# Patient Record
Sex: Female | Born: 1935 | Race: Black or African American | Hispanic: No | Smoking: Former smoker
Health system: Southern US, Community
[De-identification: ages and names within clinical notes are randomized; demographics above are authoritative.]

## PROBLEM LIST (undated history)

## (undated) DIAGNOSIS — E119 Type 2 diabetes mellitus without complications: Secondary | ICD-10-CM

## (undated) DIAGNOSIS — N184 Chronic kidney disease, stage 4 (severe): Secondary | ICD-10-CM

## (undated) DIAGNOSIS — M1990 Unspecified osteoarthritis, unspecified site: Secondary | ICD-10-CM

## (undated) DIAGNOSIS — C801 Malignant (primary) neoplasm, unspecified: Secondary | ICD-10-CM

## (undated) DIAGNOSIS — IMO0002 Reserved for concepts with insufficient information to code with codable children: Secondary | ICD-10-CM

## (undated) DIAGNOSIS — I10 Essential (primary) hypertension: Secondary | ICD-10-CM

## (undated) DIAGNOSIS — E079 Disorder of thyroid, unspecified: Secondary | ICD-10-CM

## (undated) DIAGNOSIS — G4730 Sleep apnea, unspecified: Secondary | ICD-10-CM

## (undated) DIAGNOSIS — F329 Major depressive disorder, single episode, unspecified: Secondary | ICD-10-CM

## (undated) DIAGNOSIS — F32A Depression, unspecified: Secondary | ICD-10-CM

## (undated) HISTORY — DX: Depression, unspecified: F32.A

## (undated) HISTORY — PX: FLEXIBLE SIGMOIDOSCOPY: SHX1649

## (undated) HISTORY — DX: Essential (primary) hypertension: I10

## (undated) HISTORY — DX: Disorder of thyroid, unspecified: E07.9

## (undated) HISTORY — DX: Reserved for concepts with insufficient information to code with codable children: IMO0002

## (undated) HISTORY — PX: COLONOSCOPY: SHX174

## (undated) HISTORY — DX: Major depressive disorder, single episode, unspecified: F32.9

## (undated) HISTORY — DX: Type 2 diabetes mellitus without complications: E11.9

## (undated) HISTORY — PX: CATARACT EXTRACTION: SUR2

## (undated) HISTORY — DX: Chronic kidney disease, stage 4 (severe): N18.4

## (undated) HISTORY — PX: POLYPECTOMY: SHX149

## (undated) HISTORY — DX: Malignant (primary) neoplasm, unspecified: C80.1

## (undated) HISTORY — DX: Sleep apnea, unspecified: G47.30

## (undated) HISTORY — DX: Unspecified osteoarthritis, unspecified site: M19.90

---

## 1997-05-20 ENCOUNTER — Ambulatory Visit (HOSPITAL_COMMUNITY): Admission: RE | Admit: 1997-05-20 | Discharge: 1997-05-20 | Payer: Self-pay | Admitting: Obstetrics and Gynecology

## 1998-01-07 ENCOUNTER — Ambulatory Visit (HOSPITAL_COMMUNITY): Admission: RE | Admit: 1998-01-07 | Discharge: 1998-01-07 | Payer: Self-pay | Admitting: Internal Medicine

## 1998-01-07 ENCOUNTER — Encounter: Payer: Self-pay | Admitting: Internal Medicine

## 1998-03-15 ENCOUNTER — Other Ambulatory Visit: Admission: RE | Admit: 1998-03-15 | Discharge: 1998-03-15 | Payer: Self-pay | Admitting: Obstetrics and Gynecology

## 1998-10-27 ENCOUNTER — Encounter: Payer: Self-pay | Admitting: Obstetrics and Gynecology

## 1998-10-27 ENCOUNTER — Ambulatory Visit (HOSPITAL_COMMUNITY): Admission: RE | Admit: 1998-10-27 | Discharge: 1998-10-27 | Payer: Self-pay | Admitting: Obstetrics and Gynecology

## 1999-03-21 ENCOUNTER — Other Ambulatory Visit: Admission: RE | Admit: 1999-03-21 | Discharge: 1999-03-21 | Payer: Self-pay | Admitting: Obstetrics and Gynecology

## 1999-11-17 ENCOUNTER — Encounter: Admission: RE | Admit: 1999-11-17 | Discharge: 2000-02-15 | Payer: Self-pay | Admitting: Internal Medicine

## 1999-11-23 ENCOUNTER — Encounter: Payer: Self-pay | Admitting: Obstetrics and Gynecology

## 1999-11-23 ENCOUNTER — Ambulatory Visit (HOSPITAL_COMMUNITY): Admission: RE | Admit: 1999-11-23 | Discharge: 1999-11-23 | Payer: Self-pay | Admitting: Obstetrics and Gynecology

## 2000-05-14 ENCOUNTER — Other Ambulatory Visit: Admission: RE | Admit: 2000-05-14 | Discharge: 2000-05-14 | Payer: Self-pay | Admitting: Orthopedic Surgery

## 2000-11-26 ENCOUNTER — Encounter: Payer: Self-pay | Admitting: Obstetrics and Gynecology

## 2000-11-26 ENCOUNTER — Ambulatory Visit (HOSPITAL_COMMUNITY): Admission: RE | Admit: 2000-11-26 | Discharge: 2000-11-26 | Payer: Self-pay | Admitting: Obstetrics and Gynecology

## 2000-12-17 ENCOUNTER — Other Ambulatory Visit: Admission: RE | Admit: 2000-12-17 | Discharge: 2000-12-17 | Payer: Self-pay | Admitting: Obstetrics and Gynecology

## 2001-04-18 ENCOUNTER — Encounter: Payer: Self-pay | Admitting: Internal Medicine

## 2001-04-18 ENCOUNTER — Encounter: Admission: RE | Admit: 2001-04-18 | Discharge: 2001-04-18 | Payer: Self-pay | Admitting: Internal Medicine

## 2001-12-03 ENCOUNTER — Encounter: Payer: Self-pay | Admitting: Obstetrics and Gynecology

## 2001-12-03 ENCOUNTER — Ambulatory Visit (HOSPITAL_COMMUNITY): Admission: RE | Admit: 2001-12-03 | Discharge: 2001-12-03 | Payer: Self-pay | Admitting: Obstetrics and Gynecology

## 2001-12-17 ENCOUNTER — Encounter (INDEPENDENT_AMBULATORY_CARE_PROVIDER_SITE_OTHER): Payer: Self-pay | Admitting: *Deleted

## 2001-12-17 ENCOUNTER — Ambulatory Visit (HOSPITAL_COMMUNITY): Admission: RE | Admit: 2001-12-17 | Discharge: 2001-12-17 | Payer: Self-pay | Admitting: Obstetrics and Gynecology

## 2002-01-30 DIAGNOSIS — C801 Malignant (primary) neoplasm, unspecified: Secondary | ICD-10-CM

## 2002-01-30 HISTORY — DX: Malignant (primary) neoplasm, unspecified: C80.1

## 2002-06-12 ENCOUNTER — Encounter: Admission: RE | Admit: 2002-06-12 | Discharge: 2002-06-12 | Payer: Self-pay | Admitting: *Deleted

## 2002-06-12 ENCOUNTER — Encounter: Payer: Self-pay | Admitting: *Deleted

## 2002-07-08 ENCOUNTER — Other Ambulatory Visit: Admission: RE | Admit: 2002-07-08 | Discharge: 2002-07-08 | Payer: Self-pay | Admitting: Obstetrics and Gynecology

## 2002-07-28 ENCOUNTER — Encounter: Payer: Self-pay | Admitting: Gastroenterology

## 2002-08-05 ENCOUNTER — Encounter: Payer: Self-pay | Admitting: Gastroenterology

## 2002-08-05 ENCOUNTER — Encounter: Admission: RE | Admit: 2002-08-05 | Discharge: 2002-08-05 | Payer: Self-pay | Admitting: Gastroenterology

## 2002-08-26 ENCOUNTER — Ambulatory Visit (HOSPITAL_COMMUNITY): Admission: RE | Admit: 2002-08-26 | Discharge: 2002-08-26 | Payer: Self-pay | Admitting: General Surgery

## 2002-09-26 ENCOUNTER — Ambulatory Visit (HOSPITAL_COMMUNITY): Admission: RE | Admit: 2002-09-26 | Discharge: 2002-09-26 | Payer: Self-pay

## 2002-12-08 ENCOUNTER — Ambulatory Visit (HOSPITAL_COMMUNITY): Admission: RE | Admit: 2002-12-08 | Discharge: 2002-12-08 | Payer: Self-pay | Admitting: Obstetrics and Gynecology

## 2003-07-14 ENCOUNTER — Other Ambulatory Visit: Admission: RE | Admit: 2003-07-14 | Discharge: 2003-07-14 | Payer: Self-pay | Admitting: Obstetrics and Gynecology

## 2003-12-31 ENCOUNTER — Ambulatory Visit (HOSPITAL_COMMUNITY): Admission: RE | Admit: 2003-12-31 | Discharge: 2003-12-31 | Payer: Self-pay | Admitting: Obstetrics and Gynecology

## 2004-09-05 ENCOUNTER — Other Ambulatory Visit: Admission: RE | Admit: 2004-09-05 | Discharge: 2004-09-05 | Payer: Self-pay | Admitting: Obstetrics and Gynecology

## 2004-12-13 ENCOUNTER — Encounter: Admission: RE | Admit: 2004-12-13 | Discharge: 2004-12-13 | Payer: Self-pay | Admitting: Internal Medicine

## 2005-01-10 ENCOUNTER — Ambulatory Visit (HOSPITAL_COMMUNITY): Admission: RE | Admit: 2005-01-10 | Discharge: 2005-01-10 | Payer: Self-pay | Admitting: Obstetrics and Gynecology

## 2005-09-28 ENCOUNTER — Ambulatory Visit: Payer: Self-pay | Admitting: Gastroenterology

## 2005-10-23 ENCOUNTER — Encounter: Payer: Self-pay | Admitting: Gastroenterology

## 2005-10-23 ENCOUNTER — Ambulatory Visit: Payer: Self-pay | Admitting: Gastroenterology

## 2005-10-31 ENCOUNTER — Other Ambulatory Visit: Admission: RE | Admit: 2005-10-31 | Discharge: 2005-10-31 | Payer: Self-pay | Admitting: Obstetrics and Gynecology

## 2006-02-13 ENCOUNTER — Encounter: Admission: RE | Admit: 2006-02-13 | Discharge: 2006-02-13 | Payer: Self-pay | Admitting: Obstetrics and Gynecology

## 2006-02-27 ENCOUNTER — Encounter: Admission: RE | Admit: 2006-02-27 | Discharge: 2006-02-27 | Payer: Self-pay | Admitting: *Deleted

## 2007-03-11 ENCOUNTER — Encounter: Admission: RE | Admit: 2007-03-11 | Discharge: 2007-03-11 | Payer: Self-pay | Admitting: Obstetrics and Gynecology

## 2007-09-30 ENCOUNTER — Other Ambulatory Visit: Admission: RE | Admit: 2007-09-30 | Discharge: 2007-09-30 | Payer: Self-pay | Admitting: Family Medicine

## 2007-11-07 ENCOUNTER — Encounter: Payer: Self-pay | Admitting: Gastroenterology

## 2007-12-09 DIAGNOSIS — K219 Gastro-esophageal reflux disease without esophagitis: Secondary | ICD-10-CM

## 2007-12-09 DIAGNOSIS — C20 Malignant neoplasm of rectum: Secondary | ICD-10-CM

## 2007-12-09 DIAGNOSIS — K449 Diaphragmatic hernia without obstruction or gangrene: Secondary | ICD-10-CM | POA: Insufficient documentation

## 2007-12-10 ENCOUNTER — Ambulatory Visit: Payer: Self-pay | Admitting: Gastroenterology

## 2007-12-10 DIAGNOSIS — J4489 Other specified chronic obstructive pulmonary disease: Secondary | ICD-10-CM | POA: Insufficient documentation

## 2007-12-10 DIAGNOSIS — R195 Other fecal abnormalities: Secondary | ICD-10-CM | POA: Insufficient documentation

## 2007-12-10 DIAGNOSIS — I359 Nonrheumatic aortic valve disorder, unspecified: Secondary | ICD-10-CM | POA: Insufficient documentation

## 2007-12-10 DIAGNOSIS — Z862 Personal history of diseases of the blood and blood-forming organs and certain disorders involving the immune mechanism: Secondary | ICD-10-CM | POA: Insufficient documentation

## 2007-12-10 DIAGNOSIS — R197 Diarrhea, unspecified: Secondary | ICD-10-CM | POA: Insufficient documentation

## 2007-12-10 DIAGNOSIS — K625 Hemorrhage of anus and rectum: Secondary | ICD-10-CM

## 2007-12-10 DIAGNOSIS — I10 Essential (primary) hypertension: Secondary | ICD-10-CM | POA: Insufficient documentation

## 2007-12-10 DIAGNOSIS — J449 Chronic obstructive pulmonary disease, unspecified: Secondary | ICD-10-CM

## 2007-12-10 DIAGNOSIS — G4730 Sleep apnea, unspecified: Secondary | ICD-10-CM | POA: Insufficient documentation

## 2007-12-10 DIAGNOSIS — E039 Hypothyroidism, unspecified: Secondary | ICD-10-CM

## 2007-12-10 LAB — CONVERTED CEMR LAB
Basophils Absolute: 0.1 10*3/uL (ref 0.0–0.1)
Basophils Relative: 1.1 % (ref 0.0–3.0)
Eosinophils Absolute: 0.4 10*3/uL (ref 0.0–0.7)
Eosinophils Relative: 6.9 % — ABNORMAL HIGH (ref 0.0–5.0)
Ferritin: 50.9 ng/mL (ref 10.0–291.0)
Folate: 9 ng/mL
HCT: 37.4 % (ref 36.0–46.0)
Hemoglobin: 12.6 g/dL (ref 12.0–15.0)
Iron: 65 ug/dL (ref 42–145)
Lymphocytes Relative: 33.5 % (ref 12.0–46.0)
MCHC: 33.8 g/dL (ref 30.0–36.0)
MCV: 88.2 fL (ref 78.0–100.0)
Monocytes Absolute: 0.4 10*3/uL (ref 0.1–1.0)
Monocytes Relative: 5.9 % (ref 3.0–12.0)
Neutro Abs: 3.2 10*3/uL (ref 1.4–7.7)
Neutrophils Relative %: 52.6 % (ref 43.0–77.0)
Platelets: 251 10*3/uL (ref 150–400)
RBC: 4.24 M/uL (ref 3.87–5.11)
RDW: 12.9 % (ref 11.5–14.6)
Saturation Ratios: 20 % (ref 20.0–50.0)
Transferrin: 231.9 mg/dL (ref >=212.0)
Vitamin B-12: 491 pg/mL (ref 211–911)
WBC: 6.2 10*3/uL (ref 4.5–10.5)

## 2007-12-13 ENCOUNTER — Encounter: Payer: Self-pay | Admitting: Gastroenterology

## 2007-12-13 ENCOUNTER — Ambulatory Visit: Payer: Self-pay | Admitting: Gastroenterology

## 2007-12-16 ENCOUNTER — Encounter: Payer: Self-pay | Admitting: Gastroenterology

## 2008-01-01 ENCOUNTER — Encounter: Payer: Self-pay | Admitting: Gastroenterology

## 2008-01-14 ENCOUNTER — Ambulatory Visit: Payer: Self-pay | Admitting: Gastroenterology

## 2008-01-14 DIAGNOSIS — K2270 Barrett's esophagus without dysplasia: Secondary | ICD-10-CM | POA: Insufficient documentation

## 2008-04-28 ENCOUNTER — Encounter: Admission: RE | Admit: 2008-04-28 | Discharge: 2008-04-28 | Payer: Self-pay | Admitting: Obstetrics and Gynecology

## 2008-07-31 ENCOUNTER — Encounter: Admission: RE | Admit: 2008-07-31 | Discharge: 2008-07-31 | Payer: Self-pay | Admitting: Family Medicine

## 2009-05-11 ENCOUNTER — Encounter: Admission: RE | Admit: 2009-05-11 | Discharge: 2009-05-11 | Payer: Self-pay | Admitting: Family Medicine

## 2010-06-17 NOTE — Op Note (Signed)
NAME:  Lynn Ray, Lynn Ray                            ACCOUNT NO.:  [id number]   MEDICAL RECORD NO.:  [id number]                   PATIENT TYPE:  AMB   LOCATION:  SDC                                  FACILITY:  [facility]   PHYSICIAN:  Naima A. Dillard, M.D.              DATE OF BIRTH:  08/28/1934   DATE OF PROCEDURE:  12/17/2001  DATE OF DISCHARGE:                                 OPERATIVE REPORT   PREOPERATIVE DIAGNOSES:  Postmenopausal vaginal bleeding.   POSTOPERATIVE DIAGNOSES:  Postmenopausal vaginal bleeding.   PROCEDURE:  1. Dilatation and curettage.  2. Hysteroscopy.   SURGEON:  Naima A. Charlesetta Garibaldi, M.D.   ANESTHESIA:  General, laryngeal mask airway.   IV FLUIDS:  500 cc.   ESTIMATED BLOOD LOSS:  Minimal.   COMPLICATIONS:  None.   FINDINGS:  Atrophic, small uterus that sounded to 6 cm.  There is a  questionable left horn seen on the uterine cavity.  There was atrophic  endometrium noted.   PROCEDURE IN DETAIL:  The patient was taken to the operating room where she  was given general laryngeal mask airway anesthesia.  Placed in the dorsal  lithotomy position and prepped and draped in a normal sterile fashion.  Her  bladder was drained of about 50 cc urine.  A bivalve speculum was placed  into the vagina.  Laminaria was removed.  It was placed yesterday in the  office.  The anterior lip of the cervix was grasped with a single tooth  tenaculum.  The uterus was sounded to 6 cm.  Uterine sounded was placed and  sounded to 6 cm.  The cervix was further dilated with Pratt dilators to 21.  The diagnostic hysteroscope was placed into the uterine cavity.  The right  ostia was seen in the fundus and then to locate the left ostia we kind of  had to make almost a left turn and then a small blinded cavity maybe  consistent with a left uterine horn.  However, the ostia was seen on that  side.  There was no large septum noted, however.  There was atrophic  endometrium.  No polyps.  No  fibroids seen.  The hysteroscope was then  removed.  A sharp curettage was done and minimal curettings were obtained  and sent to pathology.  All instruments were removed from the vagina and  cervix.  Hemostasis was noted with removal of the tenaculum.  Sponge, lap,  and needle counts were correct x2.  The patient went to recovery room in  stable condition.                                               Naima A. Charlesetta Garibaldi, M.D.    NAD/MEDQ  D:  12/17/2001  T:  12/17/2001  Job:  [id number]

## 2010-06-17 NOTE — Op Note (Signed)
   NAME:  Lynn Ray, Lynn Ray                            ACCOUNT NO.:  [id number]   MEDICAL RECORD NO.:  [id number]                   PATIENT TYPE:  OUT   LOCATION:  OMED                                 FACILITY:  [facility]   PHYSICIAN:  Timothy E. Rosana Hoes, M.D.              DATE OF BIRTH:  03-06-1934   DATE OF PROCEDURE:  08/26/2002  DATE OF DISCHARGE:                                 OPERATIVE REPORT   PREOPERATIVE DIAGNOSIS:  Rectal cancer.   POSTOPERATIVE DIAGNOSIS:  Rectal cancer.   PROCEDURE:  Anorectal ultrasonography.   SURGEON:  Timothy E. Rosana Hoes, M.D.   ANESTHESIA:  None.   INDICATIONS FOR PROCEDURE:  Ms. Bezy was seen in the office yesterday with a  diagnosis of rectal cancer from a polyp biopsy made by Dr. Verl Blalock.  This is part of the staging procedure and the patient agrees and  understands. The procedure is carried out in medical bay unit.   DESCRIPTION OF PROCEDURE:  We were using the [employer] medical ultrasonography  unit 2002. The patient was informed, the permit was signed, she was turned  on her left side. The anus was examined visually and digitally and then the  balloon tip was placed on the rectal ultrasound probe and inserted into the  rectum. Careful examination via ultrasonography was carried out. A right  posterior tumor is obvious on ultrasonography as was seen yesterday in the  office. A number of images are made, one copy for the chart, one copy for my  files. It does show an approximate 1 cm thickening of the mucosa in the  right posterior position. On several images, the submucosa appears intact  but on other images the submucosa is broken and in fact is continuous with  what I believe to be an enlarged lymph node just to the left of the midline  in the perirectal fat. The patient and family are informed and followup will  be made appropriately.                                               Timothy E. Rosana Hoes, M.D.    TED/MEDQ  D:  08/26/2002  T:   08/27/2002  Job:  [id number]   cc:   Loralee Pacas. Sharlett Iles, M.D. [facility]

## 2010-06-17 NOTE — H&P (Signed)
NAME:  Lynn Ray, Lynn Ray                            ACCOUNT NO.:  [id number]   MEDICAL RECORD NO.:  [id number]                   PATIENT TYPE:  AMB   LOCATION:  SDC                                  FACILITY:  [facility]   PHYSICIAN:  Naima A. Dillard, M.D.              DATE OF BIRTH:  Jul 25, 1934   DATE OF ADMISSION:  DATE OF DISCHARGE:                                HISTORY & PHYSICAL   CHIEF COMPLAINT:  The patient is a 76 year old, gravida 2, para 2, who is  having a D&C hysteroscopy secondary to postmenopausal vaginal bleeding.   HISTORY OF PRESENT ILLNESS:  The patient first presented to me with  menopausal symptoms and feelings of feeling depressed.  The patient, after  going the __________  initiative, was placed on Effexor, and her depression  improved.  However, she had a DEXA scan which was significant for osteopenia  and still had the hot flushes and vaginal dryness, and she decided to go on  femhrt.  The patient was on femhrt since February 2003.  She then came in  July 2003 with some spotting.  Endometrial biopsy was attempted twice, and  the second endometrial biopsy just showed scant proliferative endometrium.  The patient had an ultrasound in December 2002, which showed a normal-sized  uterus measuring 6.5 x 3.1 x 3.6 and endometrial stripe of 5.7 mm.  I was  unable to see either ovary because of the thickened endometrial stipe and  menopause.  The patient has agreed to proceed with [facility] hysteroscopy even  though endometrial biopsy was normal.  The patient was also given the option  of observation but said that she would rather have __________  with surgery.  The patient since that one time, has not had any further vaginal bleeding.   PAST MEDICAL HISTORY:  1. Hypothyroidism.  2. Gastroesophageal reflux disease.   PAST SURGICAL HISTORY:  1. Removal of a hand cyst.  2. Tubal ligation.   MEDICATIONS:  Synthroid, Prilosec, femhrt, and Effexor.   HABITS:  The patient smokes  about a half a pack per day for 30 years.   ALLERGIES:  The patient says that she is allergic to ASPIRIN.   FAMILY HISTORY:  Significant for heart disease and high blood pressure.   SOCIAL HISTORY:  Significant for tobacco use.  No alcohol or illicit drug  use.   PAST GYNECOLOGIC HISTORY:  The patient is sexually active with her husband.  Denies any history of any sexually transmitted diseases.  Has no history of  abnormal Pap smear.   LABORATORY DATA:  Mammogram was negative.  Endometrial biopsy and ultrasound  is as above.  The patient has a DEXA scan in November 2002, which showed  osteopenia.  TSH was normal November 2002.  HIV, syphilis were all negative.  In November 2002, gonorrhea and Chlamydia both were negative, and her last  Pap smear done November  2002, was within normal limits.   ASSESSMENT:  Postmenopausal vaginal bleeding with thickened endometrial  stripe and scant sampling with endometrial biopsy.   PLAN:  The patient advised to have D&C hysteroscopy.  Risks of bleeding,  infection, damage to the uterus, and perforation of the uterus was discussed  with the patient in detail.  The patient still desires to proceed with D&C  hysteroscopy __________  told the patient, and patient was told that her  biopsy and other scan was found to be negative, we could do observation, but  she still desired to proceed with a D&C hysteroscopy, one laminaria was  placed today and will be removed for the hysteroscopy tomorrow.                                               Naima A. Charlesetta Garibaldi, M.D.    NAD/MEDQ  D:  12/16/2001  T:  12/16/2001  Job:  [id number]

## 2010-06-21 ENCOUNTER — Other Ambulatory Visit: Payer: Self-pay | Admitting: Family Medicine

## 2010-06-21 DIAGNOSIS — Z1231 Encounter for screening mammogram for malignant neoplasm of breast: Secondary | ICD-10-CM

## 2010-06-29 ENCOUNTER — Ambulatory Visit: Payer: Self-pay

## 2010-06-30 ENCOUNTER — Ambulatory Visit: Payer: Self-pay

## 2010-07-05 ENCOUNTER — Ambulatory Visit
Admission: RE | Admit: 2010-07-05 | Discharge: 2010-07-05 | Disposition: A | Discharge status: Home / Self Care | Payer: Medicare Other | Source: Ambulatory Visit | Attending: Family Medicine | Admitting: Family Medicine

## 2010-07-05 DIAGNOSIS — Z1231 Encounter for screening mammogram for malignant neoplasm of breast: Secondary | ICD-10-CM

## 2010-11-10 ENCOUNTER — Ambulatory Visit (AMBULATORY_SURGERY_CENTER): Payer: Medicare Other | Admitting: *Deleted

## 2010-11-10 VITALS — Ht 59.0 in | Wt 192.9 lb

## 2010-11-10 DIAGNOSIS — Z85038 Personal history of other malignant neoplasm of large intestine: Secondary | ICD-10-CM

## 2010-11-10 MED ORDER — PEG-KCL-NACL-NASULF-NA ASC-C 100 G PO SOLR: 1.0000 | Freq: Once | ORAL | Status: DC

## 2010-11-18 ENCOUNTER — Encounter: Payer: Self-pay | Admitting: Gastroenterology

## 2010-11-18 ENCOUNTER — Ambulatory Visit (AMBULATORY_SURGERY_CENTER): Payer: Medicare Other | Admitting: Gastroenterology

## 2010-11-18 VITALS — BP 134/74 | HR 69 | Temp 98.4°F | Resp 25 | Ht 59.5 in | Wt 192.0 lb

## 2010-11-18 DIAGNOSIS — K5730 Diverticulosis of large intestine without perforation or abscess without bleeding: Secondary | ICD-10-CM | POA: Insufficient documentation

## 2010-11-18 DIAGNOSIS — Z1211 Encounter for screening for malignant neoplasm of colon: Secondary | ICD-10-CM

## 2010-11-18 DIAGNOSIS — Z85038 Personal history of other malignant neoplasm of large intestine: Secondary | ICD-10-CM | POA: Insufficient documentation

## 2010-11-18 MED ORDER — SODIUM CHLORIDE 0.9 % IV SOLN: 500.0000 mL | INTRAVENOUS | Status: DC

## 2010-11-18 NOTE — Progress Notes (Signed)
PT TOLERATED THE COLON EXAM VERY WELL. MAW

## 2010-11-21 ENCOUNTER — Telehealth: Payer: Self-pay | Admitting: *Deleted

## 2010-11-21 NOTE — Telephone Encounter (Signed)

## 2010-12-05 ENCOUNTER — Other Ambulatory Visit (HOSPITAL_COMMUNITY)
Admission: RE | Admit: 2010-12-05 | Discharge: 2010-12-05 | Disposition: A | Discharge status: Home / Self Care | Payer: Medicare Other | Source: Ambulatory Visit | Attending: Family Medicine | Admitting: Family Medicine

## 2010-12-05 ENCOUNTER — Other Ambulatory Visit: Payer: Self-pay

## 2010-12-05 DIAGNOSIS — Z124 Encounter for screening for malignant neoplasm of cervix: Secondary | ICD-10-CM | POA: Insufficient documentation

## 2011-01-20 ENCOUNTER — Encounter (HOSPITAL_COMMUNITY): Payer: Self-pay | Admitting: Nurse Practitioner

## 2011-01-20 ENCOUNTER — Emergency Department (HOSPITAL_COMMUNITY)
Admission: EM | Admit: 2011-01-20 | Discharge: 2011-01-20 | Disposition: A | Discharge status: Home / Self Care | Payer: Medicare Other | Attending: Emergency Medicine | Admitting: Emergency Medicine

## 2011-01-20 ENCOUNTER — Emergency Department (HOSPITAL_COMMUNITY): Payer: Medicare Other

## 2011-01-20 DIAGNOSIS — Z85038 Personal history of other malignant neoplasm of large intestine: Secondary | ICD-10-CM | POA: Insufficient documentation

## 2011-01-20 DIAGNOSIS — F3289 Other specified depressive episodes: Secondary | ICD-10-CM | POA: Insufficient documentation

## 2011-01-20 DIAGNOSIS — F17200 Nicotine dependence, unspecified, uncomplicated: Secondary | ICD-10-CM | POA: Insufficient documentation

## 2011-01-20 DIAGNOSIS — R110 Nausea: Secondary | ICD-10-CM | POA: Insufficient documentation

## 2011-01-20 DIAGNOSIS — M129 Arthropathy, unspecified: Secondary | ICD-10-CM | POA: Insufficient documentation

## 2011-01-20 DIAGNOSIS — N200 Calculus of kidney: Secondary | ICD-10-CM | POA: Insufficient documentation

## 2011-01-20 DIAGNOSIS — R109 Unspecified abdominal pain: Secondary | ICD-10-CM | POA: Insufficient documentation

## 2011-01-20 DIAGNOSIS — E039 Hypothyroidism, unspecified: Secondary | ICD-10-CM | POA: Insufficient documentation

## 2011-01-20 DIAGNOSIS — I10 Essential (primary) hypertension: Secondary | ICD-10-CM | POA: Insufficient documentation

## 2011-01-20 DIAGNOSIS — F329 Major depressive disorder, single episode, unspecified: Secondary | ICD-10-CM | POA: Insufficient documentation

## 2011-01-20 DIAGNOSIS — R10819 Abdominal tenderness, unspecified site: Secondary | ICD-10-CM | POA: Insufficient documentation

## 2011-01-20 LAB — URINALYSIS, ROUTINE W REFLEX MICROSCOPIC
Bilirubin Urine: NEGATIVE
Glucose, UA: NEGATIVE mg/dL
Hgb urine dipstick: NEGATIVE
Ketones, ur: NEGATIVE mg/dL
Leukocytes, UA: NEGATIVE
Nitrite: NEGATIVE
Protein, ur: NEGATIVE mg/dL
Specific Gravity, Urine: 1.022 (ref 1.005–1.030)
Urobilinogen, UA: 0.2 mg/dL (ref 0.0–1.0)
pH: 7 (ref 5.0–8.0)

## 2011-01-20 LAB — LIPASE, BLOOD: Lipase: 40 U/L (ref 11–59)

## 2011-01-20 LAB — CBC
HCT: 37.8 % (ref 36.0–46.0)
Hemoglobin: 12.5 g/dL (ref 12.0–15.0)
MCH: 28.2 pg (ref 26.0–34.0)
MCHC: 33.1 g/dL (ref 30.0–36.0)
MCV: 85.1 fL (ref 78.0–100.0)
Platelets: 281 K/uL (ref 150–400)
RBC: 4.44 MIL/uL (ref 3.87–5.11)
RDW: 14 % (ref 11.5–15.5)
WBC: 8.3 10*3/uL (ref 4.0–10.5)

## 2011-01-20 LAB — DIFFERENTIAL
Basophils Absolute: 0 10*3/uL (ref 0.0–0.1)
Basophils Relative: 1 % (ref 0–1)
Eosinophils Absolute: 0.1 K/uL (ref 0.0–0.7)
Eosinophils Relative: 1 % (ref 0–5)
Lymphocytes Relative: 15 % (ref 12–46)
Lymphs Abs: 1.3 K/uL (ref 0.7–4.0)
Monocytes Absolute: 0.3 10*3/uL (ref 0.1–1.0)
Monocytes Relative: 4 % (ref 3–12)
Neutro Abs: 6.6 K/uL (ref 1.7–7.7)
Neutrophils Relative %: 79 % — ABNORMAL HIGH (ref 43–77)

## 2011-01-20 LAB — COMPREHENSIVE METABOLIC PANEL WITH GFR
ALT: 9 U/L (ref 0–35)
Albumin: 3.8 g/dL (ref 3.5–5.2)
Alkaline Phosphatase: 97 U/L (ref 39–117)
BUN: 19 mg/dL (ref 6–23)
Chloride: 103 meq/L (ref 96–112)
Potassium: 3.5 meq/L (ref 3.5–5.1)
Sodium: 136 meq/L (ref 135–145)
Total Bilirubin: 0.2 mg/dL — ABNORMAL LOW (ref 0.3–1.2)
Total Protein: 7.6 g/dL (ref 6.0–8.3)

## 2011-01-20 LAB — COMPREHENSIVE METABOLIC PANEL
AST: 13 U/L (ref 0–37)
CO2: 23 mEq/L (ref 19–32)
Calcium: 10.5 mg/dL (ref 8.4–10.5)
Creatinine, Ser: 1.37 mg/dL — ABNORMAL HIGH (ref 0.50–1.10)
GFR calc Af Amer: 43 mL/min — ABNORMAL LOW (ref >90)
GFR calc non Af Amer: 37 mL/min — ABNORMAL LOW (ref >90)
Glucose, Bld: 119 mg/dL — ABNORMAL HIGH (ref 70–99)

## 2011-01-20 MED ORDER — SODIUM CHLORIDE 0.9 % IV SOLN
Freq: Once | INTRAVENOUS | Status: AC
  Administered 2011-01-20: 15:00:00 via INTRAVENOUS

## 2011-01-20 MED ORDER — FENTANYL CITRATE 0.05 MG/ML IJ SOLN
50.0000 ug | Freq: Once | INTRAMUSCULAR | Status: AC
  Administered 2011-01-20: 50 ug via INTRAVENOUS
  Filled 2011-01-20: qty 2

## 2011-01-20 MED ORDER — ONDANSETRON HCL 4 MG/2ML IJ SOLN
4.0000 mg | Freq: Once | INTRAMUSCULAR | Status: AC
  Administered 2011-01-20: 4 mg via INTRAVENOUS
  Filled 2011-01-20: qty 2

## 2011-01-20 MED ORDER — IOHEXOL 300 MG/ML  SOLN
100.0000 mL | Freq: Once | INTRAMUSCULAR | Status: AC | PRN
  Administered 2011-01-20: 100 mL via INTRAVENOUS

## 2011-01-20 MED ORDER — HYDROCODONE-ACETAMINOPHEN 5-325 MG PO TABS: 1.0000 | ORAL_TABLET | ORAL | Status: AC | PRN

## 2011-01-20 MED ORDER — PROMETHAZINE HCL 25 MG PO TABS: 25.0000 mg | ORAL_TABLET | Freq: Four times a day (QID) | ORAL | Status: AC | PRN

## 2011-01-20 NOTE — ED Notes (Signed)
Pt reports RLQ abd pain onset this am, pain radiates into R flank and RUQ. Also nausea. No bowel/bladder changes

## 2011-01-20 NOTE — ED Notes (Signed)
No change in pt condition. Contineus to deny pain. Awaiting ct

## 2011-01-20 NOTE — ED Notes (Signed)
Pt vomited x 1 in CT. Medicated as listed. Pt reports r side abd pain 1/10 at this time. States "it hurts just bad enough to notice."

## 2011-01-20 NOTE — ED Notes (Signed)
Introduced myself to [employer].  [place].

## 2011-01-20 NOTE — ED Provider Notes (Signed)
Medical screening examination/treatment/procedure(s) were performed by non-physician practitioner and as supervising physician I was immediately available for consultation/collaboration.   Lezlie Octave, MD 01/20/11 2002

## 2011-01-20 NOTE — ED Notes (Signed)
Pt denies pain. Resting quietly at this time. NAD noted

## 2011-01-20 NOTE — ED Provider Notes (Signed)
History     CSN: [id number]  Arrival date & time 01/20/11  1338   First MD Initiated Contact with Patient 01/20/11 1442      Chief Complaint  Patient presents with  . Abdominal Pain    (Consider location/radiation/quality/duration/timing/severity/associated sxs/prior treatment) HPI Comments: Pt was in usual state of health as of this AM.  She developed rather sudden pain in right lower quadrant going into right suprapubic region associated with nausea.  No fevers, chills.  No diarrhea, black tarry stools, vomiting.  Also, no dysuria, hematuria, frequency and no vaginal symptoms.  No prior h/o similar.  No CP or back pain. She did not take any meds PTA.  She denies skin lesion, itching or trauma.  She denies any sig alcohol use, does smoke.    Patient is a 76 y.o. female presenting with abdominal pain. The history is provided by the patient and the spouse.  Abdominal Pain The primary symptoms of the illness include abdominal pain and nausea. The primary symptoms of the illness do not include vomiting or diarrhea.  Symptoms associated with the illness do not include constipation or back pain.    Past Medical History  Diagnosis Date  . Arthritis   . Cancer     colon  . Cataract   . Depression   . Hypertension   . Chiari malformation   . Thyroid disease     hyporthyroid  . Sleep apnea     no CPAP    Past Surgical History  Procedure Date  . Colonoscopy   . Flexible sigmoidoscopy   . Polypectomy   . Chiari malformation     surgery    Family History  Problem Relation Age of Onset  . Colon cancer Neg Hx   . Esophageal cancer Neg Hx   . Stomach cancer Neg Hx     History  Substance Use Topics  . Smoking status: Current Everyday Smoker -- 0.2 packs/day  . Smokeless tobacco: Not on file  . Alcohol Use: No    OB History    Grav Para Term Preterm Abortions TAB SAB Ect Mult Living                  Review of Systems  Constitutional: Negative.   Gastrointestinal:  Positive for nausea and abdominal pain. Negative for vomiting, diarrhea, constipation and blood in stool.  Genitourinary: Negative.   Musculoskeletal: Negative for back pain.  All other systems reviewed and are negative.    Allergies  Aspirin  Home Medications   Current Outpatient Rx  Name Route Sig Dispense Refill  . HYDROCHLOROTHIAZIDE 50 MG PO TABS  1 tablet Daily.    Marland Kitchen LEVOTHYROXINE SODIUM 88 MCG PO TABS Oral Take 88 mcg by mouth daily.      Marland Kitchen OVER THE COUNTER MEDICATION Oral Take 1 capsule by mouth daily. coffee bean extract capsule     . TOPROL XL 50 MG PO TB24  1 tablet Daily.      BP 160/64  Pulse 65  Temp(Src) 97.7 F (36.5 C) (Oral)  Resp 18  Ht 4\' 11"  (1.499 m)  Wt 190 lb (86.183 kg)  BMI 38.38 kg/m2  SpO2 100%  Physical Exam  Nursing note and vitals reviewed. Constitutional: She is oriented to person, place, and time. She appears well-developed and well-nourished.  HENT:  Head: Normocephalic.  Eyes: Pupils are equal, round, and reactive to light. No scleral icterus.  Neck: Normal range of motion. Neck supple.  Cardiovascular: Normal rate.  Pulmonary/Chest: Effort normal.  Abdominal: Soft. Normal appearance. She exhibits no mass. There is no hepatosplenomegaly. There is tenderness. There is no rebound, no guarding and no CVA tenderness. No hernia.  Neurological: She is oriented to person, place, and time.  Skin: Skin is warm and dry. No rash noted.    ED Course  Procedures (including critical care time)  Labs Reviewed  DIFFERENTIAL - Abnormal; Notable for the following:    Neutrophils Relative 79 (*)    All other components within normal limits  URINALYSIS, ROUTINE W REFLEX MICROSCOPIC  CBC  COMPREHENSIVE METABOLIC PANEL  LIPASE, BLOOD   No results found.   No diagnosis found.    MDM  IVF's, IV analagesics.  Labs including UA, CBC, CMP and lipase are ordered.  CT will be ordered, probably non contrast as I suspect this may be ureteral colic.   PCP is with Sadie Haber at [facility].        3:41 PM Pt's pain somewhat improved.  WBC is ok, Hgb is ok.  CT and other labs, UA are pending.  Signed out to NP [facility] in CDU and Dr. Thad Ranger.    Saddie Benders. Taksh Hjort, MD 01/20/11 [phone]

## 2011-01-20 NOTE — ED Provider Notes (Signed)
Radiology results reviewed and discussed with Dr. Thad Ranger and with patient.  2 mm distal uretal stone on right with mild hydronephrosis.  Patient will be discharged home with rx for analgesia and anti-emetic, with urology follow-up.   Norman Herrlich, NP 01/20/11 1929

## 2011-01-31 HISTORY — PX: OTHER SURGICAL HISTORY: SHX169

## 2011-06-27 ENCOUNTER — Other Ambulatory Visit: Payer: Self-pay | Admitting: Family Medicine

## 2011-06-27 DIAGNOSIS — Z1231 Encounter for screening mammogram for malignant neoplasm of breast: Secondary | ICD-10-CM

## 2011-07-24 ENCOUNTER — Ambulatory Visit: Payer: Medicare Other

## 2011-08-02 ENCOUNTER — Ambulatory Visit: Payer: Medicare Other

## 2011-08-17 DIAGNOSIS — I10 Essential (primary) hypertension: Secondary | ICD-10-CM | POA: Diagnosis not present

## 2011-08-17 DIAGNOSIS — Z79899 Other long term (current) drug therapy: Secondary | ICD-10-CM | POA: Diagnosis not present

## 2011-08-17 DIAGNOSIS — E039 Hypothyroidism, unspecified: Secondary | ICD-10-CM | POA: Diagnosis not present

## 2011-08-17 DIAGNOSIS — B354 Tinea corporis: Secondary | ICD-10-CM | POA: Diagnosis not present

## 2011-08-17 DIAGNOSIS — R7301 Impaired fasting glucose: Secondary | ICD-10-CM | POA: Diagnosis not present

## 2011-08-17 DIAGNOSIS — D649 Anemia, unspecified: Secondary | ICD-10-CM | POA: Diagnosis not present

## 2011-08-17 DIAGNOSIS — E782 Mixed hyperlipidemia: Secondary | ICD-10-CM | POA: Diagnosis not present

## 2011-08-17 DIAGNOSIS — G4730 Sleep apnea, unspecified: Secondary | ICD-10-CM | POA: Diagnosis not present

## 2011-08-24 ENCOUNTER — Ambulatory Visit
Admission: RE | Admit: 2011-08-24 | Discharge: 2011-08-24 | Disposition: A | Discharge status: Home / Self Care | Payer: Medicare Other | Source: Ambulatory Visit | Attending: Family Medicine | Admitting: Family Medicine

## 2011-08-24 DIAGNOSIS — Z1231 Encounter for screening mammogram for malignant neoplasm of breast: Secondary | ICD-10-CM | POA: Diagnosis not present

## 2011-12-25 DIAGNOSIS — I10 Essential (primary) hypertension: Secondary | ICD-10-CM | POA: Diagnosis not present

## 2011-12-25 DIAGNOSIS — D649 Anemia, unspecified: Secondary | ICD-10-CM | POA: Diagnosis not present

## 2011-12-25 DIAGNOSIS — Z23 Encounter for immunization: Secondary | ICD-10-CM | POA: Diagnosis not present

## 2011-12-25 DIAGNOSIS — M899 Disorder of bone, unspecified: Secondary | ICD-10-CM | POA: Diagnosis not present

## 2011-12-25 DIAGNOSIS — Z79899 Other long term (current) drug therapy: Secondary | ICD-10-CM | POA: Diagnosis not present

## 2011-12-25 DIAGNOSIS — M949 Disorder of cartilage, unspecified: Secondary | ICD-10-CM | POA: Diagnosis not present

## 2011-12-25 DIAGNOSIS — E039 Hypothyroidism, unspecified: Secondary | ICD-10-CM | POA: Diagnosis not present

## 2011-12-25 DIAGNOSIS — E119 Type 2 diabetes mellitus without complications: Secondary | ICD-10-CM | POA: Diagnosis not present

## 2011-12-25 DIAGNOSIS — E559 Vitamin D deficiency, unspecified: Secondary | ICD-10-CM | POA: Diagnosis not present

## 2012-02-08 DIAGNOSIS — Z79899 Other long term (current) drug therapy: Secondary | ICD-10-CM | POA: Diagnosis not present

## 2012-02-28 DIAGNOSIS — E119 Type 2 diabetes mellitus without complications: Secondary | ICD-10-CM | POA: Diagnosis not present

## 2012-02-28 DIAGNOSIS — H04129 Dry eye syndrome of unspecified lacrimal gland: Secondary | ICD-10-CM | POA: Diagnosis not present

## 2012-02-28 DIAGNOSIS — H2510 Age-related nuclear cataract, unspecified eye: Secondary | ICD-10-CM | POA: Diagnosis not present

## 2012-03-04 DIAGNOSIS — H2510 Age-related nuclear cataract, unspecified eye: Secondary | ICD-10-CM | POA: Diagnosis not present

## 2012-03-18 DIAGNOSIS — H269 Unspecified cataract: Secondary | ICD-10-CM | POA: Diagnosis not present

## 2012-03-18 DIAGNOSIS — H2510 Age-related nuclear cataract, unspecified eye: Secondary | ICD-10-CM | POA: Diagnosis not present

## 2012-03-20 DIAGNOSIS — B356 Tinea cruris: Secondary | ICD-10-CM | POA: Diagnosis not present

## 2012-03-20 DIAGNOSIS — J019 Acute sinusitis, unspecified: Secondary | ICD-10-CM | POA: Diagnosis not present

## 2012-06-25 DIAGNOSIS — E119 Type 2 diabetes mellitus without complications: Secondary | ICD-10-CM | POA: Diagnosis not present

## 2012-06-25 DIAGNOSIS — D509 Iron deficiency anemia, unspecified: Secondary | ICD-10-CM | POA: Diagnosis not present

## 2012-06-25 DIAGNOSIS — M949 Disorder of cartilage, unspecified: Secondary | ICD-10-CM | POA: Diagnosis not present

## 2012-06-25 DIAGNOSIS — K219 Gastro-esophageal reflux disease without esophagitis: Secondary | ICD-10-CM | POA: Diagnosis not present

## 2012-06-25 DIAGNOSIS — Z79899 Other long term (current) drug therapy: Secondary | ICD-10-CM | POA: Diagnosis not present

## 2012-06-25 DIAGNOSIS — M899 Disorder of bone, unspecified: Secondary | ICD-10-CM | POA: Diagnosis not present

## 2012-06-25 DIAGNOSIS — E039 Hypothyroidism, unspecified: Secondary | ICD-10-CM | POA: Diagnosis not present

## 2012-06-25 DIAGNOSIS — E782 Mixed hyperlipidemia: Secondary | ICD-10-CM | POA: Diagnosis not present

## 2012-06-25 DIAGNOSIS — I10 Essential (primary) hypertension: Secondary | ICD-10-CM | POA: Diagnosis not present

## 2012-06-25 DIAGNOSIS — E559 Vitamin D deficiency, unspecified: Secondary | ICD-10-CM | POA: Diagnosis not present

## 2012-09-19 ENCOUNTER — Other Ambulatory Visit: Payer: Self-pay

## 2012-09-19 DIAGNOSIS — Z1231 Encounter for screening mammogram for malignant neoplasm of breast: Secondary | ICD-10-CM

## 2012-09-20 ENCOUNTER — Other Ambulatory Visit: Payer: Self-pay | Admitting: Family Medicine

## 2012-09-20 ENCOUNTER — Ambulatory Visit
Admission: RE | Admit: 2012-09-20 | Discharge: 2012-09-20 | Disposition: A | Discharge status: Home / Self Care | Payer: Medicare Other | Source: Ambulatory Visit | Attending: Family Medicine | Admitting: Family Medicine

## 2012-09-20 DIAGNOSIS — M545 Low back pain, unspecified: Secondary | ICD-10-CM | POA: Diagnosis not present

## 2012-09-20 DIAGNOSIS — M47817 Spondylosis without myelopathy or radiculopathy, lumbosacral region: Secondary | ICD-10-CM | POA: Diagnosis not present

## 2012-10-16 ENCOUNTER — Ambulatory Visit: Payer: Medicare Other

## 2012-12-04 ENCOUNTER — Ambulatory Visit
Admission: RE | Admit: 2012-12-04 | Discharge: 2012-12-04 | Disposition: A | Discharge status: Home / Self Care | Payer: Medicare Other | Source: Ambulatory Visit

## 2012-12-04 DIAGNOSIS — Z1231 Encounter for screening mammogram for malignant neoplasm of breast: Secondary | ICD-10-CM

## 2012-12-18 DIAGNOSIS — H905 Unspecified sensorineural hearing loss: Secondary | ICD-10-CM | POA: Diagnosis not present

## 2012-12-18 DIAGNOSIS — H903 Sensorineural hearing loss, bilateral: Secondary | ICD-10-CM | POA: Diagnosis not present

## 2013-04-24 ENCOUNTER — Other Ambulatory Visit: Payer: Self-pay | Admitting: Family Medicine

## 2013-04-24 DIAGNOSIS — N898 Other specified noninflammatory disorders of vagina: Secondary | ICD-10-CM | POA: Diagnosis not present

## 2013-04-24 DIAGNOSIS — M8580 Other specified disorders of bone density and structure, unspecified site: Secondary | ICD-10-CM

## 2013-04-24 DIAGNOSIS — M949 Disorder of cartilage, unspecified: Secondary | ICD-10-CM | POA: Diagnosis not present

## 2013-04-24 DIAGNOSIS — E039 Hypothyroidism, unspecified: Secondary | ICD-10-CM | POA: Diagnosis not present

## 2013-04-24 DIAGNOSIS — Z1211 Encounter for screening for malignant neoplasm of colon: Secondary | ICD-10-CM | POA: Diagnosis not present

## 2013-04-24 DIAGNOSIS — D509 Iron deficiency anemia, unspecified: Secondary | ICD-10-CM | POA: Diagnosis not present

## 2013-04-24 DIAGNOSIS — M899 Disorder of bone, unspecified: Secondary | ICD-10-CM | POA: Diagnosis not present

## 2013-04-24 DIAGNOSIS — E119 Type 2 diabetes mellitus without complications: Secondary | ICD-10-CM | POA: Diagnosis not present

## 2013-04-24 DIAGNOSIS — Z0000 Encounter for general adult medical examination without abnormal findings: Secondary | ICD-10-CM | POA: Diagnosis not present

## 2013-04-24 DIAGNOSIS — I10 Essential (primary) hypertension: Secondary | ICD-10-CM | POA: Diagnosis not present

## 2013-04-25 ENCOUNTER — Other Ambulatory Visit: Payer: Self-pay | Admitting: Family Medicine

## 2013-04-25 DIAGNOSIS — Z1231 Encounter for screening mammogram for malignant neoplasm of breast: Secondary | ICD-10-CM

## 2013-04-30 DIAGNOSIS — M899 Disorder of bone, unspecified: Secondary | ICD-10-CM | POA: Diagnosis not present

## 2013-04-30 DIAGNOSIS — E559 Vitamin D deficiency, unspecified: Secondary | ICD-10-CM | POA: Diagnosis not present

## 2013-04-30 DIAGNOSIS — D509 Iron deficiency anemia, unspecified: Secondary | ICD-10-CM | POA: Diagnosis not present

## 2013-04-30 DIAGNOSIS — E119 Type 2 diabetes mellitus without complications: Secondary | ICD-10-CM | POA: Diagnosis not present

## 2013-04-30 DIAGNOSIS — E785 Hyperlipidemia, unspecified: Secondary | ICD-10-CM | POA: Diagnosis not present

## 2013-04-30 DIAGNOSIS — E039 Hypothyroidism, unspecified: Secondary | ICD-10-CM | POA: Diagnosis not present

## 2013-05-14 DIAGNOSIS — Z780 Asymptomatic menopausal state: Secondary | ICD-10-CM | POA: Diagnosis not present

## 2013-05-19 DIAGNOSIS — R7989 Other specified abnormal findings of blood chemistry: Secondary | ICD-10-CM | POA: Diagnosis not present

## 2013-06-05 DIAGNOSIS — E039 Hypothyroidism, unspecified: Secondary | ICD-10-CM | POA: Diagnosis not present

## 2013-06-05 DIAGNOSIS — E119 Type 2 diabetes mellitus without complications: Secondary | ICD-10-CM | POA: Diagnosis not present

## 2013-07-03 DIAGNOSIS — I129 Hypertensive chronic kidney disease with stage 1 through stage 4 chronic kidney disease, or unspecified chronic kidney disease: Secondary | ICD-10-CM | POA: Diagnosis not present

## 2013-07-03 DIAGNOSIS — N183 Chronic kidney disease, stage 3 unspecified: Secondary | ICD-10-CM | POA: Diagnosis not present

## 2013-07-09 DIAGNOSIS — H26499 Other secondary cataract, unspecified eye: Secondary | ICD-10-CM | POA: Diagnosis not present

## 2013-07-09 DIAGNOSIS — E119 Type 2 diabetes mellitus without complications: Secondary | ICD-10-CM | POA: Diagnosis not present

## 2013-07-09 DIAGNOSIS — H04129 Dry eye syndrome of unspecified lacrimal gland: Secondary | ICD-10-CM | POA: Diagnosis not present

## 2013-07-09 DIAGNOSIS — E0500 Thyrotoxicosis with diffuse goiter without thyrotoxic crisis or storm: Secondary | ICD-10-CM | POA: Diagnosis not present

## 2013-07-09 DIAGNOSIS — H05249 Constant exophthalmos, unspecified eye: Secondary | ICD-10-CM | POA: Diagnosis not present

## 2013-07-09 DIAGNOSIS — H2510 Age-related nuclear cataract, unspecified eye: Secondary | ICD-10-CM | POA: Diagnosis not present

## 2013-07-09 DIAGNOSIS — Z961 Presence of intraocular lens: Secondary | ICD-10-CM | POA: Diagnosis not present

## 2013-08-18 DIAGNOSIS — Z79899 Other long term (current) drug therapy: Secondary | ICD-10-CM | POA: Diagnosis not present

## 2013-08-18 DIAGNOSIS — E1129 Type 2 diabetes mellitus with other diabetic kidney complication: Secondary | ICD-10-CM | POA: Diagnosis not present

## 2013-08-18 DIAGNOSIS — N183 Chronic kidney disease, stage 3 unspecified: Secondary | ICD-10-CM | POA: Diagnosis not present

## 2013-08-18 DIAGNOSIS — E785 Hyperlipidemia, unspecified: Secondary | ICD-10-CM | POA: Diagnosis not present

## 2013-08-18 DIAGNOSIS — D509 Iron deficiency anemia, unspecified: Secondary | ICD-10-CM | POA: Diagnosis not present

## 2013-08-18 DIAGNOSIS — M899 Disorder of bone, unspecified: Secondary | ICD-10-CM | POA: Diagnosis not present

## 2013-08-18 DIAGNOSIS — E039 Hypothyroidism, unspecified: Secondary | ICD-10-CM | POA: Diagnosis not present

## 2013-10-07 DIAGNOSIS — N183 Chronic kidney disease, stage 3 unspecified: Secondary | ICD-10-CM | POA: Diagnosis not present

## 2013-10-09 DIAGNOSIS — I129 Hypertensive chronic kidney disease with stage 1 through stage 4 chronic kidney disease, or unspecified chronic kidney disease: Secondary | ICD-10-CM | POA: Diagnosis not present

## 2013-10-09 DIAGNOSIS — E559 Vitamin D deficiency, unspecified: Secondary | ICD-10-CM | POA: Diagnosis not present

## 2013-10-09 DIAGNOSIS — N183 Chronic kidney disease, stage 3 unspecified: Secondary | ICD-10-CM | POA: Diagnosis not present

## 2013-10-09 DIAGNOSIS — M948X9 Other specified disorders of cartilage, unspecified sites: Secondary | ICD-10-CM | POA: Diagnosis not present

## 2013-11-05 DIAGNOSIS — I10 Essential (primary) hypertension: Secondary | ICD-10-CM | POA: Diagnosis not present

## 2013-11-05 DIAGNOSIS — E785 Hyperlipidemia, unspecified: Secondary | ICD-10-CM | POA: Diagnosis not present

## 2013-11-05 DIAGNOSIS — Z23 Encounter for immunization: Secondary | ICD-10-CM | POA: Diagnosis not present

## 2013-11-05 DIAGNOSIS — E119 Type 2 diabetes mellitus without complications: Secondary | ICD-10-CM | POA: Diagnosis not present

## 2013-11-05 DIAGNOSIS — D509 Iron deficiency anemia, unspecified: Secondary | ICD-10-CM | POA: Diagnosis not present

## 2013-11-05 DIAGNOSIS — N183 Chronic kidney disease, stage 3 (moderate): Secondary | ICD-10-CM | POA: Diagnosis not present

## 2013-11-05 DIAGNOSIS — K219 Gastro-esophageal reflux disease without esophagitis: Secondary | ICD-10-CM | POA: Diagnosis not present

## 2013-11-05 DIAGNOSIS — E039 Hypothyroidism, unspecified: Secondary | ICD-10-CM | POA: Diagnosis not present

## 2013-11-26 DIAGNOSIS — H04123 Dry eye syndrome of bilateral lacrimal glands: Secondary | ICD-10-CM | POA: Diagnosis not present

## 2013-11-26 DIAGNOSIS — H2511 Age-related nuclear cataract, right eye: Secondary | ICD-10-CM | POA: Diagnosis not present

## 2013-11-26 DIAGNOSIS — E119 Type 2 diabetes mellitus without complications: Secondary | ICD-10-CM | POA: Diagnosis not present

## 2013-11-26 DIAGNOSIS — H26492 Other secondary cataract, left eye: Secondary | ICD-10-CM | POA: Diagnosis not present

## 2013-11-26 DIAGNOSIS — Z961 Presence of intraocular lens: Secondary | ICD-10-CM | POA: Diagnosis not present

## 2013-12-04 ENCOUNTER — Other Ambulatory Visit: Payer: Self-pay | Admitting: Family Medicine

## 2013-12-04 DIAGNOSIS — M8580 Other specified disorders of bone density and structure, unspecified site: Secondary | ICD-10-CM

## 2013-12-30 DIAGNOSIS — H2511 Age-related nuclear cataract, right eye: Secondary | ICD-10-CM | POA: Diagnosis not present

## 2014-01-05 DIAGNOSIS — H2511 Age-related nuclear cataract, right eye: Secondary | ICD-10-CM | POA: Diagnosis not present

## 2014-01-12 DIAGNOSIS — Z87891 Personal history of nicotine dependence: Secondary | ICD-10-CM | POA: Diagnosis not present

## 2014-01-12 DIAGNOSIS — N182 Chronic kidney disease, stage 2 (mild): Secondary | ICD-10-CM | POA: Diagnosis not present

## 2014-01-12 DIAGNOSIS — G4730 Sleep apnea, unspecified: Secondary | ICD-10-CM | POA: Diagnosis not present

## 2014-01-12 DIAGNOSIS — I10 Essential (primary) hypertension: Secondary | ICD-10-CM | POA: Diagnosis not present

## 2014-01-26 DIAGNOSIS — E785 Hyperlipidemia, unspecified: Secondary | ICD-10-CM | POA: Diagnosis not present

## 2014-01-28 ENCOUNTER — Encounter: Payer: Self-pay | Admitting: Neurology

## 2014-01-29 ENCOUNTER — Encounter: Payer: Self-pay | Admitting: Neurology

## 2014-01-29 DIAGNOSIS — I2510 Atherosclerotic heart disease of native coronary artery without angina pectoris: Secondary | ICD-10-CM | POA: Diagnosis not present

## 2014-02-02 ENCOUNTER — Encounter: Payer: Self-pay | Admitting: Neurology

## 2014-02-02 ENCOUNTER — Ambulatory Visit (INDEPENDENT_AMBULATORY_CARE_PROVIDER_SITE_OTHER): Payer: Medicare Other | Admitting: Neurology

## 2014-02-02 VITALS — BP 140/78 | HR 71 | Temp 97.0°F | Ht 59.5 in | Wt 211.0 lb

## 2014-02-02 DIAGNOSIS — G4733 Obstructive sleep apnea (adult) (pediatric): Secondary | ICD-10-CM

## 2014-02-02 DIAGNOSIS — R351 Nocturia: Secondary | ICD-10-CM | POA: Diagnosis not present

## 2014-02-02 DIAGNOSIS — G4719 Other hypersomnia: Secondary | ICD-10-CM | POA: Diagnosis not present

## 2014-02-02 DIAGNOSIS — R51 Headache: Secondary | ICD-10-CM

## 2014-02-02 DIAGNOSIS — R519 Headache, unspecified: Secondary | ICD-10-CM

## 2014-02-02 NOTE — Progress Notes (Signed)
Subjective:    Patient ID: Lynn Ray is a 80 y.o. female.  HPI     Star Age, MD, PhD [facility] [address], [address] [place], [place] [address]  Dear Lynn Ray,   I saw your patient, Lynn Ray, upon your kind request in my neurologic clinic today for initial consultation of her sleep disorder, in particular reevaluation of her obstructive sleep apnea. The patient is unaccompanied today. As you know, [name] is a 80 year old right-handed woman with an underlying medical history of severe obesity, long-standing smoking with recent cessation in April 2015, hiatal hernia, hypothyroidism, COPD, aortic sclerosis, rectal cancer, type 2 diabetes with renal impairment, hypertension, Chiari malformation, depression and anemia, who was diagnosed with obstructive sleep apnea several years ago. Her prior sleep study results are not available at this time. She was issued a CPAP machine but did not actually start using it because she was not convinced it would be helpful and that she could tolerate it. She reports snoring, excessive daytime somnolence, nonrestorative sleep and sleep disruption at night, nocturia and occasional morning headaches. She has to get up to use the bathroom once or twice per night every night. She has morning headaches occasionally. She wakes up not well rested. She goes to bed around 11 PM and watches the news in bed and then turns the TV off. Her husband sleeps in the same bed with her. He snores occasionally but does not disturb her sleep. She denies restless leg symptoms but is uncomfortable with pain in her back and on her sides at times. She has trouble finding a comfortable sleep position. She denies parasomnias. She drinks one or 2 cups of coffee per day in the morning typically. She drinks sodas occasionally. She's trying to lose weight. She exercises with a trainer once a week and goes to the gym on her own as well. She has a wake time of 8  AM but feels marginally to poorly rested. Her Epworth sleepiness score is 16 out of 24 today. She tries to nap every day in the afternoon but does not necessarily wake up rested after a nap.  She is willing to get tested again for sleep apnea and be more open to treatment with aAC CPAP machine if the need arises.  Her Past Medical History Is Significant For: Past Medical History  Diagnosis Date  . Arthritis   . Cancer     colon  . Cataract     right eye  . Depression   . Hypertension   . Chiari malformation   . Thyroid disease     hyporthyroid  . Sleep apnea     no CPAP  . Diabetes mellitus     Her Past Surgical History Is Significant For: Past Surgical History  Procedure Laterality Date  . Colonoscopy    . Flexible sigmoidoscopy    . Polypectomy    . Chiari malformation  2013    surgery  . Cataract extraction Bilateral     Her Family History Is Significant For: Family History  Problem Relation Age of Onset  . Colon cancer Neg Hx   . Esophageal cancer Neg Hx   . Stomach cancer Neg Hx   . Depression    . Anemia    . Obesity    . Alzheimer's disease Mother   . Stroke Mother     mini  . Heart attack Mother   . Hypertension Mother     Her Social  History Is Significant For: History   Social History  . Marital Status: Married    Spouse Name: Eddie Dibbles    Number of Children: 2  . Years of Education: Masters   Occupational History  .      retired   Social History Main Topics  . Smoking status: Former Smoker -- 0.25 packs/day  . Smokeless tobacco: Never Used     Comment: 04/2013,quit  . Alcohol Use: No  . Drug Use: No  . Sexual Activity: None   Other Topics Concern  . None   Social History Narrative   Patient consumes one cup of coffee daily    Her Allergies Are:  Allergies  Allergen Reactions  . Aspirin Other (See Comments)    Severe abdominal pain  . Tape Rash    adhesive  :   Her Current Medications Are:  Outpatient Encounter Prescriptions as  of 02/02/2014  Medication Sig  . hydrochlorothiazide (HYDRODIURIL) 25 MG tablet Take 25 mg by mouth daily.  Marland Kitchen levothyroxine (SYNTHROID, LEVOTHROID) 100 MCG tablet Take 100 mcg by mouth daily before breakfast.  . OVER THE COUNTER MEDICATION Take 1 capsule by mouth daily. coffee bean extract capsule   . prednisoLONE acetate (PRED FORTE) 1 % ophthalmic suspension Place 1 drop into both eyes 4 (four) times daily. 1 drop in each eye opthalmic  . TOPROL XL 50 MG 24 hr tablet 1 tablet Daily.  . [DISCONTINUED] hydrochlorothiazide (HYDRODIURIL) 50 MG tablet 1 tablet Daily. 25mg  tablet daily  . [DISCONTINUED] UNABLE TO FIND daily. Perfect biotics,3 billion CFU's  :  Review of Systems:  Out of a complete 14 point review of systems, all are reviewed and negative with the exception of these symptoms as listed below:   Review of Systems  Constitutional:       Weight gain  HENT: Positive for hearing loss.   Musculoskeletal:       Joint pain , aching muscles  Allergic/Immunologic:       Runny nose  Neurological:       Memory loss,snoring  Psychiatric/Behavioral:       Decreased energy,disinterest in activities    Objective:  Neurologic Exam  Physical Exam Physical Examination:   Filed Vitals:   02/02/14 0954  BP: 140/78  Pulse: 71  Temp: 97 F (36.1 C)    General Examination: The patient is a very pleasant 80 y.o. female in no acute distress. She appears well-developed and well-nourished and well groomed. She is obese.  HEENT: Normocephalic, atraumatic, pupils are equal, round and reactive to light and accommodation. Funduscopic exam is normal with sharp disc margins noted. Extraocular tracking is good without limitation to gaze excursion or nystagmus noted.she is status post bilateral cataract repairs. Normal smooth pursuit is noted. Hearing is grossly intact. Tympanic membranes are clear bilaterally. Face is symmetric with normal facial animation and normal facial sensation. Speech is  clear with no dysarthria noted. There is no hypophonia. There is no lip, neck/head, jaw or voice tremor. Neck is supple with full range of passive and active motion. There are no carotid bruits on auscultation. Oropharynx exam reveals: moderate mouth dryness, adequate dental hygiene and she has full dentures in the top and partials on the lower jaw. Mallampati is class III. She has a moderately tight looking airway secondary to redundant soft palate and large tongue. Tonsils are in place but small. Neck size is 15-5/8 inches.  Chest: Clear to auscultation without wheezing, rhonchi or crackles noted.  Heart: S1+S2+0, regular and  normal without murmurs, rubs or gallops noted.   Abdomen: Soft, non-tender and non-distended with normal bowel sounds appreciated on auscultation.  Extremities: There is no pitting edema in the distal lower extremities bilaterally. Pedal pulses are intact.  Skin: Warm and dry without trophic changes noted. There are no varicose veins.  Musculoskeletal: exam reveals no obvious joint deformities, tenderness or joint swelling or erythema.   Neurologically:  Mental status: The patient is awake, alert and oriented in all 4 spheres. Her immediate and remote memory, attention, language skills and fund of knowledge are appropriate. There is no evidence of aphasia, agnosia, apraxia or anomia. Speech is clear with normal prosody and enunciation. Thought process is linear. Mood is normal and affect is normal.  Cranial nerves II - XII are as described above under HEENT exam. In addition: shoulder shrug is normal with equal shoulder height noted. Motor exam: Normal bulk, strength and tone is noted. There is no drift, tremor or rebound. Romberg is negative. Reflexes are 2+ throughout. Babinski: Toes are flexor bilaterally. Fine motor skills and coordination: intact with normal finger taps, normal hand movements, normal rapid alternating patting, normal foot taps and normal foot agility.   Cerebellar testing: No dysmetria or intention tremor on finger to nose testing. Heel to shin is unremarkable bilaterally. There is no truncal or gait ataxia.  Sensory exam: intact to light touch, pinprick, vibration, temperature sense in the upper and lower extremities.  Gait, station and balance: She stands with mild difficulty. No veering to one side is noted. No leaning to one side is noted. Posture is age-appropriate and stance is narrow based. Gait shows normal stride length and normal pace. No problems turning are noted. She turns en bloc. Tandem walk is slightly difficult for her.                Assessment and Plan:   In summary, Himani SYRINA LONSDALE is a very pleasant 80 y.o.-year old female with an underlying medical history of severe obesity, long-standing smoking with recent cessation in April 2015, hiatal hernia, hypothyroidism, COPD, aortic sclerosis, rectal cancer, type 2 diabetes with renal impairment, hypertension, Chiari malformation, depression and anemia, who was diagnosed with obstructive sleep apnea several years ago. She presents for reevaluation of her sleep apnea and potential treatment for it. I had a long chat with the patient about my findings and the diagnosis of OSA, its prognosis and treatment options. We talked about medical treatments, surgical interventions and non-pharmacological approaches. I explained in particular the risks and ramifications of untreated moderate to severe OSA, especially with respect to developing cardiovascular disease down the Road, including congestive heart failure, difficult to treat hypertension, cardiac arrhythmias, or stroke. Even type 2 diabetes has, in part, been linked to untreated OSA. Symptoms of untreated OSA include daytime sleepiness, memory problems, mood irritability and mood disorder such as depression and anxiety, lack of energy, as well as recurrent headaches, especially morning headaches. We talked about trying to maintain a healthy  lifestyle in general, as well as the importance of weight control. I encouraged the patient to eat healthy, exercise daily and keep well hydrated, to keep a scheduled bedtime and wake time routine, to not skip any meals and eat healthy snacks in between meals. I advised the patient not to drive when feeling sleepy. I recommended the following at this time: sleep study with potential positive airway pressure titration. (We will score hypopneas at 4% and split the sleep study into diagnostic and treatment portion, if the  estimated. 2 hour AHI is >15/h).   I explained the sleep test procedure to the patient and also outlined possible surgical and non-surgical treatment options of OSA, including the use of a custom-made dental device (which would require a referral to a specialist dentist or oral surgeon), upper airway surgical options, such as pillar implants, radiofrequency surgery, tongue base surgery, and UPPP (which would involve a referral to an ENT surgeon). Rarely, jaw surgery such as mandibular advancement may be considered.  I also explained the CPAP treatment option to the patient, who indicated that she would be willing to try CPAP if the need arises. I explained the importance of being compliant with PAP treatment, not only for insurance purposes but primarily to improve Her symptoms, and for the patient's long term health benefit, including to reduce Her cardiovascular risks. I answered all her questions today and the patient was in agreement. I would like to see her back after the sleep study is completed and encouraged her to call with any interim questions, concerns, problems or updates.   Thank you very much for allowing me to participate in the care of this nice patient. If I can be of any further assistance to you please do not hesitate to call me at [phone].  Sincerely,   Star Age, MD, PhD

## 2014-02-02 NOTE — Patient Instructions (Signed)

## 2014-02-04 DIAGNOSIS — Z136 Encounter for screening for cardiovascular disorders: Secondary | ICD-10-CM | POA: Diagnosis not present

## 2014-02-04 DIAGNOSIS — E785 Hyperlipidemia, unspecified: Secondary | ICD-10-CM | POA: Diagnosis not present

## 2014-02-04 DIAGNOSIS — E119 Type 2 diabetes mellitus without complications: Secondary | ICD-10-CM | POA: Diagnosis not present

## 2014-02-04 DIAGNOSIS — E662 Morbid (severe) obesity with alveolar hypoventilation: Secondary | ICD-10-CM | POA: Diagnosis not present

## 2014-02-05 ENCOUNTER — Ambulatory Visit: Payer: Medicare Other

## 2014-02-05 ENCOUNTER — Other Ambulatory Visit: Payer: Medicare Other

## 2014-02-06 DIAGNOSIS — M5412 Radiculopathy, cervical region: Secondary | ICD-10-CM | POA: Diagnosis not present

## 2014-02-06 DIAGNOSIS — Z9889 Other specified postprocedural states: Secondary | ICD-10-CM | POA: Diagnosis not present

## 2014-02-09 DIAGNOSIS — N183 Chronic kidney disease, stage 3 (moderate): Secondary | ICD-10-CM | POA: Diagnosis not present

## 2014-02-09 DIAGNOSIS — I129 Hypertensive chronic kidney disease with stage 1 through stage 4 chronic kidney disease, or unspecified chronic kidney disease: Secondary | ICD-10-CM | POA: Diagnosis not present

## 2014-02-10 DIAGNOSIS — I129 Hypertensive chronic kidney disease with stage 1 through stage 4 chronic kidney disease, or unspecified chronic kidney disease: Secondary | ICD-10-CM | POA: Diagnosis not present

## 2014-02-10 DIAGNOSIS — N183 Chronic kidney disease, stage 3 (moderate): Secondary | ICD-10-CM | POA: Diagnosis not present

## 2014-02-11 ENCOUNTER — Ambulatory Visit: Payer: Medicare Other

## 2014-02-11 ENCOUNTER — Other Ambulatory Visit: Payer: Medicare Other

## 2014-02-11 DIAGNOSIS — E559 Vitamin D deficiency, unspecified: Secondary | ICD-10-CM | POA: Diagnosis not present

## 2014-02-11 DIAGNOSIS — I129 Hypertensive chronic kidney disease with stage 1 through stage 4 chronic kidney disease, or unspecified chronic kidney disease: Secondary | ICD-10-CM | POA: Diagnosis not present

## 2014-02-11 DIAGNOSIS — E889 Metabolic disorder, unspecified: Secondary | ICD-10-CM | POA: Diagnosis not present

## 2014-02-11 DIAGNOSIS — M9080 Osteopathy in diseases classified elsewhere, unspecified site: Secondary | ICD-10-CM | POA: Diagnosis not present

## 2014-02-11 DIAGNOSIS — N183 Chronic kidney disease, stage 3 (moderate): Secondary | ICD-10-CM | POA: Diagnosis not present

## 2014-02-16 DIAGNOSIS — M5030 Other cervical disc degeneration, unspecified cervical region: Secondary | ICD-10-CM | POA: Diagnosis not present

## 2014-02-16 DIAGNOSIS — Q0700 Arnold-Chiari syndrome without spina bifida or hydrocephalus: Secondary | ICD-10-CM | POA: Diagnosis not present

## 2014-02-18 ENCOUNTER — Ambulatory Visit
Admission: RE | Admit: 2014-02-18 | Discharge: 2014-02-18 | Disposition: A | Discharge status: Home / Self Care | Payer: Medicare Other | Source: Ambulatory Visit | Attending: Family Medicine | Admitting: Family Medicine

## 2014-02-18 DIAGNOSIS — M8580 Other specified disorders of bone density and structure, unspecified site: Secondary | ICD-10-CM

## 2014-02-18 DIAGNOSIS — Z1231 Encounter for screening mammogram for malignant neoplasm of breast: Secondary | ICD-10-CM | POA: Diagnosis not present

## 2014-03-02 ENCOUNTER — Ambulatory Visit (INDEPENDENT_AMBULATORY_CARE_PROVIDER_SITE_OTHER): Payer: Medicare Other | Admitting: Neurology

## 2014-03-02 DIAGNOSIS — G479 Sleep disorder, unspecified: Secondary | ICD-10-CM

## 2014-03-02 DIAGNOSIS — G4733 Obstructive sleep apnea (adult) (pediatric): Secondary | ICD-10-CM

## 2014-03-02 DIAGNOSIS — G4710 Hypersomnia, unspecified: Secondary | ICD-10-CM

## 2014-03-02 DIAGNOSIS — G4730 Sleep apnea, unspecified: Secondary | ICD-10-CM

## 2014-03-02 NOTE — Sleep Study (Signed)
Please see the scanned sleep study interpretation located in the Procedure tab within the Chart Review section. 

## 2014-03-06 ENCOUNTER — Telehealth: Payer: Self-pay | Admitting: Neurology

## 2014-03-06 DIAGNOSIS — G4733 Obstructive sleep apnea (adult) (pediatric): Secondary | ICD-10-CM

## 2014-03-06 NOTE — Telephone Encounter (Signed)
Please call and notify patient that the recent sleep study confirmed the diagnosis of OSA. She did well with CPAP during the study with significant improvement of the respiratory events. Therefore, I would like start the patient on CPAP at home. I placed the order in the chart.   Arrange for CPAP set up at home through a DME company of patient's choice and fax/route report to PCP and referring MD (if other than PCP).   The patient will also need a follow up appointment with me in 6-8 weeks post set up that has to be scheduled; help the patient schedule this (in a follow-up slot).   Please re-enforce the importance of compliance with treatment and the need for [place] to monitor compliance data.   Once you have spoken to the patient and scheduled the return appointment, you may close this encounter, thanks,   Lynn Age, MD, PhD [facility] ([place])

## 2014-03-09 ENCOUNTER — Encounter: Payer: Self-pay | Admitting: Neurology

## 2014-03-09 DIAGNOSIS — I10 Essential (primary) hypertension: Secondary | ICD-10-CM | POA: Diagnosis not present

## 2014-03-09 DIAGNOSIS — M542 Cervicalgia: Secondary | ICD-10-CM | POA: Diagnosis not present

## 2014-03-09 DIAGNOSIS — N183 Chronic kidney disease, stage 3 (moderate): Secondary | ICD-10-CM | POA: Diagnosis not present

## 2014-03-09 DIAGNOSIS — Z79899 Other long term (current) drug therapy: Secondary | ICD-10-CM | POA: Diagnosis not present

## 2014-03-09 DIAGNOSIS — E785 Hyperlipidemia, unspecified: Secondary | ICD-10-CM | POA: Diagnosis not present

## 2014-03-09 DIAGNOSIS — M5412 Radiculopathy, cervical region: Secondary | ICD-10-CM | POA: Diagnosis not present

## 2014-03-09 DIAGNOSIS — E139 Other specified diabetes mellitus without complications: Secondary | ICD-10-CM | POA: Diagnosis not present

## 2014-03-09 DIAGNOSIS — K219 Gastro-esophageal reflux disease without esophagitis: Secondary | ICD-10-CM | POA: Diagnosis not present

## 2014-03-09 DIAGNOSIS — E039 Hypothyroidism, unspecified: Secondary | ICD-10-CM | POA: Diagnosis not present

## 2014-03-09 DIAGNOSIS — G4730 Sleep apnea, unspecified: Secondary | ICD-10-CM | POA: Diagnosis not present

## 2014-03-10 ENCOUNTER — Encounter: Payer: Self-pay | Admitting: *Deleted

## 2014-03-10 DIAGNOSIS — M542 Cervicalgia: Secondary | ICD-10-CM | POA: Diagnosis not present

## 2014-03-10 DIAGNOSIS — M5412 Radiculopathy, cervical region: Secondary | ICD-10-CM | POA: Diagnosis not present

## 2014-03-10 NOTE — Telephone Encounter (Signed)
Patient was contacted and provided the results of her split night sleep study that revealed a significant amount of sleep disordered breathing.  Patient was informed that treatment in the form of CPAP therapy was recommended by Dr. Rexene Alberts.  The patient was in agreement and was referred to [facility] for CPAP set up.  Dr. Adrian Prows was faxed a copy of the sleep report.  The patient gave verbal permission to mail a copy of her test results.   Patient instructed to contact our office 6-8 weeks post set up to schedule a follow up appointment.

## 2014-03-12 DIAGNOSIS — M542 Cervicalgia: Secondary | ICD-10-CM | POA: Diagnosis not present

## 2014-03-12 DIAGNOSIS — M5412 Radiculopathy, cervical region: Secondary | ICD-10-CM | POA: Diagnosis not present

## 2014-03-17 ENCOUNTER — Ambulatory Visit: Payer: Medicare Other

## 2014-03-25 DIAGNOSIS — M5412 Radiculopathy, cervical region: Secondary | ICD-10-CM | POA: Diagnosis not present

## 2014-03-25 DIAGNOSIS — M542 Cervicalgia: Secondary | ICD-10-CM | POA: Diagnosis not present

## 2014-03-27 DIAGNOSIS — M542 Cervicalgia: Secondary | ICD-10-CM | POA: Diagnosis not present

## 2014-03-27 DIAGNOSIS — M5412 Radiculopathy, cervical region: Secondary | ICD-10-CM | POA: Diagnosis not present

## 2014-04-01 DIAGNOSIS — M5412 Radiculopathy, cervical region: Secondary | ICD-10-CM | POA: Diagnosis not present

## 2014-04-01 DIAGNOSIS — M542 Cervicalgia: Secondary | ICD-10-CM | POA: Diagnosis not present

## 2014-04-08 DIAGNOSIS — M5412 Radiculopathy, cervical region: Secondary | ICD-10-CM | POA: Diagnosis not present

## 2014-04-08 DIAGNOSIS — M542 Cervicalgia: Secondary | ICD-10-CM | POA: Diagnosis not present

## 2014-04-10 DIAGNOSIS — M542 Cervicalgia: Secondary | ICD-10-CM | POA: Diagnosis not present

## 2014-04-10 DIAGNOSIS — M5412 Radiculopathy, cervical region: Secondary | ICD-10-CM | POA: Diagnosis not present

## 2014-04-13 DIAGNOSIS — M542 Cervicalgia: Secondary | ICD-10-CM | POA: Diagnosis not present

## 2014-04-13 DIAGNOSIS — M5412 Radiculopathy, cervical region: Secondary | ICD-10-CM | POA: Diagnosis not present

## 2014-04-15 ENCOUNTER — Telehealth: Payer: Self-pay | Admitting: Neurology

## 2014-04-15 NOTE — Telephone Encounter (Signed)
Patient has been scheduled/confirmed for appointment with Dr Rexene Alberts for CPAP f/u.

## 2014-04-16 ENCOUNTER — Encounter: Payer: Medicare Other | Attending: Family Medicine | Admitting: *Deleted

## 2014-04-16 ENCOUNTER — Encounter: Payer: Self-pay | Admitting: *Deleted

## 2014-04-16 VITALS — Ht 59.5 in | Wt 213.1 lb

## 2014-04-16 DIAGNOSIS — E119 Type 2 diabetes mellitus without complications: Secondary | ICD-10-CM | POA: Diagnosis not present

## 2014-04-16 DIAGNOSIS — Z713 Dietary counseling and surveillance: Secondary | ICD-10-CM | POA: Diagnosis not present

## 2014-04-16 NOTE — Patient Instructions (Signed)
Plan:  Aim for 2 Carb Choices per meal (30 grams) +/- 1 either way  Aim for 0-1 Carbs per snack if hungry  Include protein in moderation with your meals and snacks Consider reading food labels for Total Carbohydrate of foods Continue  increasing your activity level by going to the gym as tolerated Consider checking BG at alternate times per day

## 2014-04-17 DIAGNOSIS — M5412 Radiculopathy, cervical region: Secondary | ICD-10-CM | POA: Diagnosis not present

## 2014-04-17 DIAGNOSIS — M542 Cervicalgia: Secondary | ICD-10-CM | POA: Diagnosis not present

## 2014-04-24 DIAGNOSIS — M5412 Radiculopathy, cervical region: Secondary | ICD-10-CM | POA: Diagnosis not present

## 2014-04-24 DIAGNOSIS — M542 Cervicalgia: Secondary | ICD-10-CM | POA: Diagnosis not present

## 2014-04-24 NOTE — Progress Notes (Signed)
Diabetes Self-Management Education  Visit Type:  initial  Appt. Start Time: 1400 Appt. End Time: [id number]  04/24/2014  [name], identified by name and date of birth, is a 80 y.o. female with a diagnosis of Diabetes: Type 2.  Other people present during visit:  Patient   ASSESSMENT  Height 4' 11.5" (1.511 m), weight 213 lb 1.6 oz (96.662 kg). Body mass index is 42.34 kg/(m^2).  Initial Visit Information:      Are you taking your medications as prescribed?: Yes Are you checking your feet?: No        Psychosocial:     Patient Belief/Attitude about Diabetes: Motivated to manage diabetes Other persons present: Patient Patient Concerns: Nutrition/Meal planning Preferred Learning Style: Hands on Learning Readiness: Change in progress  Complications:   Last HgB A1C per patient/outside source: 6.5% How often do you check your blood sugar?:  (twice a week) Fasting Blood glucose range (mg/dL): 70-129 Number of hypoglycemic episodes per month: 0 Have you had a dilated eye exam in the past 12 months?: Yes Have you had a dental exam in the past 12 months?: Yes  Diet Intake:  Breakfast: regular oatmeal, toast with butter and jelly and coffee with Stevia Snack (morning): no Lunch: skips usually due to late breakfast  Snack (afternoon): no Dinner: lean meat, limited starches, vegetables, occasionally a salad, tea with Stevia Snack (evening): occasionally popcorn OR a cookie OR 1 scoop ice cream Beverage(s): coffee, tea, some water  Exercise:  Exercise: ADL's, Light (walking / raking leaves) (works out with trainer once a week and another day she is doing cardio exercises) Light Exercise amount of time (min / week): 60  Individualized Plan for Diabetes Self-Management Training:   Learning Objective:  Patient will have a greater understanding of diabetes self-management.  Patient education plan per assessed needs and concerns is to attend individual sessions for      Education Topics Reviewed with Patient Today:  Definition of diabetes, type 1 and 2, and the diagnosis of diabetes Role of diet in the treatment of diabetes and the relationship between the three main macronutrients and blood glucose level, Food label reading, portion sizes and measuring food., Carbohydrate counting Role of exercise on diabetes management, blood pressure control and cardiac health. Reviewed patients medication for diabetes, action, purpose, timing of dose and side effects. Purpose and frequency of SMBG., Identified appropriate SMBG and/or A1C goals.   Relationship between chronic complications and blood glucose control Role of stress on diabetes      PATIENTS GOALS/Plan (Developed by the patient):  Nutrition: Follow meal plan discussed Physical Activity: Exercise 1-2 times per week Medications: take my medication as prescribed Monitoring : test my blood glucose as discussed (note x per day with comment)  Plan:   Patient Instructions  Plan:  Aim for 2 Carb Choices per meal (30 grams) +/- 1 either way  Aim for 0-1 Carbs per snack if hungry  Include protein in moderation with your meals and snacks Consider reading food labels for Total Carbohydrate of foods Continue  increasing your activity level by going to the gym as tolerated Consider checking BG at alternate times per day         Expected Outcomes:  Demonstrated interest in learning. Expect positive outcomes  Education material provided: Living Well with Diabetes, A1C conversion sheet, Meal plan card and Carbohydrate counting sheet  If problems or questions, patient to contact team via:  Phone and Email  Future DSME appointment: PRN

## 2014-04-26 DIAGNOSIS — M5412 Radiculopathy, cervical region: Secondary | ICD-10-CM | POA: Diagnosis not present

## 2014-04-26 DIAGNOSIS — M542 Cervicalgia: Secondary | ICD-10-CM | POA: Diagnosis not present

## 2014-04-28 DIAGNOSIS — M5412 Radiculopathy, cervical region: Secondary | ICD-10-CM | POA: Diagnosis not present

## 2014-04-28 DIAGNOSIS — M542 Cervicalgia: Secondary | ICD-10-CM | POA: Diagnosis not present

## 2014-05-07 ENCOUNTER — Ambulatory Visit (INDEPENDENT_AMBULATORY_CARE_PROVIDER_SITE_OTHER): Payer: Medicare Other | Admitting: Neurology

## 2014-05-07 ENCOUNTER — Encounter: Payer: Self-pay | Admitting: Neurology

## 2014-05-07 VITALS — BP 143/90 | HR 74 | Resp 24 | Ht 59.0 in | Wt 210.0 lb

## 2014-05-07 DIAGNOSIS — R351 Nocturia: Secondary | ICD-10-CM

## 2014-05-07 DIAGNOSIS — E669 Obesity, unspecified: Secondary | ICD-10-CM

## 2014-05-07 DIAGNOSIS — Z9989 Dependence on other enabling machines and devices: Principal | ICD-10-CM

## 2014-05-07 DIAGNOSIS — M542 Cervicalgia: Secondary | ICD-10-CM | POA: Diagnosis not present

## 2014-05-07 DIAGNOSIS — M5412 Radiculopathy, cervical region: Secondary | ICD-10-CM | POA: Diagnosis not present

## 2014-05-07 DIAGNOSIS — G4733 Obstructive sleep apnea (adult) (pediatric): Secondary | ICD-10-CM

## 2014-05-07 NOTE — Patient Instructions (Signed)
We will get in touch with [employer] for excess leak from the mask.   Please continue using your CPAP regularly. While your insurance requires that you use CPAP at least 4 hours each night on 70% of the nights, I recommend, that you not skip any nights and use it throughout the night if you can. Getting used to CPAP and staying with the treatment long term does take time and patience and discipline. Untreated obstructive sleep apnea when it is moderate to severe can have an adverse impact on cardiovascular health and raise her risk for heart disease, arrhythmias, hypertension, congestive heart failure, stroke and diabetes. Untreated obstructive sleep apnea causes sleep disruption, nonrestorative sleep, and sleep deprivation. This can have an impact on your day to day functioning and cause daytime sleepiness and impairment of cognitive function, memory loss, mood disturbance, and problems focussing. Using CPAP regularly can improve these symptoms.

## 2014-05-07 NOTE — Progress Notes (Signed)
Subjective:    Ray ID: Lynn Ray is a 80 y.o. female.  HPI     Interim history:   Lynn Ray is a 80 year old right-handed woman with an underlying medical history of severe obesity, long-standing of smoking (quit in April 2015), hiatal hernia, hypothyroidism, COPD, aortic sclerosis, rectal cancer, type 2 diabetes with renal impairment, hypertension, Chiari malformation, depression and anemia, who presents for follow-up consultation of Lynn Ray obstructive sleep apnea, with CPAP therapy. Lynn Ray is unaccompanied by today. I first met Lynn Ray on 02/02/2014 at Lynn request of Lynn Ray cardiologist, at which time Lynn Ray reported a prior diagnosis of obstructive sleep apnea several years prior. We did not have prior test results available for review. Lynn Ray actually had never used CPAP for. I invited Lynn Ray back for a sleep study for reevaluation. Lynn Ray had a split-night sleep study on 03/02/2014 and underwent over Lynn Ray test results with Lynn Ray in detail today. Lynn Ray baseline sleep efficiency was severely reduced at 11.7% only with a latency to sleep of 121 minutes and wake after sleep onset of 133 minutes with moderate to severe sleep fragmentation. Lynn Ray had absence of slow-wave and absence of REM sleep prior to CPAP. Lynn Ray had no significant EKG or EEG changes. Lynn Ray had mild to moderate snoring. Lynn Ray had a total AHI of 107.5 per hour. Average oxygen saturation was 92%, nadir was 86%. Lynn Ray was titrated on CPAP during Lynn later part of Lynn study. Sleep efficiency was 51.8% and Lynn Ray achieved slow-wave and a significant amount of REM sleep. Average oxygen saturation was 94%, nadir was 89%. Lynn Ray had no significant PLMS before after CPAP. CPAP was titrated from 4-8 cm. AHI was 0 per hour on Lynn final pressure with supine REM sleep achieved. Based on Lynn test results I prescribed CPAP therapy for home use.    Today, 05/07/2014: I reviewed Lynn Ray CPAP compliance data from 04/06/2014 through 05/05/2014 which is a total of 30 days during which time  Lynn Ray used Lynn Ray machine every night with percent used days greater than 4 hours at 97%, indicating excellent compliance with an average usage of 4 hours and 32 minutes, residual AHI low at 0.9 per hour and leak at times high with Lynn 95th percentile at 47.2 L/m on a pressure of 8 cm with EPR of 1.  Today, 05/07/2014: Lynn Ray reports that Lynn Ray still trying to adjust treatment. Lynn Ray has noted that air leaking. On Lynn positive side, Lynn Ray has noted that Lynn Ray sleep quality and consolidation is a little better. Lynn Ray daytime sleepiness is a little better and Lynn Ray nocturia is better as well. Lynn Ray's trying to stay compliant and when Lynn Ray took a trip Lynn Ray even took Lynn Ray machine with Lynn Ray. Lynn Ray says Lynn Ray was never actually diagnosed with COPD. Lynn Ray is proud to declare that Lynn Ray has been able to stay away from smoking for a year now.  Previously:   Lynn Ray was diagnosed with obstructive sleep apnea several years ago. Lynn Ray prior sleep study results are not available at this time. Lynn Ray was issued a CPAP machine but did not actually start using it because Lynn Ray was not convinced it would be helpful and that Lynn Ray could tolerate it. Lynn Ray reports snoring, excessive daytime somnolence, nonrestorative sleep and sleep disruption at night, nocturia and occasional morning headaches. Lynn Ray has to get up to use Lynn bathroom once or twice per night every night. Lynn Ray has morning headaches occasionally. Lynn Ray wakes up not well rested. Lynn Ray goes to bed around 11 PM and watches Lynn news in bed and  then turns Lynn TV off. Lynn Ray husband sleeps in Lynn same bed with Lynn Ray. He snores occasionally but does not disturb Lynn Ray sleep. Lynn Ray denies restless leg symptoms but is uncomfortable with pain in Lynn Ray back and on Lynn Ray sides at times. Lynn Ray has trouble finding a comfortable sleep position. Lynn Ray denies parasomnias. Lynn Ray drinks one or 2 cups of coffee per day in Lynn morning typically. Lynn Ray drinks sodas occasionally. Lynn Ray's trying to lose weight. Lynn Ray exercises with a trainer once a week and goes to  Lynn gym on Lynn Ray own as well. Lynn Ray has a wake time of 8 AM but feels marginally to poorly rested. Lynn Ray Epworth sleepiness score is 16 out of 24 today. Lynn Ray tries to nap every day in Lynn afternoon but does not necessarily wake up rested after a nap.   Lynn Ray is willing to get tested again for sleep apnea and be more open to treatment with aAC CPAP machine if Lynn need arises.  Lynn Ray Past Medical History Is Significant For: Past Medical History  Diagnosis Date  . Arthritis   . Cancer     colon  . Cataract     right eye  . Depression   . Hypertension   . Chiari malformation   . Thyroid disease     hyporthyroid  . Sleep apnea     no CPAP  . Diabetes mellitus     Lynn Ray Past Surgical History Is Significant For: Past Surgical History  Procedure Laterality Date  . Colonoscopy    . Flexible sigmoidoscopy    . Polypectomy    . Chiari malformation  2013    surgery  . Cataract extraction Bilateral     Lynn Ray Family History Is Significant For: Family History  Problem Relation Age of Onset  . Colon cancer Neg Hx   . Esophageal cancer Neg Hx   . Stomach cancer Neg Hx   . Depression    . Anemia    . Obesity    . Alzheimer's disease Mother   . Stroke Mother     mini  . Heart attack Mother   . Hypertension Mother     Lynn Ray Social History Is Significant For: History   Social History  . Marital Status: Married    Spouse Name: Eddie Dibbles  . Number of Children: 2  . Years of Education: Masters   Occupational History  .      retired   Social History Main Topics  . Smoking status: Former Smoker -- 0.25 packs/day  . Smokeless tobacco: Never Used     Comment: 04/2013,quit  . Alcohol Use: No  . Drug Use: No  . Sexual Activity: Not on file   Other Topics Concern  . None   Social History Narrative   Ray consumes one cup of coffee daily    Lynn Ray Allergies Are:  Allergies  Allergen Reactions  . Aspirin Other (See Comments)    Severe abdominal pain  . Tape Rash    adhesive  :   Lynn Ray  Current Medications Are:  Outpatient Encounter Prescriptions as of 05/07/2014  Medication Sig  . hydrochlorothiazide (HYDRODIURIL) 25 MG tablet Take 25 mg by mouth daily.  Marland Kitchen levothyroxine (SYNTHROID, LEVOTHROID) 100 MCG tablet Take 100 mcg by mouth daily before breakfast.  . OVER Lynn COUNTER MEDICATION Take 1 capsule by mouth daily. coffee bean extract capsule   . TOPROL XL 50 MG 24 hr tablet 1 tablet Daily.  . [DISCONTINUED] prednisoLONE acetate (PRED FORTE) 1 % ophthalmic suspension Place 1  drop into both eyes 4 (four) times daily. 1 drop in each eye opthalmic  :  Review of Systems:  Out of a complete 14 point review of systems, all are reviewed and negative with Lynn exception of these symptoms as listed below:   Review of Systems  All other systems reviewed and are negative.   Objective:  Neurologic Exam  Physical Exam Physical Examination:   Filed Vitals:   05/07/14 1249  BP: 143/90  Pulse: 74  Resp: 24   General Examination: Lynn Ray is a very pleasant 80 y.o. female in no acute distress. Lynn Ray appears well-developed and well-nourished and well groomed. Lynn Ray is obese.  HEENT: Normocephalic, atraumatic, pupils are equal, round and reactive to light and accommodation. Funduscopic exam is normal with sharp disc margins noted. Extraocular tracking is good without limitation to gaze excursion or nystagmus noted.Lynn Ray is status post bilateral cataract repairs. Normal smooth pursuit is noted. Hearing is grossly intact. Face is symmetric with normal facial animation and normal facial sensation. Speech is clear with no dysarthria noted. There is no hypophonia. There is no lip, neck/head, jaw or voice tremor. Neck is supple with full range of passive and active motion. There are no carotid bruits on auscultation. Oropharynx exam reveals: moderate mouth dryness, adequate dental hygiene and Lynn Ray has full dentures in Lynn top and partials on Lynn lower jaw. Mallampati is class III. Lynn Ray has a  moderately tight looking airway secondary to redundant soft palate and large tongue. Tonsils are in place but small.   Chest: Clear to auscultation without wheezing, rhonchi or crackles noted.  Heart: S1+S2+0, regular and normal without murmurs, rubs or gallops noted.   Abdomen: Soft, non-tender and non-distended with normal bowel sounds appreciated on auscultation.  Extremities: There is no pitting edema in Lynn distal lower extremities bilaterally. Pedal pulses are intact.  Skin: Warm and dry without trophic changes noted. There are no varicose veins.  Musculoskeletal: exam reveals no obvious joint deformities, tenderness or joint swelling or erythema.   Neurologically:  Mental status: Lynn Ray is awake, alert and oriented in all 4 spheres. Lynn Ray immediate and remote memory, attention, language skills and fund of knowledge are appropriate. There is no evidence of aphasia, agnosia, apraxia or anomia. Speech is clear with normal prosody and enunciation. Thought process is linear. Mood is normal and affect is normal.  Cranial nerves II - XII are as described above under HEENT exam. In addition: shoulder shrug is normal with equal shoulder height noted. Motor exam: Normal bulk, strength and tone is noted. There is no drift, tremor or rebound. Romberg is negative. Reflexes are 2+ throughout. Babinski: Toes are flexor bilaterally. Fine motor skills and coordination: intact with normal finger taps, normal hand movements, normal rapid alternating patting, normal foot taps and normal foot agility.  Cerebellar testing: No dysmetria or intention tremor on finger to nose testing. Heel to shin is unremarkable bilaterally. There is no truncal or gait ataxia.  Sensory exam: intact to light touch, pinprick, vibration, temperature sense in Lynn upper and lower extremities.  Gait, station and balance: Lynn Ray stands with mild difficulty. No veering to one side is noted. No leaning to one side is noted. Posture is  age-appropriate and stance is narrow based. Gait shows normal stride length and normal pace. No problems turning are noted. Lynn Ray turns en bloc. Tandem walk is slightly difficult for Lynn Ray.                Assessment and Plan:  In summary, Lynn Ray is a very pleasant 80 year old female with an underlying medical history of severe obesity, prior smoking with recent cessation in April 2015, hiatal hernia, hypothyroidism, COPD, aortic sclerosis, rectal cancer, type 2 diabetes with renal impairment, hypertension, Chiari malformation, depression and anemia, who was diagnosed with obstructive sleep apnea several years ago. Lynn Ray presents for follow-up consultation of Lynn Ray obstructive sleep apnea after Lynn Ray recent split-night sleep study. We discussed Lynn Ray study results in detail and also reviewed Lynn Ray compliance data today. I congratulated Lynn Ray on Lynn Ray excellent compliance. I also encouraged Lynn Ray to continue to be adherent to treatment. Lynn Ray has noted improvement in some of Lynn Ray sleep-related issues and is motivated to continue to try. Lynn Ray had some questions about Lynn Ray mask which I answered. Lynn Ray does have some issues with air leaking from Lynn mask. Lynn Ray may need a smaller mass than what Lynn Ray has. We will get in touch with Lynn Ray DME company regarding a mask refit.   We again talked about Lynn diagnosis of obstructive sleep apnea its prognosis and treatment options. I explained in particular Lynn risks and ramifications of untreated moderate to severe OSA, especially with respect to developing cardiovascular disease down Lynn Road, including congestive heart failure, difficult to treat hypertension, cardiac arrhythmias, or stroke. Even type 2 diabetes has, in part, been linked to untreated OSA. Symptoms of untreated OSA include daytime sleepiness, memory problems, mood irritability and mood disorder such as depression and anxiety, lack of energy, as well as recurrent headaches, especially morning headaches. We talked about trying to  maintain a healthy lifestyle in general, as well as Lynn importance of weight control. I encouraged Lynn Ray to eat healthy, exercise daily and keep well hydrated, to keep a scheduled bedtime and wake time routine, to not skip any meals and eat healthy snacks in between meals. I advised Lynn Ray not to drive when feeling sleepy. I again explained Lynn importance of being compliant with PAP treatment, not only for insurance purposes but primarily to improve Lynn Ray symptoms, and for Lynn Ray's long term health benefit, including to reduce Lynn Ray cardiovascular risks. I would like to see Lynn Ray back in 6 months, sooner if Lynn need arises. Lynn Ray is encouraged to call with any interim questions. I answered all Lynn Ray questions today and Lynn Ray was in agreement. I spent 25 minutes in total face-to-face time with Lynn Ray, more than 50% of which was spent in counseling and coordination of care, reviewing test results, reviewing medication and discussing or reviewing Lynn diagnosis of OSA, its prognosis and treatment options.

## 2014-05-08 ENCOUNTER — Telehealth: Payer: Self-pay

## 2014-05-08 NOTE — Telephone Encounter (Signed)
Lynn Ray was seen here yesterday and with going over CPAP results, it seemed that patient was having a lot of leaking from her mask. I spoke with [facility] this morning about possible being refitted. The respiratory therapist stated that they will call the patient to address.

## 2014-05-14 DIAGNOSIS — M542 Cervicalgia: Secondary | ICD-10-CM | POA: Diagnosis not present

## 2014-05-14 DIAGNOSIS — M5412 Radiculopathy, cervical region: Secondary | ICD-10-CM | POA: Diagnosis not present

## 2014-05-21 DIAGNOSIS — M542 Cervicalgia: Secondary | ICD-10-CM | POA: Diagnosis not present

## 2014-05-21 DIAGNOSIS — M5412 Radiculopathy, cervical region: Secondary | ICD-10-CM | POA: Diagnosis not present

## 2014-06-03 DIAGNOSIS — M5412 Radiculopathy, cervical region: Secondary | ICD-10-CM | POA: Diagnosis not present

## 2014-06-03 DIAGNOSIS — M542 Cervicalgia: Secondary | ICD-10-CM | POA: Diagnosis not present

## 2014-06-17 DIAGNOSIS — N183 Chronic kidney disease, stage 3 (moderate): Secondary | ICD-10-CM | POA: Diagnosis not present

## 2014-06-18 DIAGNOSIS — M5412 Radiculopathy, cervical region: Secondary | ICD-10-CM | POA: Diagnosis not present

## 2014-06-18 DIAGNOSIS — M542 Cervicalgia: Secondary | ICD-10-CM | POA: Diagnosis not present

## 2014-07-01 DIAGNOSIS — E559 Vitamin D deficiency, unspecified: Secondary | ICD-10-CM | POA: Diagnosis not present

## 2014-07-01 DIAGNOSIS — I129 Hypertensive chronic kidney disease with stage 1 through stage 4 chronic kidney disease, or unspecified chronic kidney disease: Secondary | ICD-10-CM | POA: Diagnosis not present

## 2014-07-01 DIAGNOSIS — E889 Metabolic disorder, unspecified: Secondary | ICD-10-CM | POA: Diagnosis not present

## 2014-07-01 DIAGNOSIS — M9080 Osteopathy in diseases classified elsewhere, unspecified site: Secondary | ICD-10-CM | POA: Diagnosis not present

## 2014-07-01 DIAGNOSIS — N183 Chronic kidney disease, stage 3 (moderate): Secondary | ICD-10-CM | POA: Diagnosis not present

## 2014-09-03 DIAGNOSIS — B354 Tinea corporis: Secondary | ICD-10-CM | POA: Diagnosis not present

## 2014-09-10 DIAGNOSIS — L309 Dermatitis, unspecified: Secondary | ICD-10-CM | POA: Diagnosis not present

## 2014-09-12 ENCOUNTER — Encounter (HOSPITAL_COMMUNITY): Payer: Self-pay

## 2014-09-12 ENCOUNTER — Emergency Department (HOSPITAL_COMMUNITY)
Admission: EM | Admit: 2014-09-12 | Discharge: 2014-09-12 | Disposition: A | Discharge status: Home / Self Care | Payer: Medicare Other | Attending: Emergency Medicine | Admitting: Emergency Medicine

## 2014-09-12 DIAGNOSIS — M1990 Unspecified osteoarthritis, unspecified site: Secondary | ICD-10-CM | POA: Insufficient documentation

## 2014-09-12 DIAGNOSIS — Z85038 Personal history of other malignant neoplasm of large intestine: Secondary | ICD-10-CM | POA: Diagnosis not present

## 2014-09-12 DIAGNOSIS — Z87891 Personal history of nicotine dependence: Secondary | ICD-10-CM | POA: Diagnosis not present

## 2014-09-12 DIAGNOSIS — Z79899 Other long term (current) drug therapy: Secondary | ICD-10-CM | POA: Diagnosis not present

## 2014-09-12 DIAGNOSIS — Z8659 Personal history of other mental and behavioral disorders: Secondary | ICD-10-CM | POA: Diagnosis not present

## 2014-09-12 DIAGNOSIS — E119 Type 2 diabetes mellitus without complications: Secondary | ICD-10-CM | POA: Insufficient documentation

## 2014-09-12 DIAGNOSIS — Z8669 Personal history of other diseases of the nervous system and sense organs: Secondary | ICD-10-CM | POA: Diagnosis not present

## 2014-09-12 DIAGNOSIS — I10 Essential (primary) hypertension: Secondary | ICD-10-CM | POA: Diagnosis not present

## 2014-09-12 DIAGNOSIS — E039 Hypothyroidism, unspecified: Secondary | ICD-10-CM | POA: Insufficient documentation

## 2014-09-12 DIAGNOSIS — L309 Dermatitis, unspecified: Secondary | ICD-10-CM | POA: Insufficient documentation

## 2014-09-12 DIAGNOSIS — R21 Rash and other nonspecific skin eruption: Secondary | ICD-10-CM | POA: Diagnosis not present

## 2014-09-12 MED ORDER — DIPHENHYDRAMINE HCL 25 MG PO TABS: 25.0000 mg | ORAL_TABLET | Freq: Four times a day (QID) | ORAL | Status: DC

## 2014-09-12 MED ORDER — PREDNISONE 20 MG PO TABS: ORAL_TABLET | ORAL | Status: DC

## 2014-09-12 NOTE — Discharge Instructions (Signed)
Rash  If the rash does not respond to treatment and the cause is not found. Sometimes a biopsy or other skin tests are needed to try to diagnose the type of rash. Schedule a recheck with your dermatologist.   A rash is a change in the color or texture of your skin. There are many different types of rashes. You may have other problems that accompany your rash. CAUSES   Infections.  Allergic reactions. This can include allergies to pets or foods.  Certain medicines.  Exposure to certain chemicals, soaps, or cosmetics.  Heat.  Exposure to poisonous plants.  Tumors, both cancerous and noncancerous. SYMPTOMS   Redness.  Scaly skin.  Itchy skin.  Dry or cracked skin.  Bumps.  Blisters.  Pain. DIAGNOSIS  Your caregiver may do a physical exam to determine what type of rash you have. A skin sample (biopsy) may be taken and examined under a microscope. TREATMENT  Treatment depends on the type of rash you have. Your caregiver may prescribe certain medicines. For serious conditions, you may need to see a skin doctor (dermatologist). HOME CARE INSTRUCTIONS   Avoid the substance that caused your rash.  Do not scratch your rash. This can cause infection.  You may take cool baths to help stop itching.  Only take over-the-counter or prescription medicines as directed by your caregiver.  Keep all follow-up appointments as directed by your caregiver. SEEK IMMEDIATE MEDICAL CARE IF:  You have increasing pain, swelling, or redness.  You have a fever.  You have new or severe symptoms.  You have body aches, diarrhea, or vomiting.  Your rash is not better after 3 days. MAKE SURE YOU:  Understand these instructions.  Will watch your condition.  Will get help right away if you are not doing well or get worse. Document Released: 01/06/2002 Document Revised: 04/10/2011 Document Reviewed: 10/31/2010 [facility] Patient Information 2015 [place], [place]. This information is not  intended to replace advice given to you by your health care provider. Make sure you discuss any questions you have with your health care provider.  Possible Contact Dermatitis Contact dermatitis is a reaction to certain substances that touch the skin. Contact dermatitis can be either irritant contact dermatitis or allergic contact dermatitis. Irritant contact dermatitis does not require previous exposure to the substance for a reaction to occur.Allergic contact dermatitis only occurs if you have been exposed to the substance before. Upon a repeat exposure, your body reacts to the substance.  CAUSES  Many substances can cause contact dermatitis. Irritant dermatitis is most commonly caused by repeated exposure to mildly irritating substances, such as:  Makeup.  Soaps.  Detergents.  Bleaches.  Acids.  Metal salts, such as nickel. Allergic contact dermatitis is most commonly caused by exposure to:  Poisonous plants.  Chemicals (deodorants, shampoos).  Jewelry.  Latex.  Neomycin in triple antibiotic cream.  Preservatives in products, including clothing. SYMPTOMS  The area of skin that is exposed may develop:  Dryness or flaking.  Redness.  Cracks.  Itching.  Pain or a burning sensation.  Blisters. With allergic contact dermatitis, there may also be swelling in areas such as the eyelids, mouth, or genitals.  DIAGNOSIS  Your caregiver can usually tell what the problem is by doing a physical exam. In cases where the cause is uncertain and an allergic contact dermatitis is suspected, a patch skin test may be performed to help determine the cause of your dermatitis. TREATMENT Treatment includes protecting the skin from further contact with the  irritating substance by avoiding that substance if possible. Barrier creams, powders, and gloves may be helpful. Your caregiver may also recommend:  Steroid creams or ointments applied 2 times daily. For best results, soak the rash  area in cool water for 20 minutes. Then apply the medicine. Cover the area with a plastic wrap. You can store the steroid cream in the refrigerator for a "chilly" effect on your rash. That may decrease itching. Oral steroid medicines may be needed in more severe cases.  Antibiotics or antibacterial ointments if a skin infection is present.  Antihistamine lotion or an antihistamine taken by mouth to ease itching.  Lubricants to keep moisture in your skin.  Burow's solution to reduce redness and soreness or to dry a weeping rash. Mix one packet or tablet of solution in 2 cups cool water. Dip a clean washcloth in the mixture, wring it out a bit, and put it on the affected area. Leave the cloth in place for 30 minutes. Do this as often as possible throughout the day.  Taking several cornstarch or baking soda baths daily if the area is too large to cover with a washcloth. Harsh chemicals, such as alkalis or acids, can cause skin damage that is like a burn. You should flush your skin for 15 to 20 minutes with cold water after such an exposure. You should also seek immediate medical care after exposure. Bandages (dressings), antibiotics, and pain medicine may be needed for severely irritated skin.  HOME CARE INSTRUCTIONS  Avoid the substance that caused your reaction.  Keep the area of skin that is affected away from hot water, soap, sunlight, chemicals, acidic substances, or anything else that would irritate your skin.  Do not scratch the rash. Scratching may cause the rash to become infected.  You may take cool baths to help stop the itching.  Only take over-the-counter or prescription medicines as directed by your caregiver.  See your caregiver for follow-up care as directed to make sure your skin is healing properly. SEEK MEDICAL CARE IF:   Your condition is not better after 3 days of treatment.  You seem to be getting worse.  You see signs of infection such as swelling, tenderness,  redness, soreness, or warmth in the affected area.  You have any problems related to your medicines. Document Released: 01/14/2000 Document Revised: 04/10/2011 Document Reviewed: 06/21/2010 [facility] Patient Information 2015 [place], [place]. This information is not intended to replace advice given to you by your health care provider. Make sure you discuss any questions you have with your health care provider.

## 2014-09-12 NOTE — ED Notes (Addendum)
Pt reports rash and itching on right cheek x 1 month.  Seen dermatologist 1 week ago, was told possible ringworm, prescribed Ketoconazole 2%.  Onset yesterday bumps and swelling over face.

## 2014-09-12 NOTE — ED Provider Notes (Signed)
CSN: [id number]     Arrival date & time 09/12/14  1304 History   First MD Initiated Contact with Patient 09/12/14 1515     Chief Complaint  Patient presents with  . Rash  . Facial Swelling     (Consider location/radiation/quality/duration/timing/severity/associated sxs/prior Treatment) HPI The patient developed a patch of rash on her cheek somewhere between 1 and 2 months ago. She reports that it was itchy and somewhat tender. She tried over-the-counter cortisone for a while and it did help somewhat but never really went away. At that point she made an appointment to see the dermatologist and she was started on ketaconazole cream a week ago. Since she began using it, the rash spread and she developed bumps over much of her face as well as swelling around her eyes. She did follow up at her [facility] office, at that time they suggested she continue to use ketaconazole as prescribed. The rash has continued to worsen over her face and neck. No other associated symptoms. Past Medical History  Diagnosis Date  . Arthritis   . Cancer     colon  . Cataract     right eye  . Depression   . Hypertension   . Chiari malformation   . Thyroid disease     hyporthyroid  . Sleep apnea     no CPAP  . Diabetes mellitus    Past Surgical History  Procedure Laterality Date  . Colonoscopy    . Flexible sigmoidoscopy    . Polypectomy    . Chiari malformation  2013    surgery  . Cataract extraction Bilateral    Family History  Problem Relation Age of Onset  . Colon cancer Neg Hx   . Esophageal cancer Neg Hx   . Stomach cancer Neg Hx   . Depression    . Anemia    . Obesity    . Alzheimer's disease Mother   . Stroke Mother     mini  . Heart attack Mother   . Hypertension Mother    Social History  Substance Use Topics  . Smoking status: Former Smoker -- 0.25 packs/day  . Smokeless tobacco: Never Used     Comment: 04/2013,quit  . Alcohol Use: No   OB History    No data available      Review of Systems 10 Systems reviewed and are negative for acute change except as noted in the HPI.    Allergies  Aspirin and Tape  Home Medications   Prior to Admission medications   Medication Sig Start Date End Date Taking? Authorizing Provider  diphenhydrAMINE (BENADRYL) 25 MG tablet Take 1 tablet (25 mg total) by mouth every 6 (six) hours. 09/12/14   Charlesetta Shanks, MD  hydrochlorothiazide (HYDRODIURIL) 25 MG tablet Take 25 mg by mouth daily.    Historical Provider, MD  levothyroxine (SYNTHROID, LEVOTHROID) 100 MCG tablet Take 100 mcg by mouth daily before breakfast.    Historical Provider, MD  OVER THE COUNTER MEDICATION Take 1 capsule by mouth daily. coffee bean extract capsule     Historical Provider, MD  predniSONE (DELTASONE) 20 MG tablet 3 tabs po daily x 3 days, then 2 tabs x 3 days, then 1.5 tabs x 3 days, then 1 tab x 3 days, then 0.5 tabs x 3 days 09/12/14   Charlesetta Shanks, MD  TOPROL XL 50 MG 24 hr tablet 1 tablet Daily. 10/25/10   Historical Provider, MD   BP 127/65 mmHg  Pulse 62  Temp(Src) 98.2 F (36.8 C) (Oral)  Resp 16  Ht 4' 11.5" (1.511 m)  Wt 200 lb (90.719 kg)  BMI 39.73 kg/m2  SpO2 100% Physical Exam  Constitutional: She is oriented to person, place, and time. She appears well-developed and well-nourished. No distress.  HENT:  Head: Normocephalic and atraumatic.  Nose: Nose normal.  Mouth/Throat: Oropharynx is clear and moist. No oropharyngeal exudate.  Eyes: EOM are normal. Pupils are equal, round, and reactive to light.  Arcus senilis.  Cardiovascular: Normal rate, regular rhythm, normal heart sounds and intact distal pulses.   Pulmonary/Chest: Effort normal and breath sounds normal.  Neurological: She is alert and oriented to person, place, and time. No cranial nerve deficit. Coordination normal.  Skin: Skin is warm and dry.  Psychiatric: She has a normal mood and affect.          ED Course  Procedures (including critical care  time) Labs Review Labs Reviewed - No data to display  Imaging Review No results found. I, Charlesetta Shanks, personally reviewed and evaluated these images and lab results as part of my medical decision-making.   EKG Interpretation None      MDM   Final diagnoses:  Dermatitis  Rash   At this time the rash has appearance of a contact dermatitis. There are fine bumps in several patches and distributions over the face and the neck. The area of the original rash is erythematous and flat. None of these are vesicular. At this time the patient has had a worsening with the use of ketoconazole. I will have her discontinue that and try a course of sterile for what appears to be a dermatitis of uncertain etiology. The patient is counseled to return her dermatologist for further diagnostic testing or biopsy if needed to identify the cause of her rash. She does not have associated constitutional symptoms or rash over the remainder of her body.    Charlesetta Shanks, MD 09/12/14 [phone]

## 2014-09-17 DIAGNOSIS — L239 Allergic contact dermatitis, unspecified cause: Secondary | ICD-10-CM | POA: Diagnosis not present

## 2014-09-29 DIAGNOSIS — N183 Chronic kidney disease, stage 3 (moderate): Secondary | ICD-10-CM | POA: Diagnosis not present

## 2014-09-29 DIAGNOSIS — Z23 Encounter for immunization: Secondary | ICD-10-CM | POA: Diagnosis not present

## 2014-09-29 DIAGNOSIS — Z79899 Other long term (current) drug therapy: Secondary | ICD-10-CM | POA: Diagnosis not present

## 2014-09-29 DIAGNOSIS — K219 Gastro-esophageal reflux disease without esophagitis: Secondary | ICD-10-CM | POA: Diagnosis not present

## 2014-09-29 DIAGNOSIS — R8299 Other abnormal findings in urine: Secondary | ICD-10-CM | POA: Diagnosis not present

## 2014-09-29 DIAGNOSIS — Z0000 Encounter for general adult medical examination without abnormal findings: Secondary | ICD-10-CM | POA: Diagnosis not present

## 2014-09-29 DIAGNOSIS — E785 Hyperlipidemia, unspecified: Secondary | ICD-10-CM | POA: Diagnosis not present

## 2014-09-29 DIAGNOSIS — E119 Type 2 diabetes mellitus without complications: Secondary | ICD-10-CM | POA: Diagnosis not present

## 2014-09-29 DIAGNOSIS — E039 Hypothyroidism, unspecified: Secondary | ICD-10-CM | POA: Diagnosis not present

## 2014-09-30 DIAGNOSIS — Z961 Presence of intraocular lens: Secondary | ICD-10-CM | POA: Diagnosis not present

## 2014-09-30 DIAGNOSIS — H26492 Other secondary cataract, left eye: Secondary | ICD-10-CM | POA: Diagnosis not present

## 2014-09-30 DIAGNOSIS — E119 Type 2 diabetes mellitus without complications: Secondary | ICD-10-CM | POA: Diagnosis not present

## 2014-10-08 DIAGNOSIS — L810 Postinflammatory hyperpigmentation: Secondary | ICD-10-CM | POA: Diagnosis not present

## 2014-10-08 DIAGNOSIS — L239 Allergic contact dermatitis, unspecified cause: Secondary | ICD-10-CM | POA: Diagnosis not present

## 2014-11-05 ENCOUNTER — Ambulatory Visit: Payer: Self-pay | Admitting: Neurology

## 2014-11-06 ENCOUNTER — Encounter: Payer: Self-pay | Admitting: Gastroenterology

## 2015-01-20 ENCOUNTER — Encounter: Payer: Self-pay | Admitting: Allergy and Immunology

## 2015-01-20 ENCOUNTER — Ambulatory Visit (INDEPENDENT_AMBULATORY_CARE_PROVIDER_SITE_OTHER): Payer: Medicare Other | Admitting: Allergy and Immunology

## 2015-01-20 VITALS — BP 128/74 | HR 92 | Temp 97.8°F | Resp 20 | Ht 58.66 in | Wt 210.5 lb

## 2015-01-20 DIAGNOSIS — L309 Dermatitis, unspecified: Secondary | ICD-10-CM

## 2015-01-20 DIAGNOSIS — L500 Allergic urticaria: Secondary | ICD-10-CM | POA: Insufficient documentation

## 2015-01-20 MED ORDER — METHYLPREDNISOLONE ACETATE 80 MG/ML IJ SUSP
80.0000 mg | Freq: Once | INTRAMUSCULAR | Status: AC
  Administered 2015-01-20: 80 mg via INTRAMUSCULAR

## 2015-01-20 MED ORDER — MOMETASONE FUROATE 0.1 % EX OINT: TOPICAL_OINTMENT | Freq: Every day | CUTANEOUS | Status: DC

## 2015-01-20 MED ORDER — MONTELUKAST SODIUM 10 MG PO TABS: 10.0000 mg | ORAL_TABLET | Freq: Every day | ORAL | Status: DC

## 2015-01-20 MED ORDER — DERMA-SMOOTHE/FS BODY 0.01 % EX OIL: TOPICAL_OIL | CUTANEOUS | Status: DC

## 2015-01-20 NOTE — Patient Instructions (Signed)
  1. Stop over-the-counter oil used for hair  2. Use Derma-Smoothe scalp oil 3-7 times per week as needed  3. Use mometasone 0.1% ointment applied to skin 3-7 times per week as needed  4. Cetirizine 10 mg one tablet one time per day  5. Montelukast 10 mg one tablet one time per day  6. Depo-Medrol 80 mg IM delivered in the clinic  7. Blood- CBC with differential, CMP, sed, urine analysis

## 2015-01-20 NOTE — Progress Notes (Signed)
[place]    NEW PATIENT NOTE  Referring Provider: Antony Blackbird, MD Primary Provider: Antony Blackbird, MD    Subjective:   Chief Complaint:  Lynn Ray is a 80 y.o. female with a chief complaint of Pruritis and Rash  who presents to the clinic with the following problems:  HPI Comments:  [name] presents to this clinic on 01/20/2015 in evaluation of dermatitis. Starting in October 2016 she started to develop red raised itchy lesions across her face and scalp and anterior chest and neck. She was seen in the emergency room and Dr. Allyson Sabal and received various topical agents which she's not sure is helped her very much. She's not really sure if these red raised areas ever completely resolved but they certainly of waxed and waned and at points in time they're extremely red and swollen and it points in time they're just extremely itchy. She has no other associated systemic or constitutional symptoms. She has been feeling a little bit fatigued lately and just has a "body ache" that may be relatively new. She has no associated atopic disease. There is no obvious provoking factor giving rise to her issue although it should be noted that she is using an over-the-counter body and scalp oil for the past 6 months. She uses this oil daily.   Past Medical History  Diagnosis Date  . Arthritis   . Cancer ([place])     colon  . Cataract     right eye  . Depression   . Hypertension   . Chiari malformation   . Thyroid disease     hyporthyroid  . Sleep apnea     no CPAP  . Diabetes mellitus [facility])     Past Surgical History  Procedure Laterality Date  . Colonoscopy    . Flexible sigmoidoscopy    . Polypectomy    . Chiari malformation  2013    surgery  . Cataract extraction Bilateral     Current Outpatient Prescriptions on File Prior to Visit  Medication Sig Dispense Refill  . hydrochlorothiazide (HYDRODIURIL) 25 MG tablet Take 25 mg by  mouth daily.    Marland Kitchen levothyroxine (SYNTHROID, LEVOTHROID) 100 MCG tablet Take 100 mcg by mouth daily before breakfast.    . TOPROL XL 50 MG 24 hr tablet 1 tablet Daily.    . diphenhydrAMINE (BENADRYL) 25 MG tablet Take 1 tablet (25 mg total) by mouth every 6 (six) hours. (Patient not taking: Reported on 01/20/2015) 20 tablet 0  . OVER THE COUNTER MEDICATION Take 1 capsule by mouth daily. Reported on 01/20/2015    . predniSONE (DELTASONE) 20 MG tablet 3 tabs po daily x 3 days, then 2 tabs x 3 days, then 1.5 tabs x 3 days, then 1 tab x 3 days, then 0.5 tabs x 3 days (Patient not taking: Reported on 01/20/2015) 27 tablet 0   No current facility-administered medications on file prior to visit.    Meds ordered this encounter  Medications  . montelukast (SINGULAIR) 10 MG tablet    Sig: Take 1 tablet (10 mg total) by mouth at bedtime.    Dispense:  30 tablet    Refill:  5  . mometasone (ELOCON) 0.1 % ointment    Sig: Apply topically daily. Apply to skin 3-7 times per week as needed.    Dispense:  45 g    Refill:  4  . Fluocinolone Acetonide (DERMA-SMOOTHE/FS BODY) 0.01 % OIL  Sig: Apply to scalp 3-7 times per week as needed.    Dispense:  1 Bottle    Refill:  3  . methylPREDNISolone acetate (DEPO-MEDROL) injection 80 mg    Sig:     Allergies  Allergen Reactions  . Aspirin Other (See Comments)    Severe abdominal pain  . Tape Rash    adhesive    Review of Systems  Constitutional: Positive for fatigue. Negative for fever and chills.  HENT: Negative for congestion, ear pain, facial swelling, hearing loss, nosebleeds, postnasal drip, rhinorrhea, sinus pressure, sneezing, sore throat, tinnitus, trouble swallowing and voice change.   Eyes: Negative for pain, discharge, redness and itching.  Respiratory: Negative for cough, chest tightness, shortness of breath and wheezing.   Cardiovascular: Negative for chest pain and leg swelling.       Systemic arterial hypertension  Gastrointestinal:  Negative for nausea, vomiting and abdominal pain.  Endocrine: Negative for cold intolerance and heat intolerance.       Hypothyroidism  Musculoskeletal: Positive for myalgias. Negative for arthralgias.  Skin: Positive for rash (History of present illness).  Allergic/Immunologic: Negative.   Neurological: Negative for dizziness and headaches.  Hematological: Negative for adenopathy. Does not bruise/bleed easily.  Psychiatric/Behavioral: Negative.     Family History  Problem Relation Age of Onset  . Colon cancer Neg Hx   . Esophageal cancer Neg Hx   . Stomach cancer Neg Hx   . Depression    . Anemia    . Obesity    . Alzheimer's disease Mother   . Stroke Mother     mini  . Heart attack Mother   . Hypertension Mother     Social History   Social History  . Marital Status: Married    Spouse Name: Eddie Dibbles  . Number of Children: 2  . Years of Education: Masters   Occupational History  .      retired   Social History Main Topics  . Smoking status: Former Smoker -- 0.25 packs/day  . Smokeless tobacco: Never Used     Comment: 04/2013,quit  . Alcohol Use: No  . Drug Use: No  . Sexual Activity: Not on file   Other Topics Concern  . Not on file   Social History Narrative   Patient consumes one cup of coffee daily    Environmental and Social history  Lives in a house with a dry [place], a cat located inside the household, carpeting in the bedroom, no plastic on the bed or pillow, and no smokers located inside the household   Objective:   Filed Vitals:   01/20/15 0942  BP: 128/74  Pulse: 92  Temp: 97.8 F (36.6 C)  Resp: 20   Height: 4' 10.66" (149 cm) Weight: 210 lb 8.6 oz (95.5 kg)  Physical Exam  Constitutional: She appears well-developed and well-nourished. No distress.  HENT:  Head: Normocephalic and atraumatic. Head is without right periorbital erythema and without left periorbital erythema.  Right Ear: Tympanic membrane, external ear and ear canal normal.  No drainage or tenderness. No foreign bodies. Tympanic membrane is not injected, not scarred, not perforated, not erythematous, not retracted and not bulging. No middle ear effusion.  Left Ear: Tympanic membrane, external ear and ear canal normal. No drainage or tenderness. No foreign bodies. Tympanic membrane is not injected, not scarred, not perforated, not erythematous, not retracted and not bulging.  No middle ear effusion.  Nose: Nose normal. No mucosal edema, rhinorrhea, nose lacerations or sinus tenderness.  No foreign bodies.  Mouth/Throat: Oropharynx is clear and moist. No oropharyngeal exudate, posterior oropharyngeal edema, posterior oropharyngeal erythema or tonsillar abscesses.  Eyes: Lids are normal. Right eye exhibits no chemosis, no discharge and no exudate. No foreign body present in the right eye. Left eye exhibits no chemosis, no discharge and no exudate. No foreign body present in the left eye. Right conjunctiva is not injected. Left conjunctiva is not injected.  Neck: Neck supple. No tracheal tenderness present. No tracheal deviation and no edema present. No thyroid mass and no thyromegaly present.  Cardiovascular: Normal rate, regular rhythm, S1 normal and S2 normal.  Exam reveals no gallop.   No murmur heard. Pulmonary/Chest: No accessory muscle usage or stridor. No respiratory distress. She has no wheezes. She has no rhonchi. She has no rales.  Abdominal: Soft. She exhibits no distension and no mass. There is no tenderness. There is no rebound and no guarding.  Musculoskeletal: She exhibits no edema.  Lymphadenopathy:       Head (right side): No tonsillar adenopathy present.       Head (left side): No tonsillar adenopathy present.    She has no cervical adenopathy.  Neurological: She is alert.  Skin: Rash noted. She is not diaphoretic.  Excoriated slightly erythematous grouped papules involving anterior chest. Slightly erythematous hyperpigmented slightly lichenified patches  involving cheeks and temporal region bilaterally. Erythematous slightly indurated patches involving scalp with evidence of excoriation  Psychiatric: She has a normal mood and affect. Her behavior is normal.    Diagnostics: None  Assessment and Plan:    1. Allergic urticaria   2. Eczematous dermatitis      1. Stop over-the-counter oil used for hair  2. Use Derma-Smoothe scalp oil 3-7 times per week as needed  3. Use mometasone 0.1% ointment applied to skin 3-7 times per week as needed  4. Cetirizine 10 mg one tablet one time per day  5. Montelukast 10 mg one tablet one time per day  6. Depo-Medrol 80 mg IM delivered in the clinic  7. Blood- CBC with differential, CMP, sed, urine analysis   It is quite possible that Hamda is having a reaction to her over-the-counter hair oil treatment and we'll have her eliminate this exposure. We'll screen her blood for worrisome systemic disease contributing to her immunological hyperreactivity and I've given her some topical anti-inflammatory agents to use at areas that are still inflamed in the face of the treatment mentioned above. I will regroup with her in a possibly 4 weeks and we'll make a determination at that point in time about how to proceed. I will contact her with the results of her blood tests once they're available for review.    Allena Katz, MD [place]

## 2015-01-21 LAB — COMPREHENSIVE METABOLIC PANEL
ALBUMIN: 4 g/dL (ref 3.5–4.8)
ALK PHOS: 108 IU/L (ref 39–117)
ALT: 12 IU/L (ref 0–32)
AST: 16 IU/L (ref 0–40)
Albumin/Globulin Ratio: 1.3 (ref 1.1–2.5)
BUN / CREAT RATIO: 11 (ref 11–26)
BUN: 16 mg/dL (ref 8–27)
Bilirubin Total: <0.2 mg/dL (ref 0.0–1.2)
CO2: 22 mmol/L (ref 18–29)
CREATININE: 1.43 mg/dL — AB (ref 0.57–1.00)
Calcium: 9.9 mg/dL (ref 8.7–10.3)
Chloride: 103 mmol/L (ref 96–106)
GFR calc Af Amer: 40 mL/min/{1.73_m2} — ABNORMAL LOW (ref >59)
GFR calc non Af Amer: 35 mL/min/{1.73_m2} — ABNORMAL LOW (ref >59)
GLUCOSE: 94 mg/dL (ref 65–99)
Globulin, Total: 3.2 g/dL (ref 1.5–4.5)
Potassium: 4.2 mmol/L (ref 3.5–5.2)
SODIUM: 140 mmol/L (ref 134–144)
Total Protein: 7.2 g/dL (ref 6.0–8.5)

## 2015-01-21 LAB — URINALYSIS
Bilirubin, UA: NEGATIVE
Glucose, UA: NEGATIVE
KETONES UA: NEGATIVE
LEUKOCYTES UA: NEGATIVE
NITRITE UA: NEGATIVE
PH UA: 5.5 (ref 5.0–7.5)
RBC, UA: NEGATIVE
Specific Gravity, UA: 1.021 (ref 1.005–1.030)
UUROB: 0.2 mg/dL (ref 0.2–1.0)

## 2015-01-21 LAB — CBC WITH DIFFERENTIAL/PLATELET
BASOS: 1 %
Basophils Absolute: 0 10*3/uL (ref 0.0–0.2)
EOS (ABSOLUTE): 0.3 10*3/uL (ref 0.0–0.4)
Eos: 5 %
HEMOGLOBIN: 12 g/dL (ref 11.1–15.9)
Hematocrit: 36.4 % (ref 34.0–46.6)
IMMATURE GRANS (ABS): 0 10*3/uL (ref 0.0–0.1)
IMMATURE GRANULOCYTES: 0 %
LYMPHS: 36 %
Lymphocytes Absolute: 2 10*3/uL (ref 0.7–3.1)
MCH: 28 pg (ref 26.6–33.0)
MCHC: 33 g/dL (ref 31.5–35.7)
MCV: 85 fL (ref 79–97)
MONOCYTES: 7 %
Monocytes Absolute: 0.4 10*3/uL (ref 0.1–0.9)
NEUTROS PCT: 51 %
Neutrophils Absolute: 2.9 10*3/uL (ref 1.4–7.0)
PLATELETS: 320 10*3/uL (ref 150–379)
RBC: 4.28 x10E6/uL (ref 3.77–5.28)
RDW: 14 % (ref 12.3–15.4)
WBC: 5.6 10*3/uL (ref 3.4–10.8)

## 2015-01-21 LAB — SEDIMENTATION RATE: SED RATE: 50 mm/h — AB (ref 0–40)

## 2015-02-02 NOTE — Progress Notes (Signed)
PT is aware of the kidney levels and is seeing a kidney specialist.

## 2015-04-08 ENCOUNTER — Other Ambulatory Visit: Payer: Self-pay

## 2015-04-08 DIAGNOSIS — N183 Chronic kidney disease, stage 3 (moderate): Secondary | ICD-10-CM | POA: Diagnosis not present

## 2015-04-08 DIAGNOSIS — M2550 Pain in unspecified joint: Secondary | ICD-10-CM | POA: Diagnosis not present

## 2015-04-08 DIAGNOSIS — D509 Iron deficiency anemia, unspecified: Secondary | ICD-10-CM | POA: Diagnosis not present

## 2015-04-08 DIAGNOSIS — Z1231 Encounter for screening mammogram for malignant neoplasm of breast: Secondary | ICD-10-CM

## 2015-04-22 ENCOUNTER — Ambulatory Visit
Admission: RE | Admit: 2015-04-22 | Discharge: 2015-04-22 | Disposition: A | Discharge status: Home / Self Care | Payer: Medicare Other | Source: Ambulatory Visit

## 2015-04-22 DIAGNOSIS — Z1231 Encounter for screening mammogram for malignant neoplasm of breast: Secondary | ICD-10-CM | POA: Diagnosis not present

## 2015-04-22 LAB — HM MAMMOGRAPHY

## 2015-07-06 DIAGNOSIS — R21 Rash and other nonspecific skin eruption: Secondary | ICD-10-CM | POA: Diagnosis not present

## 2015-07-29 DIAGNOSIS — M549 Dorsalgia, unspecified: Secondary | ICD-10-CM | POA: Diagnosis not present

## 2015-08-02 ENCOUNTER — Ambulatory Visit
Admission: RE | Admit: 2015-08-02 | Discharge: 2015-08-02 | Disposition: A | Discharge status: Home / Self Care | Payer: Medicare Other | Source: Ambulatory Visit | Attending: Family Medicine | Admitting: Family Medicine

## 2015-08-02 ENCOUNTER — Other Ambulatory Visit: Payer: Self-pay | Admitting: Family Medicine

## 2015-08-02 DIAGNOSIS — M549 Dorsalgia, unspecified: Secondary | ICD-10-CM

## 2015-08-02 DIAGNOSIS — M47814 Spondylosis without myelopathy or radiculopathy, thoracic region: Secondary | ICD-10-CM | POA: Diagnosis not present

## 2015-08-02 DIAGNOSIS — M47816 Spondylosis without myelopathy or radiculopathy, lumbar region: Secondary | ICD-10-CM | POA: Diagnosis not present

## 2015-08-19 DIAGNOSIS — H10021 Other mucopurulent conjunctivitis, right eye: Secondary | ICD-10-CM | POA: Diagnosis not present

## 2015-08-19 DIAGNOSIS — Z961 Presence of intraocular lens: Secondary | ICD-10-CM | POA: Diagnosis not present

## 2015-08-19 DIAGNOSIS — H26491 Other secondary cataract, right eye: Secondary | ICD-10-CM | POA: Diagnosis not present

## 2015-08-27 ENCOUNTER — Encounter: Payer: Self-pay | Admitting: Gastroenterology

## 2015-08-31 DIAGNOSIS — L258 Unspecified contact dermatitis due to other agents: Secondary | ICD-10-CM | POA: Diagnosis not present

## 2015-09-30 DIAGNOSIS — Z0000 Encounter for general adult medical examination without abnormal findings: Secondary | ICD-10-CM | POA: Diagnosis not present

## 2015-09-30 DIAGNOSIS — E785 Hyperlipidemia, unspecified: Secondary | ICD-10-CM | POA: Diagnosis not present

## 2015-09-30 DIAGNOSIS — Z23 Encounter for immunization: Secondary | ICD-10-CM | POA: Diagnosis not present

## 2015-09-30 DIAGNOSIS — Z85038 Personal history of other malignant neoplasm of large intestine: Secondary | ICD-10-CM | POA: Diagnosis not present

## 2015-09-30 DIAGNOSIS — I10 Essential (primary) hypertension: Secondary | ICD-10-CM | POA: Diagnosis not present

## 2015-09-30 DIAGNOSIS — E119 Type 2 diabetes mellitus without complications: Secondary | ICD-10-CM | POA: Diagnosis not present

## 2015-09-30 DIAGNOSIS — D509 Iron deficiency anemia, unspecified: Secondary | ICD-10-CM | POA: Diagnosis not present

## 2015-09-30 DIAGNOSIS — E039 Hypothyroidism, unspecified: Secondary | ICD-10-CM | POA: Diagnosis not present

## 2015-10-14 DIAGNOSIS — H26491 Other secondary cataract, right eye: Secondary | ICD-10-CM | POA: Diagnosis not present

## 2015-10-14 DIAGNOSIS — Z961 Presence of intraocular lens: Secondary | ICD-10-CM | POA: Diagnosis not present

## 2015-10-14 DIAGNOSIS — E119 Type 2 diabetes mellitus without complications: Secondary | ICD-10-CM | POA: Diagnosis not present

## 2015-10-27 ENCOUNTER — Ambulatory Visit (AMBULATORY_SURGERY_CENTER): Payer: Self-pay | Admitting: *Deleted

## 2015-10-27 VITALS — Ht 59.5 in | Wt 220.0 lb

## 2015-10-27 DIAGNOSIS — Z85048 Personal history of other malignant neoplasm of rectum, rectosigmoid junction, and anus: Secondary | ICD-10-CM

## 2015-10-27 MED ORDER — NA SULFATE-K SULFATE-MG SULF 17.5-3.13-1.6 GM/177ML PO SOLN: ORAL | 0 refills | Status: DC

## 2015-10-27 NOTE — Progress Notes (Signed)
Patient denies any allergies to eggs or soy. Patient denies any problems with anesthesia/sedation. Patient denies any oxygen use at home and does not take any diet/weight loss medications. Patient was signed up for and given EMMI education instructions.

## 2015-10-29 DIAGNOSIS — M170 Bilateral primary osteoarthritis of knee: Secondary | ICD-10-CM | POA: Diagnosis not present

## 2015-11-10 ENCOUNTER — Encounter: Payer: Self-pay | Admitting: Gastroenterology

## 2015-11-10 ENCOUNTER — Ambulatory Visit (AMBULATORY_SURGERY_CENTER): Payer: Medicare Other | Admitting: Gastroenterology

## 2015-11-10 VITALS — BP 134/73 | HR 69 | Temp 97.3°F | Resp 12 | Ht 59.0 in | Wt 220.0 lb

## 2015-11-10 DIAGNOSIS — D123 Benign neoplasm of transverse colon: Secondary | ICD-10-CM

## 2015-11-10 DIAGNOSIS — Z85048 Personal history of other malignant neoplasm of rectum, rectosigmoid junction, and anus: Secondary | ICD-10-CM

## 2015-11-10 DIAGNOSIS — D126 Benign neoplasm of colon, unspecified: Secondary | ICD-10-CM | POA: Diagnosis not present

## 2015-11-10 DIAGNOSIS — K635 Polyp of colon: Secondary | ICD-10-CM

## 2015-11-10 DIAGNOSIS — J449 Chronic obstructive pulmonary disease, unspecified: Secondary | ICD-10-CM | POA: Diagnosis not present

## 2015-11-10 MED ORDER — SODIUM CHLORIDE 0.9 % IV SOLN: 500.0000 mL | INTRAVENOUS | Status: DC

## 2015-11-10 NOTE — Patient Instructions (Signed)
YOU HAD AN ENDOSCOPIC PROCEDURE TODAY AT [facility]:   Refer to the procedure report that was given to you for any specific questions about what was found during the examination.  If the procedure report does not answer your questions, please call your gastroenterologist to clarify.  If you requested that your care partner not be given the details of your procedure findings, then the procedure report has been included in a sealed envelope for you to review at your convenience later.  YOU SHOULD EXPECT: Some feelings of bloating in the abdomen. Passage of more gas than usual.  Walking can help get rid of the air that was put into your GI tract during the procedure and reduce the bloating. If you had a lower endoscopy (such as a colonoscopy or flexible sigmoidoscopy) you may notice spotting of blood in your stool or on the toilet paper. If you underwent a bowel prep for your procedure, you may not have a normal bowel movement for a few days.  Please Note:  You might notice some irritation and congestion in your nose or some drainage.  This is from the oxygen used during your procedure.  There is no need for concern and it should clear up in a day or so.  SYMPTOMS TO REPORT IMMEDIATELY:   Following lower endoscopy (colonoscopy or flexible sigmoidoscopy):  Excessive amounts of blood in the stool  Significant tenderness or worsening of abdominal pains  Swelling of the abdomen that is new, acute  Fever of 100F or higher   Following upper endoscopy (EGD)  Vomiting of blood or coffee ground material  New chest pain or pain under the shoulder blades  Painful or persistently difficult swallowing  New shortness of breath  Fever of 100F or higher  Black, tarry-looking stools  For urgent or emergent issues, a gastroenterologist can be reached at any hour by calling [phone].   DIET:  We do recommend a small meal at first, but then you may proceed to your regular diet.  Drink  plenty of fluids but you should avoid alcoholic beverages for 24 hours.  ACTIVITY:  You should plan to take it easy for the rest of today and you should NOT DRIVE or use heavy machinery until tomorrow (because of the sedation medicines used during the test).    FOLLOW UP: Our staff will call the number listed on your records the next business day following your procedure to check on you and address any questions or concerns that you may have regarding the information given to you following your procedure. If we do not reach you, we will leave a message.  However, if you are feeling well and you are not experiencing any problems, there is no need to return our call.  We will assume that you have returned to your regular daily activities without incident.  If any biopsies were taken you will be contacted by phone or by letter within the next 1-3 weeks.  Please call us at [phone] if you have not heard about the biopsies in 3 weeks.    SIGNATURES/CONFIDENTIALITY: You and/or your care partner have signed paperwork which will be entered into your electronic medical record.  These signatures attest to the fact that that the information above on your After Visit Summary has been reviewed and is understood.  Full responsibility of the confidentiality of this discharge information lies with you and/or your care-partner.  Polyp, diverticulosis, high fiber diet, and hemorrhoid information given.

## 2015-11-10 NOTE — Progress Notes (Signed)
Called to room to assist during endoscopic procedure.  Patient ID and intended procedure confirmed with present staff. Received instructions for my participation in the procedure from the performing physician.  

## 2015-11-10 NOTE — Op Note (Signed)
[place] Patient Name: Lynn Ray Procedure Date: 11/10/2015 10:29 AM MRN: [id number] Endoscopist: Mauri Pole , MD Age: 81 Referring MD:  Date of Birth: 02/21/1934 Gender: Female Account #: [id number] Procedure:                Colonoscopy Indications:              High risk colon cancer surveillance: Personal                            history of colon cancer 2004, Last colonoscopy: 2012 Medicines:                Monitored Anesthesia Care Procedure:                Pre-Anesthesia Assessment:                           - Prior to the procedure, a History and Physical                            was performed, and patient medications and                            allergies were reviewed. The patient's tolerance of                            previous anesthesia was also reviewed. The risks                            and benefits of the procedure and the sedation                            options and risks were discussed with the patient.                            All questions were answered, and informed consent                            was obtained. Prior Anticoagulants: The patient has                            taken no previous anticoagulant or antiplatelet                            agents. ASA Grade Assessment: II - A patient with                            mild systemic disease. After reviewing the risks                            and benefits, the patient was deemed in                            satisfactory condition to undergo the procedure.  After obtaining informed consent, the colonoscope                            was passed under direct vision. Throughout the                            procedure, the patient's blood pressure, pulse, and                            oxygen saturations were monitored continuously. The                            Model CF-HQ190L [id number]) scope was introduced                            through  the anus and advanced to the the cecum,                            identified by appendiceal orifice and ileocecal                            valve. The colonoscopy was performed without                            difficulty. The patient tolerated the procedure                            well. The quality of the bowel preparation was                            good. The ileocecal valve, appendiceal orifice, and                            rectum were photographed. Scope In: 10:34:26 AM Scope Out: 10:49:51 AM Scope Withdrawal Time: 0 hours 10 minutes 49 seconds  Total Procedure Duration: 0 hours 15 minutes 25 seconds  Findings:                 The perianal and digital rectal examinations were                            normal.                           Three sessile polyps were found in the transverse                            colon. The polyps were 4 to 7 mm in size. These                            polyps were removed with a cold snare. Resection                            and retrieval were complete.  A 3 mm polyp was found in the transverse colon. The                            polyp was sessile. The polyp was removed with a                            cold biopsy forceps. Resection and retrieval were                            complete.                           Multiple small and large-mouthed diverticula were                            found in the sigmoid colon and descending colon.                           Non-bleeding internal hemorrhoids were found during                            retroflexion. The hemorrhoids were small. Complications:            No immediate complications. Estimated Blood Loss:     Estimated blood loss was minimal. Impression:               - Three 4 to 7 mm polyps in the transverse colon,                            removed with a cold snare. Resected and retrieved.                           - One 3 mm polyp in the transverse colon,  removed                            with a cold biopsy forceps. Resected and retrieved.                           - Diverticulosis in the sigmoid colon and in the                            descending colon.                           -Noted area of prior tattoo in the rectum                           - Non-bleeding internal hemorrhoids. Recommendation:           - Patient has a contact number available for                            emergencies. The signs and symptoms of potential  delayed complications were discussed with the                            patient. Return to normal activities tomorrow.                            Written discharge instructions were provided to the                            patient.                           - Resume previous diet.                           - Continue present medications.                           - Await pathology results.                           - Repeat colonoscopy in 3 - 5 years for                            surveillance based on pathology results.                           - Return to GI clinic PRN. Mauri Pole, MD 11/10/2015 11:05:26 AM This report has been signed electronically.

## 2015-11-10 NOTE — Progress Notes (Signed)
A/ox3 pleased with MAC, report to Jane RN 

## 2015-11-11 ENCOUNTER — Telehealth: Payer: Self-pay | Admitting: *Deleted

## 2015-11-11 NOTE — Telephone Encounter (Signed)
  Follow up Call-  Call back number 11/10/2015  Post procedure Call Back phone  # [phone]  Permission to leave phone message Yes  Some recent data might be hidden     Patient questions:  Do you have a fever, pain , or abdominal swelling? No. Pain Score  0 *  Have you tolerated food without any problems? Yes.    Have you been able to return to your normal activities? Yes.    Do you have any questions about your discharge instructions: Diet   No. Medications  No. Follow up visit  No.  Do you have questions or concerns about your Care? No.  Actions: * If pain score is 4 or above: No action needed, pain <4.

## 2015-11-22 ENCOUNTER — Encounter: Payer: Self-pay | Admitting: Gastroenterology

## 2016-06-01 ENCOUNTER — Other Ambulatory Visit: Payer: Self-pay | Admitting: Family

## 2016-06-01 DIAGNOSIS — Z1231 Encounter for screening mammogram for malignant neoplasm of breast: Secondary | ICD-10-CM

## 2016-06-13 ENCOUNTER — Telehealth: Payer: Self-pay | Admitting: Internal Medicine

## 2016-06-13 NOTE — Telephone Encounter (Signed)
This pt was previously seeing Eloise Levels, Np at [facility] at [facility]. She received a letter stating that she will no longer be seeing pts at that office and the pt was wanting to see if you would take her on. Her husband Sheneka Schrom) is a current pt of yours. Would you be willing to see her? Please advise.

## 2016-06-14 NOTE — Telephone Encounter (Signed)
Fine to schedule new patient visit

## 2016-06-14 NOTE — Telephone Encounter (Signed)
Appointment scheduled.

## 2016-06-15 ENCOUNTER — Ambulatory Visit (INDEPENDENT_AMBULATORY_CARE_PROVIDER_SITE_OTHER): Payer: Medicare Other | Admitting: Internal Medicine

## 2016-06-15 ENCOUNTER — Encounter: Payer: Self-pay | Admitting: Internal Medicine

## 2016-06-15 ENCOUNTER — Other Ambulatory Visit (INDEPENDENT_AMBULATORY_CARE_PROVIDER_SITE_OTHER): Payer: Medicare Other

## 2016-06-15 VITALS — BP 130/70 | HR 75 | Temp 97.9°F | Resp 14 | Ht 59.0 in | Wt 211.0 lb

## 2016-06-15 DIAGNOSIS — E119 Type 2 diabetes mellitus without complications: Secondary | ICD-10-CM

## 2016-06-15 DIAGNOSIS — I10 Essential (primary) hypertension: Secondary | ICD-10-CM

## 2016-06-15 DIAGNOSIS — Z862 Personal history of diseases of the blood and blood-forming organs and certain disorders involving the immune mechanism: Secondary | ICD-10-CM

## 2016-06-15 DIAGNOSIS — E039 Hypothyroidism, unspecified: Secondary | ICD-10-CM | POA: Diagnosis not present

## 2016-06-15 LAB — COMPREHENSIVE METABOLIC PANEL
ALBUMIN: 4.1 g/dL (ref 3.5–5.2)
ALT: 20 U/L (ref 0–35)
AST: 21 U/L (ref 0–37)
Alkaline Phosphatase: 102 U/L (ref 39–117)
BUN: 20 mg/dL (ref 6–23)
CHLORIDE: 106 meq/L (ref 96–112)
CO2: 26 mEq/L (ref 19–32)
Calcium: 10.1 mg/dL (ref 8.4–10.5)
Creatinine, Ser: 1.6 mg/dL — ABNORMAL HIGH (ref 0.40–1.20)
GFR: 39.93 mL/min — AB (ref >60.00)
Glucose, Bld: 98 mg/dL (ref 70–99)
POTASSIUM: 4.2 meq/L (ref 3.5–5.1)
SODIUM: 140 meq/L (ref 135–145)
TOTAL PROTEIN: 7.6 g/dL (ref 6.0–8.3)
Total Bilirubin: 0.3 mg/dL (ref 0.2–1.2)

## 2016-06-15 LAB — HEMOGLOBIN A1C: Hgb A1c MFr Bld: 6.8 % — ABNORMAL HIGH (ref 4.6–6.5)

## 2016-06-15 LAB — CBC
HEMATOCRIT: 37.6 % (ref 36.0–46.0)
HEMOGLOBIN: 12.4 g/dL (ref 12.0–15.0)
MCHC: 32.8 g/dL (ref 30.0–36.0)
MCV: 84.8 fl (ref 78.0–100.0)
Platelets: 297 10*3/uL (ref 150.0–400.0)
RBC: 4.43 Mil/uL (ref 3.87–5.11)
RDW: 13.7 % (ref 11.5–15.5)
WBC: 6.1 10*3/uL (ref 4.0–10.5)

## 2016-06-15 LAB — LIPID PANEL
CHOLESTEROL: 179 mg/dL (ref 0–200)
HDL: 47.4 mg/dL (ref >39.00)
LDL CALC: 96 mg/dL (ref 0–99)
NonHDL: 131.51
TRIGLYCERIDES: 178 mg/dL — AB (ref 0.0–149.0)
Total CHOL/HDL Ratio: 4
VLDL: 35.6 mg/dL (ref 0.0–40.0)

## 2016-06-15 LAB — TSH: TSH: 0.07 u[IU]/mL — ABNORMAL LOW (ref 0.35–4.50)

## 2016-06-15 MED ORDER — HYDROCHLOROTHIAZIDE 25 MG PO TABS: 25.0000 mg | ORAL_TABLET | Freq: Every day | ORAL | 3 refills | Status: DC

## 2016-06-15 MED ORDER — METOPROLOL SUCCINATE ER 50 MG PO TB24: 50.0000 mg | ORAL_TABLET | Freq: Every day | ORAL | 3 refills | Status: DC

## 2016-06-15 MED ORDER — LEVOTHYROXINE SODIUM 100 MCG PO TABS: 100.0000 ug | ORAL_TABLET | Freq: Every day | ORAL | 3 refills | Status: DC

## 2016-06-15 NOTE — Progress Notes (Signed)
   Subjective:    Patient ID: Lynn Ray, female    DOB: 1934/02/15, 82 y.o.   MRN: [id number]  HPI The patient is a 82 YO female coming in for continuation of her medical conditions including her blood pressure (taking hctz and metoprolol with good control, denies side effects, denies headaches, chest pains, SOB), and her thyroid (taking synthoid 100 mcg daily, denies diarrhea or constipation or fatigue, no weight change although she has been trying to lose weight without success for some time now), and her morbid obesity (she is working on weight loss with diet and exercise but no success in losing weight, she does have family history of diabetes and is wanting to make sure that she does not have that). No new concerns.   PMH, [facility], social history reviewed and updated.   Review of Systems  Constitutional: Negative.   HENT: Negative.   Eyes: Negative.   Respiratory: Negative for cough, chest tightness and shortness of breath.   Cardiovascular: Negative for chest pain, palpitations and leg swelling.  Gastrointestinal: Negative for abdominal distention, abdominal pain, constipation, diarrhea, nausea and vomiting.  Musculoskeletal: Negative.   Skin: Negative.   Neurological: Negative.   Psychiatric/Behavioral: Negative.       Objective:   Physical Exam  Constitutional: She is oriented to person, place, and time. She appears well-developed and well-nourished.  Overweight  HENT:  Head: Normocephalic and atraumatic.  Eyes: EOM are normal.  Neck: Normal range of motion.  Cardiovascular: Normal rate and regular rhythm.   Pulmonary/Chest: Effort normal and breath sounds normal. No respiratory distress. She has no wheezes. She has no rales.  Abdominal: Soft. Bowel sounds are normal. She exhibits no distension. There is no tenderness. There is no rebound.  Musculoskeletal: She exhibits no edema.  Neurological: She is alert and oriented to person, place, and time. Coordination normal.  Skin:  Skin is warm and dry.  Psychiatric: She has a normal mood and affect.   Vitals:   06/15/16 1457  BP: 130/70  Pulse: 75  Resp: 14  Temp: 97.9 F (36.6 C)  TempSrc: Oral  SpO2: 98%  Weight: 211 lb (95.7 kg)  Height: 4\' 11"  (1.499 m)      Assessment & Plan:

## 2016-06-15 NOTE — Patient Instructions (Addendum)
We will check the labs today. We have sent in the refills for the medicines.   We will get the records from [place].    Diabetes Mellitus and Standards of Medical Care Managing diabetes (diabetes mellitus) can be complicated. Your diabetes treatment may be managed by a team of health care providers, including:  A diet and nutrition specialist (registered dietitian).  A nurse.  A certified diabetes educator (CDE).  A diabetes specialist (endocrinologist).  An eye doctor.  A primary care provider.  A dentist. Your health care providers follow a schedule in order to help you get the best quality of care. The following schedule is a general guideline for your diabetes management plan. Your health care providers may also give you more specific instructions. HbA1c ( hemoglobin A1c) test This test provides information about blood sugar (glucose) control over the previous 2-3 months. It is used to check whether your diabetes management plan needs to be adjusted.  If you are meeting your treatment goals, this test is done at least 2 times a year.  If you are not meeting treatment goals or if your treatment goals have changed, this test is done 4 times a year. Blood pressure test  This test is done at every routine medical visit. For most people, the goal is less than 130/80. Ask your health care provider what your goal blood pressure should be. Dental and eye exams  Visit your dentist two times a year.  If you have type 1 diabetes, get an eye exam 3-5 years after you are diagnosed, and then once a year after your first exam.  If you were diagnosed with type 1 diabetes as a child, get an eye exam when you are age 38 or older and have had diabetes for 3-5 years. After the first exam, you should get an eye exam once a year.  If you have type 2 diabetes, have an eye exam as soon as you are diagnosed, and then once a year after your first exam. Foot care exam  Visual foot exams are done at  every routine medical visit. The exams check for cuts, bruises, redness, blisters, sores, or other problems with the feet.  A complete foot exam is done by your health care provider once a year. This exam includes an inspection of the structure and skin of your feet, and a check of the pulses and sensation in your feet.  Type 1 diabetes: Get your first exam 3-5 years after diagnosis.  Type 2 diabetes: Get your first exam as soon as you are diagnosed.  Check your feet every day for cuts, bruises, redness, blisters, or sores. If you have any of these or other problems that are not healing, contact your health care provider. Kidney function test ( urine microalbumin)  This test is done once a year.  Type 1 diabetes: Get your first test 5 years after diagnosis.  Type 2 diabetes: Get your first test as soon as you are diagnosed.  If you have chronic kidney disease (CKD), get a serum creatinine and estimated glomerular filtration rate (eGFR) test once a year. Lipid profile (cholesterol, HDL, LDL, triglycerides)  This test should be done when you are diagnosed with diabetes, and every 5 years after the first test. If you are on medicines to lower your cholesterol, you may need to get this test done every year.  The goal for LDL is less than 100 mg/dL (5.5 mmol/L). If you are at high risk, the goal is less  than 70 mg/dL (3.9 mmol/L).    The goal for triglycerides is less than 150 mg/dL (8.3 mmol/L). Immunizations  The yearly flu (influenza) vaccine is recommended for everyone 6 months or older who has diabetes.  The pneumonia (pneumococcal) vaccine is recommended for everyone 2 years or older who has diabetes. If you are 52 or older, you may get the pneumonia vaccine as a series of two separate shots.  The hepatitis B vaccine is recommended for adults shortly after they have been diagnosed with diabetes.     Mental and emotional health  Screening for symptoms of eating disorders,  anxiety, and depression is recommended at the time of diagnosis and afterward as needed. If your screening shows that you have symptoms (you have a positive screening result), you may need further evaluation and be referred to a mental health care provider. Diabetes self-management education  Education about how to manage your diabetes is recommended at diagnosis and ongoing as needed. Treatment plan  Your treatment plan will be reviewed at every medical visit. Summary  Managing diabetes (diabetes mellitus) can be complicated. Your diabetes treatment may be managed by a team of health care providers.  Your health care providers follow a schedule in order to help you get the best quality of care.  Standards of care including having regular physical exams, blood tests, blood pressure monitoring, immunizations, screening tests, and education about how to manage your diabetes.  Your health care providers may also give you more specific instructions based on your individual health. This information is not intended to replace advice given to you by your health care provider. Make sure you discuss any questions you have with your health care provider. Document Released: 11/13/2008 Document Revised: 10/15/2015 Document Reviewed: 10/15/2015 Elsevier Interactive Patient Education  2017 [employer].

## 2016-06-18 NOTE — Assessment & Plan Note (Signed)
BP at goal on HCTZ and metoprolol. Checking CMP and adjust as needed.

## 2016-06-18 NOTE — Assessment & Plan Note (Signed)
Checking TSH and free T4 and adjust synthroid 100 mcg daily as needed. No signs of over or under replacement.

## 2016-06-18 NOTE — Assessment & Plan Note (Signed)
Checking CBC and investigate if needed. Unclear if this history was when she has her GI malignancy and was iron related and also unclear if this is still an active problem.

## 2016-06-18 NOTE — Assessment & Plan Note (Signed)
Complicated by hypertension and GERD. She is working on weight loss at this time and we talked about strategies.

## 2016-06-19 ENCOUNTER — Ambulatory Visit
Admission: RE | Admit: 2016-06-19 | Discharge: 2016-06-19 | Disposition: A | Discharge status: Home / Self Care | Payer: Medicare Other | Source: Ambulatory Visit | Attending: Family | Admitting: Family

## 2016-06-19 ENCOUNTER — Other Ambulatory Visit: Payer: Self-pay | Admitting: Internal Medicine

## 2016-06-19 DIAGNOSIS — Z1231 Encounter for screening mammogram for malignant neoplasm of breast: Secondary | ICD-10-CM

## 2016-06-22 ENCOUNTER — Telehealth: Payer: Self-pay | Admitting: Internal Medicine

## 2016-06-22 NOTE — Telephone Encounter (Signed)
ROI fax to [facility]

## 2016-07-06 NOTE — Telephone Encounter (Signed)
Rec'd from [facility] forwarded 35 pages to Dr. Pricilla Holm

## 2016-10-09 ENCOUNTER — Encounter: Payer: Self-pay | Admitting: Internal Medicine

## 2016-10-09 NOTE — Progress Notes (Signed)
Abstracted and sent to scan  

## 2016-12-20 ENCOUNTER — Telehealth: Payer: Self-pay

## 2016-12-20 ENCOUNTER — Telehealth: Payer: Self-pay | Admitting: Internal Medicine

## 2016-12-20 MED ORDER — ZOSTER VAC RECOMB ADJUVANTED 50 MCG/0.5ML IM SUSR: 0.5000 mL | Freq: Once | INTRAMUSCULAR | 1 refills | Status: AC

## 2016-12-20 NOTE — Telephone Encounter (Signed)
shingrix rx sent to [facility]

## 2016-12-20 NOTE — Telephone Encounter (Signed)
Sent to [facility]

## 2016-12-20 NOTE — Telephone Encounter (Signed)
Requesting approval for shingrix script to be sent to [facility] on [address].

## 2016-12-26 ENCOUNTER — Ambulatory Visit (INDEPENDENT_AMBULATORY_CARE_PROVIDER_SITE_OTHER): Payer: Medicare Other

## 2016-12-26 DIAGNOSIS — Z23 Encounter for immunization: Secondary | ICD-10-CM | POA: Diagnosis not present

## 2017-06-18 ENCOUNTER — Ambulatory Visit (INDEPENDENT_AMBULATORY_CARE_PROVIDER_SITE_OTHER): Payer: Medicare Other | Admitting: *Deleted

## 2017-06-18 VITALS — BP 136/68 | HR 82 | Resp 18 | Ht 59.0 in | Wt 206.0 lb

## 2017-06-18 DIAGNOSIS — Z23 Encounter for immunization: Secondary | ICD-10-CM

## 2017-06-18 DIAGNOSIS — Z780 Asymptomatic menopausal state: Secondary | ICD-10-CM | POA: Diagnosis not present

## 2017-06-18 DIAGNOSIS — Z0000 Encounter for general adult medical examination without abnormal findings: Secondary | ICD-10-CM

## 2017-06-18 DIAGNOSIS — E119 Type 2 diabetes mellitus without complications: Secondary | ICD-10-CM

## 2017-06-18 NOTE — Progress Notes (Signed)
Subjective:   Lynn Ray is a 83 y.o. female who presents for an Initial Medicare Annual Wellness Visit.  Review of Systems    No ROS.  Medicare Wellness Visit. Additional risk factors are reflected in the social history.   Cardiac Risk Factors include: advanced age (>68men, >70 women);diabetes mellitus;dyslipidemia;hypertension;obesity (BMI >30kg/m2) Sleep patterns: no sleep issues, feels rested on waking, does not get up to void, gets up 1-2 times nightly to void and sleeps 7 hours nightly.    Home Safety/Smoke Alarms: Feels safe in home. Smoke alarms in place.  Living environment; residence and Firearm Safety: split level / walkout, no firearms.Lives with husband, no needs for DME, good support system Seat Belt Safety/Bike Helmet: Wears seat belt.      Objective:    Today's Vitals   06/18/17 1338 06/18/17 1342  BP: 136/68   Pulse: 82   Resp: 18   SpO2: 100%   Weight: 206 lb (93.4 kg)   Height: 4\' 11"  (1.499 m)   PainSc:  2    Body mass index is 41.61 kg/m.  Advanced Directives 06/18/2017 11/10/2015 10/27/2015 09/12/2014 04/24/2014 02/02/2014  Does Patient Have a Medical Advance Directive? Yes Yes Yes No Yes Yes  Type of Paramedic of [place];Living will [place];Living will [place];Living will - - -  Copy of [place] in Chart? No - copy requested - - - - -    Current Medications (verified) Outpatient Encounter Medications as of 06/18/2017  Medication Sig  . acetaminophen (TYLENOL) 500 MG tablet Take 500 mg by mouth every 6 (six) hours as needed.  . diclofenac sodium (VOLTAREN) 1 % GEL 4 GRAM(S) FOUR TIMES DAILY, AS NEEDED  . diphenhydrAMINE (BENADRYL) 25 MG tablet Take 1 tablet (25 mg total) by mouth every 6 (six) hours.  . hydrochlorothiazide (HYDRODIURIL) 25 MG tablet Take 1 tablet (25 mg total) by mouth daily.  Marland Kitchen levothyroxine (SYNTHROID, LEVOTHROID) 100 MCG tablet Take 1 tablet (100  mcg total) by mouth daily before breakfast.  . metoprolol succinate (TOPROL XL) 50 MG 24 hr tablet Take 1 tablet (50 mg total) by mouth daily.  . [DISCONTINUED] cetirizine (ZYRTEC) 10 MG tablet Take 10 mg by mouth daily.  . [DISCONTINUED] hydrocortisone 2.5 % cream APPLY BY TOPICAL ROUTE 2 TIMES EVERY DAY A THIN LAYER TO THE AFFECTED AREA(S)   No facility-administered encounter medications on file as of 06/18/2017.     Allergies (verified) Aspirin and Tape   History: Past Medical History:  Diagnosis Date  . Arthritis   . Cancer [facility]) 2004   rectal cancer  . Cataract    right eye  . Chiari malformation   . Depression   . Diabetes mellitus ([place])    "pre"  . Hypertension   . Sleep apnea    no CPAP  . Thyroid disease    hyporthyroid   Past Surgical History:  Procedure Laterality Date  . CATARACT EXTRACTION Bilateral   . chiari malformation  2013   surgery  . COLONOSCOPY    . FLEXIBLE SIGMOIDOSCOPY    . POLYPECTOMY     Family History  Problem Relation Age of Onset  . Alzheimer's disease Mother   . Stroke Mother        mini  . Heart attack Mother   . Hypertension Mother   . Depression Unknown   . Anemia Unknown   . Obesity Unknown   . Colon cancer Neg Hx   .  Esophageal cancer Neg Hx   . Stomach cancer Neg Hx    Social History   Socioeconomic History  . Marital status: Married    Spouse name: Eddie Dibbles  . Number of children: 2  . Years of education: Masters  . Highest education level: Not on file  Occupational History    Comment: retired  Scientific laboratory technician  . Financial resource strain: Not hard at all  . Food insecurity:    Worry: Never true    Inability: Never true  . Transportation needs:    Medical: No    Non-medical: No  Tobacco Use  . Smoking status: Former Smoker    Packs/day: 0.25  . Smokeless tobacco: Never Used  . Tobacco comment: 04/2013,quit  Substance and Sexual Activity  . Alcohol use: No    Alcohol/week: 0.0 oz  . Drug use: No  . Sexual  activity: Not Currently  Lifestyle  . Physical activity:    Days per week: 0 days    Minutes per session: 0 min  . Stress: To some extent  Relationships  . Social connections:    Talks on phone: More than three times a week    Gets together: More than three times a week    Attends religious service: More than 4 times per year    Active member of club or organization: Yes    Attends meetings of clubs or organizations: More than 4 times per year    Relationship status: Married  Other Topics Concern  . Not on file  Social History Narrative   Patient consumes one cup of coffee daily    Tobacco Counseling Counseling given: Not Answered Comment: 04/2013,quit  Activities of Daily Living In your present state of health, do you have any difficulty performing the following activities: 06/18/2017  Hearing? N  Vision? N  Difficulty concentrating or making decisions? N  Walking or climbing stairs? N  Dressing or bathing? N  Doing errands, shopping? N  Preparing Food and eating ? N  Using the Toilet? N  In the past six months, have you accidently leaked urine? N  Do you have problems with loss of bowel control? N  Managing your Medications? N  Managing your Finances? N  Housekeeping or managing your Housekeeping? N  Some recent data might be hidden     Immunizations and Health Maintenance Immunization History  Administered Date(s) Administered  . Influenza, High Dose Seasonal PF 12/26/2016  . Influenza-Unspecified 10/30/2013  . Pneumococcal Conjugate-13 03/06/2016   Health Maintenance Due  Topic Date Due  . OPHTHALMOLOGY EXAM  12/03/1944  . URINE MICROALBUMIN  12/03/1946  . TETANUS/TDAP  12/03/1955  . DEXA SCAN  12/02/2001  . PNA vac Low Risk Adult (2 of 2 - PPSV23) 03/06/2017  . FOOT EXAM  06/15/2017    Patient Care Team: Hoyt Koch, MD as PCP - General (Internal Medicine)  Indicate any recent Medical Services you may have received from other than [facility]  providers in the past year (date may be approximate).     Assessment:   This is a routine wellness examination for [place]. Physical assessment deferred to PCP.   Hearing/Vision screen Hearing Screening Comments: HOH, has hearing aids Vision Screening Comments: appointment yearly Dr. Katy Fitch  Dietary issues and exercise activities discussed: Current Exercise Habits: Structured exercise class(chair exercise pamphlets provided), Type of exercise: treadmill, Time (Minutes): 40, Frequency (Times/Week): 2, Weekly Exercise (Minutes/Week): 80, Intensity: Mild, Exercise limited by: orthopedic condition(s)  Diet (meal preparation, eat out, water  intake, caffeinated beverages, dairy products, fruits and vegetables): in general, a "healthy" diet  , well balanced   Reviewed heart healthy and diabetic diet, encouraged patient to increase daily water intake. Relevant patient education assigned to patient using Emmi. Diet education was attached to patient's AVS.  Goals    . Patient Stated     I want to get more informed about diabetes and I will listen to the emmi video that you send me. Monitor how much sugar and carbohydrates I eat.      Depression Screen PHQ 2/9 Scores 06/18/2017 04/16/2014  PHQ - 2 Score 1 1  PHQ- 9 Score 4 -    Fall Risk Fall Risk  06/18/2017 04/16/2014  Falls in the past year? No No   Cognitive Function: MMSE - Mini Mental State Exam 06/18/2017  Orientation to time 5  Orientation to Place 5  Registration 3  Attention/ Calculation 4  Recall 3  Language- name 2 objects 2  Language- repeat 1  Language- follow 3 step command 3  Language- read & follow direction 1  Write a sentence 1  Copy design 1  Total score 29        Screening Tests Health Maintenance  Topic Date Due  . OPHTHALMOLOGY EXAM  12[birth year]03/1946  . URINE MICROALBUMIN  12/03/1946  . TETANUS/TDAP  12/03/1955  . DEXA SCAN  12/02/2001  . PNA vac Low Risk Adult (2 of 2 - PPSV23) 03/06/2017  . FOOT EXAM   06/15/2017  . INFLUENZA VACCINE  08/30/2017  . COLONOSCOPY  11/10/2018     Plan:     I have personally reviewed and noted the following in the patient's chart:   . Medical and social history . Use of alcohol, tobacco or illicit drugs  . Current medications and supplements . Functional ability and status . Nutritional status . Physical activity . Advanced directives . List of other physicians . Vitals . Screenings to include cognitive, depression, and falls . Referrals and appointments  In addition, I have reviewed and discussed with patient certain preventive protocols, quality metrics, and best practice recommendations. A written personalized care plan for preventive services as well as general preventive health recommendations were provided to patient.     Michiel Cowboy, RN   06/18/2017

## 2017-06-18 NOTE — Patient Instructions (Addendum)
Continue doing brain stimulating activities (puzzles, reading, adult coloring books, staying active) to keep memory sharp.   Continue to eat heart healthy diet (full of fruits, vegetables, whole grains, lean protein, water--limit salt, fat, and sugar intake) and increase physical activity as tolerated.   [name] , Thank you for taking time to come for your Medicare Wellness Visit. I appreciate your ongoing commitment to your health goals. Please review the following plan we discussed and let me know if I can assist you in the future.   These are the goals we discussed: Goals    . Patient Stated     I want to get more informed about diabetes and I will listen to the emmi video that you send me. Monitor how much sugar and carbohydrates I eat.       This is a list of the screening recommended for you and due dates:  Health Maintenance  Topic Date Due  . Eye exam for diabetics  12/03/1946  . Urine Protein Check  12/03/1946  . Tetanus Vaccine  12/03/1955  . DEXA scan (bone density measurement)  12/02/2001  . Pneumonia vaccines (2 of 2 - PPSV23) 03/06/2017  . Complete foot exam   06/15/2017  . Flu Shot  08/30/2017  . Colon Cancer Screening  11/10/2018    Fat and Cholesterol Restricted Diet Getting too much fat and cholesterol in your diet may cause health problems. Following this diet helps keep your fat and cholesterol at normal levels. This can keep you from getting sick. What types of fat should I choose?  Choose monosaturated and polyunsaturated fats. These are found in foods such as olive oil, canola oil, flaxseeds, walnuts, almonds, and seeds.  Eat more omega-3 fats. Good choices include salmon, mackerel, sardines, tuna, flaxseed oil, and ground flaxseeds.  Limit saturated fats. These are in animal products such as meats, butter, and cream. They can also be in plant products such as palm oil, palm kernel oil, and coconut oil.  Avoid foods with partially hydrogenated oils in  them. These contain trans fats. Examples of foods that have trans fats are stick margarine, some tub margarines, cookies, crackers, and other baked goods. What general guidelines do I need to follow?  Check food labels. Look for the words "trans fat" and "saturated fat."  When preparing a meal: ? Fill half of your plate with vegetables and green salads. ? Fill one fourth of your plate with whole grains. Look for the word "whole" as the first word in the ingredient list. ? Fill one fourth of your plate with lean protein foods.  Eat more foods that have fiber, like apples, carrots, beans, peas, and barley.  Eat more home-cooked foods. Eat less at restaurants and buffets.  Limit or avoid alcohol.  Limit foods high in starch and sugar.  Limit fried foods.  Cook foods without frying them. Baking, boiling, grilling, and broiling are all great options.  Lose weight if you are overweight. Losing even a small amount of weight can help your overall health. It can also help prevent diseases such as diabetes and heart disease. What foods can I eat? Grains Whole grains, such as whole wheat or whole grain breads, crackers, cereals, and pasta. Unsweetened oatmeal, bulgur, barley, quinoa, or brown rice. Corn or whole wheat flour tortillas. Vegetables Fresh or frozen vegetables (raw, steamed, roasted, or grilled). Green salads. Fruits All fresh, canned (in natural juice), or frozen fruits. Meat and Other Protein Products Ground beef (85% or leaner), grass-fed  beef, or beef trimmed of fat. Skinless chicken or [place]. Ground chicken or [place]. Pork trimmed of fat. All fish and seafood. Eggs. Dried beans, peas, or lentils. Unsalted nuts or seeds. Unsalted canned or dry beans. Dairy Low-fat dairy products, such as skim or 1% milk, 2% or reduced-fat cheeses, low-fat ricotta or cottage cheese, or plain low-fat yogurt. Fats and Oils Tub margarines without trans fats. Light or reduced-fat mayonnaise and  salad dressings. Avocado. Olive, canola, sesame, or safflower oils. Natural peanut or almond butter (choose ones without added sugar and oil). The items listed above may not be a complete list of recommended foods or beverages. Contact your dietitian for more options. What foods are not recommended? Grains White bread. White pasta. White rice. Cornbread. Bagels, pastries, and croissants. Crackers that contain trans fat. Vegetables White potatoes. Corn. Creamed or fried vegetables. Vegetables in a cheese sauce. Fruits Dried fruits. Canned fruit in light or heavy syrup. Fruit juice. Meat and Other Protein Products Fatty cuts of meat. Ribs, chicken wings, bacon, sausage, bologna, salami, chitterlings, fatback, hot dogs, bratwurst, and packaged luncheon meats. Liver and organ meats. Dairy Whole or 2% milk, cream, half-and-half, and cream cheese. Whole milk cheeses. Whole-fat or sweetened yogurt. Full-fat cheeses. Nondairy creamers and whipped toppings. Processed cheese, cheese spreads, or cheese curds. Sweets and Desserts Corn syrup, sugars, honey, and molasses. Candy. Jam and jelly. Syrup. Sweetened cereals. Cookies, pies, cakes, donuts, muffins, and ice cream. Fats and Oils Butter, stick margarine, lard, shortening, ghee, or bacon fat. Coconut, palm kernel, or palm oils. Beverages Alcohol. Sweetened drinks (such as sodas, lemonade, and fruit drinks or punches). The items listed above may not be a complete list of foods and beverages to avoid. Contact your dietitian for more information. This information is not intended to replace advice given to you by your health care provider. Make sure you discuss any questions you have with your health care provider. Document Released: 07/18/2011 Document Revised: 09/23/2015 Document Reviewed: 04/17/2013 Elsevier Interactive Patient Education  2018 [employer].  Carbohydrate Counting for Diabetes Mellitus, Adult Carbohydrate counting is a method for  keeping track of how many carbohydrates you eat. Eating carbohydrates naturally increases the amount of sugar (glucose) in the blood. Counting how many carbohydrates you eat helps keep your blood glucose within normal limits, which helps you manage your diabetes (diabetes mellitus). It is important to know how many carbohydrates you can safely have in each meal. This is different for every person. A diet and nutrition specialist (registered dietitian) can help you make a meal plan and calculate how many carbohydrates you should have at each meal and snack. Carbohydrates are found in the following foods:  Grains, such as breads and cereals.  Dried beans and soy products.  Starchy vegetables, such as potatoes, peas, and corn.  Fruit and fruit juices.  Milk and yogurt.  Sweets and snack foods, such as cake, cookies, candy, chips, and soft drinks.  How do I count carbohydrates? There are two ways to count carbohydrates in food. You can use either of the methods or a combination of both. Reading "Nutrition Facts" on packaged food The "Nutrition Facts" list is included on the labels of almost all packaged foods and beverages in the [place] It includes:  The serving size.  Information about nutrients in each serving, including the grams (g) of carbohydrate per serving.  To use the "Nutrition Facts":  Decide how many servings you will have.  Multiply the number of servings by the number of carbohydrates  per serving.  The resulting number is the total amount of carbohydrates that you will be having.  Learning standard serving sizes of other foods When you eat foods containing carbohydrates that are not packaged or do not include "Nutrition Facts" on the label, you need to measure the servings in order to count the amount of carbohydrates:  Measure the foods that you will eat with a food scale or measuring cup, if needed.  Decide how many standard-size servings you will eat.  Multiply the  number of servings by 15. Most carbohydrate-rich foods have about 15 g of carbohydrates per serving. ? For example, if you eat 8 oz (170 g) of strawberries, you will have eaten 2 servings and 30 g of carbohydrates (2 servings x 15 g = 30 g).  For foods that have more than one food mixed, such as soups and casseroles, you must count the carbohydrates in each food that is included.  The following list contains standard serving sizes of common carbohydrate-rich foods. Each of these servings has about 15 g of carbohydrates:   hamburger bun or  English muffin.   oz (15 mL) syrup.   oz (14 g) jelly.  1 slice of bread.  1 six-inch tortilla.  3 oz (85 g) cooked rice or pasta.  4 oz (113 g) cooked dried beans.  4 oz (113 g) starchy vegetable, such as peas, corn, or potatoes.  4 oz (113 g) hot cereal.  4 oz (113 g) mashed potatoes or  of a large baked potato.  4 oz (113 g) canned or frozen fruit.  4 oz (120 mL) fruit juice.  4-6 crackers.  6 chicken nuggets.  6 oz (170 g) unsweetened dry cereal.  6 oz (170 g) plain fat-free yogurt or yogurt sweetened with artificial sweeteners.  8 oz (240 mL) milk.  8 oz (170 g) fresh fruit or one small piece of fruit.  24 oz (680 g) popped popcorn.  Example of carbohydrate counting Sample meal  3 oz (85 g) chicken breast.  6 oz (170 g) brown rice.  4 oz (113 g) corn.  8 oz (240 mL) milk.  8 oz (170 g) strawberries with sugar-free whipped topping. Carbohydrate calculation 1. Identify the foods that contain carbohydrates: ? Rice. ? Corn. ? Milk. ? Strawberries. 2. Calculate how many servings you have of each food: ? 2 servings rice. ? 1 serving corn. ? 1 serving milk. ? 1 serving strawberries. 3. Multiply each number of servings by 15 g: ? 2 servings rice x 15 g = 30 g. ? 1 serving corn x 15 g = 15 g. ? 1 serving milk x 15 g = 15 g. ? 1 serving strawberries x 15 g = 15 g. 4. Add together all of the amounts to find  the total grams of carbohydrates eaten: ? 30 g + 15 g + 15 g + 15 g = 75 g of carbohydrates total. This information is not intended to replace advice given to you by your health care provider. Make sure you discuss any questions you have with your health care provider. Document Released: 01/16/2005 Document Revised: 08/06/2015 Document Reviewed: 06/30/2015 Elsevier Interactive Patient Education  [employer].   It is important to avoid accidents which may result in broken bones.  Here are a few ideas on how to make your home safer so you will be less likely to trip or fall.  1. Use nonskid mats or non slip strips in your shower or tub,  on your bathroom floor and around sinks.  If you know that you have spilled water, wipe it up! 2. In the bathroom, it is important to have properly installed grab bars on the walls or on the edge of the tub.  Towel racks are NOT strong enough for you to hold onto or to pull on for support. 3. Stairs and hallways should have enough light.  Add lamps or night lights if you need ore light. 4. It is good to have handrails on both sides of the stairs if possible.  Always fix broken handrails right away. 5. It is important to see the edges of steps.  Paint the edges of outdoor steps white so you can see them better.  Put colored tape on the edge of inside steps. 6. Throw-rugs are dangerous because they can slide.  Removing the rugs is the best idea, but if they must stay, add adhesive carpet tape to prevent slipping. 7. Do not keep things on stairs or in the halls.  Remove small furniture that blocks the halls as it may cause you to trip.  Keep telephone and electrical cords out of the way where you walk. 8. Always were sturdy, rubber-soled shoes for good support.  Never wear just socks, especially on the stairs.  Socks may cause you to slip or fall.  Do not wear full-length housecoats as you can easily trip on the bottom.  9. Place the things you use the most on the  shelves that are the easiest to reach.  If you use a stepstool, make sure it is in good condition.  If you feel unsteady, DO NOT climb, ask for help. 10. If a health professional advises you to use a cane or walker, do not be ashamed.  These items can keep you from falling and breaking your bones.

## 2017-06-19 NOTE — Progress Notes (Signed)
Medical screening examination/treatment/procedure(s) were performed by non-physician practitioner and as supervising physician I was immediately available for consultation/collaboration. I agree with above. Elizabeth A Crawford, MD 

## 2017-06-29 IMAGING — MG 2D DIGITAL SCREENING BILATERAL MAMMOGRAM WITH CAD AND ADJUNCT TO
8 of 12 series · 8 of 28 positions shown · non-contrast
Comparison: Previous exam(s).

ACR Breast Density Category a: The breast tissue is almost entirely
fatty.

CLINICAL DATA: Screening.

EXAM:
2D DIGITAL SCREENING BILATERAL MAMMOGRAM WITH CAD AND ADJUNCT TOMO

[R MLO synth-2D]
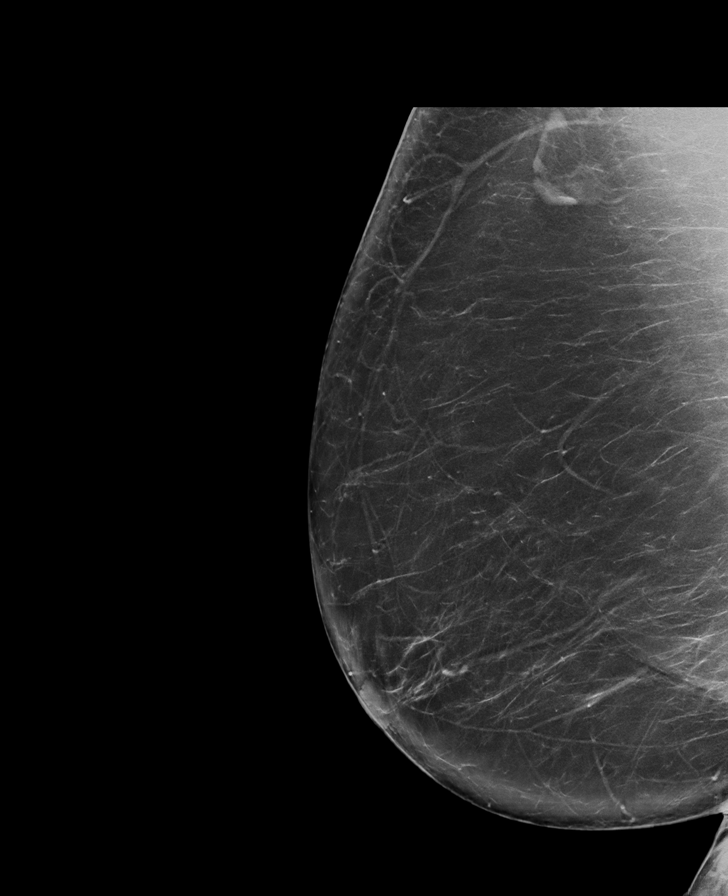

[L MLO synth-2D]
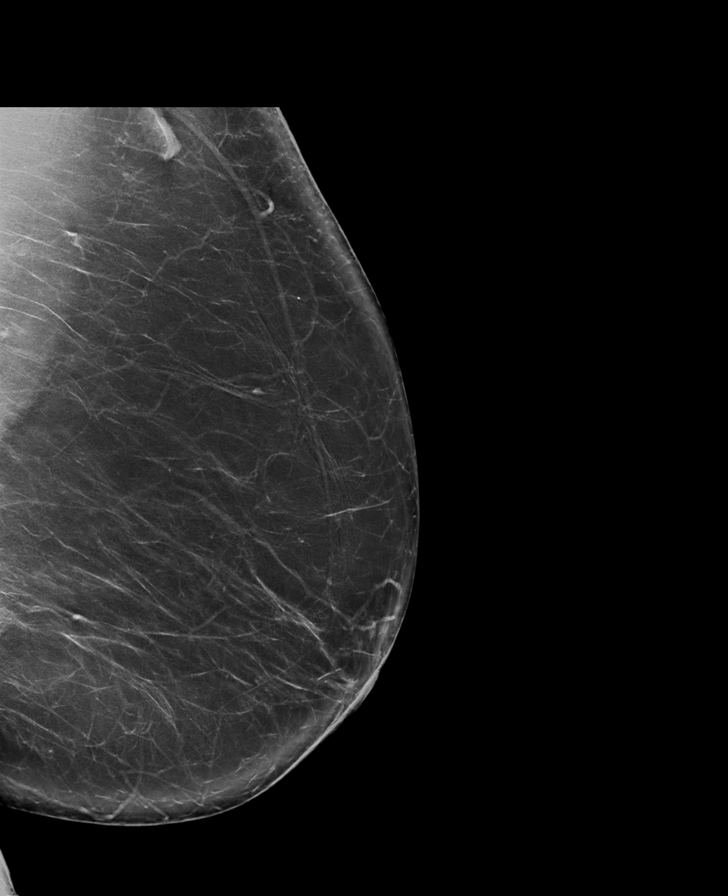

[L CC synth-2D]
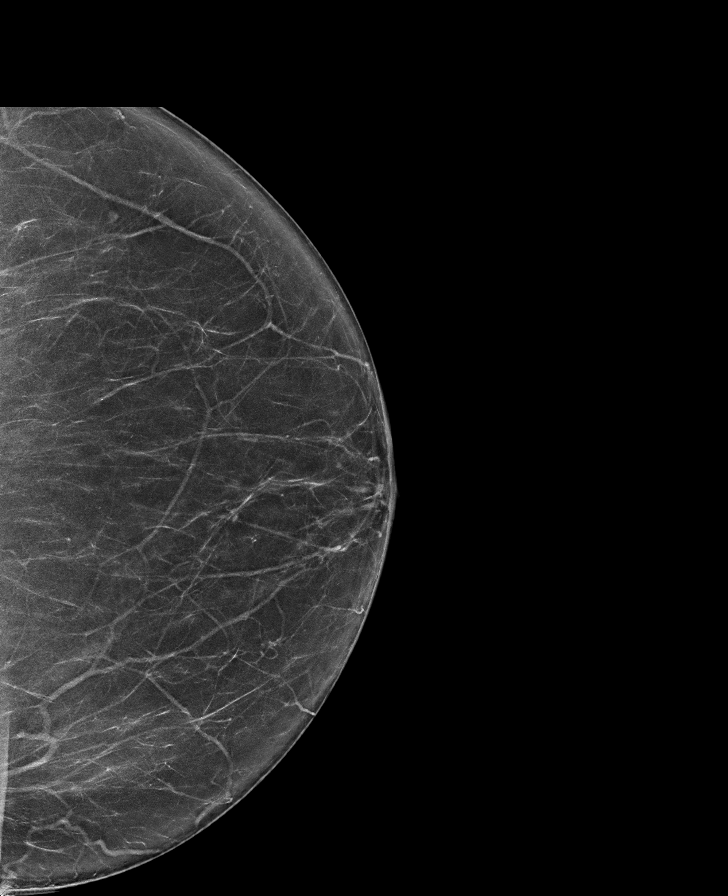

[R CC synth-2D]
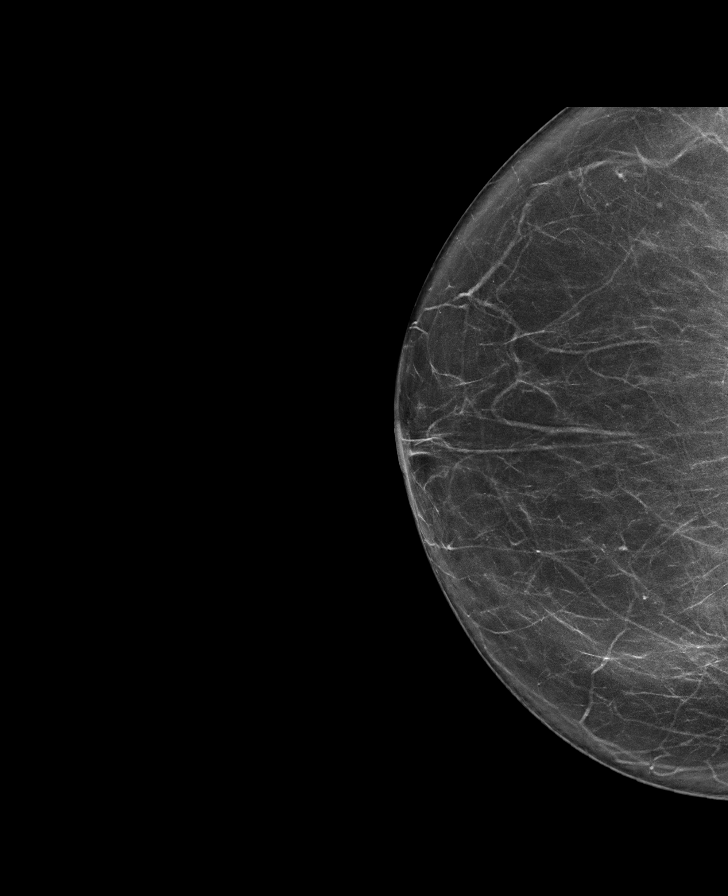

[R MLO]
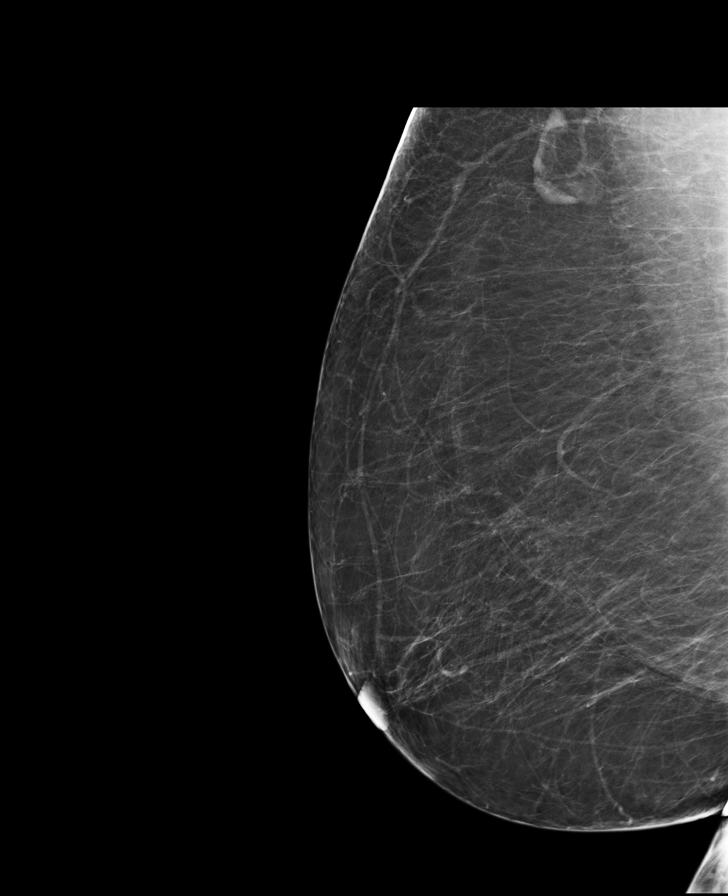

[L CC]
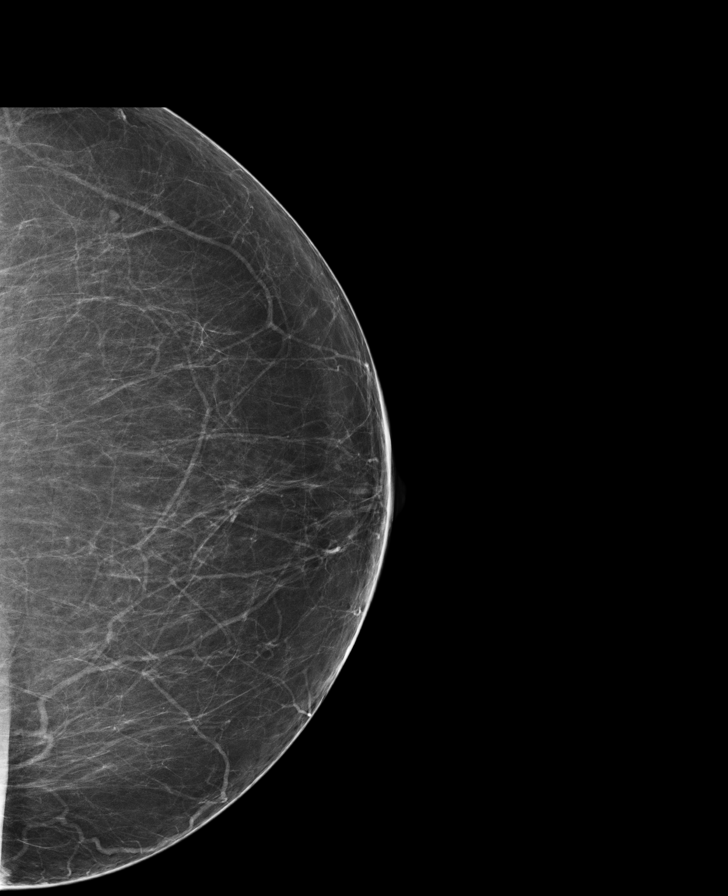

[R CC]
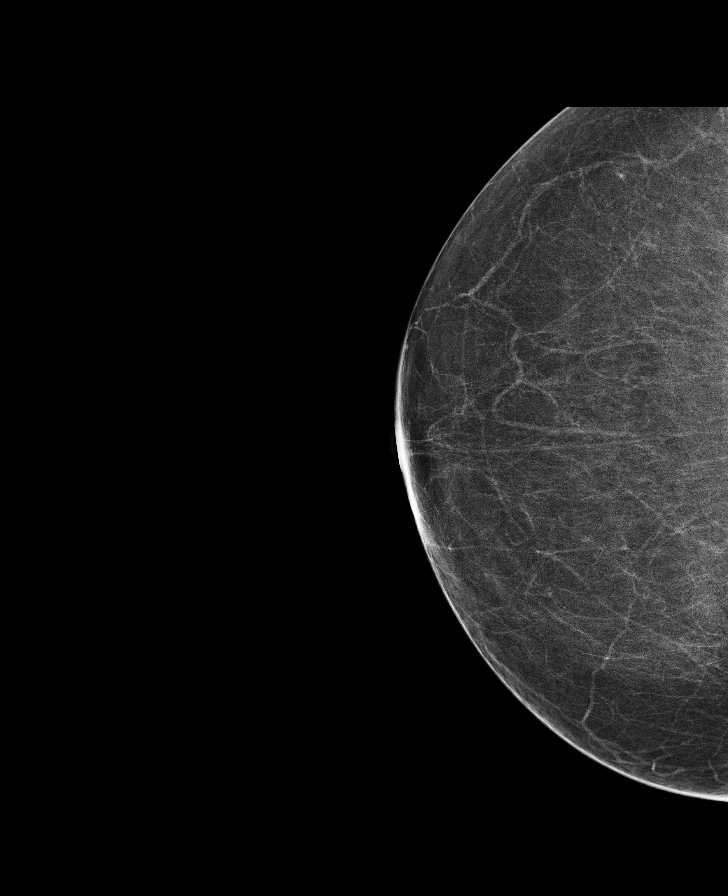

[L MLO]
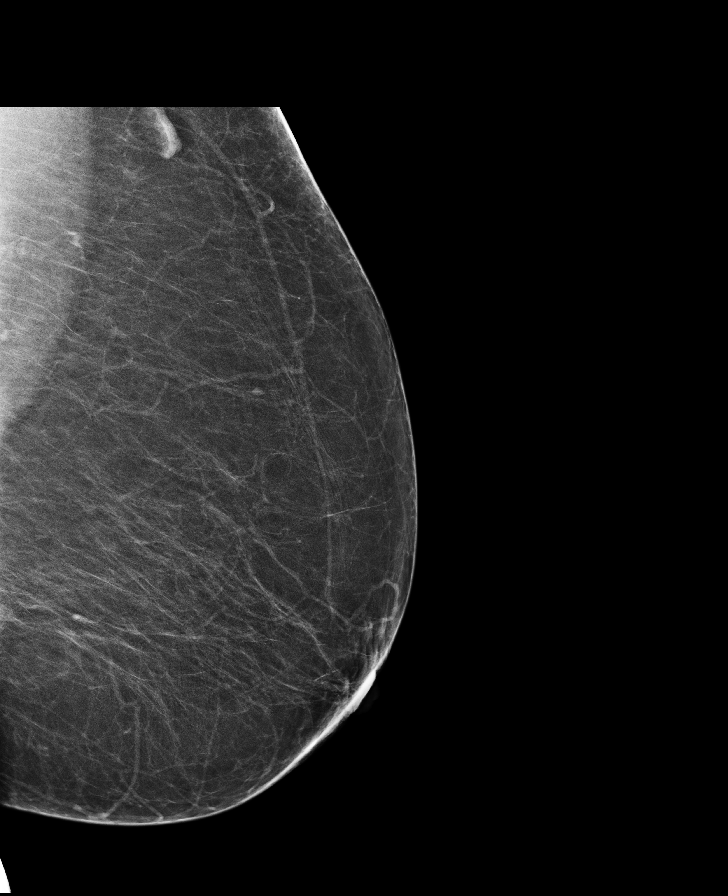

[8 of 28 positions shown; findings below may reference images not displayed]

FINDINGS: There are no findings suspicious for malignancy. Images were
processed with CAD.
IMPRESSION: No mammographic evidence of malignancy. A result letter of this
screening mammogram will be mailed directly to the patient.

RECOMMENDATION:
Screening mammogram in one year. (Code:1B-[id number])

BI-RADS CATEGORY  1: Negative.

## 2017-07-11 ENCOUNTER — Encounter: Payer: Self-pay | Admitting: Internal Medicine

## 2017-07-11 ENCOUNTER — Other Ambulatory Visit (INDEPENDENT_AMBULATORY_CARE_PROVIDER_SITE_OTHER): Payer: Medicare Other

## 2017-07-11 ENCOUNTER — Ambulatory Visit (INDEPENDENT_AMBULATORY_CARE_PROVIDER_SITE_OTHER): Payer: Medicare Other | Admitting: Internal Medicine

## 2017-07-11 VITALS — BP 116/64 | HR 65 | Temp 97.9°F | Ht 59.0 in | Wt 202.0 lb

## 2017-07-11 DIAGNOSIS — I10 Essential (primary) hypertension: Secondary | ICD-10-CM | POA: Diagnosis not present

## 2017-07-11 DIAGNOSIS — R7301 Impaired fasting glucose: Secondary | ICD-10-CM

## 2017-07-11 DIAGNOSIS — Z0000 Encounter for general adult medical examination without abnormal findings: Secondary | ICD-10-CM | POA: Diagnosis not present

## 2017-07-11 DIAGNOSIS — Z23 Encounter for immunization: Secondary | ICD-10-CM | POA: Diagnosis not present

## 2017-07-11 LAB — LIPID PANEL
CHOLESTEROL: 174 mg/dL (ref 0–200)
HDL: 46 mg/dL (ref >39.00)
LDL Cholesterol: 92 mg/dL (ref 0–99)
NonHDL: 127.7
TRIGLYCERIDES: 179 mg/dL — AB (ref 0.0–149.0)
Total CHOL/HDL Ratio: 4
VLDL: 35.8 mg/dL (ref 0.0–40.0)

## 2017-07-11 LAB — COMPREHENSIVE METABOLIC PANEL
ALBUMIN: 3.8 g/dL (ref 3.5–5.2)
ALT: 20 U/L (ref 0–35)
AST: 23 U/L (ref 0–37)
Alkaline Phosphatase: 86 U/L (ref 39–117)
BUN: 28 mg/dL — ABNORMAL HIGH (ref 6–23)
CALCIUM: 9.8 mg/dL (ref 8.4–10.5)
CO2: 28 meq/L (ref 19–32)
CREATININE: 1.7 mg/dL — AB (ref 0.40–1.20)
Chloride: 103 mEq/L (ref 96–112)
GFR: 37.13 mL/min — ABNORMAL LOW (ref >60.00)
Glucose, Bld: 101 mg/dL — ABNORMAL HIGH (ref 70–99)
Potassium: 3.9 mEq/L (ref 3.5–5.1)
Sodium: 137 mEq/L (ref 135–145)
Total Bilirubin: 0.3 mg/dL (ref 0.2–1.2)
Total Protein: 6.9 g/dL (ref 6.0–8.3)

## 2017-07-11 LAB — CBC
HCT: 37 % (ref 36.0–46.0)
Hemoglobin: 12 g/dL (ref 12.0–15.0)
MCHC: 32.6 g/dL (ref 30.0–36.0)
MCV: 85.9 fl (ref 78.0–100.0)
Platelets: 229 10*3/uL (ref 150.0–400.0)
RBC: 4.31 Mil/uL (ref 3.87–5.11)
RDW: 13.8 % (ref 11.5–15.5)
WBC: 4.4 10*3/uL (ref 4.0–10.5)

## 2017-07-11 LAB — HEMOGLOBIN A1C: HEMOGLOBIN A1C: 6.4 % (ref 4.6–6.5)

## 2017-07-11 NOTE — Patient Instructions (Signed)

## 2017-07-11 NOTE — Progress Notes (Signed)
   Subjective:    Patient ID: Lynn Ray, female    DOB: Jun 24, 1934, 83 y.o.   MRN: [id number]  HPI The patient is an 83 YO female coming in for physical.   PMH, [facility], social history reviewed and updated.   Review of Systems  Constitutional: Negative.   HENT: Negative.   Eyes: Negative.   Respiratory: Negative for cough, chest tightness and shortness of breath.   Cardiovascular: Negative for chest pain, palpitations and leg swelling.  Gastrointestinal: Negative for abdominal distention, abdominal pain, constipation, diarrhea, nausea and vomiting.  Musculoskeletal: Positive for arthralgias.  Skin: Negative.   Neurological: Negative.   Psychiatric/Behavioral: Negative.       Objective:   Physical Exam  Constitutional: She is oriented to person, place, and time. She appears well-developed and well-nourished.  HENT:  Head: Normocephalic and atraumatic.  Eyes: EOM are normal.  Neck: Normal range of motion.  Cardiovascular: Normal rate and regular rhythm.  Pulmonary/Chest: Effort normal and breath sounds normal. No respiratory distress. She has no wheezes. She has no rales.  Abdominal: Soft. Bowel sounds are normal. She exhibits no distension. There is no tenderness. There is no rebound.  Musculoskeletal: She exhibits no edema.  Neurological: She is alert and oriented to person, place, and time. Coordination normal.  Skin: Skin is warm and dry.  Psychiatric: She has a normal mood and affect.   Vitals:   07/11/17 1039  BP: 116/64  Pulse: 65  Temp: 97.9 F (36.6 C)  TempSrc: Oral  SpO2: 99%  Weight: 202 lb (91.6 kg)  Height: 4\' 11"  (1.499 m)      Assessment & Plan:  Tdap given at visit

## 2017-07-13 DIAGNOSIS — Z0000 Encounter for general adult medical examination without abnormal findings: Secondary | ICD-10-CM | POA: Insufficient documentation

## 2017-07-13 NOTE — Assessment & Plan Note (Signed)
Still working on weight loss through diet changes.

## 2017-07-13 NOTE — Assessment & Plan Note (Signed)
BP at goal on hctz and metoprolol. Checking CMP and adjust as needed.

## 2017-07-13 NOTE — Assessment & Plan Note (Addendum)
Flu shot yearly. Pneumonia done. Shingrix counseled. Tetanus given at visit. Colonoscopy due 2020. Mammogram not indicated, pap smear not indicated and dexa ordered. Counseled about sun safety and mole surveillance. Counseled about the dangers of distracted driving. Given 10 year screening recommendations.

## 2017-08-11 ENCOUNTER — Other Ambulatory Visit: Payer: Self-pay | Admitting: Internal Medicine

## 2017-09-05 ENCOUNTER — Other Ambulatory Visit: Payer: Self-pay | Admitting: Internal Medicine

## 2017-10-04 ENCOUNTER — Telehealth: Payer: Self-pay | Admitting: Internal Medicine

## 2017-10-04 ENCOUNTER — Ambulatory Visit: Payer: Self-pay | Admitting: *Deleted

## 2017-10-04 NOTE — Telephone Encounter (Signed)
Pt informed

## 2017-10-04 NOTE — Telephone Encounter (Signed)
We cannot call in any controlled substances per law without seeing patient. She can get sooner apt with anyone to help with pain relief since the pain is severe I would not recommend she wait for apt with myself on Monday. She can take tylenol as well up to 3000 mg total daily.

## 2017-10-04 NOTE — Telephone Encounter (Signed)
Copied from [place] [phone]. Topic: Quick Communication - Rx Refill/Question >> Oct 04, 2017 12:13 PM Yvette Rack wrote: Medication: HYDROcodone-acetaminophen (NORCO) 5-325 MG per tablet   DR Cammie Norvel Richards from [place] use to prescribe this medicine for pt she is having pain in her neck shoulder arm and hand and takes this medicine for that Dr Sharlet Salina never wrote her for this RX  Has the patient contacted their pharmacy? No. (Agent: If no, request that the patient contact the pharmacy for the refill.) (Agent: If yes, when and what did the pharmacy advise?)  Preferred Pharmacy (with phone number or street name):   [id number] - [place], [place] [phone] (Phone) [phone] (Fax)    Agent: Please be advised that RX refills may take up to 3 business days. We ask that you follow-up with your pharmacy.

## 2017-10-04 NOTE — Telephone Encounter (Signed)
Can you call and make patient an appointment with anyone or with [place] sooner if she wants any type of medication. Thank you

## 2017-10-04 NOTE — Telephone Encounter (Signed)
Pt called to request Hydrocodone be called in by Dr. Sharlet Salina for right hand, neck, arm, shoulder pain. Pt called and triaged.  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 States pain is 8-10/10 today. "Some days are worse than others." Has taken 2 aleve yesterday with minimal relief; was instructed to not take often due to decreased kidney function. Appt made with Dr. Sharlet Salina for Monday. Pt is requesting Norco be called in for her until appt.Marland Kitchen LRF 07/29/15 by Historical Provider. Please advise: [phone]  Reason for Disposition . [1] MODERATE pain (e.g., interferes with normal activities) AND [2] present > 3 days  Answer Assessment - Initial Assessment Questions 1. ONSET: "When did the pain start?"     "Years ago"  since surgery for Chiari Malformation 2. LOCATION: "Where is the pain located?"     Right sided; neck ,arm,hand, shoulder, at times down leg 3. PAIN: "How bad is the pain?" (Scale 1-10; or mild, moderate, severe)   - MILD (1-3): doesn't interfere with normal activities   - MODERATE (4-7): interferes with normal activities (e.g., work or school) or awakens from sleep   - SEVERE (8-10): excruciating pain, unable to do any normal activities, unable to hold a cup of water     *8/10-10/10 4. WORK OR EXERCISE: "Has there been any recent work or exercise that involved this part of the body?"     no 5. CAUSE: "What do you think is causing the arm pain?"     "Brain surgery" 6. OTHER SYMPTOMS: "Do you have any other symptoms?" (e.g., neck pain, swelling, rash, fever, numbness, weakness)     Mild tingling  Protocols used: ARM PAIN-A-AH

## 2017-10-08 ENCOUNTER — Encounter: Payer: Self-pay | Admitting: Internal Medicine

## 2017-10-08 ENCOUNTER — Ambulatory Visit (INDEPENDENT_AMBULATORY_CARE_PROVIDER_SITE_OTHER): Payer: Medicare Other | Admitting: Internal Medicine

## 2017-10-08 VITALS — BP 118/60 | HR 76 | Temp 98.2°F | Ht 59.0 in | Wt 199.0 lb

## 2017-10-08 DIAGNOSIS — Z8669 Personal history of other diseases of the nervous system and sense organs: Secondary | ICD-10-CM | POA: Diagnosis not present

## 2017-10-08 DIAGNOSIS — M79601 Pain in right arm: Secondary | ICD-10-CM

## 2017-10-08 DIAGNOSIS — Z23 Encounter for immunization: Secondary | ICD-10-CM

## 2017-10-08 MED ORDER — DICLOFENAC SODIUM 1 % TD GEL: TRANSDERMAL | 6 refills | Status: DC

## 2017-10-08 MED ORDER — HYDROCODONE-ACETAMINOPHEN 5-325 MG PO TABS: 0.5000 | ORAL_TABLET | Freq: Four times a day (QID) | ORAL | 0 refills | Status: AC | PRN

## 2017-10-08 NOTE — Patient Instructions (Addendum)
We have given you the flu shot today.  We have sent in the hydrocodone to take if needed 1/2 pill every 6 hours maximum. The law only allows [place] to send in a 5 day supply to start with so you can call back after that. If you need this all the time (everyday) we may need to get you in to sign a contract so that we can get you the medicine correctly.

## 2017-10-08 NOTE — Progress Notes (Signed)
   Subjective:    Patient ID: Lynn Ray, female    DOB: 12-May-1934, 83 y.o.   MRN: [id number]  HPI The patient is an 83 YO female coming in for right shoulder and arm pain. Started after her chiari surgery many years ago (2013 per out records). She has tried many things for this pain. Usually she is able to make do without medication but in the winter or rainy/cold weather she gets more pain. She started having pain last week. She has taken hydrocodone 5/325 1/2 pill if needed for pain in the past. Has old bottle with her (last filled 2017 for 30 pills). She uses them rarely and often just deals with the pain or uses otc medications.   Review of Systems  Constitutional: Positive for activity change. Negative for appetite change, chills, fatigue, fever and unexpected weight change.  Respiratory: Negative.   Cardiovascular: Negative.   Gastrointestinal: Negative.   Musculoskeletal: Positive for arthralgias, myalgias and neck pain. Negative for back pain, gait problem and joint swelling.  Skin: Negative.   Neurological: Negative.       Objective:   Physical Exam  Constitutional: She is oriented to person, place, and time. She appears well-developed and well-nourished.  HENT:  Head: Normocephalic and atraumatic.  Eyes: EOM are normal.  Neck: Normal range of motion.  Cardiovascular: Normal rate and regular rhythm.  Pulmonary/Chest: Effort normal and breath sounds normal. No respiratory distress. She has no wheezes. She has no rales.  Musculoskeletal: She exhibits tenderness. She exhibits no edema.  Pain in the right shoulder and right triceps/biceps region  Neurological: She is alert and oriented to person, place, and time. Coordination normal.  Skin: Skin is warm and dry.   Vitals:   10/08/17 1421  BP: 118/60  Pulse: 76  Temp: 98.2 F (36.8 C)  TempSrc: Oral  SpO2: 99%  Weight: 199 lb (90.3 kg)  Height: 4\' 11"  (1.499 m)      Assessment & Plan:  Flu shot given at visit

## 2017-10-09 DIAGNOSIS — M79601 Pain in right arm: Secondary | ICD-10-CM | POA: Insufficient documentation

## 2017-10-09 DIAGNOSIS — Z8669 Personal history of other diseases of the nervous system and sense organs: Secondary | ICD-10-CM | POA: Insufficient documentation

## 2017-10-09 DIAGNOSIS — G8929 Other chronic pain: Secondary | ICD-10-CM | POA: Insufficient documentation

## 2017-10-09 NOTE — Assessment & Plan Note (Signed)
Rx for hydrocodone 5/325 mg take 1/2 up to 4 times daily as needed for pain #10. Checked [place] narcotic database prior to medication prescription.

## 2017-10-09 NOTE — Assessment & Plan Note (Signed)
Still gets pain with this. Will give limited supply of hydrocodone for severe pain episodes.

## 2018-03-11 ENCOUNTER — Encounter: Payer: Self-pay | Admitting: Internal Medicine

## 2018-03-11 ENCOUNTER — Ambulatory Visit: Payer: Medicare Other | Admitting: Internal Medicine

## 2018-03-11 DIAGNOSIS — Z8669 Personal history of other diseases of the nervous system and sense organs: Secondary | ICD-10-CM | POA: Diagnosis not present

## 2018-03-11 MED ORDER — GABAPENTIN 300 MG PO CAPS: 300.0000 mg | ORAL_CAPSULE | Freq: Two times a day (BID) | ORAL | 3 refills | Status: DC | PRN

## 2018-03-11 NOTE — Progress Notes (Signed)
   Subjective:   Patient ID: Lynn Ray, female    DOB: 1934/08/08, 84 y.o.   MRN: [id number]  HPI The patient is an 84 YO female coming in for pain in head, right shoulder, neck and right arm. Started after her chiari malfromation surgery in 2013. Previously has tried vicodin which helped with pain. She tried this last fall and it was not helpful so she has not used that. The pain is more like a burning sensation and stabbing needles recently. She is taking aleve which helps some and can take the edge off. She does not need this daily. She has pain on cold days and when weather is changing more often. Denies numbness or weakness in her arm. Denies swelling in the right side or arm.   Review of Systems  Constitutional: Positive for activity change. Negative for appetite change, chills, fatigue, fever and unexpected weight change.  Respiratory: Negative.   Cardiovascular: Negative.   Gastrointestinal: Negative.   Musculoskeletal: Positive for arthralgias, myalgias and neck pain. Negative for back pain, gait problem, joint swelling and neck stiffness.  Skin: Negative.   Neurological: Negative.     Objective:  Physical Exam Constitutional:      Appearance: She is well-developed.  HENT:     Head: Normocephalic and atraumatic.  Neck:     Musculoskeletal: Normal range of motion.  Cardiovascular:     Rate and Rhythm: Normal rate and regular rhythm.  Pulmonary:     Effort: Pulmonary effort is normal. No respiratory distress.     Breath sounds: Normal breath sounds. No wheezing or rales.  Abdominal:     General: There is no distension.     Tenderness: There is no abdominal tenderness.  Skin:    General: Skin is warm and dry.  Neurological:     Mental Status: She is alert and oriented to person, place, and time.     Coordination: Coordination normal.     Vitals:   03/11/18 1036  BP: 132/62  Pulse: 70  Temp: 98.1 F (36.7 C)  TempSrc: Oral  SpO2: 99%  Weight: 201 lb (91.2 kg)    Height: 4\' 11"  (1.499 m)    Assessment & Plan:

## 2018-03-11 NOTE — Assessment & Plan Note (Signed)
Rx for gabapentin to use for pain as this sounds nerve related. If no help will refer to neurosurgery locally to help explore options.

## 2018-03-11 NOTE — Patient Instructions (Signed)
We have sent in gabapentin that you can take 1 pill up to twice a day.   If you are not feeling better let us know and we can get you in with the specialist.

## 2018-04-02 ENCOUNTER — Other Ambulatory Visit: Payer: Self-pay | Admitting: Internal Medicine

## 2018-06-28 NOTE — Progress Notes (Signed)
Subjective:   Lynn Ray is a 84 y.o. female who presents for Medicare Annual (Subsequent) preventive examination. I connected with patient by a telephone and verified that I am speaking with the correct person using two identifiers. Patient stated full name and DOB. Patient gave permission to continue with telephonic visit. Patient's location was at home and Nurse's location was at [place] office.   Review of Systems:  No ROS.  Medicare Wellness Virtual Visit.  Visual/audio telehealth visit, UTA vital signs.   See social history for additional risk factors.  Cardiac Risk Factors include: advanced age (>18men, >91 women);diabetes mellitus;dyslipidemia;hypertension;obesity (BMI >30kg/m2);sedentary lifestyle Sleep patterns: has restless sleep, has frequent nighttime awakenings, gets up 1-2 times nightly to void and sleeps 5-6 hours nightly. Patient reports insomnia issues, discussed recommended sleep tips. Relevant patient education assigned to patient using Emmi.  Home Safety/Smoke Alarms: Feels safe in home. Smoke alarms in place.  Living environment; residence and Firearm Safety: 2-story house. Lives with husband, no needs for DME, good support system Seat Belt Safety/Bike Helmet: Wears seat belt.     Objective:     Vitals: There were no vitals taken for this visit.  There is no height or weight on file to calculate BMI.  Advanced Directives 07/01/2018 06/18/2017 11/10/2015 10/27/2015 09/12/2014 04/24/2014 02/02/2014  Does Patient Have a Medical Advance Directive? Yes Yes Yes Yes No Yes Yes  Type of Paramedic of [place];Living will [place];Living will [place];Living will [place];Living will - - -  Copy of [place] in Chart? No - copy requested No - copy requested - - - - -    Tobacco Social History   Tobacco Use  Smoking Status Former Smoker  . Packs/day: 0.25  Smokeless  Tobacco Never Used  Tobacco Comment   04/2013,quit     Counseling given: Not Answered Comment: 04/2013,quit  Past Medical History:  Diagnosis Date  . Arthritis   . Cancer [facility]) 2004   rectal cancer  . Cataract    right eye  . Chiari malformation   . Depression   . Diabetes mellitus ([place])    "pre"  . Hypertension   . Sleep apnea    no CPAP  . Thyroid disease    hyporthyroid   Past Surgical History:  Procedure Laterality Date  . CATARACT EXTRACTION Bilateral   . chiari malformation  2013   surgery  . COLONOSCOPY    . FLEXIBLE SIGMOIDOSCOPY    . POLYPECTOMY     Family History  Problem Relation Age of Onset  . Alzheimer's disease Mother   . Stroke Mother        mini  . Heart attack Mother   . Hypertension Mother   . Depression Other   . Anemia Other   . Obesity Other   . Colon cancer Neg Hx   . Esophageal cancer Neg Hx   . Stomach cancer Neg Hx    Social History   Socioeconomic History  . Marital status: Married    Spouse name: Lynn Ray  . Number of children: 2  . Years of education: Masters  . Highest education level: Not on file  Occupational History  . Occupation: retired    Comment: retired  Scientific laboratory technician  . Financial resource strain: Not hard at all  . Food insecurity:    Worry: Never true    Inability: Never true  . Transportation needs:    Medical: No  Non-medical: No  Tobacco Use  . Smoking status: Former Smoker    Packs/day: 0.25  . Smokeless tobacco: Never Used  . Tobacco comment: 04/2013,quit  Substance and Sexual Activity  . Alcohol use: No    Alcohol/week: 0.0 standard drinks  . Drug use: No  . Sexual activity: Not Currently  Lifestyle  . Physical activity:    Days per week: 0 days    Minutes per session: 0 min  . Stress: To some extent  Relationships  . Social connections:    Talks on phone: More than three times a week    Gets together: More than three times a week    Attends religious service: More than 4 times per year     Active member of club or organization: Yes    Attends meetings of clubs or organizations: More than 4 times per year    Relationship status: Married  Other Topics Concern  . Not on file  Social History Narrative   Patient consumes one cup of coffee daily    Outpatient Encounter Medications as of 07/01/2018  Medication Sig  . Calcium Carbonate Antacid (CALCIUM CARBONATE PO) Take 1 tablet by mouth.  . cholecalciferol (VITAMIN D3) 25 MCG (1000 UT) tablet Take 1,000 Units by mouth daily.  . diclofenac sodium (VOLTAREN) 1 % GEL 4 GRAM(S) FOUR TIMES DAILY, AS NEEDED  . diphenhydrAMINE (BENADRYL) 25 MG tablet Take 1 tablet (25 mg total) by mouth every 6 (six) hours. (Patient taking differently: Take 25 mg by mouth as needed. )  . hydrochlorothiazide (HYDRODIURIL) 25 MG tablet TAKE 1 TABLET BY MOUTH EVERY DAY  . levothyroxine (SYNTHROID, LEVOTHROID) 100 MCG tablet Take 1 tablet (100 mcg total) by mouth daily.  . metoprolol succinate (TOPROL-XL) 50 MG 24 hr tablet TAKE 1 TABLET BY MOUTH EVERY DAY  . [DISCONTINUED] acetaminophen (TYLENOL) 500 MG tablet Take 500 mg by mouth every 6 (six) hours as needed.  . [DISCONTINUED] gabapentin (NEURONTIN) 300 MG capsule TAKE 1 CAPSULE (300 MG TOTAL) BY MOUTH 2 (TWO) TIMES DAILY AS NEEDED. (Patient not taking: Reported on 07/01/2018)   No facility-administered encounter medications on file as of 07/01/2018.     Activities of Daily Living In your present state of health, do you have any difficulty performing the following activities: 07/01/2018  Hearing? N  Vision? N  Difficulty concentrating or making decisions? N  Walking or climbing stairs? Y  Dressing or bathing? N  Doing errands, shopping? N  Preparing Food and eating ? N  Using the Toilet? N  In the past six months, have you accidently leaked urine? N  Do you have problems with loss of bowel control? N  Managing your Medications? N  Managing your Finances? N  Housekeeping or managing your Housekeeping? N   Some recent data might be hidden    Patient Care Team: Hoyt Koch, MD as PCP - General (Internal Medicine)    Assessment:   This is a routine wellness examination for Lynn Ray. Physical assessment deferred to PCP.  Exercise Activities and Dietary recommendations Current Exercise Habits: The patient does not participate in regular exercise at present(chair exercise print-outs provided), Exercise limited by: orthopedic condition(s) Diet (meal preparation, eat out, water intake, caffeinated beverages, dairy products, fruits and vegetables): in general, a "healthy" diet  , well balanced   Reviewed heart healthy and diabetic diet. Encouraged patient to increase daily water and healthy fluid intake. Relevant patient education assigned to patient using Emmi.  Goals    .  Patient Stated      Monitor how much sugar and carbohydrates I eat.       Fall Risk Fall Risk  07/01/2018 06/18/2017 04/16/2014  Falls in the past year? 0 No No  Number falls in past yr: 0 - -  Risk for fall due to : Impaired balance/gait;Impaired mobility - -  Follow up Falls prevention discussed - -    Depression Screen PHQ 2/9 Scores 07/01/2018 06/18/2017 04/16/2014  PHQ - 2 Score 1 1 1   PHQ- 9 Score 5 4 -     Cognitive Function MMSE - Mini Mental State Exam 06/18/2017  Orientation to time 5  Orientation to Place 5  Registration 3  Attention/ Calculation 4  Recall 3  Language- name 2 objects 2  Language- repeat 1  Language- follow 3 step command 3  Language- read & follow direction 1  Write a sentence 1  Copy design 1  Total score 29       Ad8 score reviewed for issues:  Issues making decisions: no  Less interest in hobbies / activities: no  Repeats questions, stories (family complaining): no  Trouble using ordinary gadgets (microwave, computer, phone):no  Forgets the month or year: no  Mismanaging finances: no  Remembering appts: no  Daily problems with thinking and/or memory: no Ad8  score is= 0  Immunization History  Administered Date(s) Administered  . Influenza, High Dose Seasonal PF 12/26/2016, 10/08/2017  . Influenza-Unspecified 10/30/2013  . Pneumococcal Conjugate-13 03/06/2016  . Pneumococcal Polysaccharide-23 06/18/2017  . Tdap 07/11/2017   Screening Tests Health Maintenance  Topic Date Due  . DEXA SCAN  12/02/2001  . INFLUENZA VACCINE  08/31/2018  . COLONOSCOPY  11/10/2018  . TETANUS/TDAP  07/12/2027  . PNA vac Low Risk Adult  Completed      Plan:     Scheduled an appointment on 07/02/18 with PCP to discuss patient's pain issues.   Reviewed health maintenance screenings with patient today and relevant education, vaccines, and/or referrals were provided.   Continue to eat heart healthy diet (full of fruits, vegetables, whole grains, lean protein, water--limit salt, fat, and sugar intake) and increase physical activity as tolerated.  Continue doing brain stimulating activities (puzzles, reading, adult coloring books, staying active) to keep memory sharp.   I have personally reviewed and noted the following in the patient's chart:   . Medical and social history . Use of alcohol, tobacco or illicit drugs  . Current medications and supplements . Functional ability and status . Nutritional status . Physical activity . Advanced directives . List of other physicians . Screenings to include cognitive, depression, and falls . Referrals and appointments  In addition, I have reviewed and discussed with patient certain preventive protocols, quality metrics, and best practice recommendations. A written personalized care plan for preventive services as well as general preventive health recommendations were provided to patient.     Michiel Cowboy, RN  07/01/2018

## 2018-07-01 ENCOUNTER — Ambulatory Visit (INDEPENDENT_AMBULATORY_CARE_PROVIDER_SITE_OTHER): Payer: Medicare Other | Admitting: *Deleted

## 2018-07-01 DIAGNOSIS — Z0000 Encounter for general adult medical examination without abnormal findings: Secondary | ICD-10-CM | POA: Diagnosis not present

## 2018-07-01 NOTE — Progress Notes (Signed)
Medical screening examination/treatment/procedure(s) were performed by non-physician practitioner and as supervising physician I was immediately available for consultation/collaboration. I agree with above. Colson Barco A Jayveon Convey, MD 

## 2018-07-01 NOTE — Patient Instructions (Addendum)
Continue doing brain stimulating activities (puzzles, reading, adult coloring books, staying active) to keep memory sharp.   Continue to eat heart healthy diet (full of fruits, vegetables, whole grains, lean protein, water--limit salt, fat, and sugar intake) and increase physical activity as tolerated.  If you cannot attend class in person, you can still exercise at home. Video taped versions of AHOY classes are shown on [employer] (GTN) at 8 am and 1 pm Mondays through Fridays. You can also purchase a copy of the AHOY DVD by calling [place] (GTN) [employer]. GTN is available on [employer] channel 13 with a digital cable box and on [employer] channel 31. GTN is also available on [employer], channel 99. To view GTN, go to channel 99, press OK, select [place], then select GTN to start the channel.   Lynn Ray , Thank you for taking time to come for your Medicare Wellness Visit. I appreciate your ongoing commitment to your health goals. Please review the following plan we discussed and let me know if I can assist you in the future.   These are the goals we discussed: Goals    . Patient Stated     Monitor my diet for sugar and carbohydrates, increase my physical activity by doing chair exercises and participate with the AHOY exercise for seniors TV program.        This is a list of the screening recommended for you and due dates:  Health Maintenance  Topic Date Due  . DEXA scan (bone density measurement)  12/02/2001  . Flu Shot  08/31/2018  . Colon Cancer Screening  11/10/2018  . Tetanus Vaccine  07/12/2027  . Pneumonia vaccines  Completed    Preventive Care 68 Years and Older, Female Preventive care refers to lifestyle choices and visits with your health care provider that can promote health and wellness. What does preventive care include?  A yearly physical exam. This is also called an annual well check.  Dental exams once or  twice a year.  Routine eye exams. Ask your health care provider how often you should have your eyes checked.  Personal lifestyle choices, including: ? Daily care of your teeth and gums. ? Regular physical activity. ? Eating a healthy diet. ? Avoiding tobacco and drug use. ? Limiting alcohol use. ? Practicing safe sex. ? Taking low-dose aspirin every day. ? Taking vitamin and mineral supplements as recommended by your health care provider. What happens during an annual well check? The services and screenings done by your health care provider during your annual well check will depend on your age, overall health, lifestyle risk factors, and family history of disease. Counseling Your health care provider may ask you questions about your:  Alcohol use.  Tobacco use.  Drug use.  Emotional well-being.  Home and relationship well-being.  Sexual activity.  Eating habits.  History of falls.  Memory and ability to understand (cognition).  Work and work Statistician.  Reproductive health.  Screening You may have the following tests or measurements:  Height, weight, and BMI.  Blood pressure.  Lipid and cholesterol levels. These may be checked every 5 years, or more frequently if you are over 66 years old.  Skin check.  Lung cancer screening. You may have this screening every year starting at age 56 if you have a 30-pack-year history of smoking and currently smoke or have quit within the past 15 years.  Colorectal cancer screening. All adults should have this screening starting at  age 70 and continuing until age 58. You will have tests every 1-10 years, depending on your results and the type of screening test. People at increased risk should start screening at an earlier age. Screening tests may include: ? Guaiac-based fecal occult blood testing. ? Fecal immunochemical test (FIT). ? Stool DNA test. ? Virtual colonoscopy. ? Sigmoidoscopy. During this test, a flexible tube  with a tiny camera (sigmoidoscope) is used to examine your rectum and lower colon. The sigmoidoscope is inserted through your anus into your rectum and lower colon. ? Colonoscopy. During this test, a long, thin, flexible tube with a tiny camera (colonoscope) is used to examine your entire colon and rectum.  Hepatitis C blood test.  Hepatitis B blood test.  Sexually transmitted disease (STD) testing.  Diabetes screening. This is done by checking your blood sugar (glucose) after you have not eaten for a while (fasting). You may have this done every 1-3 years.  Bone density scan. This is done to screen for osteoporosis. You may have this done starting at age 33.  Mammogram. This may be done every 1-2 years. Talk to your health care provider about how often you should have regular mammograms. Talk with your health care provider about your test results, treatment options, and if necessary, the need for more tests. Vaccines Your health care provider may recommend certain vaccines, such as:  Influenza vaccine. This is recommended every year.  Tetanus, diphtheria, and acellular pertussis (Tdap, Td) vaccine. You may need a Td booster every 10 years.  Varicella vaccine. You may need this if you have not been vaccinated.  Zoster vaccine. You may need this after age 55.  Measles, mumps, and rubella (MMR) vaccine. You may need at least one dose of MMR if you were born in 1957 or later. You may also need a second dose.  Pneumococcal 13-valent conjugate (PCV13) vaccine. One dose is recommended after age 91.  Pneumococcal polysaccharide (PPSV23) vaccine. One dose is recommended after age 61.  Meningococcal vaccine. You may need this if you have certain conditions.  Hepatitis A vaccine. You may need this if you have certain conditions or if you travel or work in places where you may be exposed to hepatitis A.  Hepatitis B vaccine. You may need this if you have certain conditions or if you travel  or work in places where you may be exposed to hepatitis B.  Haemophilus influenzae type b (Hib) vaccine. You may need this if you have certain conditions. Talk to your health care provider about which screenings and vaccines you need and how often you need them. This information is not intended to replace advice given to you by your health care provider. Make sure you discuss any questions you have with your health care provider. Document Released: 02/12/2015 Document Revised: 03/08/2017 Document Reviewed: 11/17/2014 Elsevier Interactive Patient Education  2019 [employer].

## 2018-07-02 ENCOUNTER — Encounter: Payer: Self-pay | Admitting: Internal Medicine

## 2018-07-02 ENCOUNTER — Ambulatory Visit (INDEPENDENT_AMBULATORY_CARE_PROVIDER_SITE_OTHER): Payer: Medicare Other | Admitting: Internal Medicine

## 2018-07-02 DIAGNOSIS — M542 Cervicalgia: Secondary | ICD-10-CM

## 2018-07-02 DIAGNOSIS — M79601 Pain in right arm: Secondary | ICD-10-CM | POA: Diagnosis not present

## 2018-07-02 DIAGNOSIS — Z8669 Personal history of other diseases of the nervous system and sense organs: Secondary | ICD-10-CM

## 2018-07-02 NOTE — Progress Notes (Signed)
Virtual Visit via Video Note  I connected with Lynn Ray on 07/02/18 at  1:20 PM EDT by a video enabled telemedicine application and verified that I am speaking with the correct person using two identifiers.  The patient and the provider were at separate locations throughout the entire encounter.   I discussed the limitations of evaluation and management by telemedicine and the availability of in person appointments. The patient expressed understanding and agreed to proceed.  History of Present Illness: The patient is a 84 y.o. female with visit for right arm and neck pain. Started years ago and has been progressive over the years. Had chiari malformation fixed in 2013 and the pain has been since that time. Overall worsening in the last 6 months. She is starting to have limited function of the right arm which is her dominant hand. Has some weakness and numbness in the right arm and pain is burning kind of pain. Denies injury or overuse recently. She denies abrupt change in symptoms. Overall it is worsening. Has tried voltaren gel and tylenol which do not help much. We prescribed gabapentin and this was too strong for her and she felt hung over the next day and did not take again.   Observations/Objective: Appearance: normal, breathing appears normal, casual grooming, abdomen does not appear distended, throat normal, mental status is A and O times 3  Assessment and Plan: See problem oriented charting  Follow Up Instructions: referral to neurosurgery to try injections for pain relief  I discussed the assessment and treatment plan with the patient. The patient was provided an opportunity to ask questions and all were answered. The patient agreed with the plan and demonstrated an understanding of the instructions.   The patient was advised to call back or seek an in-person evaluation if the symptoms worsen or if the condition fails to improve as anticipated.  Hoyt Koch, MD

## 2018-07-03 NOTE — Assessment & Plan Note (Signed)
Referral to neurosurgery as she had adverse effect with the gabapentin and would like to try a more direct approach to the problem with injection if possible.

## 2018-09-12 ENCOUNTER — Other Ambulatory Visit: Payer: Self-pay | Admitting: Internal Medicine

## 2018-10-07 ENCOUNTER — Encounter: Payer: Self-pay | Admitting: Gastroenterology

## 2018-11-04 ENCOUNTER — Encounter: Payer: Self-pay | Admitting: Internal Medicine

## 2018-11-29 ENCOUNTER — Other Ambulatory Visit: Payer: Self-pay | Admitting: Neurological Surgery

## 2018-11-29 DIAGNOSIS — G935 Compression of brain: Secondary | ICD-10-CM

## 2018-12-06 ENCOUNTER — Ambulatory Visit (INDEPENDENT_AMBULATORY_CARE_PROVIDER_SITE_OTHER): Payer: Medicare Other | Admitting: Gastroenterology

## 2018-12-06 ENCOUNTER — Encounter: Payer: Self-pay | Admitting: Gastroenterology

## 2018-12-06 ENCOUNTER — Other Ambulatory Visit: Payer: Self-pay

## 2018-12-06 VITALS — BP 138/72 | HR 88 | Temp 96.7°F | Ht 59.5 in | Wt 216.5 lb

## 2018-12-06 DIAGNOSIS — Z86010 Personal history of colonic polyps: Secondary | ICD-10-CM | POA: Diagnosis not present

## 2018-12-06 DIAGNOSIS — R140 Abdominal distension (gaseous): Secondary | ICD-10-CM

## 2018-12-06 DIAGNOSIS — R197 Diarrhea, unspecified: Secondary | ICD-10-CM | POA: Diagnosis not present

## 2018-12-06 DIAGNOSIS — Z860101 Personal history of adenomatous and serrated colon polyps: Secondary | ICD-10-CM

## 2018-12-06 DIAGNOSIS — Z85038 Personal history of other malignant neoplasm of large intestine: Secondary | ICD-10-CM | POA: Diagnosis not present

## 2018-12-06 NOTE — Patient Instructions (Signed)
Take benefiber 1 tablespoon three times a day with meals  Do a trial of a Lactose free diet for 1-2 weeks   Lactose-Free Diet, Adult If you have lactose intolerance, you are not able to digest lactose. Lactose is a natural sugar found mainly in dairy milk and dairy products. You may need to avoid all foods and beverages that contain lactose. A lactose-free diet can help you do this. Which foods have lactose? Lactose is found in dairy milk and dairy products, such as:  Yogurt.  Cheese.  Butter.  Margarine.  Sour cream.  Cream.  Whipped toppings and nondairy creamers.  Ice cream and other dairy-based desserts. Lactose is also found in foods or products made with dairy milk or milk ingredients. To find out whether a food contains dairy milk or a milk ingredient, look at the ingredients list. Avoid foods with the statement "May contain milk" and foods that contain:  Milk powder.  Whey.  Curd.  Caseinate.  Lactose.  Lactalbumin.  Lactoglobulin. What are alternatives to dairy milk and foods made with milk products?  Lactose-free milk.  Soy milk with added calcium and vitamin D.  Almond milk, coconut milk, rice milk, or other nondairy milk alternatives with added calcium and vitamin D. Note that these are low in protein.  Soy products, such as soy yogurt, soy cheese, soy ice cream, and soy-based sour cream.  Other nut milk products, such as almond yogurt, almond cheese, cashew yogurt, cashew cheese, cashew ice cream, coconut yogurt, and coconut ice cream. What are tips for following this plan?  Do not consume foods, beverages, vitamins, minerals, or medicines containing lactose. Read ingredient lists carefully.  Look for the words "lactose-free" on labels.  Use lactase enzyme drops or tablets as directed by your health care provider.  Use lactose-free milk or a milk alternative, such as soy milk or almond milk, for drinking and cooking.  Make sure you get enough  calcium and vitamin D in your diet. A lactose-free eating plan can be lacking in these important nutrients.  Take calcium and vitamin D supplements as directed by your health care provider. Talk to your health care provider about supplements if you are not able to get enough calcium and vitamin D from food. What foods can I eat?  Fruits All fresh, canned, frozen, or dried fruits that are not processed with lactose. Vegetables All fresh, frozen, and canned vegetables without cheese, cream, or butter sauces. Grains Any that are not made with dairy milk or dairy products. Meats and other proteins Any meat, fish, poultry, and other protein sources that are not made with dairy milk or dairy products. Soy cheese and yogurt. Fats and oils Any that are not made with dairy milk or dairy products. Beverages Lactose-free milk. Soy, rice, or almond milk with added calcium and vitamin D. Fruit and vegetable juices. Sweets and desserts Any that are not made with dairy milk or dairy products. Seasonings and condiments Any that are not made with dairy milk or dairy products. Calcium Calcium is found in many foods that contain lactose and is important for bone health. The amount of calcium you need depends on your age:  Adults younger than 50 years: 1,000 mg of calcium a day.  Adults older than 50 years: 1,200 mg of calcium a day. If you are not getting enough calcium, you may get it from other sources, including:  Orange juice with calcium added. There are 300-350 mg of calcium in 1 cup of orange  juice.  Calcium-fortified soy milk. There are 300-400 mg of calcium in 1 cup of calcium-fortified soy milk.  Calcium-fortified rice or almond milk. There are 300 mg of calcium in 1 cup of calcium-fortified rice or almond milk.  Calcium-fortified breakfast cereals. There are 100-1,000 mg of calcium in calcium-fortified breakfast cereals.  Spinach, cooked. There are 145 mg of calcium in  cup of cooked  spinach.  Edamame, cooked. There are 130 mg of calcium in  cup of cooked edamame.  Collard greens, cooked. There are 125 mg of calcium in  cup of cooked collard greens.  Kale, frozen or cooked. There are 90 mg of calcium in  cup of cooked or frozen kale.  Almonds. There are 95 mg of calcium in  cup of almonds.  Broccoli, cooked. There are 60 mg of calcium in 1 cup of cooked broccoli. The items listed above may not be a complete list of recommended foods and beverages. Contact a dietitian for more options. What foods are not recommended? Fruits None, unless they are made with dairy milk or dairy products. Vegetables None, unless they are made with dairy milk or dairy products. Grains Any grains that are made with dairy milk or dairy products. Meats and other proteins None, unless they are made with dairy milk or dairy products. Dairy All dairy products, including milk, goat's milk, buttermilk, kefir, acidophilus milk, flavored milk, evaporated milk, condensed milk, dulce de [place], eggnog, yogurt, cheese, and cheese spreads. Fats and oils Any that are made with milk or milk products. Margarines and salad dressings that contain milk or cheese. Cream. Half and half. Cream cheese. Sour cream. Chip dips made with sour cream or yogurt. Beverages Hot chocolate. Cocoa with lactose. Instant iced teas. Powdered fruit drinks. Smoothies made with dairy milk or yogurt. Sweets and desserts Any that are made with milk or milk products. Seasonings and condiments Chewing gum that has lactose. Spice blends if they contain lactose. Artificial sweeteners that contain lactose. Nondairy creamers. The items listed above may not be a complete list of foods and beverages to avoid. Contact a dietitian for more information. Summary  If you are lactose intolerant, it means that you have a hard time digesting lactose, a natural sugar found in milk and milk products.  Following a lactose-free diet can help  you manage this condition.  Calcium is important for bone health and is found in many foods that contain lactose. Talk with your health care provider about other sources of calcium. This information is not intended to replace advice given to you by your health care provider. Make sure you discuss any questions you have with your health care provider. Document Released: 07/08/2001 Document Revised: 02/13/2017 Document Reviewed: 02/13/2017 Elsevier Patient Education  2020 [employer].
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
## 2018-12-09 ENCOUNTER — Encounter: Payer: Self-pay | Admitting: Gastroenterology

## 2018-12-09 ENCOUNTER — Ambulatory Visit
Admission: RE | Admit: 2018-12-09 | Discharge: 2018-12-09 | Disposition: A | Discharge status: Home / Self Care | Payer: Medicare Other | Source: Ambulatory Visit | Attending: Neurological Surgery | Admitting: Neurological Surgery

## 2018-12-09 ENCOUNTER — Other Ambulatory Visit: Payer: Self-pay

## 2018-12-09 DIAGNOSIS — G935 Compression of brain: Secondary | ICD-10-CM

## 2019-01-17 ENCOUNTER — Ambulatory Visit: Payer: Medicare Other | Admitting: Gastroenterology

## 2019-02-27 DIAGNOSIS — M542 Cervicalgia: Secondary | ICD-10-CM | POA: Diagnosis not present

## 2019-04-03 DIAGNOSIS — M25511 Pain in right shoulder: Secondary | ICD-10-CM | POA: Diagnosis not present

## 2019-04-08 DIAGNOSIS — M19011 Primary osteoarthritis, right shoulder: Secondary | ICD-10-CM | POA: Diagnosis not present

## 2019-04-08 DIAGNOSIS — M25511 Pain in right shoulder: Secondary | ICD-10-CM | POA: Diagnosis not present

## 2019-04-08 DIAGNOSIS — M6281 Muscle weakness (generalized): Secondary | ICD-10-CM | POA: Diagnosis not present

## 2019-04-08 DIAGNOSIS — M25611 Stiffness of right shoulder, not elsewhere classified: Secondary | ICD-10-CM | POA: Diagnosis not present

## 2019-04-15 DIAGNOSIS — M25511 Pain in right shoulder: Secondary | ICD-10-CM | POA: Diagnosis not present

## 2019-04-15 DIAGNOSIS — M6281 Muscle weakness (generalized): Secondary | ICD-10-CM | POA: Diagnosis not present

## 2019-04-15 DIAGNOSIS — M25611 Stiffness of right shoulder, not elsewhere classified: Secondary | ICD-10-CM | POA: Diagnosis not present

## 2019-04-15 DIAGNOSIS — M19011 Primary osteoarthritis, right shoulder: Secondary | ICD-10-CM | POA: Diagnosis not present

## 2019-04-17 DIAGNOSIS — M19011 Primary osteoarthritis, right shoulder: Secondary | ICD-10-CM | POA: Diagnosis not present

## 2019-04-17 DIAGNOSIS — M25511 Pain in right shoulder: Secondary | ICD-10-CM | POA: Diagnosis not present

## 2019-04-17 DIAGNOSIS — M6281 Muscle weakness (generalized): Secondary | ICD-10-CM | POA: Diagnosis not present

## 2019-04-17 DIAGNOSIS — M25611 Stiffness of right shoulder, not elsewhere classified: Secondary | ICD-10-CM | POA: Diagnosis not present

## 2019-04-22 DIAGNOSIS — M19011 Primary osteoarthritis, right shoulder: Secondary | ICD-10-CM | POA: Diagnosis not present

## 2019-04-22 DIAGNOSIS — M25511 Pain in right shoulder: Secondary | ICD-10-CM | POA: Diagnosis not present

## 2019-04-22 DIAGNOSIS — M6281 Muscle weakness (generalized): Secondary | ICD-10-CM | POA: Diagnosis not present

## 2019-04-22 DIAGNOSIS — M25611 Stiffness of right shoulder, not elsewhere classified: Secondary | ICD-10-CM | POA: Diagnosis not present

## 2019-04-24 DIAGNOSIS — M25611 Stiffness of right shoulder, not elsewhere classified: Secondary | ICD-10-CM | POA: Diagnosis not present

## 2019-04-24 DIAGNOSIS — M6281 Muscle weakness (generalized): Secondary | ICD-10-CM | POA: Diagnosis not present

## 2019-04-24 DIAGNOSIS — M19011 Primary osteoarthritis, right shoulder: Secondary | ICD-10-CM | POA: Diagnosis not present

## 2019-04-24 DIAGNOSIS — M25511 Pain in right shoulder: Secondary | ICD-10-CM | POA: Diagnosis not present

## 2019-04-29 DIAGNOSIS — M19011 Primary osteoarthritis, right shoulder: Secondary | ICD-10-CM | POA: Diagnosis not present

## 2019-04-29 DIAGNOSIS — M25611 Stiffness of right shoulder, not elsewhere classified: Secondary | ICD-10-CM | POA: Diagnosis not present

## 2019-04-29 DIAGNOSIS — M6281 Muscle weakness (generalized): Secondary | ICD-10-CM | POA: Diagnosis not present

## 2019-04-29 DIAGNOSIS — M25511 Pain in right shoulder: Secondary | ICD-10-CM | POA: Diagnosis not present

## 2019-05-06 DIAGNOSIS — M25611 Stiffness of right shoulder, not elsewhere classified: Secondary | ICD-10-CM | POA: Diagnosis not present

## 2019-05-06 DIAGNOSIS — M6281 Muscle weakness (generalized): Secondary | ICD-10-CM | POA: Diagnosis not present

## 2019-05-06 DIAGNOSIS — M25511 Pain in right shoulder: Secondary | ICD-10-CM | POA: Diagnosis not present

## 2019-05-06 DIAGNOSIS — M19011 Primary osteoarthritis, right shoulder: Secondary | ICD-10-CM | POA: Diagnosis not present

## 2019-05-08 DIAGNOSIS — M25511 Pain in right shoulder: Secondary | ICD-10-CM | POA: Diagnosis not present

## 2019-05-08 DIAGNOSIS — M6281 Muscle weakness (generalized): Secondary | ICD-10-CM | POA: Diagnosis not present

## 2019-05-08 DIAGNOSIS — M25611 Stiffness of right shoulder, not elsewhere classified: Secondary | ICD-10-CM | POA: Diagnosis not present

## 2019-05-08 DIAGNOSIS — M19011 Primary osteoarthritis, right shoulder: Secondary | ICD-10-CM | POA: Diagnosis not present

## 2019-07-03 DIAGNOSIS — M19011 Primary osteoarthritis, right shoulder: Secondary | ICD-10-CM | POA: Diagnosis not present

## 2019-07-04 ENCOUNTER — Other Ambulatory Visit: Payer: Self-pay

## 2019-07-04 ENCOUNTER — Ambulatory Visit (INDEPENDENT_AMBULATORY_CARE_PROVIDER_SITE_OTHER): Payer: Medicare PPO

## 2019-07-04 VITALS — BP 120/80 | HR 68 | Temp 97.9°F | Ht 60.0 in | Wt 212.6 lb

## 2019-07-04 DIAGNOSIS — Z0000 Encounter for general adult medical examination without abnormal findings: Secondary | ICD-10-CM

## 2019-07-04 NOTE — Patient Instructions (Signed)
Lynn Ray , Thank you for taking time to come for your Medicare Wellness Visit. I appreciate your ongoing commitment to your health goals. Please review the following plan we discussed and let me know if I can assist you in the future.   Screening recommendations/referrals: Colonoscopy: 11/10/2015 Mammogram: 06/19/2016 Bone Density: 05/14/2013 Recommended yearly ophthalmology/optometry visit for glaucoma screening and checkup Recommended yearly dental visit for hygiene and checkup  Vaccinations: Influenza vaccine: 11/20/2018 Pneumococcal vaccine: completed Tdap vaccine: 07/11/2017; due every 10 years Shingles vaccine: completed   Covid-19:completed  Advanced directives: Yes  Conditions/risks identified: Yes  Next appointment: Please schedule your 1 year Medicare Wellness Visit with your Health Coach.   Preventive Care 65 Years and Older, Female Preventive care refers to lifestyle choices and visits with your health care provider that can promote health and wellness. What does preventive care include?  A yearly physical exam. This is also called an annual well check.  Dental exams once or twice a year.  Routine eye exams. Ask your health care provider how often you should have your eyes checked.  Personal lifestyle choices, including:  Daily care of your teeth and gums.  Regular physical activity.  Eating a healthy diet.  Avoiding tobacco and drug use.  Limiting alcohol use.  Practicing safe sex.  Taking low-dose aspirin every day.  Taking vitamin and mineral supplements as recommended by your health care provider. What happens during an annual well check? The services and screenings done by your health care provider during your annual well check will depend on your age, overall health, lifestyle risk factors, and family history of disease. Counseling  Your health care provider may ask you questions about your:  Alcohol use.  Tobacco use.  Drug use.  Emotional  well-being.  Home and relationship well-being.  Sexual activity.  Eating habits.  History of falls.  Memory and ability to understand (cognition).  Work and work Statistician.  Reproductive health. Screening  You may have the following tests or measurements:  Height, weight, and BMI.  Blood pressure.  Lipid and cholesterol levels. These may be checked every 5 years, or more frequently if you are over 34 years old.  Skin check.  Lung cancer screening. You may have this screening every year starting at age 60 if you have a 30-pack-year history of smoking and currently smoke or have quit within the past 15 years.  Fecal occult blood test (FOBT) of the stool. You may have this test every year starting at age 28.  Flexible sigmoidoscopy or colonoscopy. You may have a sigmoidoscopy every 5 years or a colonoscopy every 10 years starting at age 37.  Hepatitis C blood test.  Hepatitis B blood test.  Sexually transmitted disease (STD) testing.  Diabetes screening. This is done by checking your blood sugar (glucose) after you have not eaten for a while (fasting). You may have this done every 1-3 years.  Bone density scan. This is done to screen for osteoporosis. You may have this done starting at age 69.  Mammogram. This may be done every 1-2 years. Talk to your health care provider about how often you should have regular mammograms. Talk with your health care provider about your test results, treatment options, and if necessary, the need for more tests. Vaccines  Your health care provider may recommend certain vaccines, such as:  Influenza vaccine. This is recommended every year.  Tetanus, diphtheria, and acellular pertussis (Tdap, Td) vaccine. You may need a Td booster every 10 years.  Zoster vaccine. You may need this after age 61.  Pneumococcal 13-valent conjugate (PCV13) vaccine. One dose is recommended after age 54.  Pneumococcal polysaccharide (PPSV23) vaccine. One  dose is recommended after age 41. Talk to your health care provider about which screenings and vaccines you need and how often you need them. This information is not intended to replace advice given to you by your health care provider. Make sure you discuss any questions you have with your health care provider. Document Released: 02/12/2015 Document Revised: 10/06/2015 Document Reviewed: 11/17/2014 Elsevier Interactive Patient Education  2017 [place] Prevention in the Home Falls can cause injuries. They can happen to people of all ages. There are many things you can do to make your home safe and to help prevent falls. What can I do on the outside of my home?  Regularly fix the edges of walkways and driveways and fix any cracks.  Remove anything that might make you trip as you walk through a door, such as a raised step or threshold.  Trim any bushes or trees on the path to your home.  Use bright outdoor lighting.  Clear any walking paths of anything that might make someone trip, such as rocks or tools.  Regularly check to see if handrails are loose or broken. Make sure that both sides of any steps have handrails.  Any raised decks and porches should have guardrails on the edges.  Have any leaves, snow, or ice cleared regularly.  Use sand or salt on walking paths during winter.  Clean up any spills in your garage right away. This includes oil or grease spills. What can I do in the bathroom?  Use night lights.  Install grab bars by the toilet and in the tub and shower. Do not use towel bars as grab bars.  Use non-skid mats or decals in the tub or shower.  If you need to sit down in the shower, use a plastic, non-slip stool.  Keep the floor dry. Clean up any water that spills on the floor as soon as it happens.  Remove soap buildup in the tub or shower regularly.  Attach bath mats securely with double-sided non-slip rug tape.  Do not have throw rugs and other  things on the floor that can make you trip. What can I do in the bedroom?  Use night lights.  Make sure that you have a light by your bed that is easy to reach.  Do not use any sheets or blankets that are too big for your bed. They should not hang down onto the floor.  Have a firm chair that has side arms. You can use this for support while you get dressed.  Do not have throw rugs and other things on the floor that can make you trip. What can I do in the kitchen?  Clean up any spills right away.  Avoid walking on wet floors.  Keep items that you use a lot in easy-to-reach places.  If you need to reach something above you, use a strong step stool that has a grab bar.  Keep electrical cords out of the way.  Do not use floor polish or wax that makes floors slippery. If you must use wax, use non-skid floor wax.  Do not have throw rugs and other things on the floor that can make you trip. What can I do with my stairs?  Do not leave any items on the stairs.  Make sure that there are  handrails on both sides of the stairs and use them. Fix handrails that are broken or loose. Make sure that handrails are as long as the stairways.  Check any carpeting to make sure that it is firmly attached to the stairs. Fix any carpet that is loose or worn.  Avoid having throw rugs at the top or bottom of the stairs. If you do have throw rugs, attach them to the floor with carpet tape.  Make sure that you have a light switch at the top of the stairs and the bottom of the stairs. If you do not have them, ask someone to add them for you. What else can I do to help prevent falls?  Wear shoes that:  Do not have high heels.  Have rubber bottoms.  Are comfortable and fit you well.  Are closed at the toe. Do not wear sandals.  If you use a stepladder:  Make sure that it is fully opened. Do not climb a closed stepladder.  Make sure that both sides of the stepladder are locked into place.  Ask  someone to hold it for you, if possible.  Clearly mark and make sure that you can see:  Any grab bars or handrails.  First and last steps.  Where the edge of each step is.  Use tools that help you move around (mobility aids) if they are needed. These include:  Canes.  Walkers.  Scooters.  Crutches.  Turn on the lights when you go into a dark area. Replace any light bulbs as soon as they burn out.  Set up your furniture so you have a clear path. Avoid moving your furniture around.  If any of your floors are uneven, fix them.  If there are any pets around you, be aware of where they are.  Review your medicines with your doctor. Some medicines can make you feel dizzy. This can increase your chance of falling. Ask your doctor what other things that you can do to help prevent falls. This information is not intended to replace advice given to you by your health care provider. Make sure you discuss any questions you have with your health care provider. Document Released: 11/12/2008 Document Revised: 06/24/2015 Document Reviewed: 02/20/2014 Elsevier Interactive Patient Education  2017 [employer].

## 2019-07-04 NOTE — Progress Notes (Signed)
Subjective:   Lynn Ray is a 85 y.o. female who presents for Medicare Annual (Subsequent) preventive examination.  Review of Systems:  No ROS. Medicare Wellness Exam Cardiac Risk Factors include: advanced age (>60men, >12 women);family history of premature cardiovascular disease;hypertension;obesity (BMI >30kg/m2)     Objective:     Vitals: BP 120/80   Pulse 68   Temp 97.9 F (36.6 C)   Ht 5' (1.524 m)   Wt 212 lb 9.6 oz (96.4 kg)   SpO2 98%   BMI 41.52 kg/m   Body mass index is 41.52 kg/m.  Advanced Directives 07/04/2019 07/01/2018 06/18/2017 11/10/2015 10/27/2015 09/12/2014 04/24/2014  Does Patient Have a Medical Advance Directive? Yes Yes Yes Yes Yes No Yes  Type of Advance Directive Living will;Healthcare Power of [place];Living will [place];Living will [place];Living will [place];Living will - -  Does patient want to make changes to medical advance directive? No - Patient declined - - - - - -  Copy of [place] in Chart? No - copy requested No - copy requested No - copy requested - - - -    Tobacco Social History   Tobacco Use  Smoking Status Former Smoker  . Packs/day: 0.25  Smokeless Tobacco Never Used  Tobacco Comment   04/2013,quit     Counseling given: No Comment: 04/2013,quit   Clinical Intake:  Pre-visit preparation completed: Yes  Pain : 0-10 Pain Score: 5  Pain Type: Chronic pain Pain Location: Neck Pain Orientation: Right Pain Radiating Towards: right arm Pain Descriptors / Indicators: Aching, Constant, Nagging, Discomfort Pain Onset: More than a month ago Pain Frequency: Constant Pain Relieving Factors: Aleve Effect of Pain on Daily Activities: Yes; decline on mobility due to pain  Pain Relieving Factors: Aleve  BMI - recorded: 41.52 Nutritional Status: BMI > 30  Obese Nutritional Risks: None Diabetes: No(pre-diabetic per  patient)  How often do you need to have someone help you when you read instructions, pamphlets, or other written materials from your doctor or pharmacy?: 1 - Never What is the last grade level you completed in school?: Master's Degree  Interpreter Needed?: No  Information entered by :: Nicky Milhouse N. Lowell Guitar, LPN  Past Medical History:  Diagnosis Date  . Arthritis   . Cancer [facility]) 2004   rectal cancer  . Cataract    right eye  . Chiari malformation   . Depression   . Diabetes mellitus ([place])    "pre"  . Hypertension   . Sleep apnea    no CPAP  . Thyroid disease    hyporthyroid   Past Surgical History:  Procedure Laterality Date  . CATARACT EXTRACTION Bilateral   . chiari malformation  2013   surgery  . COLONOSCOPY    . FLEXIBLE SIGMOIDOSCOPY    . POLYPECTOMY     Family History  Problem Relation Age of Onset  . Alzheimer's disease Mother   . Stroke Mother        mini  . Heart attack Mother   . Hypertension Mother   . Depression Other   . Anemia Other   . Obesity Other   . Colon cancer Neg Hx   . Esophageal cancer Neg Hx   . Stomach cancer Neg Hx    Social History   Socioeconomic History  . Marital status: Married    Spouse name: Eddie Dibbles  . Number of children: 2  . Years of  education: Masters  . Highest education level: Not on file  Occupational History  . Occupation: retired    Comment: retired  Tobacco Use  . Smoking status: Former Smoker    Packs/day: 0.25  . Smokeless tobacco: Never Used  . Tobacco comment: 04/2013,quit  Substance and Sexual Activity  . Alcohol use: No    Alcohol/week: 0.0 standard drinks  . Drug use: No  . Sexual activity: Not Currently  Other Topics Concern  . Not on file  Social History Narrative   Patient consumes one cup of coffee daily   Social Determinants of Health   Financial Resource Strain:   . Difficulty of Paying Living Expenses:   Food Insecurity:   . Worried About Charity fundraiser in the Last Year:   . Arts development officer in the Last Year:   Transportation Needs:   . Film/video editor (Medical):   Marland Kitchen Lack of Transportation (Non-Medical):   Physical Activity:   . Days of Exercise per Week:   . Minutes of Exercise per Session:   Stress:   . Feeling of Stress :   Social Connections:   . Frequency of Communication with Friends and Family:   . Frequency of Social Gatherings with Friends and Family:   . Attends Religious Services:   . Active Member of Clubs or Organizations:   . Attends Archivist Meetings:   Marland Kitchen Marital Status:     Outpatient Encounter Medications as of 07/04/2019  Medication Sig  . cholecalciferol (VITAMIN D3) 25 MCG (1000 UT) tablet Take 1,000 Units by mouth daily.  . diclofenac sodium (VOLTAREN) 1 % GEL 4 GRAM(S) FOUR TIMES DAILY, AS NEEDED  . hydrochlorothiazide (HYDRODIURIL) 25 MG tablet TAKE 1 TABLET BY MOUTH EVERY DAY  . levothyroxine (SYNTHROID) 100 MCG tablet TAKE 1 TABLET BY MOUTH EVERY DAY  . metoprolol succinate (TOPROL-XL) 50 MG 24 hr tablet TAKE 1 TABLET BY MOUTH EVERY DAY  . vitamin B-12 (CYANOCOBALAMIN) 100 MCG tablet Take 100 mcg by mouth daily.   No facility-administered encounter medications on file as of 07/04/2019.    Activities of Daily Living In your present state of health, do you have any difficulty performing the following activities: 07/04/2019  Hearing? Y  Vision? N  Difficulty concentrating or making decisions? N  Walking or climbing stairs? Y  Dressing or bathing? N  Doing errands, shopping? N  Preparing Food and eating ? N  Using the Toilet? N  In the past six months, have you accidently leaked urine? Y  Do you have problems with loss of bowel control? N  Managing your Medications? N  Managing your Finances? N  Housekeeping or managing your Housekeeping? N  Some recent data might be hidden    Patient Care Team: Hoyt Koch, MD as PCP - General (Internal Medicine)    Assessment:   This is a routine wellness  examination for Lynn Ray.  Exercise Activities and Dietary recommendations Current Exercise Habits: The patient does not participate in regular exercise at present, Exercise limited by: Other - see comments(limited by pain)  Goals    . Client understands the importance of follow-up with providers by attending scheduled visits (pt-stated)     Would like to improve my mobility and maintain my mental status.    . Patient Stated     Monitor my diet for sugar and carbohydrates, increase my physical activity by doing chair exercises and participate with the [employer] exercise for seniors TV program.  Fall Risk Fall Risk  07/04/2019 07/01/2018 06/18/2017 04/16/2014  Falls in the past year? 1 0 No No  Number falls in past yr: 1 0 - -  Injury with Fall? 1 - - -  Comment hit her head; was a bruise; did not go to ER - - -  Risk for fall due to : History of fall(s);Impaired balance/gait;Impaired mobility Impaired balance/gait;Impaired mobility - -  Follow up Falls evaluation completed;Education provided;Falls prevention discussed Falls prevention discussed - -   Is the patient's home free of loose throw rugs in walkways, pet beds, electrical cords, etc?   yes      Grab bars in the bathroom? yes      Handrails on the stairs?   yes      Adequate lighting?   yes  Timed Get Up and Go performed: not indicated  Depression Screen PHQ 2/9 Scores 07/04/2019 07/01/2018 06/18/2017 04/16/2014  PHQ - 2 Score 1 1 1 1   PHQ- 9 Score - 5 4 -     Cognitive Function MMSE - Mini Mental State Exam 06/18/2017  Orientation to time 5  Orientation to Place 5  Registration 3  Attention/ Calculation 4  Recall 3  Language- name 2 objects 2  Language- repeat 1  Language- follow 3 step command 3  Language- read & follow direction 1  Write a sentence 1  Copy design 1  Total score 29        Immunization History  Administered Date(s) Administered  . Fluad Quad(high Dose 65+) 11/20/2018  . Influenza, High Dose Seasonal  PF 12/26/2016, 10/08/2017  . Influenza-Unspecified 10/30/2013  . Pneumococcal Conjugate-13 03/06/2016  . Pneumococcal Polysaccharide-23 06/18/2017  . Tdap 07/11/2017  . Zoster Recombinat (Shingrix) 01/01/2019, 04/29/2019    Qualifies for Shingles Vaccine? completed  Screening Tests Health Maintenance  Topic Date Due  . COVID-19 Vaccine (1) Never done  . DEXA SCAN  Never done  . COLONOSCOPY  11/10/2018  . INFLUENZA VACCINE  08/31/2019  . TETANUS/TDAP  07/12/2027  . PNA vac Low Risk Adult  Completed    Cancer Screenings: Lung: Low Dose CT Chest recommended if Age 57-80 years, 30 pack-year currently smoking OR have quit w/in 15years. Patient does qualify. Breast:  Up to date on Mammogram? Yes, not recommended due to age Up to date of Bone Density/Dexa? Yes Colorectal: Yes, not recommended due to age  Additional Screenings: Hepatitis C Screening: never done     Plan:     Reviewed health maintenance screenings with patient today and relevant education, vaccines, and/or referrals were provided.    Continue doing brain stimulating activities (puzzles, reading, adult coloring books, staying active) to keep memory sharp.    Continue to eat heart healthy diet (full of fruits, vegetables, whole grains, lean protein, water--limit salt, fat, and sugar intake) and increase physical activity as tolerated.  I have personally reviewed and noted the following in the patient's chart:   . Medical and social history . Use of alcohol, tobacco or illicit drugs  . Current medications and supplements . Functional ability and status . Nutritional status . Physical activity . Advanced directives . List of other physicians . Hospitalizations, surgeries, and ER visits in previous 12 months . Vitals . Screenings to include cognitive, depression, and falls . Referrals and appointments  In addition, I have reviewed and discussed with patient certain preventive protocols, quality metrics, and  best practice recommendations. A written personalized care plan for preventive services as well as general preventive health  recommendations were provided to patient.     Sheral Flow, LPN  08/02/7503  Nurse Health Advisor

## 2019-07-17 ENCOUNTER — Encounter: Payer: Self-pay | Admitting: Internal Medicine

## 2019-07-17 ENCOUNTER — Ambulatory Visit (INDEPENDENT_AMBULATORY_CARE_PROVIDER_SITE_OTHER): Payer: Medicare PPO | Admitting: Internal Medicine

## 2019-07-17 ENCOUNTER — Other Ambulatory Visit: Payer: Self-pay

## 2019-07-17 VITALS — BP 136/82 | HR 72 | Temp 98.2°F | Ht 60.0 in | Wt 211.0 lb

## 2019-07-17 DIAGNOSIS — Z0000 Encounter for general adult medical examination without abnormal findings: Secondary | ICD-10-CM

## 2019-07-17 DIAGNOSIS — E039 Hypothyroidism, unspecified: Secondary | ICD-10-CM

## 2019-07-17 DIAGNOSIS — I10 Essential (primary) hypertension: Secondary | ICD-10-CM

## 2019-07-17 DIAGNOSIS — R7303 Prediabetes: Secondary | ICD-10-CM

## 2019-07-17 DIAGNOSIS — Z85038 Personal history of other malignant neoplasm of large intestine: Secondary | ICD-10-CM

## 2019-07-17 LAB — CBC
HCT: 34.8 % — ABNORMAL LOW (ref 36.0–46.0)
Hemoglobin: 11.3 g/dL — ABNORMAL LOW (ref 12.0–15.0)
MCHC: 32.6 g/dL (ref 30.0–36.0)
MCV: 88.4 fl (ref 78.0–100.0)
Platelets: 268 10*3/uL (ref 150.0–400.0)
RBC: 3.94 Mil/uL (ref 3.87–5.11)
RDW: 14.2 % (ref 11.5–15.5)
WBC: 5.9 10*3/uL (ref 4.0–10.5)

## 2019-07-17 LAB — COMPREHENSIVE METABOLIC PANEL
ALT: 17 U/L (ref 0–35)
AST: 22 U/L (ref 0–37)
Albumin: 3.8 g/dL (ref 3.5–5.2)
Alkaline Phosphatase: 84 U/L (ref 39–117)
BUN: 25 mg/dL — ABNORMAL HIGH (ref 6–23)
CO2: 26 mEq/L (ref 19–32)
Calcium: 9.7 mg/dL (ref 8.4–10.5)
Chloride: 107 mEq/L (ref 96–112)
Creatinine, Ser: 2.02 mg/dL — ABNORMAL HIGH (ref 0.40–1.20)
GFR: 28.49 mL/min — ABNORMAL LOW (ref >60.00)
Glucose, Bld: 90 mg/dL (ref 70–99)
Potassium: 3.9 mEq/L (ref 3.5–5.1)
Sodium: 137 mEq/L (ref 135–145)
Total Bilirubin: 0.2 mg/dL (ref 0.2–1.2)
Total Protein: 7.2 g/dL (ref 6.0–8.3)

## 2019-07-17 LAB — LIPID PANEL
Cholesterol: 168 mg/dL (ref 0–200)
HDL: 46.9 mg/dL (ref >39.00)
NonHDL: 121.54
Total CHOL/HDL Ratio: 4
Triglycerides: 281 mg/dL — ABNORMAL HIGH (ref 0.0–149.0)
VLDL: 56.2 mg/dL — ABNORMAL HIGH (ref 0.0–40.0)

## 2019-07-17 LAB — LDL CHOLESTEROL, DIRECT: Direct LDL: 82 mg/dL

## 2019-07-17 LAB — HEMOGLOBIN A1C: Hgb A1c MFr Bld: 6.4 % (ref 4.6–6.5)

## 2019-07-17 LAB — TSH: TSH: 0.72 u[IU]/mL (ref 0.35–4.50)

## 2019-07-17 NOTE — Patient Instructions (Addendum)
We have checked the EKG today of the heart which is normal. We are checking the labs.    Health Maintenance, Female Adopting a healthy lifestyle and getting preventive care are important in promoting health and wellness. Ask your health care provider about:  The right schedule for you to have regular tests and exams.  Things you can do on your own to prevent diseases and keep yourself healthy. What should I know about diet, weight, and exercise? Eat a healthy diet   Eat a diet that includes plenty of vegetables, fruits, low-fat dairy products, and lean protein.  Do not eat a lot of foods that are high in solid fats, added sugars, or sodium. Maintain a healthy weight Body mass index (BMI) is used to identify weight problems. It estimates body fat based on height and weight. Your health care provider can help determine your BMI and help you achieve or maintain a healthy weight. Get regular exercise Get regular exercise. This is one of the most important things you can do for your health. Most adults should:  Exercise for at least 150 minutes each week. The exercise should increase your heart rate and make you sweat (moderate-intensity exercise).  Do strengthening exercises at least twice a week. This is in addition to the moderate-intensity exercise.  Spend less time sitting. Even light physical activity can be beneficial. Watch cholesterol and blood lipids Have your blood tested for lipids and cholesterol at 83 years of age, then have this test every 5 years. Have your cholesterol levels checked more often if:  Your lipid or cholesterol levels are high.  You are older than 83 years of age.  You are at high risk for heart disease. What should I know about cancer screening? Depending on your health history and family history, you may need to have cancer screening at various ages. This may include screening for:  Breast cancer.  Cervical cancer.  Colorectal cancer.  Skin  cancer.  Lung cancer. What should I know about heart disease, diabetes, and high blood pressure? Blood pressure and heart disease  High blood pressure causes heart disease and increases the risk of stroke. This is more likely to develop in people who have high blood pressure readings, are of African descent, or are overweight.  Have your blood pressure checked: ? Every 3-5 years if you are 34-13 years of age. ? Every year if you are 34 years old or older. Diabetes Have regular diabetes screenings. This checks your fasting blood sugar level. Have the screening done:  Once every three years after age 68 if you are at a normal weight and have a low risk for diabetes.  More often and at a younger age if you are overweight or have a high risk for diabetes. What should I know about preventing infection? Hepatitis B If you have a higher risk for hepatitis B, you should be screened for this virus. Talk with your health care provider to find out if you are at risk for hepatitis B infection. Hepatitis C Testing is recommended for:  Everyone born from [birth year] through 1963.  Anyone with known risk factors for hepatitis C. Sexually transmitted infections (STIs)  Get screened for STIs, including gonorrhea and chlamydia, if: ? You are sexually active and are younger than 83 years of age. ? You are older than 83 years of age and your health care provider tells you that you are at risk for this type of infection. ? Your sexual activity has changed since  you were last screened, and you are at increased risk for chlamydia or gonorrhea. Ask your health care provider if you are at risk.  Ask your health care provider about whether you are at high risk for HIV. Your health care provider may recommend a prescription medicine to help prevent HIV infection. If you choose to take medicine to prevent HIV, you should first get tested for HIV. You should then be tested every 3 months for as long as you are taking  the medicine. Pregnancy  If you are about to stop having your period (premenopausal) and you may become pregnant, seek counseling before you get pregnant.  Take 400 to 800 micrograms (mcg) of folic acid every day if you become pregnant.  Ask for birth control (contraception) if you want to prevent pregnancy. Osteoporosis and menopause Osteoporosis is a disease in which the bones lose minerals and strength with aging. This can result in bone fractures. If you are 82 years old or older, or if you are at risk for osteoporosis and fractures, ask your health care provider if you should:  Be screened for bone loss.  Take a calcium or vitamin D supplement to lower your risk of fractures.  Be given hormone replacement therapy (HRT) to treat symptoms of menopause. Follow these instructions at home: Lifestyle  Do not use any products that contain nicotine or tobacco, such as cigarettes, e-cigarettes, and chewing tobacco. If you need help quitting, ask your health care provider.  Do not use street drugs.  Do not share needles.  Ask your health care provider for help if you need support or information about quitting drugs. Alcohol use  Do not drink alcohol if: ? Your health care provider tells you not to drink. ? You are pregnant, may be pregnant, or are planning to become pregnant.  If you drink alcohol: ? Limit how much you use to 0-1 drink a day. ? Limit intake if you are breastfeeding.  Be aware of how much alcohol is in your drink. In the [place] one drink equals one 12 oz bottle of beer (355 mL), one 5 oz glass of wine (148 mL), or one 1 oz glass of hard liquor (44 mL). General instructions  Schedule regular health, dental, and eye exams.  Stay current with your vaccines.  Tell your health care provider if: ? You often feel depressed. ? You have ever been abused or do not feel safe at home. Summary  Adopting a healthy lifestyle and getting preventive care are important in  promoting health and wellness.  Follow your health care provider's instructions about healthy diet, exercising, and getting tested or screened for diseases.  Follow your health care provider's instructions on monitoring your cholesterol and blood pressure. This information is not intended to replace advice given to you by your health care provider. Make sure you discuss any questions you have with your health care provider. Document Revised: 01/09/2018 Document Reviewed: 01/09/2018 Elsevier Patient Education  2020 [employer].

## 2019-07-17 NOTE — Progress Notes (Signed)
   Subjective:   Patient ID: Lynn Ray, female    DOB: [birth year], 85 y.o.   MRN: [id number]  HPI The patient is an 85 YO female coming in for physical.   PMH, [facility], social history reviewed and updated.   Review of Systems  Constitutional: Negative.   HENT: Negative.   Eyes: Negative.   Respiratory: Negative for cough, chest tightness and shortness of breath.   Cardiovascular: Negative for chest pain, palpitations and leg swelling.  Gastrointestinal: Negative for abdominal distention, abdominal pain, constipation, diarrhea, nausea and vomiting.  Musculoskeletal: Negative.   Skin: Negative.   Neurological: Negative.   Psychiatric/Behavioral: Negative.     Objective:  Physical Exam Constitutional:      Appearance: She is well-developed. She is obese.  HENT:     Head: Normocephalic and atraumatic.  Cardiovascular:     Rate and Rhythm: Normal rate and regular rhythm.  Pulmonary:     Effort: Pulmonary effort is normal. No respiratory distress.     Breath sounds: Normal breath sounds. No wheezing or rales.  Abdominal:     General: Bowel sounds are normal. There is no distension.     Palpations: Abdomen is soft.     Tenderness: There is no abdominal tenderness. There is no rebound.  Musculoskeletal:     Cervical back: Normal range of motion.  Skin:    General: Skin is warm and dry.  Neurological:     Mental Status: She is alert and oriented to person, place, and time.     Coordination: Coordination normal.     Vitals:   07/17/19 1346  BP: 136/82  Pulse: 72  Temp: 98.2 F (36.8 C)  TempSrc: Oral  SpO2: 98%  Weight: 211 lb (95.7 kg)  Height: 5' (1.524 m)   EKG: Rate 70, axis normal, interval normal, sinus, no st or t wave changes, no prior to compare  This visit occurred during the SARS-CoV-2 public health emergency.  Safety protocols were in place, including screening questions prior to the visit, additional usage of staff PPE, and extensive cleaning of exam room  while observing appropriate contact time as indicated for disinfecting solutions.   Assessment & Plan:

## 2019-07-18 NOTE — Assessment & Plan Note (Signed)
We discussed risk and benefit of ongoing surveillance. She wishes to defer further screening at this time.

## 2019-07-18 NOTE — Assessment & Plan Note (Signed)
Weight is up and she is working on getting this lowered some.

## 2019-07-18 NOTE — Assessment & Plan Note (Signed)
Checking TSH and adjust synthroid 100 mcg daily as needed.  

## 2019-07-18 NOTE — Assessment & Plan Note (Signed)
EKG done without changes, BP at goal on metoprolol and hctz. Checking CMP and adjust as needed.

## 2019-07-18 NOTE — Assessment & Plan Note (Signed)
Flu shot due next season. Covid-19 up to date. Pneumonia complete. Shingrix counseled. Tetanus up to date. Colonoscopy defers further. Mammogram aged out, pap smear aged [place] and dexa defers further. Counseled about sun safety and mole surveillance. Counseled about the dangers of distracted driving. Given 10 year screening recommendations.

## 2019-07-21 ENCOUNTER — Other Ambulatory Visit: Payer: Self-pay | Admitting: Internal Medicine

## 2019-07-21 DIAGNOSIS — N1832 Chronic kidney disease, stage 3b: Secondary | ICD-10-CM

## 2019-07-22 DIAGNOSIS — H1045 Other chronic allergic conjunctivitis: Secondary | ICD-10-CM | POA: Diagnosis not present

## 2019-07-22 DIAGNOSIS — H04123 Dry eye syndrome of bilateral lacrimal glands: Secondary | ICD-10-CM | POA: Diagnosis not present

## 2019-07-22 DIAGNOSIS — Z961 Presence of intraocular lens: Secondary | ICD-10-CM | POA: Diagnosis not present

## 2019-07-22 DIAGNOSIS — E119 Type 2 diabetes mellitus without complications: Secondary | ICD-10-CM | POA: Diagnosis not present

## 2019-07-22 LAB — HM DIABETES EYE EXAM

## 2019-09-02 DIAGNOSIS — I129 Hypertensive chronic kidney disease with stage 1 through stage 4 chronic kidney disease, or unspecified chronic kidney disease: Secondary | ICD-10-CM | POA: Diagnosis not present

## 2019-09-02 DIAGNOSIS — N184 Chronic kidney disease, stage 4 (severe): Secondary | ICD-10-CM | POA: Diagnosis not present

## 2019-09-02 DIAGNOSIS — T39395A Adverse effect of other nonsteroidal anti-inflammatory drugs [NSAID], initial encounter: Secondary | ICD-10-CM | POA: Diagnosis not present

## 2019-09-02 DIAGNOSIS — N141 Nephropathy induced by other drugs, medicaments and biological substances: Secondary | ICD-10-CM | POA: Diagnosis not present

## 2019-09-02 DIAGNOSIS — E889 Metabolic disorder, unspecified: Secondary | ICD-10-CM | POA: Diagnosis not present

## 2019-09-02 DIAGNOSIS — Z6841 Body Mass Index (BMI) 40.0 and over, adult: Secondary | ICD-10-CM | POA: Diagnosis not present

## 2019-09-02 DIAGNOSIS — E559 Vitamin D deficiency, unspecified: Secondary | ICD-10-CM | POA: Diagnosis not present

## 2019-09-02 DIAGNOSIS — M9080 Osteopathy in diseases classified elsewhere, unspecified site: Secondary | ICD-10-CM | POA: Diagnosis not present

## 2019-10-07 DIAGNOSIS — I10 Essential (primary) hypertension: Secondary | ICD-10-CM | POA: Diagnosis not present

## 2019-10-07 DIAGNOSIS — E039 Hypothyroidism, unspecified: Secondary | ICD-10-CM | POA: Diagnosis not present

## 2019-10-07 DIAGNOSIS — Z8249 Family history of ischemic heart disease and other diseases of the circulatory system: Secondary | ICD-10-CM | POA: Diagnosis not present

## 2019-10-07 DIAGNOSIS — Z87891 Personal history of nicotine dependence: Secondary | ICD-10-CM | POA: Diagnosis not present

## 2019-10-07 DIAGNOSIS — Z85038 Personal history of other malignant neoplasm of large intestine: Secondary | ICD-10-CM | POA: Diagnosis not present

## 2019-10-07 DIAGNOSIS — Z823 Family history of stroke: Secondary | ICD-10-CM | POA: Diagnosis not present

## 2019-10-07 DIAGNOSIS — Z6841 Body Mass Index (BMI) 40.0 and over, adult: Secondary | ICD-10-CM | POA: Diagnosis not present

## 2019-10-28 DIAGNOSIS — N184 Chronic kidney disease, stage 4 (severe): Secondary | ICD-10-CM | POA: Diagnosis not present

## 2019-11-04 DIAGNOSIS — E559 Vitamin D deficiency, unspecified: Secondary | ICD-10-CM | POA: Diagnosis not present

## 2019-11-04 DIAGNOSIS — M5412 Radiculopathy, cervical region: Secondary | ICD-10-CM | POA: Diagnosis not present

## 2019-11-04 DIAGNOSIS — T39395A Adverse effect of other nonsteroidal anti-inflammatory drugs [NSAID], initial encounter: Secondary | ICD-10-CM | POA: Diagnosis not present

## 2019-11-04 DIAGNOSIS — N141 Nephropathy induced by other drugs, medicaments and biological substances: Secondary | ICD-10-CM | POA: Diagnosis not present

## 2019-11-04 DIAGNOSIS — M9080 Osteopathy in diseases classified elsewhere, unspecified site: Secondary | ICD-10-CM | POA: Diagnosis not present

## 2019-11-04 DIAGNOSIS — I129 Hypertensive chronic kidney disease with stage 1 through stage 4 chronic kidney disease, or unspecified chronic kidney disease: Secondary | ICD-10-CM | POA: Diagnosis not present

## 2019-11-04 DIAGNOSIS — E889 Metabolic disorder, unspecified: Secondary | ICD-10-CM | POA: Diagnosis not present

## 2019-11-04 DIAGNOSIS — N184 Chronic kidney disease, stage 4 (severe): Secondary | ICD-10-CM | POA: Diagnosis not present

## 2019-12-07 ENCOUNTER — Other Ambulatory Visit: Payer: Self-pay | Admitting: Internal Medicine

## 2019-12-12 DIAGNOSIS — N184 Chronic kidney disease, stage 4 (severe): Secondary | ICD-10-CM | POA: Diagnosis not present

## 2019-12-22 ENCOUNTER — Ambulatory Visit (INDEPENDENT_AMBULATORY_CARE_PROVIDER_SITE_OTHER): Payer: Medicare PPO | Admitting: Internal Medicine

## 2019-12-22 ENCOUNTER — Encounter: Payer: Self-pay | Admitting: Internal Medicine

## 2019-12-22 ENCOUNTER — Ambulatory Visit (INDEPENDENT_AMBULATORY_CARE_PROVIDER_SITE_OTHER): Payer: Medicare PPO

## 2019-12-22 ENCOUNTER — Other Ambulatory Visit: Payer: Self-pay

## 2019-12-22 VITALS — BP 138/78 | HR 69 | Temp 97.9°F | Ht 60.0 in | Wt 208.0 lb

## 2019-12-22 DIAGNOSIS — G8929 Other chronic pain: Secondary | ICD-10-CM

## 2019-12-22 DIAGNOSIS — E039 Hypothyroidism, unspecified: Secondary | ICD-10-CM | POA: Diagnosis not present

## 2019-12-22 DIAGNOSIS — M25511 Pain in right shoulder: Secondary | ICD-10-CM | POA: Diagnosis not present

## 2019-12-22 DIAGNOSIS — Z23 Encounter for immunization: Secondary | ICD-10-CM

## 2019-12-22 DIAGNOSIS — N184 Chronic kidney disease, stage 4 (severe): Secondary | ICD-10-CM | POA: Diagnosis not present

## 2019-12-22 DIAGNOSIS — M19011 Primary osteoarthritis, right shoulder: Secondary | ICD-10-CM | POA: Diagnosis not present

## 2019-12-22 DIAGNOSIS — E559 Vitamin D deficiency, unspecified: Secondary | ICD-10-CM | POA: Diagnosis not present

## 2019-12-22 DIAGNOSIS — E538 Deficiency of other specified B group vitamins: Secondary | ICD-10-CM

## 2019-12-22 DIAGNOSIS — Z85038 Personal history of other malignant neoplasm of large intestine: Secondary | ICD-10-CM | POA: Diagnosis not present

## 2019-12-22 LAB — CEA: CEA: 2.4 ng/mL

## 2019-12-22 LAB — VITAMIN B12: Vitamin B-12: 865 pg/mL (ref 211–911)

## 2019-12-22 LAB — T4, FREE: Free T4: 0.9 ng/dL (ref 0.60–1.60)

## 2019-12-22 LAB — VITAMIN D 25 HYDROXY (VIT D DEFICIENCY, FRACTURES): VITD: 57.57 ng/mL (ref 30.00–100.00)

## 2019-12-22 LAB — TSH: TSH: 0.74 u[IU]/mL (ref 0.35–4.50)

## 2019-12-22 NOTE — Assessment & Plan Note (Signed)
Concerned that fatigue is related as this was a symptoms at diagnosis years ago. Checking CEA and she has colonoscopy in December already scheduled and she is advised to keep this.

## 2019-12-22 NOTE — Assessment & Plan Note (Signed)
Has mostly been 3b/4 in the last several times checked.

## 2019-12-22 NOTE — Patient Instructions (Signed)
We will check the labs and the x-ray of the shoulder.

## 2019-12-22 NOTE — Assessment & Plan Note (Signed)
Checking right shoulder x-ray. 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 Treat as appropriate. No NSAIDs due to CKD.

## 2019-12-22 NOTE — Progress Notes (Signed)
   Subjective:   Patient ID: Lynn Ray, female    DOB: May 15, 1934, 85 y.o.   MRN: [id number]  HPI The patient is an 85 YO female coming in for fatigue and right shoulder pain. Going on for some time. Sometimes the pain and lack of ROM of the right shoulder makes it difficult to do things and she will tire easily. She does take thyroid medication and denies missing doses or change in dosage recently. Denies fevers or chills. Weight is stable. Denies excessive tiredness when she gets up. Overall just gets tired during the day. 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   Review of Systems  Constitutional: Positive for activity change and fatigue.  HENT: Negative.   Eyes: Negative.   Respiratory: Negative for cough, chest tightness and shortness of breath.   Cardiovascular: Negative for chest pain, palpitations and leg swelling.  Gastrointestinal: Negative for abdominal distention, abdominal pain, constipation, diarrhea, nausea and vomiting.  Endocrine: Negative for cold intolerance and heat intolerance.  Musculoskeletal: Positive for arthralgias and myalgias.  Skin: Negative.   Neurological: Negative.   Psychiatric/Behavioral: Negative.     Objective:  Physical Exam Constitutional:      Appearance: She is well-developed. She is obese.  HENT:     Head: Normocephalic and atraumatic.  Cardiovascular:     Rate and Rhythm: Normal rate and regular rhythm.  Pulmonary:     Effort: Pulmonary effort is normal. No respiratory distress.     Breath sounds: Normal breath sounds. No wheezing or rales.  Abdominal:     General: Bowel sounds are normal. There is no distension.     Palpations: Abdomen is soft.     Tenderness: There is no abdominal tenderness. There is no rebound.  Musculoskeletal:        General: Tenderness present.     Cervical back: Normal range of motion.  Skin:    General: Skin is warm and dry.   Neurological:     Mental Status: She is alert and oriented to person, place, and time.     Coordination: Coordination normal.     Vitals:   12/22/19 1354  BP: 138/78  Pulse: 69  Temp: 97.9 F (36.6 C)  TempSrc: Oral  SpO2: 97%  Weight: 208 lb (94.3 kg)  Height: 5' (1.524 m)    This visit occurred during the SARS-CoV-2 public health emergency.  Safety protocols were in place, including screening questions prior to the visit, additional usage of staff PPE, and extensive cleaning of exam room while observing appropriate contact time as indicated for disinfecting solutions.   Assessment & Plan:  Flu shot given at visit

## 2019-12-22 NOTE — Assessment & Plan Note (Signed)
Checking TSH and adjust synthroid as needed.  

## 2019-12-31 NOTE — Progress Notes (Signed)
No show

## 2020-01-01 ENCOUNTER — Ambulatory Visit: Payer: Medicare PPO | Admitting: Family Medicine

## 2020-01-04 ENCOUNTER — Other Ambulatory Visit: Payer: Self-pay | Admitting: Internal Medicine

## 2020-01-05 ENCOUNTER — Ambulatory Visit: Payer: Medicare PPO | Admitting: Gastroenterology

## 2020-01-05 ENCOUNTER — Encounter: Payer: Self-pay | Admitting: Gastroenterology

## 2020-01-05 VITALS — BP 130/80 | HR 78 | Ht 59.0 in | Wt 206.4 lb

## 2020-01-05 DIAGNOSIS — R194 Change in bowel habit: Secondary | ICD-10-CM

## 2020-01-05 DIAGNOSIS — R197 Diarrhea, unspecified: Secondary | ICD-10-CM | POA: Diagnosis not present

## 2020-01-05 NOTE — Progress Notes (Signed)
Lynn Ray    [id number]    01/06/1935  Primary Care Physician:Crawford, Real Cons, MD  Referring Physician: Hoyt Koch, MD [address] [place],  [place] [address]   Chief complaint:  Change in bowel habits  HPI:  85 year old very pleasant female with history of morbid obesity, hypertension, COPD, GERD and colorectal cancer 2004  here for follow-up visit with complaints of change in bowel habits  Complains of change in bowel habits with alternating constipation and diarrhea, has fecal urgency, also has excessive gas and flatulence.    She does not feel like she is completely evacuating when she tries to defecate.  She feels like something is obstructing.  She is worried about colon polyps or colon cancer. She does not drink milk as she is lactose intolerant but still eats cheese and ice cream intermittently. Denies any melena or blood per rectum.  No recent change in weight or appetite.  Colonoscopy November 10, 2015: Four sessile polyps removed, left-sided diverticulosis and internal hemorrhoids  No family history of colon cancer or GI malignancy.   Outpatient Encounter Medications as of 01/05/2020  Medication Sig  . cholecalciferol (VITAMIN D3) 25 MCG (1000 UT) tablet Take 1,000 Units by mouth daily.  . diclofenac sodium (VOLTAREN) 1 % GEL 4 GRAM(S) FOUR TIMES DAILY, AS NEEDED  . hydrochlorothiazide (HYDRODIURIL) 25 MG tablet TAKE 1 TABLET BY MOUTH EVERY DAY  . levothyroxine (SYNTHROID) 100 MCG tablet TAKE 1 TABLET BY MOUTH EVERY DAY  . metoprolol succinate (TOPROL-XL) 50 MG 24 hr tablet TAKE 1 TABLET BY MOUTH EVERY DAY  . vitamin B-12 (CYANOCOBALAMIN) 100 MCG tablet Take 100 mcg by mouth daily.   No facility-administered encounter medications on file as of 01/05/2020.    Allergies as of 01/05/2020 - Review Complete 12/22/2019  Allergen Reaction Noted  . Aspirin Other (See Comments)   . Tape Rash 01/28/2014    Past Medical History:   Diagnosis Date  . Arthritis   . Cancer [facility]) 2004   rectal cancer  . Cataract    right eye  . Chiari malformation   . Depression   . Diabetes mellitus ([place])    "pre"  . Hypertension   . Sleep apnea    no CPAP  . Thyroid disease    hyporthyroid    Past Surgical History:  Procedure Laterality Date  . CATARACT EXTRACTION Bilateral   . chiari malformation  2013   surgery  . COLONOSCOPY    . FLEXIBLE SIGMOIDOSCOPY    . POLYPECTOMY      Family History  Problem Relation Age of Onset  . Alzheimer's disease Mother   . Stroke Mother        mini  . Heart attack Mother   . Hypertension Mother   . Depression Other   . Anemia Other   . Obesity Other   . Colon cancer Neg Hx   . Esophageal cancer Neg Hx   . Stomach cancer Neg Hx     Social History   Socioeconomic History  . Marital status: Married    Spouse name: Eddie Dibbles  . Number of children: 2  . Years of education: Masters  . Highest education level: Not on file  Occupational History  . Occupation: retired    Comment: retired  Tobacco Use  . Smoking status: Former Smoker    Packs/day: 0.25  . Smokeless tobacco: Never Used  .  Tobacco comment: 04/2013,quit  Vaping Use  . Vaping Use: Never used  Substance and Sexual Activity  . Alcohol use: No    Alcohol/week: 0.0 standard drinks  . Drug use: No  . Sexual activity: Not Currently  Other Topics Concern  . Not on file  Social History Narrative   Patient consumes one cup of coffee daily   Social Determinants of Health   Financial Resource Strain:   . Difficulty of Paying Living Expenses: Not on file  Food Insecurity:   . Worried About Charity fundraiser in the Last Year: Not on file  . Ran Out of Food in the Last Year: Not on file  Transportation Needs:   . Lack of Transportation (Medical): Not on file  . Lack of Transportation (Non-Medical): Not on file  Physical Activity:   . Days of Exercise per Week: Not on file  . Minutes of Exercise per Session: Not  on file  Stress:   . Feeling of Stress : Not on file  Social Connections:   . Frequency of Communication with Friends and Family: Not on file  . Frequency of Social Gatherings with Friends and Family: Not on file  . Attends Religious Services: Not on file  . Active Member of Clubs or Organizations: Not on file  . Attends Archivist Meetings: Not on file  . Marital Status: Not on file  Intimate Partner Violence:   . Fear of Current or Ex-Partner: Not on file  . Emotionally Abused: Not on file  . Physically Abused: Not on file  . Sexually Abused: Not on file      Review of systems: All other review of systems negative except as mentioned in the HPI.   Physical Exam: Vitals:   01/05/20 1404  BP: 130/80  Pulse: 78  SpO2: 98%   Body mass index is 41.69 kg/m. Gen:      No acute distress HEENT:  sclera anicteric Abd:      soft, non-tender; no palpable masses, no distension Ext:    No edema Neuro: alert and oriented x 3 Psych: normal mood and affect  Data Reviewed:  Reviewed labs, radiology imaging, old records and pertinent past GI work up   Assessment and Plan/Recommendations:  85 year old very pleasant female with personal history of colon cancer in 2004 s/p resection, history of adenomatous colon polyps that were removed during colonoscopy in 2017  Recent change in bowel habits with intermittent diarrhea, incomplete evacuation and fecal urgency Advised patient to do a trial of lactose-free diet for 1 to 2 weeks Add Benefiber 1 tablespoon 2-3 times daily with meals  Patient is worried about potential recurrent colon cancer/colon polyps She is also worried about potential complications associated with anesthesia and colonoscopy given her age  Discussed benefits and risks associated with colonoscopy in detail.  Patient wants to hold off for now and reassess at follow-up visit in 4 to 6 weeks  Will consider colonoscopy for further evaluation if her symptoms  are persistent   The patient was provided an opportunity to ask questions and all were answered. The patient agreed with the plan and demonstrated an understanding of the instructions.  Damaris Hippo , MD    CC: Hoyt Koch, *

## 2020-01-05 NOTE — Patient Instructions (Signed)
Take Benefiber 1 tablespoon 2-3 times daily with meals  Trial of lactose free diet for 1-2 weeks   Lactose-Free Diet, Adult If you have lactose intolerance, you are not able to digest lactose. Lactose is a natural sugar found mainly in dairy milk and dairy products. You may need to avoid all foods and beverages that contain lactose. A lactose-free diet can help you do this. Which foods have lactose? Lactose is found in dairy milk and dairy products, such as:  Yogurt.  Cheese.  Butter.  Margarine.  Sour cream.  Cream.  Whipped toppings and nondairy creamers.  Ice cream and other dairy-based desserts. Lactose is also found in foods or products made with dairy milk or milk ingredients. To find out whether a food contains dairy milk or a milk ingredient, look at the ingredients list. Avoid foods with the statement "May contain milk" and foods that contain:  Milk powder.  Whey.  Curd.  Caseinate.  Lactose.  Lactalbumin.  Lactoglobulin. What are alternatives to dairy milk and foods made with milk products?  Lactose-free milk.  Soy milk with added calcium and vitamin D.  Almond milk, coconut milk, rice milk, or other nondairy milk alternatives with added calcium and vitamin D. Note that these are low in protein.  Soy products, such as soy yogurt, soy cheese, soy ice cream, and soy-based sour cream.  Other nut milk products, such as almond yogurt, almond cheese, cashew yogurt, cashew cheese, cashew ice cream, coconut yogurt, and coconut ice cream. What are tips for following this plan?  Do not consume foods, beverages, vitamins, minerals, or medicines containing lactose. Read ingredient lists carefully.  Look for the words "lactose-free" on labels.  Use lactase enzyme drops or tablets as directed by your health care provider.  Use lactose-free milk or a milk alternative, such as soy milk or almond milk, for drinking and cooking.  Make sure you get enough calcium  and vitamin D in your diet. A lactose-free eating plan can be lacking in these important nutrients.  Take calcium and vitamin D supplements as directed by your health care provider. Talk to your health care provider about supplements if you are not able to get enough calcium and vitamin D from food. What foods can I eat?  Fruits All fresh, canned, frozen, or dried fruits that are not processed with lactose. Vegetables All fresh, frozen, and canned vegetables without cheese, cream, or butter sauces. Grains Any that are not made with dairy milk or dairy products. Meats and other proteins Any meat, fish, poultry, and other protein sources that are not made with dairy milk or dairy products. Soy cheese and yogurt. Fats and oils Any that are not made with dairy milk or dairy products. Beverages Lactose-free milk. Soy, rice, or almond milk with added calcium and vitamin D. Fruit and vegetable juices. Sweets and desserts Any that are not made with dairy milk or dairy products. Seasonings and condiments Any that are not made with dairy milk or dairy products. Calcium Calcium is found in many foods that contain lactose and is important for bone health. The amount of calcium you need depends on your age:  Adults younger than 50 years: 1,000 mg of calcium a day.  Adults older than 50 years: 1,200 mg of calcium a day. If you are not getting enough calcium, you may get it from other sources, including:  Orange juice with calcium added. There are 300-350 mg of calcium in 1 cup of orange juice.  Calcium-fortified soy  milk. There are 300-400 mg of calcium in 1 cup of calcium-fortified soy milk.  Calcium-fortified rice or almond milk. There are 300 mg of calcium in 1 cup of calcium-fortified rice or almond milk.  Calcium-fortified breakfast cereals. There are 100-1,000 mg of calcium in calcium-fortified breakfast cereals.  Spinach, cooked. There are 145 mg of calcium in  cup of cooked  spinach.  Edamame, cooked. There are 130 mg of calcium in  cup of cooked edamame.  Collard greens, cooked. There are 125 mg of calcium in  cup of cooked collard greens.  Kale, frozen or cooked. There are 90 mg of calcium in  cup of cooked or frozen kale.  Almonds. There are 95 mg of calcium in  cup of almonds.  Broccoli, cooked. There are 60 mg of calcium in 1 cup of cooked broccoli. The items listed above may not be a complete list of recommended foods and beverages. Contact a dietitian for more options. What foods are not recommended? Fruits None, unless they are made with dairy milk or dairy products. Vegetables None, unless they are made with dairy milk or dairy products. Grains Any grains that are made with dairy milk or dairy products. Meats and other proteins None, unless they are made with dairy milk or dairy products. Dairy All dairy products, including milk, goat's milk, buttermilk, kefir, acidophilus milk, flavored milk, evaporated milk, condensed milk, dulce de [place], eggnog, yogurt, cheese, and cheese spreads. Fats and oils Any that are made with milk or milk products. Margarines and salad dressings that contain milk or cheese. Cream. Half and half. Cream cheese. Sour cream. Chip dips made with sour cream or yogurt. Beverages Hot chocolate. Cocoa with lactose. Instant iced teas. Powdered fruit drinks. Smoothies made with dairy milk or yogurt. Sweets and desserts Any that are made with milk or milk products. Seasonings and condiments Chewing gum that has lactose. Spice blends if they contain lactose. Artificial sweeteners that contain lactose. Nondairy creamers. The items listed above may not be a complete list of foods and beverages to avoid. Contact a dietitian for more information. Summary  If you are lactose intolerant, it means that you have a hard time digesting lactose, a natural sugar found in milk and milk products.  Following a lactose-free diet can help  you manage this condition.  Calcium is important for bone health and is found in many foods that contain lactose. Talk with your health care provider about other sources of calcium. This information is not intended to replace advice given to you by your health care provider. Make sure you discuss any questions you have with your health care provider. Document Revised: 02/13/2017 Document Reviewed: 02/13/2017 Elsevier Patient Education  [place].  I appreciate the  opportunity to care for you  Thank You   Harl Bowie , MD

## 2020-01-06 ENCOUNTER — Encounter: Payer: Self-pay | Admitting: Gastroenterology

## 2020-01-08 ENCOUNTER — Other Ambulatory Visit: Payer: Self-pay | Admitting: Internal Medicine

## 2020-01-10 ENCOUNTER — Other Ambulatory Visit: Payer: Self-pay | Admitting: Internal Medicine

## 2020-01-12 ENCOUNTER — Telehealth: Payer: Self-pay | Admitting: Internal Medicine

## 2020-01-12 NOTE — Telephone Encounter (Signed)
1.Medication Requested: levothyroxine (SYNTHROID) 100 MCG     2. Pharmacy (Name, Street, [place]):  [id number] - [place], [place]   3. On Med List: yes   4. Last Visit with PCP: 11.22.2021   5. Next visit date with PCP: n/a   Agent: Please be advised that RX refills may take up to 3 business days. We ask that you follow-up with your pharmacy.

## 2020-01-12 NOTE — Telephone Encounter (Signed)
Refill was previously sent 12/03/19. See meds.

## 2020-01-13 ENCOUNTER — Other Ambulatory Visit: Payer: Self-pay | Admitting: Internal Medicine

## 2020-01-14 NOTE — Telephone Encounter (Signed)
Upon review of the chart, transmission failed on refill on 12/30/19.  Medication now refilled successfully to pharmacy on file.

## 2020-01-14 NOTE — Telephone Encounter (Signed)
Patient is requesting a call back. She has an appointment on 12.16.21 with pcp.

## 2020-01-15 ENCOUNTER — Ambulatory Visit: Payer: Medicare PPO | Admitting: Internal Medicine

## 2020-01-15 NOTE — Telephone Encounter (Signed)
Upon review of chart, pt canceled her appt for today b/c she said it was not needed (for med refill)

## 2020-01-19 NOTE — Progress Notes (Signed)
I, Peterson Lombard, LAT, ATC acting as a scribe for Lynn Leader, MD.  Subjective:    I'm seeing this patient as a consultation for:  Dr. Pricilla Holm. Note will be routed back to referring provider/PCP.  CC: Chronic right shoulder pain  HPI: Pt is an 85 y/o female presenting w/ c/o right shoulder pain. Pt was previously scheduled to see Dr. Georgina Snell on 12/2, but was a no-show. Pt reports shoulder pain has been ongoing for several years with no known MOI. Pt locates pain to R trap and R neck, but sometimes pain is in arm. Pt reports pain w/ shoulder flexion, esp over 90. Prior surgery for a Chiari Malformation 2002. Pan radiates down arm and into hand. Pt reports she is unable to write calligraphy.   UE numbness/tingling: yes UE weakness: yes Aggravates: weather is bad Rx tried: Tylenol  Dx imaging: 12/22/19 R shoulder XR  Past medical history, Surgical history, Family history, Social history, Allergies, and medications have been entered into the medical record, reviewed.   Review of Systems: No new headache, visual changes, nausea, vomiting, diarrhea, constipation, dizziness, abdominal pain, skin rash, fevers, chills, night sweats, weight loss, swollen lymph nodes, body aches, joint swelling, muscle aches, chest pain, shortness of breath, mood changes, visual or auditory hallucinations.   Objective:    Vitals:   01/20/20 1348  BP: 138/80  Pulse: 79  SpO2: 98%   General: Well Developed, well nourished, and in no acute distress.  Neuro/Psych: Alert and oriented x3, extra-ocular muscles intact, able to move all 4 extremities, sensation grossly intact. Skin: Warm and dry, no rashes noted.  Respiratory: Not using accessory muscles, speaking in full sentences, trachea midline.  Cardiovascular: Pulses palpable, no extremity edema. Abdomen: Does not appear distended. MSK: C-spine normal-appearing normal cervical motion.  Within limits of range of motion appreciably strength is intact.   Reflexes and sensation are equal normal throughout. Right shoulder normal-appearing decreased motion to abduction external and internal rotation with pain.  Within limits of range of motion shoulder strength is intact abduction external and internal rotation. Hands slightly swollen second and third MCP first [place] bilateral  Lab and Radiology Results .DG Shoulder Right  Result Date: 12/23/2019 CLINICAL DATA:  Chronic pain. EXAM: RIGHT SHOULDER - 2+ VIEW COMPARISON:  None. FINDINGS: Marked degenerative changes are identified involving the glenohumeral joint. There is significant loss of joint space with extensive subchondral sclerosis and cystic change with marginal spur formation. Mild AC joint osteoarthritis noted. IMPRESSION: Marked glenohumeral joint osteoarthritis. Electronically Signed   By: Kerby Moors M.D.   On: 12/23/2019 14:04   EXAM: MRI CERVICAL SPINE WITHOUT CONTRAST  TECHNIQUE: Multiplanar, multisequence MR imaging of the cervical spine was performed. No intravenous contrast was administered.  COMPARISON:  2010  FINDINGS: Motion artifact is present.  Alignment: Non-specific straightening of the cervical lordosis.  Vertebrae: Vertebral body heights are maintained apart degenerative endplate irregularity and Schmorl's, progressed since 2010. Degenerative endplate marrow changes are present including minor edema at C4 and C5.  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  Posterior Fossa, vertebral arteries, paraspinal tissues: Interval posterior fossa decompression for Chiari malformation.  Disc levels:  C2-C3: Facet hypertrophy. No significant canal or foraminal stenosis.  C3-C4: Disc bulge, endplate osteophytes, and facet and uncovertebral hypertrophy. No significant canal or foraminal stenosis.  C4-C5: Disc bulge, endplate  osteophytes, and facet and uncovertebral hypertrophy. No significant canal stenosis. Mild  right and moderate left foraminal stenosis.  C5-C6: Disc bulge, endplate osteophytes, and facet and uncovertebral hypertrophy. No significant canal or foraminal stenosis.  C6-C7: Disc bulge, endplate osteophytes, and facet and uncovertebral hypertrophy. No significant canal or right foraminal stenosis. Moderate left foraminal stenosis.  C7-T1: Disc bulge and facet hypertrophy. No significant canal or foraminal stenosis.  IMPRESSION: Motion degraded study.  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  Multilevel degenerative changes with suboptimal stenosis evaluation due to artifact. No high-grade canal stenosis. Left foraminal stenosis at C4-C5 and C6-C7.   Electronically Signed   By: Macy Mis M.D.   On: 12/09/2018 13:43  I, Lynn Ray, personally (independently) visualized and performed the interpretation of the images attached in this note.  Procedure: Real-time Ultrasound Guided Injection of right shoulder glenohumeral joint posterior approach Device: Philips Affiniti 50G Images permanently stored and available for review in PACS Verbal informed consent obtained.  Discussed risks and benefits of procedure. Warned about infection bleeding damage to structures skin hypopigmentation and fat atrophy among others. Patient expresses understanding and agreement Time-out conducted.   Noted no overlying erythema, induration, or other signs of local infection.   Skin prepped in a sterile fashion.   Local anesthesia: Topical Ethyl chloride.   With sterile technique and under real time ultrasound guidance:  40 mg of Kenalog and 2 mL of Marcaine injected into joint. Fluid seen entering the joint capsule.   Completed without difficulty   Pain immediately resolved suggesting accurate placement of the  medication.   Advised to call if fevers/chills, erythema, induration, drainage, or persistent bleeding.   Images permanently stored and available for review in the ultrasound unit.  Impression: Technically successful ultrasound guided injection.        Impression and Recommendations:    Assessment and Plan: 85 y.o. female with multifactorial neck and shoulder pain.  Patient's pain in the shoulder and axilla area due to shoulder glenohumeral joint DJD.  She had great immediate pain response following diagnostic and therapeutic glenohumeral joint injection.  Hopefully she will have some good long-term response to the steroid component of this injection.  If she does not she should be a good candidate for total shoulder replacement but she had good immediate response.  Additionally she also has some pain in the lateral cervical spine area and it is trapezius on the right side.  This is likely mostly related to trapezius and scapular dysfunction and referred pain from her cervical spine degenerative changes.  I think this is most because she cannot move her shoulder normally and having some compensatory changes.  Hopefully she will have some better response to this with the shoulder injection.  She may be a candidate for physical therapy for this although she has had physical therapy in the past with only mild benefit.Marland Kitchen  PDMP not reviewed this encounter. Orders Placed This Encounter  Procedures  . [place] LIMITED JOINT SPACE STRUCTURES UP RIGHT(NO LINKED CHARGES)    Standing Status:   Future    Number of Occurrences:   1    Standing Expiration Date:   07/20/2020    Order Specific Question:   Reason for Exam (SYMPTOM  OR DIAGNOSIS REQUIRED)    Answer:   chronic shoulder pain    Order Specific Question:   Preferred imaging location?    Answer:   [facility]   No orders of the defined types were placed in this encounter.   Discussed warning signs  or symptoms. Please see  discharge instructions. Patient expresses understanding.   The above documentation has been reviewed and is accurate and complete Lynn Ray, M.D.

## 2020-01-20 ENCOUNTER — Encounter: Payer: Self-pay | Admitting: Family Medicine

## 2020-01-20 ENCOUNTER — Other Ambulatory Visit: Payer: Self-pay

## 2020-01-20 ENCOUNTER — Ambulatory Visit: Payer: Self-pay

## 2020-01-20 ENCOUNTER — Ambulatory Visit: Payer: Medicare PPO | Admitting: Family Medicine

## 2020-01-20 VITALS — BP 138/80 | HR 79 | Ht 59.0 in | Wt 207.0 lb

## 2020-01-20 DIAGNOSIS — G8929 Other chronic pain: Secondary | ICD-10-CM

## 2020-01-20 DIAGNOSIS — M25511 Pain in right shoulder: Secondary | ICD-10-CM

## 2020-01-20 DIAGNOSIS — M542 Cervicalgia: Secondary | ICD-10-CM | POA: Diagnosis not present

## 2020-01-20 NOTE — Patient Instructions (Addendum)
Thank you for coming in today.  Call or go to the ER if you develop a large red swollen joint with extreme pain or oozing puss.   Let me know if you want to do PT.   I think you should have a shoulder replacement if this shot does not last very long.   Heat will help the hand.   Modeling clay and Voltaren gel.  I can inject the joints in the hands any time.

## 2020-01-27 DIAGNOSIS — I129 Hypertensive chronic kidney disease with stage 1 through stage 4 chronic kidney disease, or unspecified chronic kidney disease: Secondary | ICD-10-CM | POA: Diagnosis not present

## 2020-01-27 DIAGNOSIS — N184 Chronic kidney disease, stage 4 (severe): Secondary | ICD-10-CM | POA: Diagnosis not present

## 2020-02-04 DIAGNOSIS — N184 Chronic kidney disease, stage 4 (severe): Secondary | ICD-10-CM | POA: Diagnosis not present

## 2020-02-04 DIAGNOSIS — I129 Hypertensive chronic kidney disease with stage 1 through stage 4 chronic kidney disease, or unspecified chronic kidney disease: Secondary | ICD-10-CM | POA: Diagnosis not present

## 2020-02-04 DIAGNOSIS — M9080 Osteopathy in diseases classified elsewhere, unspecified site: Secondary | ICD-10-CM | POA: Diagnosis not present

## 2020-02-04 DIAGNOSIS — Z6841 Body Mass Index (BMI) 40.0 and over, adult: Secondary | ICD-10-CM | POA: Diagnosis not present

## 2020-02-04 DIAGNOSIS — D631 Anemia in chronic kidney disease: Secondary | ICD-10-CM | POA: Diagnosis not present

## 2020-02-04 DIAGNOSIS — E889 Metabolic disorder, unspecified: Secondary | ICD-10-CM | POA: Diagnosis not present

## 2020-03-09 ENCOUNTER — Telehealth: Payer: Self-pay | Admitting: Internal Medicine

## 2020-03-09 ENCOUNTER — Ambulatory Visit: Payer: Medicare PPO | Admitting: Gastroenterology

## 2020-03-09 DIAGNOSIS — N184 Chronic kidney disease, stage 4 (severe): Secondary | ICD-10-CM

## 2020-03-09 NOTE — Telephone Encounter (Signed)
See below

## 2020-03-09 NOTE — Telephone Encounter (Signed)
Referral placed.

## 2020-03-09 NOTE — Telephone Encounter (Signed)
Patient calling wondering if she can get a second opinion from a different nephrologist. States since her kidney numbers have jumped she is very concerned and would like to see a different doctor because they aren't helping enough.

## 2020-03-09 NOTE — Telephone Encounter (Signed)
LVM letting the patient know that Dr. Sharlet Salina has placed a new referral for a nephrologist. Office number was provided in case the patient has additional concerns or questions.

## 2020-03-17 ENCOUNTER — Encounter: Payer: Self-pay | Admitting: Gastroenterology

## 2020-03-17 ENCOUNTER — Ambulatory Visit (INDEPENDENT_AMBULATORY_CARE_PROVIDER_SITE_OTHER): Payer: Medicare PPO | Admitting: Gastroenterology

## 2020-03-17 VITALS — BP 116/62 | HR 77 | Ht 59.5 in | Wt 202.0 lb

## 2020-03-17 DIAGNOSIS — K529 Noninfective gastroenteritis and colitis, unspecified: Secondary | ICD-10-CM | POA: Diagnosis not present

## 2020-03-17 DIAGNOSIS — Z85038 Personal history of other malignant neoplasm of large intestine: Secondary | ICD-10-CM

## 2020-03-17 MED ORDER — NA SULFATE-K SULFATE-MG SULF 17.5-3.13-1.6 GM/177ML PO SOLN: ORAL | 0 refills | Status: DC

## 2020-03-17 NOTE — Patient Instructions (Signed)
You have been scheduled for a colonoscopy. Please follow written instructions given to you at your visit today.  Please pick up your prep supplies at the pharmacy within the next 1-3 days. If you use inhalers (even only as needed), please bring them with you on the day of your procedure.   Due to recent changes in healthcare laws, you may see the results of your imaging and laboratory studies on MyChart before your provider has had a chance to review them.  We understand that in some cases there may be results that are confusing or concerning to you. Not all laboratory results come back in the same time frame and the provider may be waiting for multiple results in order to interpret others.  Please give us 48 hours in order for your provider to thoroughly review all the results before contacting the office for clarification of your results.   I appreciate the  opportunity to care for you  Thank You   Kavitha Nandigam , MD  

## 2020-03-17 NOTE — Progress Notes (Signed)
Lynn Ray    [id number]    [birth year]  Primary Care Physician:Crawford, Real Cons, MD  Referring Physician: Hoyt Koch, MD [address] [place],  [place] [address]   Chief complaint:  Change in bowel habits  HPI:  86 year old very pleasant female with history of morbid obesity, hypertension, COPD, GERD and colorectal cancer2004 here for follow-up visit with complaints of change in bowel habits  Complains of change in bowel habits with alternating constipation and diarrhea, has fecal urgency, also has excessive gas and flatulence.   She does not feel like she is completely evacuating when she tries to defecate.  She feels like something is obstructing.  She is worried about colon polyps or colon cancer. She does not drink milk as she is lactose intolerant but still eats cheese and ice cream intermittently. Denies any melena or blood per rectum. No recent change in weight or appetite.  Recently diagnosed with stage III CKD.  Colonoscopy November 10, 2015: Four sessile polyps removed, left-sided diverticulosis and internal hemorrhoids  No family history of colon cancer or GI malignancy.   Outpatient Encounter Medications as of 03/17/2020  Medication Sig  . cholecalciferol (VITAMIN D3) 25 MCG (1000 UT) tablet Take 1,000 Units by mouth daily.  . hydrochlorothiazide (HYDRODIURIL) 25 MG tablet TAKE 1 TABLET BY MOUTH EVERY DAY (Patient taking differently: 12 mg.)  . levothyroxine (SYNTHROID) 100 MCG tablet Take 1 tablet (100 mcg total) by mouth daily.  . metoprolol succinate (TOPROL-XL) 50 MG 24 hr tablet TAKE 1 TABLET BY MOUTH EVERY DAY  . vitamin B-12 (CYANOCOBALAMIN) 100 MCG tablet Take 100 mcg by mouth daily.  . [DISCONTINUED] diclofenac sodium (VOLTAREN) 1 % GEL 4 GRAM(S) FOUR TIMES DAILY, AS NEEDED   No facility-administered encounter medications on file as of 03/17/2020.    Allergies as of 03/17/2020 - Review Complete 03/17/2020  Allergen  Reaction Noted  . Aspirin Other (See Comments)   . Tape Rash 01/28/2014    Past Medical History:  Diagnosis Date  . Arthritis   . Cancer [facility]) 2004   rectal cancer  . Cataract    right eye  . Chiari malformation   . CKD (chronic kidney disease), stage IV ([place])   . Depression   . Diabetes mellitus ([place])    "pre"  . Hypertension   . Sleep apnea    no CPAP  . Thyroid disease    hyporthyroid    Past Surgical History:  Procedure Laterality Date  . CATARACT EXTRACTION Bilateral   . chiari malformation  2013   surgery  . COLONOSCOPY    . FLEXIBLE SIGMOIDOSCOPY    . POLYPECTOMY      Family History  Problem Relation Age of Onset  . Alzheimer's disease Mother   . Stroke Mother        mini  . Heart attack Mother   . Hypertension Mother   . Depression Other   . Anemia Other   . Obesity Other   . Colon cancer Neg Hx   . Esophageal cancer Neg Hx   . Stomach cancer Neg Hx     Social History   Socioeconomic History  . Marital status: Married    Spouse name: Eddie Dibbles  . Number of children: 2  . Years of education: Masters  . Highest education level: Not on file  Occupational History  . Occupation: retired    Comment: retired  Tobacco Use  . Smoking status:  Former Smoker    Packs/day: 0.25  . Smokeless tobacco: Never Used  . Tobacco comment: 04/2013,quit  Vaping Use  . Vaping Use: Never used  Substance and Sexual Activity  . Alcohol use: No    Alcohol/week: 0.0 standard drinks  . Drug use: No  . Sexual activity: Not Currently  Other Topics Concern  . Not on file  Social History Narrative   Patient consumes one cup of coffee daily   Social Determinants of Health   Financial Resource Strain: Not on file  Food Insecurity: Not on file  Transportation Needs: Not on file  Physical Activity: Not on file  Stress: Not on file  Social Connections: Not on file  Intimate Partner Violence: Not on file      Review of systems: All other review of systems negative  except as mentioned in the HPI.   Physical Exam: Vitals:   03/17/20 1410  BP: 116/62  Pulse: 77  SpO2: 98%   Body mass index is 40.12 kg/m. Gen:      No acute distress Neuro: alert and oriented x 3 Psych: normal mood and affect  Data Reviewed:  Reviewed labs, radiology imaging, old records and pertinent past GI work up   Assessment and Plan/Recommendations:  86 year old very pleasant female with complaints of change in bowel habits, difficulty evacuating.  She has history of colon cancer and multiple adenomatous polyps that were removed. She is having intermittent diarrhea and constipation. We will plan to proceed with colonoscopy for further evaluation, biopsies, exclude stricture at anastomosis or neoplastic lesion. The risks and benefits as well as alternatives of endoscopic procedure(s) have been discussed and reviewed. All questions answered. The patient agrees to proceed.     This visit required 40 minutes of patient care (this includes precharting, chart review, review of results, face-to-face time used for counseling as well as treatment plan and follow-up. The patient was provided an opportunity to ask questions and all were answered. The patient agreed with the plan and demonstrated an understanding of the instructions.  Damaris Hippo , MD    CC: Hoyt Koch, *

## 2020-04-05 DIAGNOSIS — N184 Chronic kidney disease, stage 4 (severe): Secondary | ICD-10-CM | POA: Diagnosis not present

## 2020-04-05 DIAGNOSIS — I129 Hypertensive chronic kidney disease with stage 1 through stage 4 chronic kidney disease, or unspecified chronic kidney disease: Secondary | ICD-10-CM | POA: Diagnosis not present

## 2020-04-06 ENCOUNTER — Encounter: Payer: Self-pay | Admitting: Gastroenterology

## 2020-04-15 DIAGNOSIS — Z8249 Family history of ischemic heart disease and other diseases of the circulatory system: Secondary | ICD-10-CM | POA: Diagnosis not present

## 2020-04-15 DIAGNOSIS — Z823 Family history of stroke: Secondary | ICD-10-CM | POA: Diagnosis not present

## 2020-04-15 DIAGNOSIS — M1990 Unspecified osteoarthritis, unspecified site: Secondary | ICD-10-CM | POA: Diagnosis not present

## 2020-04-15 DIAGNOSIS — I10 Essential (primary) hypertension: Secondary | ICD-10-CM | POA: Diagnosis not present

## 2020-04-15 DIAGNOSIS — Z6841 Body Mass Index (BMI) 40.0 and over, adult: Secondary | ICD-10-CM | POA: Diagnosis not present

## 2020-04-15 DIAGNOSIS — I739 Peripheral vascular disease, unspecified: Secondary | ICD-10-CM | POA: Diagnosis not present

## 2020-04-15 DIAGNOSIS — G8929 Other chronic pain: Secondary | ICD-10-CM | POA: Diagnosis not present

## 2020-04-15 DIAGNOSIS — E039 Hypothyroidism, unspecified: Secondary | ICD-10-CM | POA: Diagnosis not present

## 2020-04-28 ENCOUNTER — Telehealth: Payer: Self-pay | Admitting: Hematology

## 2020-04-28 NOTE — Progress Notes (Signed)
HEMATOLOGY/ONCOLOGY CONSULTATION NOTE  Date of Service: 04/29/2020  Patient Care Team: Hoyt Koch, MD as PCP - General (Internal Medicine)  CHIEF COMPLAINTS/PURPOSE OF CONSULTATION:  Positive M-Spike  HISTORY OF PRESENTING ILLNESS:   Lynn Ray is a wonderful 85 y.o. female who has been referred to [place] by Dr. Joelyn Oms at [facility] for evaluation and management of positive m-spike on SPEP.The pt reports that she is doing well overall. We are joined today by her husband.  The pt reports that these labs were done routinely as she was transferring care to a different doctor. She was previously seeing a doctor at [facility] prior to switching to Dr. Marylou Flesher. The pt notes that she started seeing a Nephrologist years ago but the numbers were very low. The first time she saw one was five years ago, but did not provide her with what the cause of her kidney disease was from. The pt notes that she did not receive a lot of information at [facility], one of the reasons she switched. The last few years, the pt has just been doing blood tests and not urine tests. The pt has had hypertension for a very long time, over ten years, and was diagnoses as pre-diabetic years ago as well. The pt has not been on any medications for diabetes. She is currently only on medication for hypothyroidism and hypertension. The pt notes that she had Grave's disease in the 1980s and had the radioactive iodine. The pt notes that she had surgery for rectal cancer in 2004, but did not need chemo or radiation. The pt has never had much protein in the urine, and her creatinine changed from 1.4 in 2016 to 1.8 just recently last month. The pt had an US Kidney that showed the right kidney was slightly smaller in size. The pt notes that Dr. Joelyn Oms believes the CKD is due to aging and chronic kidney disease and not needing immediate intervention.  Lab results 04/05/2020 of SPEP was all WNL except m-spike of 0.3.  04/05/2020 Free  Kappa of 69.2, Free Lambda of 26.7, Kappa Lambda ratio of 2.59.  On review of systems, pt reports anxiety and denies acute bone pains, fevers, chills, back pain, abdominal pain, and any other symptoms.  MEDICAL HISTORY:  Past Medical History:  Diagnosis Date  . Arthritis   . Cancer [facility]) 2004   rectal cancer  . Cataract    right eye  . Chiari malformation   . CKD (chronic kidney disease), stage IV ([place])   . Depression   . Diabetes mellitus ([place])    "pre"  . Hypertension   . Sleep apnea    no CPAP  . Thyroid disease    hyporthyroid    SURGICAL HISTORY: Past Surgical History:  Procedure Laterality Date  . CATARACT EXTRACTION Bilateral   . chiari malformation  2013   surgery  . COLONOSCOPY    . FLEXIBLE SIGMOIDOSCOPY    . POLYPECTOMY      SOCIAL HISTORY: Social History   Socioeconomic History  . Marital status: Married    Spouse name: Eddie Dibbles  . Number of children: 2  . Years of education: Masters  . Highest education level: Not on file  Occupational History  . Occupation: retired    Comment: retired  Tobacco Use  . Smoking status: Former Smoker    Packs/day: 0.25  . Smokeless tobacco: Never Used  . Tobacco comment: 04/2013,quit  Vaping Use  . Vaping Use: Never used  Substance and  Sexual Activity  . Alcohol use: No    Alcohol/week: 0.0 standard drinks  . Drug use: No  . Sexual activity: Not Currently  Other Topics Concern  . Not on file  Social History Narrative   Patient consumes one cup of coffee daily   Social Determinants of Health   Financial Resource Strain: Not on file  Food Insecurity: Not on file  Transportation Needs: Not on file  Physical Activity: Not on file  Stress: Not on file  Social Connections: Not on file  Intimate Partner Violence: Not on file    FAMILY HISTORY: Family History  Problem Relation Age of Onset  . Alzheimer's disease Mother   . Stroke Mother        mini  . Heart attack Mother   . Hypertension Mother   .  Depression Other   . Anemia Other   . Obesity Other   . Colon cancer Neg Hx   . Esophageal cancer Neg Hx   . Stomach cancer Neg Hx     ALLERGIES:  is allergic to aspirin and tape.  MEDICATIONS:  Current Outpatient Medications  Medication Sig Dispense Refill  . cholecalciferol (VITAMIN D3) 25 MCG (1000 UT) tablet Take 1,000 Units by mouth daily.    . hydrochlorothiazide (HYDRODIURIL) 25 MG tablet TAKE 1 TABLET BY MOUTH EVERY DAY (Patient taking differently: 12 mg.) 90 tablet 3  . levothyroxine (SYNTHROID) 100 MCG tablet Take 1 tablet (100 mcg total) by mouth daily. 90 tablet 3  . metoprolol succinate (TOPROL-XL) 50 MG 24 hr tablet TAKE 1 TABLET BY MOUTH EVERY DAY 90 tablet 3  . Na Sulfate-K Sulfate-Mg Sulf 17.5-3.13-1.6 GM/177ML SOLN 1 prep kit for colonoscopy 354 mL 0  . vitamin B-12 (CYANOCOBALAMIN) 100 MCG tablet Take 100 mcg by mouth daily.     No current facility-administered medications for this visit.    REVIEW OF SYSTEMS:   10 Point review of Systems was done is negative except as noted above.  PHYSICAL EXAMINATION: ECOG PERFORMANCE STATUS: 1 - Symptomatic but completely ambulatory  . Vitals:   04/29/20 1306 04/29/20 1309  BP: (!) 119/105 127/86  Pulse: 76   Resp: 17   Temp: (!) 97 F (36.1 C)   SpO2: 100%    Filed Weights   04/29/20 1306  Weight: 200 lb 12.8 oz (91.1 kg)   .Body mass index is 39.88 kg/m.  GENERAL:alert, in no acute distress and comfortable SKIN: no acute rashes, no significant lesions EYES: conjunctiva are pink and non-injected, sclera anicteric OROPHARYNX: MMM, no exudates, no oropharyngeal erythema or ulceration NECK: supple, no JVD LYMPH:  no palpable lymphadenopathy in the cervical, axillary or inguinal regions LUNGS: clear to auscultation b/l with normal respiratory effort HEART: regular rate & rhythm ABDOMEN:  normoactive bowel sounds , non tender, not distended. Extremity: no pedal edema PSYCH: alert & oriented x 3 with fluent  speech NEURO: no focal motor/sensory deficits  LABORATORY DATA:  I have reviewed the data as listed  . CBC Latest Ref Rng & Units 04/29/2020 07/17/2019 07/11/2017  WBC 4.0 - 10.5 K/uL 5.3 5.9 4.4  Hemoglobin 12.0 - 15.0 g/dL 10.9(L) 11.3(L) 12.0  Hematocrit 36.0 - 46.0 % 34.7(L) 34.8(L) 37.0  Platelets 150 - 400 K/uL 266 268.0 229.0    . CMP Latest Ref Rng & Units 04/29/2020 07/17/2019 07/11/2017  Glucose 70 - 99 mg/dL 91 90 101(H)  BUN 8 - 23 mg/dL 20 25(H) 28(H)  Creatinine 0.44 - 1.00 mg/dL 1.74(H) 2.02(H) 1.70(H)  Sodium 135 - 145 mmol/L 142 137 137  Potassium 3.5 - 5.1 mmol/L 4.0 3.9 3.9  Chloride 98 - 111 mmol/L 108 107 103  CO2 22 - 32 mmol/L '23 26 28  ' Calcium 8.9 - 10.3 mg/dL 9.4 9.7 9.8  Total Protein 6.5 - 8.1 g/dL 7.4 7.2 6.9  Total Bilirubin 0.3 - 1.2 mg/dL 0.3 0.2 0.3  Alkaline Phos 38 - 126 U/L 100 84 86  AST 15 - 41 U/L '16 22 23  ' ALT 0 - 44 U/L '12 17 20     ' RADIOGRAPHIC STUDIES: I have personally reviewed the radiological images as listed and agreed with the findings in the report. No results found.  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 -Advised pt that one blood test sent out showed that she had a m-spike of 0.3. Advised that we can see this evident in 3-4% population above 63 years old.  -Discussed amyloidosis and kidney enlargement. Advised pt this is highly unlikely that the abnormal protein is going to the cause for disorder of WBC or reason for chronic kidney disease. -Advised pt that if kidney was being affected my abnormal protein, the numbers would have gotten worse faster and leaked faster. The pt's kidney numbers have been relatively stable over six years. -Advised pt the goal for Nephrologist is to control blood pressure, watching for fluid overload, and making  sure no medications that affect the kidney. -Advised pt that if the numbers are changing over time and increasing, then that would warrant a PET scan and bone marrow biopsy. Only the further lab work and PET is needed at this time. -Discussed Multiple Myeloma and CRAB criteria: no hypercalcification, no anemia, no overt renal dysfunction and stable chronic kidney disease, and bone lesions unknown. No overt concern for this at this time. -Recommended pt start checking blood pressures first thing in morning prior to movement or coffee daily. Keep a log to help track progress. -Will get labs today to observe baseline labs and 24 hr UPEP. -Will get whole body skeletal survey in 2 weeks. -Will see back in 4 weeks via phone.    FOLLOW UP: Labs today Whole body skeletal survey in 2 weeks Phone visit with Dr Irene Limbo in 4 weeks   All of the patients questions were answered with apparent satisfaction. The patient knows to call the clinic with any problems, questions or concerns.  I spent 40 minutes counseling the patient face to face. The total time spent in the appointment was 45 minutes and more than 50% was on counseling and direct patient cares.  Sullivan Lone MD [place] AAHIVMS [facility] Hematology/Oncology Physician [facility]  (Office):       [phone] (Work cell):  [phone] (Fax):           [phone]  04/29/2020 2:10 PM  I, Reinaldo Raddle, am acting as scribe for Dr. Sullivan Lone, MD.   .I have reviewed the above documentation for accuracy and completeness, and I agree with the above. Brunetta Genera MD

## 2020-04-28 NOTE — Telephone Encounter (Signed)
Received a new hem referral from Dr. Joelyn Oms at [facility] for positive M spike on SPEP. Attempts to schedule the patient were made on 3/17 and 3/21 but I was unable to reach the Lynn Ray at the time. I cld her today and scheduled an appt for her to see Dr. Irene Limbo on 3/31 at 1pm. Lynn Ray aware to arrive 20 minutes early.

## 2020-04-29 ENCOUNTER — Inpatient Hospital Stay: Payer: Medicare PPO | Attending: Hematology | Admitting: Hematology

## 2020-04-29 ENCOUNTER — Inpatient Hospital Stay: Payer: Medicare PPO

## 2020-04-29 ENCOUNTER — Other Ambulatory Visit: Payer: Self-pay

## 2020-04-29 VITALS — BP 127/86 | HR 76 | Temp 97.0°F | Resp 17 | Ht 59.5 in | Wt 200.8 lb

## 2020-04-29 DIAGNOSIS — Z8249 Family history of ischemic heart disease and other diseases of the circulatory system: Secondary | ICD-10-CM | POA: Insufficient documentation

## 2020-04-29 DIAGNOSIS — Z85048 Personal history of other malignant neoplasm of rectum, rectosigmoid junction, and anus: Secondary | ICD-10-CM | POA: Insufficient documentation

## 2020-04-29 DIAGNOSIS — D472 Monoclonal gammopathy: Secondary | ICD-10-CM

## 2020-04-29 DIAGNOSIS — Z79899 Other long term (current) drug therapy: Secondary | ICD-10-CM | POA: Diagnosis not present

## 2020-04-29 DIAGNOSIS — N184 Chronic kidney disease, stage 4 (severe): Secondary | ICD-10-CM | POA: Insufficient documentation

## 2020-04-29 DIAGNOSIS — Z87891 Personal history of nicotine dependence: Secondary | ICD-10-CM | POA: Insufficient documentation

## 2020-04-29 DIAGNOSIS — Z8349 Family history of other endocrine, nutritional and metabolic diseases: Secondary | ICD-10-CM | POA: Insufficient documentation

## 2020-04-29 DIAGNOSIS — E039 Hypothyroidism, unspecified: Secondary | ICD-10-CM | POA: Insufficient documentation

## 2020-04-29 LAB — CBC WITH DIFFERENTIAL/PLATELET
Abs Immature Granulocytes: 0.01 10*3/uL (ref 0.00–0.07)
Basophils Absolute: 0.1 10*3/uL (ref 0.0–0.1)
Basophils Relative: 1 %
Eosinophils Absolute: 0.2 10*3/uL (ref 0.0–0.5)
Eosinophils Relative: 5 %
HCT: 34.7 % — ABNORMAL LOW (ref 36.0–46.0)
Hemoglobin: 10.9 g/dL — ABNORMAL LOW (ref 12.0–15.0)
Immature Granulocytes: 0 %
Lymphocytes Relative: 34 %
Lymphs Abs: 1.8 10*3/uL (ref 0.7–4.0)
MCH: 27.7 pg (ref 26.0–34.0)
MCHC: 31.4 g/dL (ref 30.0–36.0)
MCV: 88.3 fL (ref 80.0–100.0)
Monocytes Absolute: 0.4 10*3/uL (ref 0.1–1.0)
Monocytes Relative: 7 %
Neutro Abs: 2.8 10*3/uL (ref 1.7–7.7)
Neutrophils Relative %: 53 %
Platelets: 266 10*3/uL (ref 150–400)
RBC: 3.93 MIL/uL (ref 3.87–5.11)
RDW: 13 % (ref 11.5–15.5)
WBC: 5.3 10*3/uL (ref 4.0–10.5)
nRBC: 0 % (ref 0.0–0.2)

## 2020-04-29 LAB — IRON AND TIBC
Iron: 90 ug/dL (ref 41–142)
Saturation Ratios: 33 % (ref 21–57)
TIBC: 271 ug/dL (ref 236–444)
UIBC: 182 ug/dL (ref 120–384)

## 2020-04-29 LAB — CMP (CANCER CENTER ONLY)
ALT: 12 U/L (ref 0–44)
AST: 16 U/L (ref 15–41)
Albumin: 3.6 g/dL (ref 3.5–5.0)
Alkaline Phosphatase: 100 U/L (ref 38–126)
Anion gap: 11 (ref 5–15)
BUN: 20 mg/dL (ref 8–23)
CO2: 23 mmol/L (ref 22–32)
Calcium: 9.4 mg/dL (ref 8.9–10.3)
Chloride: 108 mmol/L (ref 98–111)
Creatinine: 1.74 mg/dL — ABNORMAL HIGH (ref 0.44–1.00)
GFR, Estimated: 29 mL/min — ABNORMAL LOW (ref >60)
Glucose, Bld: 91 mg/dL (ref 70–99)
Potassium: 4 mmol/L (ref 3.5–5.1)
Sodium: 142 mmol/L (ref 135–145)
Total Bilirubin: 0.3 mg/dL (ref 0.3–1.2)
Total Protein: 7.4 g/dL (ref 6.5–8.1)

## 2020-04-29 LAB — FERRITIN: Ferritin: 125 ng/mL (ref 11–307)

## 2020-04-29 LAB — SEDIMENTATION RATE: Sed Rate: 45 mm/hr — ABNORMAL HIGH (ref 0–22)

## 2020-04-29 NOTE — Patient Instructions (Signed)
Thank you for choosing [facility] to provide your oncology and hematology care.   Should you have questions after your visit to the [facility] ([facility]), please contact this office at [phone] between 8:30 AM and 4:30 PM.  Voice mails left after 4:00 PM may not be returned until the following business day.  Calls received after 4:30 PM will be answered by an off-site Nurse Triage Line.    Prescription Refills:  Please have your pharmacy contact us directly for most prescription requests.  Contact the office directly for refills of narcotics (pain medications). Allow 48-72 hours for refills.  Appointments: Please contact the [facility] scheduling department [phone] for questions regarding [facility] appointment scheduling.  Contact the schedulers with any scheduling changes so that your appointment can be rescheduled in a timely manner.   Central Scheduling for [place] [phone] - Call to schedule procedures such as PET scans, CT scans, MRI, Ultrasound, etc.  To afford each patient quality time with our providers, please arrive 30 minutes before your scheduled appointment time.  If you arrive late for your appointment, you may be asked to reschedule.  We strive to give you quality time with our providers, and arriving late affects you and other patients whose appointments are after yours. If you are a no show for multiple scheduled visits, you may be dismissed from the clinic at the providers discretion.     Resources: [facility] Social Workers [phone] for additional information on assistance programs or assistance connecting with community support programs   [place] DSS  336-641-3447: Information regarding food stamps, Medicaid, and utility assistance [place] Access [place] 336-333-6589   [place] [employer]'s shared-ride transportation service for eligible riders who have a disability that prevents them from riding the fixed route bus.   Medicare  Rights Center 800-333-4114 Helps people with Medicare understand their rights and benefits, navigate the Medicare system, and secure the quality healthcare they deserve [employer] 800-227-2345 Assists patients locate various types of support and financial assistance Cancer Care: 1-800-813-HOPE (4673) Provides financial assistance, online support groups, medication/co-pay assistance.   Transportation Assistance for appointments at [facility]: Transportation Coordinator [phone]  Again, thank you for choosing [facility] for your care.       

## 2020-04-30 ENCOUNTER — Telehealth: Payer: Self-pay | Admitting: Hematology

## 2020-04-30 LAB — KAPPA/LAMBDA LIGHT CHAINS
Kappa free light chain: 70.2 mg/L — ABNORMAL HIGH (ref 3.3–19.4)
Kappa, lambda light chain ratio: 2.79 — ABNORMAL HIGH (ref 0.26–1.65)
Lambda free light chains: 25.2 mg/L (ref 5.7–26.3)

## 2020-04-30 LAB — BETA 2 MICROGLOBULIN, SERUM: Beta-2 Microglobulin: 3.3 mg/L — ABNORMAL HIGH (ref 0.6–2.4)

## 2020-04-30 NOTE — Telephone Encounter (Signed)
Scheduled follow-up appointment per 3/31 los. Patient is aware. ?

## 2020-05-03 ENCOUNTER — Other Ambulatory Visit: Payer: Self-pay

## 2020-05-03 ENCOUNTER — Ambulatory Visit (AMBULATORY_SURGERY_CENTER): Payer: Medicare PPO | Admitting: Gastroenterology

## 2020-05-03 ENCOUNTER — Encounter: Payer: Self-pay | Admitting: Gastroenterology

## 2020-05-03 VITALS — BP 136/63 | HR 64 | Temp 96.6°F | Resp 21 | Ht 59.0 in | Wt 202.0 lb

## 2020-05-03 DIAGNOSIS — K529 Noninfective gastroenteritis and colitis, unspecified: Secondary | ICD-10-CM | POA: Diagnosis not present

## 2020-05-03 DIAGNOSIS — R197 Diarrhea, unspecified: Secondary | ICD-10-CM | POA: Diagnosis not present

## 2020-05-03 DIAGNOSIS — D123 Benign neoplasm of transverse colon: Secondary | ICD-10-CM | POA: Diagnosis not present

## 2020-05-03 DIAGNOSIS — K5730 Diverticulosis of large intestine without perforation or abscess without bleeding: Secondary | ICD-10-CM | POA: Diagnosis not present

## 2020-05-03 DIAGNOSIS — K648 Other hemorrhoids: Secondary | ICD-10-CM | POA: Diagnosis not present

## 2020-05-03 LAB — MULTIPLE MYELOMA PANEL, SERUM
Albumin SerPl Elph-Mcnc: 3.5 g/dL (ref 2.9–4.4)
Albumin/Glob SerPl: 1.1 (ref 0.7–1.7)
Alpha 1: 0.2 g/dL (ref 0.0–0.4)
Alpha2 Glob SerPl Elph-Mcnc: 0.8 g/dL (ref 0.4–1.0)
B-Globulin SerPl Elph-Mcnc: 1 g/dL (ref 0.7–1.3)
Gamma Glob SerPl Elph-Mcnc: 1.2 g/dL (ref 0.4–1.8)
Globulin, Total: 3.3 g/dL (ref 2.2–3.9)
IgA: 317 mg/dL (ref 64–422)
IgG (Immunoglobin G), Serum: 1387 mg/dL (ref 586–1602)
IgM (Immunoglobulin M), Srm: 36 mg/dL (ref 26–217)
M Protein SerPl Elph-Mcnc: 0.3 g/dL — ABNORMAL HIGH
Total Protein ELP: 6.8 g/dL (ref 6.0–8.5)

## 2020-05-03 MED ORDER — SODIUM CHLORIDE 0.9 % IV SOLN: 500.0000 mL | Freq: Once | INTRAVENOUS | Status: DC

## 2020-05-03 NOTE — Progress Notes (Signed)
A/ox3, pleased with MAC, report to RN 

## 2020-05-03 NOTE — Progress Notes (Signed)
Called to room to assist during endoscopic procedure.  Patient ID and intended procedure confirmed with present staff. Received instructions for my participation in the procedure from the performing physician.  

## 2020-05-03 NOTE — Op Note (Signed)
[place] Patient Name: Lynn Ray Procedure Date: 05/03/2020 2:46 PM MRN: [id number] Endoscopist: Mauri Pole , MD Age: 86 Referring MD:  Date of Birth: 1934-07-27 Gender: Female Account #: [id number] Procedure:                Colonoscopy Indications:              Clinically significant diarrhea of unexplained                            origin Medicines:                Monitored Anesthesia Care Procedure:                Pre-Anesthesia Assessment:                           - Prior to the procedure, a History and Physical                            was performed, and patient medications and                            allergies were reviewed. The patient's tolerance of                            previous anesthesia was also reviewed. The risks                            and benefits of the procedure and the sedation                            options and risks were discussed with the patient.                            All questions were answered, and informed consent                            was obtained. Prior Anticoagulants: The patient has                            taken no previous anticoagulant or antiplatelet                            agents. ASA Grade Assessment: III - A patient with                            severe systemic disease. After reviewing the risks                            and benefits, the patient was deemed in                            satisfactory condition to undergo the procedure.  After obtaining informed consent, the colonoscope                            was passed under direct vision. Throughout the                            procedure, the patient's blood pressure, pulse, and                            oxygen saturations were monitored continuously. The                            Olympus [id number]-[id number] (#[id number]) Colonoscope was                            introduced through the anus and advanced to the the                             cecum, identified by appendiceal orifice and                            ileocecal valve. The colonoscopy was performed                            without difficulty. The patient tolerated the                            procedure well. The quality of the bowel                            preparation was good. The ileocecal valve,                            appendiceal orifice, and rectum were photographed. Scope In: 2:58:02 PM Scope Out: 3:10:59 PM Scope Withdrawal Time: 0 hours 8 minutes 30 seconds  Total Procedure Duration: 0 hours 12 minutes 57 seconds  Findings:                 The perianal and digital rectal examinations were                            normal.                           A 4 mm polyp was found in the transverse colon. The                            polyp was sessile. The polyp was removed with a                            cold snare. Resection and retrieval were complete.                           Normal mucosa was found in the left colon and in  the right colon. Biopsies for histology were taken                            with a cold forceps from the right colon and left                            colon for evaluation of microscopic colitis.                           Scattered small and large-mouthed diverticula were                            found in the sigmoid colon and descending colon.                           A tattoo was seen in the rectum. A scar was found                            at the tattoo site.                           Non-bleeding external and internal hemorrhoids were                            found during retroflexion. The hemorrhoids were                            medium-sized. Complications:            No immediate complications. Estimated Blood Loss:     Estimated blood loss was minimal. Impression:               - One 4 mm polyp in the transverse colon, removed                            with  a cold snare. Resected and retrieved.                           - Normal mucosa in the left colon and in the right                            colon. Biopsied.                           - Moderate diverticulosis in the sigmoid colon and                            in the descending colon.                           - A tattoo was seen in the rectum. A scar was found                            at the tattoo site.                           -  Non-bleeding external and internal hemorrhoids. Recommendation:           - Patient has a contact number available for                            emergencies. The signs and symptoms of potential                            delayed complications were discussed with the                            patient. Return to normal activities tomorrow.                            Written discharge instructions were provided to the                            patient.                           - Resume previous diet.                           - Continue present medications.                           - Await pathology results.                           - No repeat colonoscopy due to age.                           - Return to GI office at the next available                            appointment in 2-4 weeks. Mauri Pole, MD 05/03/2020 3:22:34 PM This report has been signed electronically.

## 2020-05-03 NOTE — Progress Notes (Signed)
Pt reported her husband has dementia and her niece is also with them.  She said it was okay to bring both her husband and her niece back into the recovery room to hear the findings and discharge instructions.     No problems noted in the recovery room. maw

## 2020-05-03 NOTE — Patient Instructions (Addendum)
Handouts were given to your care partner on polyps, diverticulosis, and hemorrhoids. You may resume your current medications today. Await biopsy results.  May take 1-3 weeks to receive pathology results. Call to schedule an appointment with Dr. Silverio Decamp for 2-4 weeks. [phone] Please call if any questions or concerns.    YOU HAD AN ENDOSCOPIC PROCEDURE TODAY AT [facility]:   Refer to the procedure report that was given to you for any specific questions about what was found during the examination.  If the procedure report does not answer your questions, please call your gastroenterologist to clarify.  If you requested that your care partner not be given the details of your procedure findings, then the procedure report has been included in a sealed envelope for you to review at your convenience later.  YOU SHOULD EXPECT: Some feelings of bloating in the abdomen. Passage of more gas than usual.  Walking can help get rid of the air that was put into your GI tract during the procedure and reduce the bloating. If you had a lower endoscopy (such as a colonoscopy or flexible sigmoidoscopy) you may notice spotting of blood in your stool or on the toilet paper. If you underwent a bowel prep for your procedure, you may not have a normal bowel movement for a few days.  Please Note:  You might notice some irritation and congestion in your nose or some drainage.  This is from the oxygen used during your procedure.  There is no need for concern and it should clear up in a day or so.  SYMPTOMS TO REPORT IMMEDIATELY:   Following lower endoscopy (colonoscopy or flexible sigmoidoscopy):  Excessive amounts of blood in the stool  Significant tenderness or worsening of abdominal pains  Swelling of the abdomen that is new, acute  Fever of 100F or higher   For urgent or emergent issues, a gastroenterologist can be reached at any hour by calling [phone]. Do not use MyChart messaging  for urgent concerns.    DIET:  We do recommend a small meal at first, but then you may proceed to your regular diet.  Drink plenty of fluids but you should avoid alcoholic beverages for 24 hours.  ACTIVITY:  You should plan to take it easy for the rest of today and you should NOT DRIVE or use heavy machinery until tomorrow (because of the sedation medicines used during the test).    FOLLOW UP: Our staff will call the number listed on your records 48-72 hours following your procedure to check on you and address any questions or concerns that you may have regarding the information given to you following your procedure. If we do not reach you, we will leave a message.  We will attempt to reach you two times.  During this call, we will ask if you have developed any symptoms of COVID 19. If you develop any symptoms (ie: fever, flu-like symptoms, shortness of breath, cough etc.) before then, please call [phone].  If you test positive for Covid 19 in the 2 weeks post procedure, please call and report this information to [place].    If any biopsies were taken you will be contacted by phone or by letter within the next 1-3 weeks.  Please call us at [phone] if you have not heard about the biopsies in 3 weeks.    SIGNATURES/CONFIDENTIALITY: You and/or your care partner have signed paperwork which will be entered into your electronic medical record.  These signatures attest  to the fact that that the information above on your After Visit Summary has been reviewed and is understood.  Full responsibility of the confidentiality of this discharge information lies with you and/or your care-partner.

## 2020-05-03 NOTE — Progress Notes (Signed)
Pt's states no medical or surgical changes since previsit or office visit.  CW - vitals 

## 2020-05-04 ENCOUNTER — Encounter: Payer: Self-pay | Admitting: Gastroenterology

## 2020-05-05 ENCOUNTER — Telehealth: Payer: Self-pay

## 2020-05-05 ENCOUNTER — Encounter: Payer: Self-pay | Admitting: Gastroenterology

## 2020-05-05 NOTE — Telephone Encounter (Signed)
Called to schedule a follow up appointment w/APP. No answer, left message to call back.

## 2020-05-05 NOTE — Telephone Encounter (Signed)
LVM

## 2020-05-06 ENCOUNTER — Other Ambulatory Visit: Payer: Self-pay | Admitting: *Deleted

## 2020-05-06 DIAGNOSIS — D472 Monoclonal gammopathy: Secondary | ICD-10-CM

## 2020-05-10 ENCOUNTER — Ambulatory Visit
Admission: RE | Admit: 2020-05-10 | Discharge: 2020-05-10 | Disposition: A | Discharge status: Home / Self Care | Payer: Medicare PPO | Source: Ambulatory Visit | Attending: Hematology | Admitting: Hematology

## 2020-05-10 ENCOUNTER — Other Ambulatory Visit: Payer: Self-pay | Admitting: Nephrology

## 2020-05-10 ENCOUNTER — Other Ambulatory Visit: Payer: Self-pay

## 2020-05-10 DIAGNOSIS — D472 Monoclonal gammopathy: Secondary | ICD-10-CM | POA: Diagnosis not present

## 2020-05-11 LAB — UPEP/UIFE/LIGHT CHAINS/TP, 24-HR UR
% BETA, Urine: 25 %
ALPHA 1 URINE: 3.6 %
Albumin, U: 42.2 %
Alpha 2, Urine: 9.2 %
Free Kappa Lt Chains,Ur: 81.13 mg/L (ref 1.17–86.46)
Free Kappa/Lambda Ratio: 8.78 (ref 1.83–14.26)
Free Lambda Lt Chains,Ur: 9.24 mg/L (ref 0.27–15.21)
GAMMA GLOBULIN URINE: 20 %
Total Protein, Urine-Ur/day: 74 mg/24 hr (ref 30–150)
Total Protein, Urine: 11 mg/dL
Total Volume: 675

## 2020-05-11 NOTE — Telephone Encounter (Signed)
Patient returned call. Follow up scheduled 4/28 with Alonza Bogus

## 2020-05-25 ENCOUNTER — Encounter: Payer: Self-pay | Admitting: Gastroenterology

## 2020-05-27 ENCOUNTER — Ambulatory Visit: Payer: Medicare PPO | Admitting: Gastroenterology

## 2020-05-27 ENCOUNTER — Other Ambulatory Visit: Payer: Self-pay

## 2020-05-27 ENCOUNTER — Encounter: Payer: Self-pay | Admitting: Gastroenterology

## 2020-05-27 VITALS — BP 110/72 | HR 73 | Ht 59.0 in | Wt 199.8 lb

## 2020-05-27 DIAGNOSIS — R197 Diarrhea, unspecified: Secondary | ICD-10-CM

## 2020-05-27 NOTE — Progress Notes (Signed)
05/27/2020 Lynn Ray [id number] 1934/11/19   HISTORY OF PRESENT ILLNESS: This is an 86 year old female who is a patient of Dr. Woodward Ku.  She underwent colonoscopy earlier this month for evaluation of altered bowel habits with complaints of diarrhea.  Colonoscopy showed 1 small polyp that was removed and was a tubular adenoma, sigmoid and descending colon diverticulosis, hemorrhoids.  Random colon biopsies were negative for microscopic colitis.  She made this appointment today to review her colonoscopy results.  She says that she continues to have diarrhea with abdominal cramping.  Says that she has about 3 loose to watery bowel movements a day, usually about every time that she eats.  She does take a daily probiotic.  She is asking me about artificial sweeteners.  She says that she uses a lot of Splenda in her coffee, on her cereal, on her fruit.   Past Medical History:  Diagnosis Date  . Arthritis   . Cancer [facility]) 2004   rectal cancer  . Cataract    right eye  . Chiari malformation   . CKD (chronic kidney disease), stage IV ([place])   . Depression   . Diabetes mellitus ([place])    "pre"  . Hypertension   . Sleep apnea    no CPAP  . Thyroid disease    hyporthyroid   Past Surgical History:  Procedure Laterality Date  . CATARACT EXTRACTION Bilateral   . chiari malformation  2013   surgery  . COLONOSCOPY    . FLEXIBLE SIGMOIDOSCOPY    . POLYPECTOMY      reports that she has quit smoking. She smoked 0.25 packs per day. She has never used smokeless tobacco. She reports that she does not drink alcohol and does not use drugs. family history includes Alzheimer's disease in her mother; Anemia in an other family member; Depression in an other family member; Heart attack in her mother; Hypertension in her mother; Obesity in an other family member; Stroke in her mother. Allergies  Allergen Reactions  . Aspirin Other (See Comments)    Severe abdominal pain  . Tape Rash    adhesive       Outpatient Encounter Medications as of 05/27/2020  Medication Sig  . cholecalciferol (VITAMIN D3) 25 MCG (1000 UT) tablet Take 1,000 Units by mouth daily.  . hydrochlorothiazide (HYDRODIURIL) 25 MG tablet TAKE 1 TABLET BY MOUTH EVERY DAY (Patient taking differently: 12 mg.)  . levothyroxine (SYNTHROID) 100 MCG tablet Take 1 tablet (100 mcg total) by mouth daily.  . metoprolol succinate (TOPROL-XL) 50 MG 24 hr tablet TAKE 1 TABLET BY MOUTH EVERY DAY  . vitamin B-12 (CYANOCOBALAMIN) 100 MCG tablet Take 100 mcg by mouth daily.   No facility-administered encounter medications on file as of 05/27/2020.     REVIEW OF SYSTEMS  : All other systems reviewed and negative except where noted in the History of Present Illness.   PHYSICAL EXAM: BP 110/72   Pulse 73   Ht 4\' 11"  (1.499 m)   Wt 199 lb 12.8 oz (90.6 kg)   SpO2 99%   BMI 40.35 kg/m  General: Well developed AA female in no acute distress Head: Normocephalic and atraumatic Eyes:  Sclerae anicteric, conjunctiva pink. Ears: Normal auditory acuity Lungs: Clear throughout to auscultation; no W/R/R. Heart: Regular rate and rhythm; no M/R/G. Abdomen: Soft, non-distended.  BS present.  Minimal LUQ TTP. Musculoskeletal: Symmetrical with no gross deformities  Skin: No lesions on visible extremities Extremities: No edema  Neurological: Alert  oriented x 4, grossly non-focal Psychological:  Alert and cooperative. Normal mood and affect  ASSESSMENT AND PLAN: *Diarrhea: Biopsies during colonoscopy negative for microscopic colitis.  Colonoscopy otherwise showed no cause for her diarrhea.  After discussing with her further she is asking me about artificial sweeteners.  She admits to using a significant on a Splenda.  That certainly could be the cause of her symptoms.  She is going to decrease her intake of that.  We also discussed trying Benefiber 2 teaspoons mixed in 8 ounces of liquid daily and increasing to twice daily if tolerated to see  if that helps create bulk to her stool.  She is already on a probiotic and should continue that.  She will call back and 3 to 4 weeks with an update on her symptoms  CC:  Hoyt Koch, *

## 2020-05-27 NOTE — Patient Instructions (Signed)
If you are age 84 or older, your body mass index should be between 23-30. Your Body mass index is 40.35 kg/m. If this is out of the aforementioned range listed, please consider follow up with your Primary Care Provider.  If you are age 45 or younger, your body mass index should be between 19-25. Your Body mass index is 40.35 kg/m. If this is out of the aformentioned range listed, please consider follow up with your Primary Care Provider.   Start Benefiber  2 teaspoons in 8 ounces of liquid daily and may increase to twice daily if tolerated.   Decrease splenda use.  Call back in three weeks with an update and ask for [employer].  Thank you for choosing me and [facility].  Alonza Bogus, PA-C

## 2020-05-28 NOTE — Progress Notes (Signed)
Reviewed and agree with documentation and assessment and plan. K. Veena Rik Wadel , MD   

## 2020-05-29 NOTE — Progress Notes (Signed)
HEMATOLOGY/ONCOLOGY CONSULTATION NOTE  Date of Service: 05/31/2020  Patient Care Team: Hoyt Koch, MD as PCP - General (Internal Medicine)  CHIEF COMPLAINTS/PURPOSE OF CONSULTATION:  Positive M-Spike  HISTORY OF PRESENTING ILLNESS:   Lynn Ray is a wonderful 86 y.o. female who has been referred to [place] by Dr. Joelyn Oms at [facility] for evaluation and management of positive m-spike on SPEP.The pt reports that she is doing well overall. We are joined today by her husband.  The pt reports that these labs were done routinely as she was transferring care to a different doctor. She was previously seeing a doctor at [facility] prior to switching to Dr. Marylou Flesher. The pt notes that she started seeing a Nephrologist years ago but the numbers were very low. The first time she saw one was five years ago, but did not provide her with what the cause of her kidney disease was from. The pt notes that she did not receive a lot of information at [facility], one of the reasons she switched. The last few years, the pt has just been doing blood tests and not urine tests. The pt has had hypertension for a very long time, over ten years, and was diagnoses as pre-diabetic years ago as well. The pt has not been on any medications for diabetes. She is currently only on medication for hypothyroidism and hypertension. The pt notes that she had Grave's disease in the 1980s and had the radioactive iodine. The pt notes that she had surgery for rectal cancer in 2004, but did not need chemo or radiation. The pt has never had much protein in the urine, and her creatinine changed from 1.4 in 2016 to 1.8 just recently last month. The pt had an US Kidney that showed the right kidney was slightly smaller in size. The pt notes that Dr. Joelyn Oms believes the CKD is due to aging and chronic kidney disease and not needing immediate intervention.  Lab results 04/05/2020 of SPEP was all WNL except m-spike of 0.3.  04/05/2020 Free Kappa  of 69.2, Free Lambda of 26.7, Kappa Lambda ratio of 2.59.  On review of systems, pt reports anxiety and denies acute bone pains, fevers, chills, back pain, abdominal pain, and any other symptoms.  INTERVAL HISTORY I connected with Dolphus Jenny on 05/31/2020 by telephone and verified that I am speaking with the correct person using two identifiers.   I discussed the limitations of evaluation and management by telemedicine. The patient expressed understanding and agreed to proceed.   Other persons participating in the visit and their role in the encounter:                                                         - Reinaldo Raddle, Medical Scribe     Patient's location: Home Provider's location: [place] [facility] [place] is a wonderful 86 y.o. female who is here for f/u regarding evaluation and management of positive m-spike on SPEP. The patient's last visit with [place] was on 04/29/2020. The pt reports that she is doing well overall.  The pt reports that she has been in pain recently. The pt notes it travels down her body.  Of note since the patient's last visit, pt has had DG Bone Survey Met ([id number]) on 05/10/2020, which  revealed "1. No evidence of neoplastic disease to bone. Specifically, no lytic lesions to suggest multiple myeloma. 2. Right greater than left glenohumeral joint arthropathic changes."  Lab results today 04/29/2020 of CBC w/diff and CMP is as follows: all values are WNL except for Hgb of 10.9, HCT of 34.7, Creatinine of 1.74, GFR est of 29. 04/29/2020 Iron of 90, Sat Ratio of 33. 04/29/2020 Ferritin of 125. 04/29/2020 Beta 2 Microglobulin of 3.3. 04/29/2020 Sed Rate of 45. 04/29/2020 Kappa Free of 70.2, Kappa Lambda ratio of 2.79. 04/29/2020 MMP WNL except m-protein of 0.3.  05/06/2020 24-hr UPEP all WNL.  On review of systems, pt reports intermittent bodily pain and denies fevers, chills, night sweats, and any other symptoms.   MEDICAL HISTORY:  Past Medical  History:  Diagnosis Date  . Arthritis   . Cancer [facility]) 2004   rectal cancer  . Cataract    right eye  . Chiari malformation   . CKD (chronic kidney disease), stage IV ([place])   . Depression   . Diabetes mellitus ([place])    "pre"  . Hypertension   . Sleep apnea    no CPAP  . Thyroid disease    hyporthyroid    SURGICAL HISTORY: Past Surgical History:  Procedure Laterality Date  . CATARACT EXTRACTION Bilateral   . chiari malformation  2013   surgery  . COLONOSCOPY    . FLEXIBLE SIGMOIDOSCOPY    . POLYPECTOMY      SOCIAL HISTORY: Social History   Socioeconomic History  . Marital status: Married    Spouse name: Eddie Dibbles  . Number of children: 2  . Years of education: Masters  . Highest education level: Not on file  Occupational History  . Occupation: retired    Comment: retired  Tobacco Use  . Smoking status: Former Smoker    Packs/day: 0.25  . Smokeless tobacco: Never Used  . Tobacco comment: 04/2013,quit  Vaping Use  . Vaping Use: Never used  Substance and Sexual Activity  . Alcohol use: No    Alcohol/week: 0.0 standard drinks  . Drug use: No  . Sexual activity: Not Currently  Other Topics Concern  . Not on file  Social History Narrative   Patient consumes one cup of coffee daily   Social Determinants of Health   Financial Resource Strain: Not on file  Food Insecurity: Not on file  Transportation Needs: Not on file  Physical Activity: Not on file  Stress: Not on file  Social Connections: Not on file  Intimate Partner Violence: Not on file    FAMILY HISTORY: Family History  Problem Relation Age of Onset  . Alzheimer's disease Mother   . Stroke Mother        mini  . Heart attack Mother   . Hypertension Mother   . Depression Other   . Anemia Other   . Obesity Other   . Colon cancer Neg Hx   . Esophageal cancer Neg Hx   . Stomach cancer Neg Hx     ALLERGIES:  is allergic to aspirin and tape.  MEDICATIONS:  Current Outpatient Medications   Medication Sig Dispense Refill  . cholecalciferol (VITAMIN D3) 25 MCG (1000 UT) tablet Take 1,000 Units by mouth daily.    . hydrochlorothiazide (HYDRODIURIL) 25 MG tablet TAKE 1 TABLET BY MOUTH EVERY DAY (Patient taking differently: 12 mg.) 90 tablet 3  . levothyroxine (SYNTHROID) 100 MCG tablet Take 1 tablet (100 mcg total) by mouth daily. 90 tablet 3  . metoprolol  succinate (TOPROL-XL) 50 MG 24 hr tablet TAKE 1 TABLET BY MOUTH EVERY DAY 90 tablet 3  . vitamin B-12 (CYANOCOBALAMIN) 100 MCG tablet Take 100 mcg by mouth daily.     No current facility-administered medications for this visit.    REVIEW OF SYSTEMS:   10 Point review of Systems was done is negative except as noted above.  PHYSICAL EXAMINATION: ECOG PERFORMANCE STATUS: 1 - Symptomatic but completely ambulatory  . There were no vitals filed for this visit. There were no vitals filed for this visit. .There is no height or weight on file to calculate BMI.  Telehealth Visit.  LABORATORY DATA:  I have reviewed the data as listed  . CBC Latest Ref Rng & Units 04/29/2020 07/17/2019 07/11/2017  WBC 4.0 - 10.5 K/uL 5.3 5.9 4.4  Hemoglobin 12.0 - 15.0 g/dL 10.9(L) 11.3(L) 12.0  Hematocrit 36.0 - 46.0 % 34.7(L) 34.8(L) 37.0  Platelets 150 - 400 K/uL 266 268.0 229.0    . CMP Latest Ref Rng & Units 04/29/2020 07/17/2019 07/11/2017  Glucose 70 - 99 mg/dL 91 90 101(H)  BUN 8 - 23 mg/dL 20 25(H) 28(H)  Creatinine 0.44 - 1.00 mg/dL 1.74(H) 2.02(H) 1.70(H)  Sodium 135 - 145 mmol/L 142 137 137  Potassium 3.5 - 5.1 mmol/L 4.0 3.9 3.9  Chloride 98 - 111 mmol/L 108 107 103  CO2 22 - 32 mmol/L '23 26 28  ' Calcium 8.9 - 10.3 mg/dL 9.4 9.7 9.8  Total Protein 6.5 - 8.1 g/dL 7.4 7.2 6.9  Total Bilirubin 0.3 - 1.2 mg/dL 0.3 0.2 0.3  Alkaline Phos 38 - 126 U/L 100 84 86  AST 15 - 41 U/L '16 22 23  ' ALT 0 - 44 U/L '12 17 20     ' RADIOGRAPHIC STUDIES: I have personally reviewed the radiological images as listed and agreed with the findings in  the report. DG Bone Survey Met  Result Date: 05/12/2020 CLINICAL DATA:  staging monoclonal paraproteinemia EXAM: METASTATIC BONE SURVEY COMPARISON:  None. FINDINGS: There is mixed lucency and sclerosis within the right femoral head and anatomic neck with flattening of the femoral head, marked narrowing of the glenohumeral joint space, as well as milder subchondral sclerosis and cystic change along the apposing glenoid. Findings are consistent with advanced arthropathic change possibly secondary to old trauma or avascular necrosis. No convincing underlying bone lesion. Mild arthropathic changes are noted of the left glenohumeral joint with mild subchondral sclerosis and small inferior marginal osteophytes. There are no discrete lytic bone lesions to suggest multiple myeloma. There are no bone lesions to indicate other neoplastic disease. Disc degenerative changes are noted along the cervical, thoracic and lumbar spine. IMPRESSION: 1. No evidence of neoplastic disease to bone. Specifically, no lytic lesions to suggest multiple myeloma. 2. Right greater than left glenohumeral joint arthropathic changes. Electronically Signed   By: Lajean Manes M.D.   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 Beta-2 microglobulin elevated due to CKD. Overall labs stable, no overt acute concerns. -Discussed pt DG Bone Survey Met ([id number]) on 05/10/2020; no signs of bone tumors. Arthritic changes in right and left shoulder joints. -Discussed pt 24-hr UPEP; all normal. -Recommended pt discuss pain with PCP for potential steroid injections or referral to orthopaedics for management of arthritis. -Advised pt that this appears to be  MGUS at this time.  -Advised pt that there is a 1-2% risk of progression with MGUS each year. -Recommended  pt start a Vitamin B Complex or Multivitamin daily. -Will see back in 6 months with labs.    FOLLOW UP: RTC with Dr Irene Limbo with labs in 6 months Plz schedule these labs 1 week prior to clinic visit.   All of the patients questions were answered with apparent satisfaction. The patient knows to call the clinic with any problems, questions or concerns.  The total time spent in the appointment was 20 minutes and more than 50% was on counseling and direct patient cares.   Sullivan Lone MD [place] AAHIVMS [facility] Hematology/Oncology Physician [facility]  (Office):       [phone] (Work cell):  [phone] (Fax):           [phone]  05/31/2020 2:14 PM  I, Reinaldo Raddle, am acting as scribe for Dr. Sullivan Lone, MD.  .I have reviewed the above documentation for accuracy and completeness, and I agree with the above. Brunetta Genera MD

## 2020-05-31 ENCOUNTER — Inpatient Hospital Stay: Payer: Medicare PPO | Attending: Hematology | Admitting: Hematology

## 2020-05-31 DIAGNOSIS — D631 Anemia in chronic kidney disease: Secondary | ICD-10-CM | POA: Diagnosis not present

## 2020-05-31 DIAGNOSIS — Z79899 Other long term (current) drug therapy: Secondary | ICD-10-CM | POA: Insufficient documentation

## 2020-05-31 DIAGNOSIS — N189 Chronic kidney disease, unspecified: Secondary | ICD-10-CM | POA: Insufficient documentation

## 2020-05-31 DIAGNOSIS — D472 Monoclonal gammopathy: Secondary | ICD-10-CM | POA: Insufficient documentation

## 2020-05-31 DIAGNOSIS — I129 Hypertensive chronic kidney disease with stage 1 through stage 4 chronic kidney disease, or unspecified chronic kidney disease: Secondary | ICD-10-CM | POA: Insufficient documentation

## 2020-06-02 ENCOUNTER — Telehealth: Payer: Self-pay | Admitting: Hematology

## 2020-06-02 NOTE — Telephone Encounter (Signed)
Scheduled follow-up appointments per 5/2 los. Patient is aware. ?

## 2020-08-03 DIAGNOSIS — N184 Chronic kidney disease, stage 4 (severe): Secondary | ICD-10-CM | POA: Diagnosis not present

## 2020-08-09 DIAGNOSIS — D472 Monoclonal gammopathy: Secondary | ICD-10-CM | POA: Diagnosis not present

## 2020-08-09 DIAGNOSIS — N184 Chronic kidney disease, stage 4 (severe): Secondary | ICD-10-CM | POA: Diagnosis not present

## 2020-08-09 DIAGNOSIS — I129 Hypertensive chronic kidney disease with stage 1 through stage 4 chronic kidney disease, or unspecified chronic kidney disease: Secondary | ICD-10-CM | POA: Diagnosis not present

## 2020-09-07 DIAGNOSIS — Z961 Presence of intraocular lens: Secondary | ICD-10-CM | POA: Diagnosis not present

## 2020-09-07 DIAGNOSIS — E119 Type 2 diabetes mellitus without complications: Secondary | ICD-10-CM | POA: Diagnosis not present

## 2020-09-07 DIAGNOSIS — H04123 Dry eye syndrome of bilateral lacrimal glands: Secondary | ICD-10-CM | POA: Diagnosis not present

## 2020-09-07 DIAGNOSIS — H1045 Other chronic allergic conjunctivitis: Secondary | ICD-10-CM | POA: Diagnosis not present

## 2020-09-28 ENCOUNTER — Ambulatory Visit: Payer: Medicare PPO | Admitting: Internal Medicine

## 2020-09-28 ENCOUNTER — Encounter: Payer: Self-pay | Admitting: Internal Medicine

## 2020-09-28 ENCOUNTER — Other Ambulatory Visit: Payer: Self-pay

## 2020-09-28 VITALS — BP 102/80 | HR 61 | Temp 97.9°F | Resp 18 | Ht 59.0 in | Wt 195.4 lb

## 2020-09-28 DIAGNOSIS — G8929 Other chronic pain: Secondary | ICD-10-CM

## 2020-09-28 DIAGNOSIS — I10 Essential (primary) hypertension: Secondary | ICD-10-CM | POA: Diagnosis not present

## 2020-09-28 DIAGNOSIS — R195 Other fecal abnormalities: Secondary | ICD-10-CM | POA: Diagnosis not present

## 2020-09-28 DIAGNOSIS — M25511 Pain in right shoulder: Secondary | ICD-10-CM

## 2020-09-28 DIAGNOSIS — N184 Chronic kidney disease, stage 4 (severe): Secondary | ICD-10-CM | POA: Diagnosis not present

## 2020-09-28 DIAGNOSIS — R7303 Prediabetes: Secondary | ICD-10-CM | POA: Diagnosis not present

## 2020-09-28 DIAGNOSIS — E039 Hypothyroidism, unspecified: Secondary | ICD-10-CM | POA: Diagnosis not present

## 2020-09-28 LAB — CBC WITH DIFFERENTIAL/PLATELET
Basophils Absolute: 0.1 10*3/uL (ref 0.0–0.1)
Basophils Relative: 1 % (ref 0.0–3.0)
Eosinophils Absolute: 0.3 10*3/uL (ref 0.0–0.7)
Eosinophils Relative: 4.9 % (ref 0.0–5.0)
HCT: 34.2 % — ABNORMAL LOW (ref 36.0–46.0)
Hemoglobin: 11 g/dL — ABNORMAL LOW (ref 12.0–15.0)
Lymphocytes Relative: 37.6 % (ref 12.0–46.0)
Lymphs Abs: 2 10*3/uL (ref 0.7–4.0)
MCHC: 32.2 g/dL (ref 30.0–36.0)
MCV: 86.8 fl (ref 78.0–100.0)
Monocytes Absolute: 0.5 10*3/uL (ref 0.1–1.0)
Monocytes Relative: 9.5 % (ref 3.0–12.0)
Neutro Abs: 2.5 10*3/uL (ref 1.4–7.7)
Neutrophils Relative %: 47 % (ref 43.0–77.0)
Platelets: 262 10*3/uL (ref 150.0–400.0)
RBC: 3.94 Mil/uL (ref 3.87–5.11)
RDW: 13.9 % (ref 11.5–15.5)
WBC: 5.3 10*3/uL (ref 4.0–10.5)

## 2020-09-28 LAB — RENAL FUNCTION PANEL
Albumin: 3.8 g/dL (ref 3.5–5.2)
BUN: 23 mg/dL (ref 6–23)
CO2: 25 mEq/L (ref 19–32)
Calcium: 10 mg/dL (ref 8.4–10.5)
Chloride: 104 mEq/L (ref 96–112)
Creatinine, Ser: 1.91 mg/dL — ABNORMAL HIGH (ref 0.40–1.20)
GFR: 23.91 mL/min — ABNORMAL LOW (ref >60.00)
Glucose, Bld: 82 mg/dL (ref 70–99)
Phosphorus: 2.6 mg/dL (ref 2.3–4.6)
Potassium: 4.2 mEq/L (ref 3.5–5.1)
Sodium: 136 mEq/L (ref 135–145)

## 2020-09-28 LAB — HEMOGLOBIN A1C: Hgb A1c MFr Bld: 6.3 % (ref 4.6–6.5)

## 2020-09-28 LAB — T4, FREE: Free T4: 0.91 ng/dL (ref 0.60–1.60)

## 2020-09-28 LAB — TSH: TSH: 0.51 u[IU]/mL (ref 0.35–5.50)

## 2020-09-28 LAB — FERRITIN: Ferritin: 78.8 ng/mL (ref 10.0–291.0)

## 2020-09-28 MED ORDER — METHYLPREDNISOLONE ACETATE 40 MG/ML IJ SUSP
40.0000 mg | Freq: Once | INTRAMUSCULAR | Status: AC
  Administered 2020-09-28: 40 mg via INTRAMUSCULAR

## 2020-09-28 NOTE — Progress Notes (Signed)
   Subjective:   Patient ID: Lynn Ray, female    DOB: 08-21-1934, 86 y.o.   MRN: [id number]  HPI The patient is an 86 YO female coming in for headache and dark stool and follow up medical conditions.   Review of Systems  Constitutional: Negative.   HENT: Negative.    Eyes: Negative.   Respiratory:  Negative for cough, chest tightness and shortness of breath.   Cardiovascular:  Negative for chest pain, palpitations and leg swelling.  Gastrointestinal:  Positive for blood in stool. Negative for abdominal distention, abdominal pain, constipation, diarrhea, nausea and vomiting.  Musculoskeletal:  Positive for arthralgias.  Skin: Negative.   Neurological:  Positive for headaches.  Psychiatric/Behavioral: Negative.     Objective:  Physical Exam Constitutional:      Appearance: She is well-developed. She is obese.  HENT:     Head: Normocephalic and atraumatic.     Right Ear: Tympanic membrane normal.     Left Ear: Tympanic membrane normal.  Cardiovascular:     Rate and Rhythm: Normal rate and regular rhythm.  Pulmonary:     Effort: Pulmonary effort is normal. No respiratory distress.     Breath sounds: Normal breath sounds. No wheezing or rales.  Abdominal:     General: Bowel sounds are normal. There is no distension.     Palpations: Abdomen is soft.     Tenderness: There is no abdominal tenderness. There is no rebound.  Musculoskeletal:        General: Tenderness present.     Cervical back: Normal range of motion.     Comments: Pain in the right shoulder and muscle tightness into the right neck region  Skin:    General: Skin is warm and dry.  Neurological:     Mental Status: She is alert and oriented to person, place, and time.     Coordination: Coordination normal.    Vitals:   09/28/20 1345  BP: 102/80  Pulse: 61  Resp: 18  Temp: 97.9 F (36.6 C)  TempSrc: Oral  SpO2: 99%  Weight: 195 lb 6.4 oz (88.6 kg)  Height: 4\' 11"  (1.499 m)    This visit occurred  during the SARS-CoV-2 public health emergency.  Safety protocols were in place, including screening questions prior to the visit, additional usage of staff PPE, and extensive cleaning of exam room while observing appropriate contact time as indicated for disinfecting solutions.   Assessment & Plan:  Depo-medrol 40 mg IM given at visit

## 2020-09-28 NOTE — Patient Instructions (Addendum)
We will check the labs today.  We have given you a shot today which might help the headaches.

## 2020-09-29 DIAGNOSIS — R7303 Prediabetes: Secondary | ICD-10-CM | POA: Insufficient documentation

## 2020-09-29 NOTE — Assessment & Plan Note (Signed)
BP at goal today.

## 2020-09-29 NOTE — Assessment & Plan Note (Signed)
Given depo-medrol 40 mg IM at visit.

## 2020-09-29 NOTE — Assessment & Plan Note (Addendum)
Checking CBC and ferritin to assess. She had taken stomach medication for diarrhea so may have been related to that. Recent colonoscopy with diverticulosis so could be some diverticular bleeding.

## 2020-09-29 NOTE — Assessment & Plan Note (Signed)
Needs follow up as no HgA1c in some time. Checking HgA1c.

## 2020-09-29 NOTE — Assessment & Plan Note (Signed)
Checking TSH and free T4 and adjust synthroid 100 mcg daily as needed. No symptoms of over or under replacement.

## 2020-09-29 NOTE — Assessment & Plan Note (Signed)
Checking GFR today. She is having some headache but without high BP. Checking HgA1c as prior in pre-diabetes range.

## 2020-10-22 ENCOUNTER — Ambulatory Visit (INDEPENDENT_AMBULATORY_CARE_PROVIDER_SITE_OTHER): Payer: Medicare PPO

## 2020-10-22 ENCOUNTER — Other Ambulatory Visit: Payer: Self-pay

## 2020-10-22 VITALS — BP 160/80 | HR 75 | Temp 98.1°F | Ht 59.0 in | Wt 195.6 lb

## 2020-10-22 DIAGNOSIS — Z0000 Encounter for general adult medical examination without abnormal findings: Secondary | ICD-10-CM | POA: Diagnosis not present

## 2020-10-22 NOTE — Progress Notes (Signed)
Subjective:   Lynn Ray is a 86 y.o. female who presents for Medicare Annual (Subsequent) preventive examination.  Review of Systems     Cardiac Risk Factors include: advanced age (>30men, >65 women);family history of premature cardiovascular disease;hypertension;obesity (BMI >30kg/m2);sedentary lifestyle     Objective:    Today's Vitals   10/22/20 1305  BP: (!) 160/80  Pulse: 75  Temp: 98.1 F (36.7 C)  SpO2: 96%  Weight: 195 lb 9.6 oz (88.7 kg)  Height: 4\' 11"  (1.499 m)  PainSc: 0-No pain   Body mass index is 39.51 kg/m.  Advanced Directives 10/22/2020 07/04/2019 07/01/2018 06/18/2017 11/10/2015 10/27/2015 09/12/2014  Does Patient Have a Medical Advance Directive? Yes Yes Yes Yes Yes Yes No  Type of Advance Directive Living will;Healthcare Power of Attorney Living will;Healthcare Power of [place];Living will [place];Living will [place];Living will [place];Living will -  Does patient want to make changes to medical advance directive? No - Patient declined No - Patient declined - - - - -  Copy of [place] in Chart? No - copy requested No - copy requested No - copy requested No - copy requested - - -    Current Medications (verified) Outpatient Encounter Medications as of 10/22/2020  Medication Sig   cholecalciferol (VITAMIN D3) 25 MCG (1000 UT) tablet Take 1,000 Units by mouth daily.   hydrochlorothiazide (HYDRODIURIL) 25 MG tablet TAKE 1 TABLET BY MOUTH EVERY DAY (Patient taking differently: Take 12.5 mg by mouth daily.)   levothyroxine (SYNTHROID) 100 MCG tablet Take 1 tablet (100 mcg total) by mouth daily.   metoprolol succinate (TOPROL-XL) 50 MG 24 hr tablet TAKE 1 TABLET BY MOUTH EVERY DAY   vitamin B-12 (CYANOCOBALAMIN) 100 MCG tablet Take 100 mcg by mouth daily.   No facility-administered encounter medications on file as of 10/22/2020.    Allergies  (verified) Aspirin and Tape   History: Past Medical History:  Diagnosis Date   Arthritis    Cancer ([place]) 2004   rectal cancer   Cataract    right eye   Chiari malformation    CKD (chronic kidney disease), stage IV ([place])    Depression    Diabetes mellitus ([place])    "pre"   Hypertension    Sleep apnea    no CPAP   Thyroid disease    hyporthyroid   Past Surgical History:  Procedure Laterality Date   CATARACT EXTRACTION Bilateral    chiari malformation  2013   surgery   COLONOSCOPY     FLEXIBLE SIGMOIDOSCOPY     POLYPECTOMY     Family History  Problem Relation Age of Onset   Alzheimer's disease Mother    Stroke Mother        mini   Heart attack Mother    Hypertension Mother    Depression Other    Anemia Other    Obesity Other    Colon cancer Neg Hx    Esophageal cancer Neg Hx    Stomach cancer Neg Hx    Social History   Socioeconomic History   Marital status: Married    Spouse name: Eddie Dibbles   Number of children: 2   Years of education: Masters   Highest education level: Not on file  Occupational History   Occupation: retired    Comment: retired  Tobacco Use   Smoking status: Former    Packs/day: 0.25    Types: Cigarettes  Smokeless tobacco: Never   Tobacco comments:    04/2013,quit  Vaping Use   Vaping Use: Never used  Substance and Sexual Activity   Alcohol use: No    Alcohol/week: 0.0 standard drinks   Drug use: No   Sexual activity: Not Currently  Other Topics Concern   Not on file  Social History Narrative   Patient consumes one cup of coffee daily   Social Determinants of Health   Financial Resource Strain: Low Risk    Difficulty of Paying Living Expenses: Not hard at all  Food Insecurity: No Food Insecurity   Worried About Charity fundraiser in the Last Year: Never true   Ran Out of Food in the Last Year: Never true  Transportation Needs: No Transportation Needs   Lack of Transportation (Medical): No   Lack of Transportation  (Non-Medical): No  Physical Activity: Inactive   Days of Exercise per Week: 0 days   Minutes of Exercise per Session: 0 min  Stress: No Stress Concern Present   Feeling of Stress : Not at all  Social Connections: Socially Integrated   Frequency of Communication with Friends and Family: More than three times a week   Frequency of Social Gatherings with Friends and Family: Once a week   Attends Religious Services: More than 4 times per year   Active Member of [employer] or Organizations: Yes   Attends Music therapist: More than 4 times per year   Marital Status: Married    Tobacco Counseling Counseling given: Not Answered Tobacco comments: 04/2013,quit   Clinical Intake:  Pre-visit preparation completed: Yes  Pain : No/denies pain Pain Score: 0-No pain     BMI - recorded: 39.51 Nutritional Status: BMI > 30  Obese Nutritional Risks: None Diabetes: No  How often do you need to have someone help you when you read instructions, pamphlets, or other written materials from your doctor or pharmacy?: 1 - Never What is the last grade level you completed in school?: Master's Degree  Diabetic? Pre-diabetes  Interpreter Needed?: No  Information entered by :: Lisette Abu, LPN   Activities of Daily Living In your present state of health, do you have any difficulty performing the following activities: 10/22/2020 09/28/2020  Hearing? Y N  Vision? N N  Difficulty concentrating or making decisions? N N  Walking or climbing stairs? N N  Dressing or bathing? N N  Doing errands, shopping? N N  Preparing Food and eating ? N -  Using the Toilet? N -  In the past six months, have you accidently leaked urine? N -  Do you have problems with loss of bowel control? N -  Managing your Medications? N -  Managing your Finances? N -  Housekeeping or managing your Housekeeping? N -  Some recent data might be hidden    Patient Care Team: Hoyt Koch, MD as PCP -  General (Internal Medicine) Clent Jacks, MD as Consulting Physician (Ophthalmology)  Indicate any recent Medical Services you may have received from other than [facility] providers in the past year (date may be approximate).     Assessment:   This is a routine wellness examination for Lynn Ray.  Hearing/Vision screen Hearing Screening - Comments:: Patient has some hearing loss.  Hearing aids: Yes; does not wear them. Vision Screening - Comments:: Patient wears corrective glasses/contacts.  Eye exam done annually by: Clent Jacks, MD.  Dietary issues and exercise activities discussed: Current Exercise Habits: The patient does not participate in  regular exercise at present, Exercise limited by: None identified   Goals Addressed   None   Depression Screen PHQ 2/9 Scores 10/22/2020 09/28/2020 07/04/2019 07/01/2018 06/18/2017 04/16/2014  PHQ - 2 Score 0 0 1 1 1 1   PHQ- 9 Score - - - 5 4 -    Fall Risk Fall Risk  10/22/2020 09/28/2020 07/04/2019 07/01/2018 06/18/2017  Falls in the past year? 0 0 1 0 No  Number falls in past yr: 0 0 1 0 -  Injury with Fall? 0 0 1 - -  Comment - - hit her head; was a bruise; did not go to ER - -  Risk for fall due to : No Fall Risks - History of fall(s);Impaired balance/gait;Impaired mobility Impaired balance/gait;Impaired mobility -  Follow up Falls evaluation completed - Falls evaluation completed;Education provided;Falls prevention discussed Falls prevention discussed -    FALL RISK PREVENTION PERTAINING TO THE HOME:  Any stairs in or around the home? Yes  If so, are there any without handrails? No  Home free of loose throw rugs in walkways, pet beds, electrical cords, etc? Yes  Adequate lighting in your home to reduce risk of falls? Yes   ASSISTIVE DEVICES UTILIZED TO PREVENT FALLS:  Life alert? No  Use of a cane, walker or w/c? No  Grab bars in the bathroom? Yes  Shower chair or bench in shower? No  Elevated toilet seat or a handicapped toilet? Yes   TIMED  UP AND GO:  Was the test performed? Yes .  Length of time to ambulate 10 feet: 9 sec.   Gait slow and steady without use of assistive device  Cognitive Function: MMSE - Mini Mental State Exam 06/18/2017  Orientation to time 5  Orientation to Place 5  Registration 3  Attention/ Calculation 4  Recall 3  Language- name 2 objects 2  Language- repeat 1  Language- follow 3 step command 3  Language- read & follow direction 1  Write a sentence 1  Copy design 1  Total score 29        Immunizations Immunization History  Administered Date(s) Administered   Fluad Quad(high Dose 65+) 11/20/2018, 12/22/2019   Influenza, High Dose Seasonal PF 12/26/2016, 10/08/2017, 11/20/2018   Influenza-Unspecified 10/30/2013   PFIZER(Purple Top)SARS-COV-2 Vaccination 02/21/2019, 03/14/2019   Pneumococcal Conjugate-13 03/06/2016   Pneumococcal Polysaccharide-23 06/18/2017   Tdap 07/11/2017   Zoster Recombinat (Shingrix) 01/01/2019, 04/29/2019    TDAP status: Up to date  Flu Vaccine status: Up to date  Pneumococcal vaccine status: Up to date  Covid-19 vaccine status: Completed vaccines  Qualifies for Shingles Vaccine? Yes   Zostavax completed Yes   Shingrix Completed?: Yes  Screening Tests Health Maintenance  Topic Date Due   DEXA SCAN  Never done   COVID-19 Vaccine (3 - Booster for Pfizer series) 08/11/2019   INFLUENZA VACCINE  04/29/2021 (Originally 08/30/2020)   COLONOSCOPY (Pts 45-21yrs Insurance coverage will need to be confirmed)  05/04/2023   TETANUS/TDAP  07/12/2027   Zoster Vaccines- Shingrix  Completed   HPV VACCINES  Aged Out    Health Maintenance  Health Maintenance Due  Topic Date Due   DEXA SCAN  Never done   COVID-19 Vaccine (3 - Booster for [place] series) 08/11/2019    Colorectal cancer screening: Type of screening: Colonoscopy. Completed 05/03/2020. Repeat every 3 years  Mammogram status: No longer required due to age.  Bone density: never done  Lung Cancer  Screening: (Low Dose CT Chest recommended if Age 34-80  years, 30 pack-year currently smoking OR have quit w/in 15years.) does not qualify.   Lung Cancer Screening Referral: no  Additional Screening:  Hepatitis C Screening: does not qualify; Completed: no  Vision Screening: Recommended annual ophthalmology exams for early detection of glaucoma and other disorders of the eye. Is the patient up to date with their annual eye exam?  Yes  Who is the provider or what is the name of the office in which the patient attends annual eye exams? Clent Jacks, MD. If pt is not established with a provider, would they like to be referred to a provider to establish care? No .   Dental Screening: Recommended annual dental exams for proper oral hygiene  Community Resource Referral / Chronic Care Management: CRR required this visit?  No   CCM required this visit?  No      Plan:     I have personally reviewed and noted the following in the patient's chart:   Medical and social history Use of alcohol, tobacco or illicit drugs  Current medications and supplements including opioid prescriptions.  Functional ability and status Nutritional status Physical activity Advanced directives List of other physicians Hospitalizations, surgeries, and ER visits in previous 12 months Vitals Screenings to include cognitive, depression, and falls Referrals and appointments  In addition, I have reviewed and discussed with patient certain preventive protocols, quality metrics, and best practice recommendations. A written personalized care plan for preventive services as well as general preventive health recommendations were provided to patient.     Sheral Flow, LPN   6/80/8811   Nurse Notes:  Hearing Screening - Comments:: Patient has some hearing loss.  Hearing aids: Yes; does not wear them. Vision Screening - Comments:: Patient wears corrective glasses/contacts.  Eye exam done annually by: Clent Jacks, MD.

## 2020-10-22 NOTE — Patient Instructions (Signed)
[name] , Thank you for taking time to come for your Medicare Wellness Visit. I appreciate your ongoing commitment to your health goals. Please review the following plan we discussed and let me know if I can assist you in the future.   Screening recommendations/referrals: Colonoscopy: 05/03/2020; due every 3 years Mammogram: 06/19/2016 Bone Density: 05/14/2013 Recommended yearly ophthalmology/optometry visit for glaucoma screening and checkup Recommended yearly dental visit for hygiene and checkup  Vaccinations: Influenza vaccine: 12/22/2019 Pneumococcal vaccine: 03/06/2016, 06/18/2017 Tdap vaccine: 07/11/2017; due every 10 years Shingles vaccine: 01/01/2019, 04/19/2019   Covid-19: 1/22/22021, 03/14/2019  Advanced directives: Please bring a copy of your health care power of attorney and living will to the office at your convenience.  Conditions/risks identified: Yes; Client understands the importance of follow-up with providers by attending scheduled visits and discussed goals to eat healthier, increase physical activity, exercise the brain, socialize more, get enough sleep and make time for laughter.  Next appointment: Please schedule your next Medicare Wellness Visit with your Nurse Health Advisor in 1 year by calling [phone].   Preventive Care 64 Years and Older, Female Preventive care refers to lifestyle choices and visits with your health care provider that can promote health and wellness. What does preventive care include? A yearly physical exam. This is also called an annual well check. Dental exams once or twice a year. Routine eye exams. Ask your health care provider how often you should have your eyes checked. Personal lifestyle choices, including: Daily care of your teeth and gums. Regular physical activity. Eating a healthy diet. Avoiding tobacco and drug use. Limiting alcohol use. Practicing safe sex. Taking low-dose aspirin every day. Taking vitamin and mineral  supplements as recommended by your health care provider. What happens during an annual well check? The services and screenings done by your health care provider during your annual well check will depend on your age, overall health, lifestyle risk factors, and family history of disease. Counseling  Your health care provider may ask you questions about your: Alcohol use. Tobacco use. Drug use. Emotional well-being. Home and relationship well-being. Sexual activity. Eating habits. History of falls. Memory and ability to understand (cognition). Work and work Statistician. Reproductive health. Screening  You may have the following tests or measurements: Height, weight, and BMI. Blood pressure. Lipid and cholesterol levels. These may be checked every 5 years, or more frequently if you are over 66 years old. Skin check. Lung cancer screening. You may have this screening every year starting at age 21 if you have a 30-pack-year history of smoking and currently smoke or have quit within the past 15 years. Fecal occult blood test (FOBT) of the stool. You may have this test every year starting at age 54. Flexible sigmoidoscopy or colonoscopy. You may have a sigmoidoscopy every 5 years or a colonoscopy every 10 years starting at age 43. Hepatitis C blood test. Hepatitis B blood test. Sexually transmitted disease (STD) testing. Diabetes screening. This is done by checking your blood sugar (glucose) after you have not eaten for a while (fasting). You may have this done every 1-3 years. Bone density scan. This is done to screen for osteoporosis. You may have this done starting at age 47. Mammogram. This may be done every 1-2 years. Talk to your health care provider about how often you should have regular mammograms. Talk with your health care provider about your test results, treatment options, and if necessary, the need for more tests. Vaccines  Your health care provider may recommend  certain  vaccines, such as: Influenza vaccine. This is recommended every year. Tetanus, diphtheria, and acellular pertussis (Tdap, Td) vaccine. You may need a Td booster every 10 years. Zoster vaccine. You may need this after age 41. Pneumococcal 13-valent conjugate (PCV13) vaccine. One dose is recommended after age 59. Pneumococcal polysaccharide (PPSV23) vaccine. One dose is recommended after age 72. Talk to your health care provider about which screenings and vaccines you need and how often you need them. This information is not intended to replace advice given to you by your health care provider. Make sure you discuss any questions you have with your health care provider. Document Released: 02/12/2015 Document Revised: 10/06/2015 Document Reviewed: 11/17/2014 Elsevier Interactive Patient Education  2017 [place] Prevention in the Home Falls can cause injuries. They can happen to people of all ages. There are many things you can do to make your home safe and to help prevent falls. What can I do on the outside of my home? Regularly fix the edges of walkways and driveways and fix any cracks. Remove anything that might make you trip as you walk through a door, such as a raised step or threshold. Trim any bushes or trees on the path to your home. Use bright outdoor lighting. Clear any walking paths of anything that might make someone trip, such as rocks or tools. Regularly check to see if handrails are loose or broken. Make sure that both sides of any steps have handrails. Any raised decks and porches should have guardrails on the edges. Have any leaves, snow, or ice cleared regularly. Use sand or salt on walking paths during winter. Clean up any spills in your garage right away. This includes oil or grease spills. What can I do in the bathroom? Use night lights. Install grab bars by the toilet and in the tub and shower. Do not use towel bars as grab bars. Use non-skid mats or decals in  the tub or shower. If you need to sit down in the shower, use a plastic, non-slip stool. Keep the floor dry. Clean up any water that spills on the floor as soon as it happens. Remove soap buildup in the tub or shower regularly. Attach bath mats securely with double-sided non-slip rug tape. Do not have throw rugs and other things on the floor that can make you trip. What can I do in the bedroom? Use night lights. Make sure that you have a light by your bed that is easy to reach. Do not use any sheets or blankets that are too big for your bed. They should not hang down onto the floor. Have a firm chair that has side arms. You can use this for support while you get dressed. Do not have throw rugs and other things on the floor that can make you trip. What can I do in the kitchen? Clean up any spills right away. Avoid walking on wet floors. Keep items that you use a lot in easy-to-reach places. If you need to reach something above you, use a strong step stool that has a grab bar. Keep electrical cords out of the way. Do not use floor polish or wax that makes floors slippery. If you must use wax, use non-skid floor wax. Do not have throw rugs and other things on the floor that can make you trip. What can I do with my stairs? Do not leave any items on the stairs. Make sure that there are handrails on both sides of the  stairs and use them. Fix handrails that are broken or loose. Make sure that handrails are as long as the stairways. Check any carpeting to make sure that it is firmly attached to the stairs. Fix any carpet that is loose or worn. Avoid having throw rugs at the top or bottom of the stairs. If you do have throw rugs, attach them to the floor with carpet tape. Make sure that you have a light switch at the top of the stairs and the bottom of the stairs. If you do not have them, ask someone to add them for you. What else can I do to help prevent falls? Wear shoes that: Do not have high  heels. Have rubber bottoms. Are comfortable and fit you well. Are closed at the toe. Do not wear sandals. If you use a stepladder: Make sure that it is fully opened. Do not climb a closed stepladder. Make sure that both sides of the stepladder are locked into place. Ask someone to hold it for you, if possible. Clearly mark and make sure that you can see: Any grab bars or handrails. First and last steps. Where the edge of each step is. Use tools that help you move around (mobility aids) if they are needed. These include: Canes. Walkers. Scooters. Crutches. Turn on the lights when you go into a dark area. Replace any light bulbs as soon as they burn out. Set up your furniture so you have a clear path. Avoid moving your furniture around. If any of your floors are uneven, fix them. If there are any pets around you, be aware of where they are. Review your medicines with your doctor. Some medicines can make you feel dizzy. This can increase your chance of falling. Ask your doctor what other things that you can do to help prevent falls. This information is not intended to replace advice given to you by your health care provider. Make sure you discuss any questions you have with your health care provider. Document Released: 11/12/2008 Document Revised: 06/24/2015 Document Reviewed: 02/20/2014 Elsevier Interactive Patient Education  2017 [employer].

## 2020-10-27 ENCOUNTER — Other Ambulatory Visit: Payer: Self-pay

## 2020-10-27 ENCOUNTER — Ambulatory Visit (INDEPENDENT_AMBULATORY_CARE_PROVIDER_SITE_OTHER): Payer: Medicare PPO

## 2020-10-27 DIAGNOSIS — Z23 Encounter for immunization: Secondary | ICD-10-CM

## 2020-11-24 ENCOUNTER — Inpatient Hospital Stay: Payer: Medicare PPO | Attending: Hematology

## 2020-11-24 ENCOUNTER — Other Ambulatory Visit: Payer: Self-pay

## 2020-11-24 DIAGNOSIS — D472 Monoclonal gammopathy: Secondary | ICD-10-CM | POA: Diagnosis not present

## 2020-11-24 LAB — CBC WITH DIFFERENTIAL/PLATELET
Abs Immature Granulocytes: 0.01 10*3/uL (ref 0.00–0.07)
Basophils Absolute: 0 10*3/uL (ref 0.0–0.1)
Basophils Relative: 1 %
Eosinophils Absolute: 0.2 10*3/uL (ref 0.0–0.5)
Eosinophils Relative: 4 %
HCT: 33.6 % — ABNORMAL LOW (ref 36.0–46.0)
Hemoglobin: 11 g/dL — ABNORMAL LOW (ref 12.0–15.0)
Immature Granulocytes: 0 %
Lymphocytes Relative: 31 %
Lymphs Abs: 1.8 10*3/uL (ref 0.7–4.0)
MCH: 28.6 pg (ref 26.0–34.0)
MCHC: 32.7 g/dL (ref 30.0–36.0)
MCV: 87.5 fL (ref 80.0–100.0)
Monocytes Absolute: 0.5 10*3/uL (ref 0.1–1.0)
Monocytes Relative: 8 %
Neutro Abs: 3.3 10*3/uL (ref 1.7–7.7)
Neutrophils Relative %: 56 %
Platelets: 277 10*3/uL (ref 150–400)
RBC: 3.84 MIL/uL — ABNORMAL LOW (ref 3.87–5.11)
RDW: 13.6 % (ref 11.5–15.5)
WBC: 5.7 10*3/uL (ref 4.0–10.5)
nRBC: 0 % (ref 0.0–0.2)

## 2020-11-24 LAB — CMP (CANCER CENTER ONLY)
ALT: 10 U/L (ref 0–44)
AST: 19 U/L (ref 15–41)
Albumin: 3.4 g/dL — ABNORMAL LOW (ref 3.5–5.0)
Alkaline Phosphatase: 97 U/L (ref 38–126)
Anion gap: 9 (ref 5–15)
BUN: 20 mg/dL (ref 8–23)
CO2: 21 mmol/L — ABNORMAL LOW (ref 22–32)
Calcium: 9.6 mg/dL (ref 8.9–10.3)
Chloride: 109 mmol/L (ref 98–111)
Creatinine: 2.01 mg/dL — ABNORMAL HIGH (ref 0.44–1.00)
GFR, Estimated: 24 mL/min — ABNORMAL LOW (ref >60)
Glucose, Bld: 87 mg/dL (ref 70–99)
Potassium: 3.9 mmol/L (ref 3.5–5.1)
Sodium: 139 mmol/L (ref 135–145)
Total Bilirubin: 0.4 mg/dL (ref 0.3–1.2)
Total Protein: 7.3 g/dL (ref 6.5–8.1)

## 2020-11-25 LAB — KAPPA/LAMBDA LIGHT CHAINS
Kappa free light chain: 88.8 mg/L — ABNORMAL HIGH (ref 3.3–19.4)
Kappa, lambda light chain ratio: 3.88 — ABNORMAL HIGH (ref 0.26–1.65)
Lambda free light chains: 22.9 mg/L (ref 5.7–26.3)

## 2020-11-29 LAB — MULTIPLE MYELOMA PANEL, SERUM
Albumin SerPl Elph-Mcnc: 3.3 g/dL (ref 2.9–4.4)
Albumin/Glob SerPl: 1.1 (ref 0.7–1.7)
Alpha 1: 0.2 g/dL (ref 0.0–0.4)
Alpha2 Glob SerPl Elph-Mcnc: 0.8 g/dL (ref 0.4–1.0)
B-Globulin SerPl Elph-Mcnc: 1 g/dL (ref 0.7–1.3)
Gamma Glob SerPl Elph-Mcnc: 1.3 g/dL (ref 0.4–1.8)
Globulin, Total: 3.3 g/dL (ref 2.2–3.9)
IgA: 318 mg/dL (ref 64–422)
IgG (Immunoglobin G), Serum: 1412 mg/dL (ref 586–1602)
IgM (Immunoglobulin M), Srm: 36 mg/dL (ref 26–217)
M Protein SerPl Elph-Mcnc: 0.2 g/dL — ABNORMAL HIGH
Total Protein ELP: 6.6 g/dL (ref 6.0–8.5)

## 2020-12-01 ENCOUNTER — Other Ambulatory Visit: Payer: Self-pay

## 2020-12-01 ENCOUNTER — Inpatient Hospital Stay: Payer: Medicare PPO | Attending: Hematology | Admitting: Hematology

## 2020-12-01 VITALS — BP 139/61 | HR 67 | Temp 97.5°F | Resp 18 | Wt 195.1 lb

## 2020-12-01 DIAGNOSIS — Z79899 Other long term (current) drug therapy: Secondary | ICD-10-CM | POA: Insufficient documentation

## 2020-12-01 DIAGNOSIS — D472 Monoclonal gammopathy: Secondary | ICD-10-CM | POA: Insufficient documentation

## 2020-12-01 DIAGNOSIS — I129 Hypertensive chronic kidney disease with stage 1 through stage 4 chronic kidney disease, or unspecified chronic kidney disease: Secondary | ICD-10-CM | POA: Diagnosis not present

## 2020-12-01 DIAGNOSIS — E1122 Type 2 diabetes mellitus with diabetic chronic kidney disease: Secondary | ICD-10-CM | POA: Diagnosis not present

## 2020-12-01 DIAGNOSIS — E1136 Type 2 diabetes mellitus with diabetic cataract: Secondary | ICD-10-CM | POA: Insufficient documentation

## 2020-12-01 DIAGNOSIS — E039 Hypothyroidism, unspecified: Secondary | ICD-10-CM | POA: Diagnosis not present

## 2020-12-01 DIAGNOSIS — Z85048 Personal history of other malignant neoplasm of rectum, rectosigmoid junction, and anus: Secondary | ICD-10-CM | POA: Insufficient documentation

## 2020-12-01 DIAGNOSIS — G4730 Sleep apnea, unspecified: Secondary | ICD-10-CM | POA: Insufficient documentation

## 2020-12-01 MED ORDER — HYDROCHLOROTHIAZIDE 25 MG PO TABS: 12.5000 mg | ORAL_TABLET | Freq: Every day | ORAL | Status: DC

## 2020-12-02 ENCOUNTER — Ambulatory Visit: Payer: Medicare PPO

## 2020-12-03 ENCOUNTER — Telehealth: Payer: Self-pay | Admitting: Hematology

## 2020-12-03 NOTE — Telephone Encounter (Signed)
Left message with follow-up appointments per 11/2 los. 

## 2020-12-06 DIAGNOSIS — N184 Chronic kidney disease, stage 4 (severe): Secondary | ICD-10-CM | POA: Diagnosis not present

## 2020-12-07 ENCOUNTER — Other Ambulatory Visit: Payer: Self-pay | Admitting: Internal Medicine

## 2020-12-07 DIAGNOSIS — Z139 Encounter for screening, unspecified: Secondary | ICD-10-CM

## 2020-12-07 NOTE — Progress Notes (Addendum)
HEMATOLOGY/ONCOLOGY CLINIC NOTE  Date of Service: .12/01/2020   Patient Care Team: Hoyt Koch, MD as PCP - General (Internal Medicine) Clent Jacks, MD as Consulting Physician (Ophthalmology)  CHIEF COMPLAINTS/PURPOSE OF CONSULTATION:   Follow-up for monoclonal paraproteinemia  HISTORY OF PRESENTING ILLNESS:   Lynn Ray is a wonderful 86 y.o. female who has been referred to [place] by Dr. Joelyn Oms at [facility] for evaluation and management of positive m-spike on SPEP.The pt reports that she is doing well overall. We are joined today by her husband.  The pt reports that these labs were done routinely as she was transferring care to a different doctor. She was previously seeing a doctor at [facility] prior to switching to Dr. Marylou Flesher. The pt notes that she started seeing a Nephrologist years ago but the numbers were very low. The first time she saw one was five years ago, but did not provide her with what the cause of her kidney disease was from. The pt notes that she did not receive a lot of information at [facility], one of the reasons she switched. The last few years, the pt has just been doing blood tests and not urine tests. The pt has had hypertension for a very long time, over ten years, and was diagnoses as pre-diabetic years ago as well. The pt has not been on any medications for diabetes. She is currently only on medication for hypothyroidism and hypertension. The pt notes that she had Grave's disease in the 1980s and had the radioactive iodine. The pt notes that she had surgery for rectal cancer in 2004, but did not need chemo or radiation. The pt has never had much protein in the urine, and her creatinine changed from 1.4 in 2016 to 1.8 just recently last month. The pt had an US Kidney that showed the right kidney was slightly smaller in size. The pt notes that Dr. Joelyn Oms believes the CKD is due to aging and chronic kidney disease and not needing immediate intervention.  Lab  results 04/05/2020 of SPEP was all WNL except m-spike of 0.3.  04/05/2020 Free Kappa of 69.2, Free Lambda of 26.7, Kappa Lambda ratio of 2.59.  On review of systems, pt reports anxiety and denies acute bone pains, fevers, chills, back pain, abdominal pain, and any other symptoms.  INTERVAL HISTORY   Lynn Ray is a wonderful 86 y.o. female who is here for f/u regarding her monoclonal paraproteinemia. The patient's last visit with [place] was on 05/31/2020. The pt reports that she is doing well overall.  Patient reports some pain from arthritis in her knees and shoulders but no other acute new focal back pain or headaches. Denies any new lumps or bumps.  Labs done on 11/24/2020 show CBC with a hemoglobin of 11 which remains unchanged, WBC count of 5.7k, and platelets of 277k. CMP stable with stable creatinine of 2.01.  No hypercalcemia. Myeloma panel shows stable M protein at 0.2 g/dL with no evidence of progression Slight elevation of kappa free light chains at 88.8 with kappa lambda ratio of 3.8.  This is at least partly due to her chronic kidney disease leading to decreased light chain excretion.   MEDICAL HISTORY:  Past Medical History:  Diagnosis Date   Arthritis    Cancer [facility]) 2004   rectal cancer   Cataract    right eye   Chiari malformation    CKD (chronic kidney disease), stage IV (HCC)    Depression    Diabetes  mellitus ([place])    "pre"   Hypertension    Sleep apnea    no CPAP   Thyroid disease    hyporthyroid    SURGICAL HISTORY: Past Surgical History:  Procedure Laterality Date   CATARACT EXTRACTION Bilateral    chiari malformation  2013   surgery   COLONOSCOPY     FLEXIBLE SIGMOIDOSCOPY     POLYPECTOMY      SOCIAL HISTORY: Social History   Socioeconomic History   Marital status: Married    Spouse name: Eddie Dibbles   Number of children: 2   Years of education: Masters   Highest education level: Not on file  Occupational History   Occupation: retired     Comment: retired  Tobacco Use   Smoking status: Former    Packs/day: 0.25    Types: Cigarettes   Smokeless tobacco: Never   Tobacco comments:    04/2013,quit  Vaping Use   Vaping Use: Never used  Substance and Sexual Activity   Alcohol use: No    Alcohol/week: 0.0 standard drinks   Drug use: No   Sexual activity: Not Currently  Other Topics Concern   Not on file  Social History Narrative   Patient consumes one cup of coffee daily   Social Determinants of Health   Financial Resource Strain: Low Risk    Difficulty of Paying Living Expenses: Not hard at all  Food Insecurity: No Food Insecurity   Worried About Charity fundraiser in the Last Year: Never true   [place] in the Last Year: Never true  Transportation Needs: No Transportation Needs   Lack of Transportation (Medical): No   Lack of Transportation (Non-Medical): No  Physical Activity: Inactive   Days of Exercise per Week: 0 days   Minutes of Exercise per Session: 0 min  Stress: No Stress Concern Present   Feeling of Stress : Not at all  Social Connections: Socially Integrated   Frequency of Communication with Friends and Family: More than three times a week   Frequency of Social Gatherings with Friends and Family: Once a week   Attends Religious Services: More than 4 times per year   Active Member of [employer] or Organizations: Yes   Attends Music therapist: More than 4 times per year   Marital Status: Married  Human resources officer Violence: Not At Risk   Fear of Current or Ex-Partner: No   Emotionally Abused: No   Physically Abused: No   Sexually Abused: No    FAMILY HISTORY: Family History  Problem Relation Age of Onset   Alzheimer's disease Mother    Stroke Mother        mini   Heart attack Mother    Hypertension Mother    Depression Other    Anemia Other    Obesity Other    Colon cancer Neg Hx    Esophageal cancer Neg Hx    Stomach cancer Neg Hx     ALLERGIES:  is allergic to  aspirin and tape.  MEDICATIONS:  Current Outpatient Medications  Medication Sig Dispense Refill   cholecalciferol (VITAMIN D3) 25 MCG (1000 UT) tablet Take 1,000 Units by mouth daily.     hydrochlorothiazide (HYDRODIURIL) 25 MG tablet Take 0.5 tablets (12.5 mg total) by mouth daily.     levothyroxine (SYNTHROID) 100 MCG tablet Take 1 tablet (100 mcg total) by mouth daily. 90 tablet 3   metoprolol succinate (TOPROL-XL) 50 MG 24 hr tablet TAKE 1 TABLET BY  MOUTH EVERY DAY 90 tablet 3   vitamin B-12 (CYANOCOBALAMIN) 100 MCG tablet Take 100 mcg by mouth daily.     No current facility-administered medications for this visit.    REVIEW OF SYSTEMS:   .10 Point review of Systems was done is negative except as noted above.   PHYSICAL EXAMINATION: ECOG PERFORMANCE STATUS: 1 - Symptomatic but completely ambulatory  . Vitals:   12/01/20 1410  BP: 139/61  Pulse: 67  Resp: 18  Temp: (!) 97.5 F (36.4 C)  SpO2: 100%   Filed Weights   12/01/20 1410  Weight: 195 lb 1.6 oz (88.5 kg)   .Body mass index is 39.41 kg/m. Marland Kitchen GENERAL:alert, in no acute distress and comfortable SKIN: no acute rashes, no significant lesions EYES: conjunctiva are pink and non-injected, sclera anicteric OROPHARYNX: MMM, no exudates, no oropharyngeal erythema or ulceration NECK: supple, no JVD LYMPH:  no palpable lymphadenopathy in the cervical, axillary or inguinal regions LUNGS: clear to auscultation b/l with normal respiratory effort HEART: regular rate & rhythm ABDOMEN:  normoactive bowel sounds , non tender, not distended. Extremity: no pedal edema PSYCH: alert & oriented x 3 with fluent speech NEURO: no focal motor/sensory deficits   LABORATORY DATA:  I have reviewed the data as listed  . CBC Latest Ref Rng & Units 11/24/2020 09/28/2020 04/29/2020  WBC 4.0 - 10.5 K/uL 5.7 5.3 5.3  Hemoglobin 12.0 - 15.0 g/dL 11.0(L) 11.0(L) 10.9(L)  Hematocrit 36.0 - 46.0 % 33.6(L) 34.2(L) 34.7(L)  Platelets 150 - 400  K/uL 277 262.0 266    . CMP Latest Ref Rng & Units 11/24/2020 09/28/2020 04/29/2020  Glucose 70 - 99 mg/dL 87 82 91  BUN 8 - 23 mg/dL '20 23 20  ' Creatinine 0.44 - 1.00 mg/dL 2.01(H) 1.91(H) 1.74(H)  Sodium 135 - 145 mmol/L 139 136 142  Potassium 3.5 - 5.1 mmol/L 3.9 4.2 4.0  Chloride 98 - 111 mmol/L 109 104 108  CO2 22 - 32 mmol/L 21(L) 25 23  Calcium 8.9 - 10.3 mg/dL 9.6 10.0 9.4  Total Protein 6.5 - 8.1 g/dL 7.3 - 7.4  Total Bilirubin 0.3 - 1.2 mg/dL 0.4 - 0.3  Alkaline Phos 38 - 126 U/L 97 - 100  AST 15 - 41 U/L 19 - 16  ALT 0 - 44 U/L 10 - 12     RADIOGRAPHIC STUDIES: I have personally reviewed the radiological images as listed and agreed with the findings in the report. No results found.   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 Arthritic changes in right and left shoulder joints. PLAN: -Discussed pt recent labwork,  Labs done on 11/24/2020 show CBC with a hemoglobin of 11 which remains unchanged, WBC count of 5.7k, and platelets of 277k. CMP stable with stable creatinine of 2.01.  No hypercalcemia. Myeloma panel shows stable M protein at 0.2 g/dL with no evidence of progression Slight elevation of kappa free light chains at 88.8 with kappa lambda ratio of 3.8.  This is at least partly due to her chronic kidney disease leading to decreased light chain excretion. -Advised pt that this appears to be MGUS at this time.  -Advised pt that there is a 1-2% risk of progression to myeloma with MGUS each year. -continue f/u with nephrology for CKD mx -Will see back in 6 months with labs.    FOLLOW UP: RTC with Dr Irene Limbo with  labs in 6 months Plz schedule these labs 1 week prior to clinic visit.   All of the patients questions were answered with apparent satisfaction. The patient knows to call the clinic with any problems, questions or concerns.  . The total time  spent in the appointment was 20 minutes and more than 50% was on counseling and direct patient cares.    Sullivan Lone MD [place] AAHIVMS [facility] Hematology/Oncology Physician [facility]

## 2020-12-11 ENCOUNTER — Ambulatory Visit
Admission: RE | Admit: 2020-12-11 | Discharge: 2020-12-11 | Disposition: A | Discharge status: Home / Self Care | Payer: Medicare PPO | Source: Ambulatory Visit | Attending: Internal Medicine | Admitting: Internal Medicine

## 2020-12-11 ENCOUNTER — Other Ambulatory Visit: Payer: Self-pay

## 2020-12-11 DIAGNOSIS — Z139 Encounter for screening, unspecified: Secondary | ICD-10-CM

## 2020-12-11 DIAGNOSIS — Z1231 Encounter for screening mammogram for malignant neoplasm of breast: Secondary | ICD-10-CM | POA: Diagnosis not present

## 2020-12-14 DIAGNOSIS — N184 Chronic kidney disease, stage 4 (severe): Secondary | ICD-10-CM | POA: Diagnosis not present

## 2020-12-14 DIAGNOSIS — I129 Hypertensive chronic kidney disease with stage 1 through stage 4 chronic kidney disease, or unspecified chronic kidney disease: Secondary | ICD-10-CM | POA: Diagnosis not present

## 2020-12-14 DIAGNOSIS — D472 Monoclonal gammopathy: Secondary | ICD-10-CM | POA: Diagnosis not present

## 2021-03-07 DIAGNOSIS — N184 Chronic kidney disease, stage 4 (severe): Secondary | ICD-10-CM | POA: Diagnosis not present

## 2021-03-07 DIAGNOSIS — D472 Monoclonal gammopathy: Secondary | ICD-10-CM | POA: Diagnosis not present

## 2021-03-07 DIAGNOSIS — I129 Hypertensive chronic kidney disease with stage 1 through stage 4 chronic kidney disease, or unspecified chronic kidney disease: Secondary | ICD-10-CM | POA: Diagnosis not present

## 2021-04-01 ENCOUNTER — Other Ambulatory Visit: Payer: Self-pay | Admitting: Internal Medicine

## 2021-05-13 ENCOUNTER — Telehealth: Payer: Self-pay | Admitting: Hematology

## 2021-05-13 NOTE — Telephone Encounter (Signed)
Rescheduled upcoming appointment due to provider's template change. Patient is aware of changes. ?

## 2021-05-18 ENCOUNTER — Encounter: Payer: Self-pay | Admitting: Internal Medicine

## 2021-05-18 ENCOUNTER — Ambulatory Visit: Payer: Medicare PPO | Admitting: Internal Medicine

## 2021-05-18 VITALS — BP 120/74 | HR 74 | Resp 18 | Ht 59.0 in | Wt 191.4 lb

## 2021-05-18 DIAGNOSIS — R5383 Other fatigue: Secondary | ICD-10-CM

## 2021-05-18 DIAGNOSIS — E039 Hypothyroidism, unspecified: Secondary | ICD-10-CM | POA: Diagnosis not present

## 2021-05-18 DIAGNOSIS — E781 Pure hyperglyceridemia: Secondary | ICD-10-CM

## 2021-05-18 DIAGNOSIS — I10 Essential (primary) hypertension: Secondary | ICD-10-CM | POA: Diagnosis not present

## 2021-05-18 DIAGNOSIS — Z0000 Encounter for general adult medical examination without abnormal findings: Secondary | ICD-10-CM | POA: Diagnosis not present

## 2021-05-18 DIAGNOSIS — N184 Chronic kidney disease, stage 4 (severe): Secondary | ICD-10-CM

## 2021-05-18 DIAGNOSIS — R7303 Prediabetes: Secondary | ICD-10-CM

## 2021-05-18 LAB — TSH: TSH: 0.05 u[IU]/mL — ABNORMAL LOW (ref 0.35–5.50)

## 2021-05-18 LAB — CBC
HCT: 32.9 % — ABNORMAL LOW (ref 36.0–46.0)
Hemoglobin: 10.7 g/dL — ABNORMAL LOW (ref 12.0–15.0)
MCHC: 32.7 g/dL (ref 30.0–36.0)
MCV: 86.6 fl (ref 78.0–100.0)
Platelets: 230 10*3/uL (ref 150.0–400.0)
RBC: 3.8 Mil/uL — ABNORMAL LOW (ref 3.87–5.11)
RDW: 13.9 % (ref 11.5–15.5)
WBC: 5.9 10*3/uL (ref 4.0–10.5)

## 2021-05-18 LAB — HEMOGLOBIN A1C: Hgb A1c MFr Bld: 6.4 % (ref 4.6–6.5)

## 2021-05-18 LAB — COMPREHENSIVE METABOLIC PANEL
ALT: 8 U/L (ref 0–35)
AST: 14 U/L (ref 0–37)
Albumin: 3.7 g/dL (ref 3.5–5.2)
Alkaline Phosphatase: 94 U/L (ref 39–117)
BUN: 26 mg/dL — ABNORMAL HIGH (ref 6–23)
CO2: 24 mEq/L (ref 19–32)
Calcium: 9.6 mg/dL (ref 8.4–10.5)
Chloride: 107 mEq/L (ref 96–112)
Creatinine, Ser: 1.99 mg/dL — ABNORMAL HIGH (ref 0.40–1.20)
GFR: 22.66 mL/min — ABNORMAL LOW (ref >60.00)
Glucose, Bld: 142 mg/dL — ABNORMAL HIGH (ref 70–99)
Potassium: 3.9 mEq/L (ref 3.5–5.1)
Sodium: 139 mEq/L (ref 135–145)
Total Bilirubin: 0.3 mg/dL (ref 0.2–1.2)
Total Protein: 7 g/dL (ref 6.0–8.3)

## 2021-05-18 LAB — LIPID PANEL
Cholesterol: 155 mg/dL (ref 0–200)
HDL: 48.2 mg/dL (ref >39.00)
NonHDL: 106.87
Total CHOL/HDL Ratio: 3
Triglycerides: 249 mg/dL — ABNORMAL HIGH (ref 0.0–149.0)
VLDL: 49.8 mg/dL — ABNORMAL HIGH (ref 0.0–40.0)

## 2021-05-18 LAB — LDL CHOLESTEROL, DIRECT: Direct LDL: 75 mg/dL

## 2021-05-18 LAB — VITAMIN B12: Vitamin B-12: 607 pg/mL (ref 211–911)

## 2021-05-18 LAB — T4, FREE: Free T4: 1.32 ng/dL (ref 0.60–1.60)

## 2021-05-18 NOTE — Patient Instructions (Addendum)
We will check the labs today. ? ?[facility] in [place] ?

## 2021-05-18 NOTE — Progress Notes (Signed)
? ?  Subjective:  ? ?Patient ID: Lynn Ray, female    DOB: [birth year], 87 y.o.   MRN: [id number] ? ?HPI ?The patient is an 87 YO female coming in for physical.  ? ?PMH, [place], social history reviewed and updated ? ?Review of Systems  ?Constitutional:  Positive for fatigue.  ?HENT: Negative.    ?Eyes: Negative.   ?Respiratory:  Negative for cough, chest tightness and shortness of breath.   ?Cardiovascular:  Negative for chest pain, palpitations and leg swelling.  ?Gastrointestinal:  Negative for abdominal distention, abdominal pain, constipation, diarrhea, nausea and vomiting.  ?Musculoskeletal: Negative.   ?Skin: Negative.   ?Neurological: Negative.   ?Psychiatric/Behavioral: Negative.    ? ?Objective:  ?Physical Exam ?Constitutional:   ?   Appearance: She is well-developed. She is obese.  ?HENT:  ?   Head: Normocephalic and atraumatic.  ?Cardiovascular:  ?   Rate and Rhythm: Normal rate and regular rhythm.  ?Pulmonary:  ?   Effort: Pulmonary effort is normal. No respiratory distress.  ?   Breath sounds: Normal breath sounds. No wheezing or rales.  ?Abdominal:  ?   General: Bowel sounds are normal. There is no distension.  ?   Palpations: Abdomen is soft.  ?   Tenderness: There is no abdominal tenderness. There is no rebound.  ?Musculoskeletal:  ?   Cervical back: Normal range of motion.  ?Skin: ?   General: Skin is warm and dry.  ?Neurological:  ?   Mental Status: She is alert and oriented to person, place, and time.  ?   Coordination: Coordination normal.  ? ? ?Vitals:  ? 05/18/21 1114  ?BP: 120/74  ?Pulse: 74  ?Resp: 18  ?SpO2: 98%  ?Weight: 191 lb 6.4 oz (86.8 kg)  ?Height: '4\' 11"'$  (1.499 m)  ? ? ?This visit occurred during the SARS-CoV-2 public health emergency.  Safety protocols were in place, including screening questions prior to the visit, additional usage of staff PPE, and extensive cleaning of exam room while observing appropriate contact time as indicated for disinfecting solutions.  ? ?Assessment & Plan:   ? ?

## 2021-05-20 ENCOUNTER — Encounter: Payer: Self-pay | Admitting: Internal Medicine

## 2021-05-20 NOTE — Assessment & Plan Note (Signed)
BMI 38 complicated by hypertension and GERD. Counseled about diet and exercise.  ?

## 2021-05-20 NOTE — Assessment & Plan Note (Signed)
Checking TSH and free T4. Some fatigue lately but not progressive. Adjust synthroid 100 mcg daily as needed.  ?

## 2021-05-20 NOTE — Assessment & Plan Note (Signed)
Checking CMP and adjust as needed.  

## 2021-05-20 NOTE — Assessment & Plan Note (Signed)
Checking HgA1c and adjust as needed.  

## 2021-05-20 NOTE — Assessment & Plan Note (Signed)
Flu shot yearly. Covid-19 counseled. Pneumonia complete. Shingrix counseled to get at pharmacy. Tetanus due 2029. Colonoscopy due 2025 if health dictates. Mammogram aged out, pap smear aged out and dexa complete. Counseled about sun safety and mole surveillance. Counseled about the dangers of distracted driving. Given 10 year screening recommendations.  ? ?

## 2021-05-20 NOTE — Assessment & Plan Note (Signed)
BP at goal on hctz 12.5 mg daily and metoprolol 50 mg daily. Checking CMP and adjust as needed. Checking lipid panel.  ?

## 2021-05-23 ENCOUNTER — Other Ambulatory Visit: Payer: Self-pay

## 2021-05-23 DIAGNOSIS — D472 Monoclonal gammopathy: Secondary | ICD-10-CM

## 2021-05-24 ENCOUNTER — Inpatient Hospital Stay: Payer: Medicare PPO | Attending: Hematology

## 2021-05-24 ENCOUNTER — Other Ambulatory Visit: Payer: Self-pay

## 2021-05-24 DIAGNOSIS — D472 Monoclonal gammopathy: Secondary | ICD-10-CM | POA: Insufficient documentation

## 2021-05-24 LAB — CBC WITH DIFFERENTIAL (CANCER CENTER ONLY)
Abs Immature Granulocytes: 0.01 10*3/uL (ref 0.00–0.07)
Basophils Absolute: 0.1 10*3/uL (ref 0.0–0.1)
Basophils Relative: 1 %
Eosinophils Absolute: 0.3 10*3/uL (ref 0.0–0.5)
Eosinophils Relative: 4 %
HCT: 32.3 % — ABNORMAL LOW (ref 36.0–46.0)
Hemoglobin: 10.2 g/dL — ABNORMAL LOW (ref 12.0–15.0)
Immature Granulocytes: 0 %
Lymphocytes Relative: 31 %
Lymphs Abs: 2 10*3/uL (ref 0.7–4.0)
MCH: 27.6 pg (ref 26.0–34.0)
MCHC: 31.6 g/dL (ref 30.0–36.0)
MCV: 87.5 fL (ref 80.0–100.0)
Monocytes Absolute: 0.5 10*3/uL (ref 0.1–1.0)
Monocytes Relative: 7 %
Neutro Abs: 3.7 10*3/uL (ref 1.7–7.7)
Neutrophils Relative %: 57 %
Platelet Count: 261 10*3/uL (ref 150–400)
RBC: 3.69 MIL/uL — ABNORMAL LOW (ref 3.87–5.11)
RDW: 13.4 % (ref 11.5–15.5)
WBC Count: 6.6 10*3/uL (ref 4.0–10.5)
nRBC: 0 % (ref 0.0–0.2)

## 2021-05-24 LAB — CMP (CANCER CENTER ONLY)
ALT: 7 U/L (ref 0–44)
AST: 14 U/L — ABNORMAL LOW (ref 15–41)
Albumin: 3.7 g/dL (ref 3.5–5.0)
Alkaline Phosphatase: 97 U/L (ref 38–126)
Anion gap: 6 (ref 5–15)
BUN: 26 mg/dL — ABNORMAL HIGH (ref 8–23)
CO2: 25 mmol/L (ref 22–32)
Calcium: 9.8 mg/dL (ref 8.9–10.3)
Chloride: 108 mmol/L (ref 98–111)
Creatinine: 1.98 mg/dL — ABNORMAL HIGH (ref 0.44–1.00)
GFR, Estimated: 24 mL/min — ABNORMAL LOW (ref >60)
Glucose, Bld: 94 mg/dL (ref 70–99)
Potassium: 3.8 mmol/L (ref 3.5–5.1)
Sodium: 139 mmol/L (ref 135–145)
Total Bilirubin: 0.2 mg/dL — ABNORMAL LOW (ref 0.3–1.2)
Total Protein: 7.2 g/dL (ref 6.5–8.1)

## 2021-05-25 LAB — KAPPA/LAMBDA LIGHT CHAINS
Kappa free light chain: 91.7 mg/L — ABNORMAL HIGH (ref 3.3–19.4)
Kappa, lambda light chain ratio: 3.67 — ABNORMAL HIGH (ref 0.26–1.65)
Lambda free light chains: 25 mg/L (ref 5.7–26.3)

## 2021-05-26 LAB — MULTIPLE MYELOMA PANEL, SERUM
Albumin SerPl Elph-Mcnc: 3.7 g/dL (ref 2.9–4.4)
Albumin/Glob SerPl: 1.2 (ref 0.7–1.7)
Alpha 1: 0.2 g/dL (ref 0.0–0.4)
Alpha2 Glob SerPl Elph-Mcnc: 0.7 g/dL (ref 0.4–1.0)
B-Globulin SerPl Elph-Mcnc: 0.9 g/dL (ref 0.7–1.3)
Gamma Glob SerPl Elph-Mcnc: 1.2 g/dL (ref 0.4–1.8)
Globulin, Total: 3.1 g/dL (ref 2.2–3.9)
IgA: 297 mg/dL (ref 64–422)
IgG (Immunoglobin G), Serum: 1525 mg/dL (ref 586–1602)
IgM (Immunoglobulin M), Srm: 30 mg/dL (ref 26–217)
M Protein SerPl Elph-Mcnc: 0.3 g/dL — ABNORMAL HIGH
Total Protein ELP: 6.8 g/dL (ref 6.0–8.5)

## 2021-05-31 ENCOUNTER — Inpatient Hospital Stay: Payer: Medicare PPO | Attending: Hematology | Admitting: Hematology

## 2021-05-31 ENCOUNTER — Other Ambulatory Visit: Payer: Self-pay

## 2021-05-31 VITALS — BP 151/54 | HR 71 | Temp 97.3°F | Resp 18 | Wt 190.3 lb

## 2021-05-31 DIAGNOSIS — I129 Hypertensive chronic kidney disease with stage 1 through stage 4 chronic kidney disease, or unspecified chronic kidney disease: Secondary | ICD-10-CM | POA: Insufficient documentation

## 2021-05-31 DIAGNOSIS — M129 Arthropathy, unspecified: Secondary | ICD-10-CM | POA: Insufficient documentation

## 2021-05-31 DIAGNOSIS — I820 Budd-Chiari syndrome: Secondary | ICD-10-CM | POA: Diagnosis not present

## 2021-05-31 DIAGNOSIS — N184 Chronic kidney disease, stage 4 (severe): Secondary | ICD-10-CM | POA: Insufficient documentation

## 2021-05-31 DIAGNOSIS — D472 Monoclonal gammopathy: Secondary | ICD-10-CM | POA: Diagnosis not present

## 2021-05-31 DIAGNOSIS — Z87891 Personal history of nicotine dependence: Secondary | ICD-10-CM | POA: Insufficient documentation

## 2021-05-31 DIAGNOSIS — Z85048 Personal history of other malignant neoplasm of rectum, rectosigmoid junction, and anus: Secondary | ICD-10-CM | POA: Insufficient documentation

## 2021-05-31 DIAGNOSIS — Z79899 Other long term (current) drug therapy: Secondary | ICD-10-CM | POA: Diagnosis not present

## 2021-05-31 DIAGNOSIS — E119 Type 2 diabetes mellitus without complications: Secondary | ICD-10-CM | POA: Diagnosis not present

## 2021-06-05 NOTE — Progress Notes (Signed)
? ? ?HEMATOLOGY/ONCOLOGY CLINIC NOTE ? ?Date of Service: .05/31/2021 ? ? ?Patient Care Team: ?Hoyt Koch, MD as PCP - General (Internal Medicine) ?Clent Jacks, MD as Consulting Physician (Ophthalmology) ? ?CHIEF COMPLAINTS/PURPOSE OF CONSULTATION:  ? ?Follow-up for monoclonal paraproteinemia likely MGUS ? ?HISTORY OF PRESENTING ILLNESS:  ?Please see previous note for details on initial presentation ? ? ?INTERVAL HISTORY ? ? ?Lynn Ray is is here for continued evaluation and management of her monoclonal paraproteinemia likely MGUS. ?Her last clinic visit with [place] was about 6 months ago. ?She notes no acute new symptoms.  No focal bone pains.  No fevers no chills no night sweats no unexpected weight loss. ?No significant other changes to her other medical issues or medications in the interim. ? ?Her lab results from her labs done on 05/24/2021 were discussed in details with her. ? ? ?MEDICAL HISTORY:  ?Past Medical History:  ?Diagnosis Date  ? Arthritis   ? Cancer [facility]) 2004  ? rectal cancer  ? Cataract   ? right eye  ? Chiari malformation   ? CKD (chronic kidney disease), stage IV ([place])   ? Depression   ? Diabetes mellitus ([place])   ? "pre"  ? Hypertension   ? Sleep apnea   ? no CPAP  ? Thyroid disease   ? hyporthyroid  ? ? ?SURGICAL HISTORY: ?Past Surgical History:  ?Procedure Laterality Date  ? CATARACT EXTRACTION Bilateral   ? chiari malformation  2013  ? surgery  ? COLONOSCOPY    ? FLEXIBLE SIGMOIDOSCOPY    ? POLYPECTOMY    ? ? ?SOCIAL HISTORY: ?Social History  ? ?Socioeconomic History  ? Marital status: Married  ?  Spouse name: Eddie Dibbles  ? Number of children: 2  ? Years of education: Masters  ? Highest education level: Not on file  ?Occupational History  ? Occupation: retired  ?  Comment: retired  ?Tobacco Use  ? Smoking status: Former  ?  Packs/day: 0.25  ?  Types: Cigarettes  ? Smokeless tobacco: Never  ? Tobacco comments:  ?  04/2013,quit  ?Vaping Use  ? Vaping Use: Never used  ?Substance and Sexual  Activity  ? Alcohol use: No  ?  Alcohol/week: 0.0 standard drinks  ? Drug use: No  ? Sexual activity: Not Currently  ?Other Topics Concern  ? Not on file  ?Social History Narrative  ? Patient consumes one cup of coffee daily  ? ?Social Determinants of Health  ? ?Financial Resource Strain: Low Risk   ? Difficulty of Paying Living Expenses: Not hard at all  ?Food Insecurity: No Food Insecurity  ? Worried About Charity fundraiser in the Last Year: Never true  ? Ran Out of Food in the Last Year: Never true  ?Transportation Needs: No Transportation Needs  ? Lack of Transportation (Medical): No  ? Lack of Transportation (Non-Medical): No  ?Physical Activity: Inactive  ? Days of Exercise per Week: 0 days  ? Minutes of Exercise per Session: 0 min  ?Stress: No Stress Concern Present  ? Feeling of Stress : Not at all  ?Social Connections: Socially Integrated  ? Frequency of Communication with Friends and Family: More than three times a week  ? Frequency of Social Gatherings with Friends and Family: Once a week  ? Attends Religious Services: More than 4 times per year  ? Active Member of Clubs or Organizations: Yes  ? Attends Archivist Meetings: More than 4 times per year  ? Marital Status:  Married  ?Intimate Partner Violence: Not At Risk  ? Fear of Current or Ex-Partner: No  ? Emotionally Abused: No  ? Physically Abused: No  ? Sexually Abused: No  ? ? ?FAMILY HISTORY: ?Family History  ?Problem Relation Age of Onset  ? Alzheimer's disease Mother   ? Stroke Mother   ?     mini  ? Heart attack Mother   ? Hypertension Mother   ? Depression Other   ? Anemia Other   ? Obesity Other   ? Colon cancer Neg Hx   ? Esophageal cancer Neg Hx   ? Stomach cancer Neg Hx   ? ? ?ALLERGIES:  is allergic to aspirin and tape. ? ?MEDICATIONS:  ?Current Outpatient Medications  ?Medication Sig Dispense Refill  ? hydrochlorothiazide (HYDRODIURIL) 25 MG tablet Take 0.5 tablets (12.5 mg total) by mouth daily.    ? levothyroxine (SYNTHROID)  100 MCG tablet TAKE 1 TABLET BY MOUTH EVERY DAY 90 tablet 3  ? metoprolol succinate (TOPROL-XL) 50 MG 24 hr tablet TAKE 1 TABLET BY MOUTH EVERY DAY 90 tablet 3  ? cholecalciferol (VITAMIN D3) 25 MCG (1000 UT) tablet Take 1,000 Units by mouth daily. (Patient not taking: Reported on 05/18/2021)    ? vitamin B-12 (CYANOCOBALAMIN) 100 MCG tablet Take 100 mcg by mouth daily. (Patient not taking: Reported on 05/18/2021)    ? ?No current facility-administered medications for this visit.  ? ? ?REVIEW OF SYSTEMS:   ?10 Point review of Systems was done is negative except as noted above. ? ? ?PHYSICAL EXAMINATION: ?ECOG PERFORMANCE STATUS: 1 - Symptomatic but completely ambulatory ? ?. ?Vitals:  ? 05/31/21 1332  ?BP: (!) 151/54  ?Pulse: 71  ?Resp: 18  ?Temp: (!) 97.3 ?F (36.3 ?C)  ?SpO2: 100%  ? ?Filed Weights  ? 05/31/21 1332  ?Weight: 190 lb 4.8 oz (86.3 kg)  ? ?.Body mass index is 38.44 kg/m?. ? ?. ?GENERAL:alert, in no acute distress and comfortable ?SKIN: no acute rashes, no significant lesions ?EYES: conjunctiva are pink and non-injected, sclera anicteric ?OROPHARYNX: MMM, no exudates, no oropharyngeal erythema or ulceration ?NECK: supple, no JVD ?LYMPH:  no palpable lymphadenopathy in the cervical, axillary or inguinal regions ?LUNGS: clear to auscultation b/l with normal respiratory effort ?HEART: regular rate & rhythm ?ABDOMEN:  normoactive bowel sounds , non tender, not distended. ?Extremity: no pedal edema ?PSYCH: alert & oriented x 3 with fluent speech ?NEURO: no focal motor/sensory deficits ? ? ? ?LABORATORY DATA:  ?I have reviewed the data as listed ? ?. ? ?  Latest Ref Rng & Units 05/24/2021  ?  3:49 PM 05/18/2021  ? 11:45 AM 11/24/2020  ?  2:16 PM  ?CBC  ?WBC 4.0 - 10.5 K/uL 6.6   5.9   5.7    ?Hemoglobin 12.0 - 15.0 g/dL 10.2   10.7   11.0    ?Hematocrit 36.0 - 46.0 % 32.3   32.9   33.6    ?Platelets 150 - 400 K/uL 261   230.0   277    ? ? ?. ? ?  Latest Ref Rng & Units 05/24/2021  ?  3:49 PM 05/18/2021  ? 11:45 AM  11/24/2020  ?  2:16 PM  ?CMP  ?Glucose 70 - 99 mg/dL 94   142   87    ?BUN 8 - 23 mg/dL _0 ?Creatinine 0.44 - 1.00 mg/dL 1.98   1.99   2.01    ?Sodium 135 -  145 mmol/L 139   139   139    ?Potassium 3.5 - 5.1 mmol/L 3.8   3.9   3.9    ?Chloride 98 - 111 mmol/L 108   107   109    ?CO2 22 - 32 mmol/L _0 ?Calcium 8.9 - 10.3 mg/dL 9.8   9.6   9.6    ?Total Protein 6.5 - 8.1 g/dL 7.2   7.0   7.3    ?Total Bilirubin 0.3 - 1.2 mg/dL 0.2   0.3   0.4    ?Alkaline Phos 38 - 126 U/L 97   94   97    ?AST 15 - 41 U/L _1 ?ALT 0 - 44 U/L _2 ? ? ? ? ? ?RADIOGRAPHIC STUDIES: ?I have personally reviewed the radiological images as listed and agreed with the findings in the report. ?No results found. ? ? ?ASSESSMENT & PLAN:  ? ?87 yo with  ? 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 Arthritic changes in right and left shoulder joints. ?PLAN: ?-Discussed patient's lab results from 05/24/2021 and details with her and her accompanying family member. ?-CBC shows stable mild anemia due to chronic kidney disease but no other significant changes. ?-CMP stable with chronic kidney disease creatinine 1.98.  No hypercalcemia. ?-Myeloma panel shows stable M spike of 0.3 g/dL. ?Myeloma panel shows stable M protein at 0.2 g/dL with no evidence of progression ?-Stable kappa lambda free light chains ?-Patient has no clinical or lab evidence of progression to active multiple myeloma at this time. ?-We again discussed that she has about 1 to 2% risk of progression to multiple myeloma from MGUS every year. ?-Continue follow-up with nephrology for management of chronic kidney disease ?We shall see her back in 6 months with labs ? ? ?FOLLOW UP: ?RTC with Dr Irene Limbo with labs in 6 months ?Plz schedule these labs 1 week prior to clinic visit. ? ?The total time spent in the appointment was 20 minutes*. ? ?All of the patient's questions were  answered with apparent satisfaction. The patient knows to call the clinic with any problems, questions or concerns. ? ? ?Sullivan Lone MD MS AAHIVMS [facility] ?Hematology/Oncology Physician ?[place]

## 2021-06-08 DIAGNOSIS — H903 Sensorineural hearing loss, bilateral: Secondary | ICD-10-CM | POA: Diagnosis not present

## 2021-06-13 ENCOUNTER — Telehealth: Payer: Self-pay | Admitting: Hematology

## 2021-06-13 NOTE — Telephone Encounter (Signed)
Scheduled follow-up appointments per 5/2 los. Patient is aware. ?

## 2021-08-11 DIAGNOSIS — N184 Chronic kidney disease, stage 4 (severe): Secondary | ICD-10-CM | POA: Diagnosis not present

## 2021-08-11 DIAGNOSIS — D472 Monoclonal gammopathy: Secondary | ICD-10-CM | POA: Diagnosis not present

## 2021-08-11 DIAGNOSIS — I129 Hypertensive chronic kidney disease with stage 1 through stage 4 chronic kidney disease, or unspecified chronic kidney disease: Secondary | ICD-10-CM | POA: Diagnosis not present

## 2021-08-16 ENCOUNTER — Ambulatory Visit: Payer: Medicare PPO | Admitting: Internal Medicine

## 2021-08-16 ENCOUNTER — Encounter: Payer: Self-pay | Admitting: Internal Medicine

## 2021-08-16 VITALS — BP 130/80 | HR 70 | Resp 18 | Ht 59.0 in | Wt 190.0 lb

## 2021-08-16 DIAGNOSIS — G8929 Other chronic pain: Secondary | ICD-10-CM | POA: Diagnosis not present

## 2021-08-16 DIAGNOSIS — M25511 Pain in right shoulder: Secondary | ICD-10-CM | POA: Diagnosis not present

## 2021-08-16 MED ORDER — METHYLPREDNISOLONE ACETATE 40 MG/ML IJ SUSP
40.0000 mg | Freq: Once | INTRAMUSCULAR | Status: AC
  Administered 2021-08-16: 40 mg via INTRAMUSCULAR

## 2021-08-16 MED ORDER — PREDNISONE 20 MG PO TABS: 40.0000 mg | ORAL_TABLET | Freq: Every day | ORAL | 0 refills | Status: DC

## 2021-08-16 NOTE — Progress Notes (Signed)
   Subjective:   Patient ID: Lynn Ray, female    DOB: [birth year], 87 y.o.   MRN: [id number]  HPI The patient is an 87 YO female coming in for arthritis problems.   Review of Systems  Constitutional: Negative.   HENT: Negative.    Eyes: Negative.   Respiratory:  Negative for cough, chest tightness and shortness of breath.   Cardiovascular:  Negative for chest pain, palpitations and leg swelling.  Gastrointestinal:  Negative for abdominal distention, abdominal pain, constipation, diarrhea, nausea and vomiting.  Musculoskeletal:  Positive for arthralgias and myalgias.  Skin: Negative.   Neurological: Negative.   Psychiatric/Behavioral: Negative.      Objective:  Physical Exam Constitutional:      Appearance: She is well-developed.  HENT:     Head: Normocephalic and atraumatic.  Cardiovascular:     Rate and Rhythm: Normal rate and regular rhythm.  Pulmonary:     Effort: Pulmonary effort is normal. No respiratory distress.     Breath sounds: Normal breath sounds. No wheezing or rales.  Abdominal:     General: Bowel sounds are normal. There is no distension.     Palpations: Abdomen is soft.     Tenderness: There is no abdominal tenderness. There is no rebound.  Musculoskeletal:        General: Tenderness present.     Cervical back: Normal range of motion.  Skin:    General: Skin is warm and dry.  Neurological:     Mental Status: She is alert and oriented to person, place, and time.     Coordination: Coordination abnormal.     Comments: Slow gait but steady     Vitals:   08/16/21 1333  BP: 130/80  Pulse: 70  Resp: 18  SpO2: 98%  Weight: 190 lb (86.2 kg)  Height: '4\' 11"'$  (1.499 m)    Assessment & Plan:  Depo-medrol 40 mg IM given at visit

## 2021-08-16 NOTE — Patient Instructions (Addendum)
We will give you the steroid shot and have sent in the prednisone prescription to take 2 pills daily for 5 days.

## 2021-08-19 NOTE — Assessment & Plan Note (Signed)
She is having a flare of right shoulder pain and of other joints as well. Given depo-medrol 40 mg IM and she cannot take NSAIDs due to CKD stage 4. Advised to use tylenol for pain. Rx prednisone to take 40 mg daily for 5 days to help with this flare.

## 2021-08-26 DIAGNOSIS — R32 Unspecified urinary incontinence: Secondary | ICD-10-CM | POA: Diagnosis not present

## 2021-08-26 DIAGNOSIS — Z6839 Body mass index (BMI) 39.0-39.9, adult: Secondary | ICD-10-CM | POA: Diagnosis not present

## 2021-08-26 DIAGNOSIS — I10 Essential (primary) hypertension: Secondary | ICD-10-CM | POA: Diagnosis not present

## 2021-08-26 DIAGNOSIS — E1151 Type 2 diabetes mellitus with diabetic peripheral angiopathy without gangrene: Secondary | ICD-10-CM | POA: Diagnosis not present

## 2021-08-26 DIAGNOSIS — E1122 Type 2 diabetes mellitus with diabetic chronic kidney disease: Secondary | ICD-10-CM | POA: Diagnosis not present

## 2021-08-26 DIAGNOSIS — E039 Hypothyroidism, unspecified: Secondary | ICD-10-CM | POA: Diagnosis not present

## 2021-08-26 DIAGNOSIS — M1990 Unspecified osteoarthritis, unspecified site: Secondary | ICD-10-CM | POA: Diagnosis not present

## 2021-08-26 DIAGNOSIS — N184 Chronic kidney disease, stage 4 (severe): Secondary | ICD-10-CM | POA: Diagnosis not present

## 2021-09-08 DIAGNOSIS — H1045 Other chronic allergic conjunctivitis: Secondary | ICD-10-CM | POA: Diagnosis not present

## 2021-09-08 DIAGNOSIS — Z961 Presence of intraocular lens: Secondary | ICD-10-CM | POA: Diagnosis not present

## 2021-09-08 DIAGNOSIS — E119 Type 2 diabetes mellitus without complications: Secondary | ICD-10-CM | POA: Diagnosis not present

## 2021-09-08 DIAGNOSIS — H04123 Dry eye syndrome of bilateral lacrimal glands: Secondary | ICD-10-CM | POA: Diagnosis not present

## 2021-10-17 ENCOUNTER — Telehealth: Payer: Self-pay | Admitting: Internal Medicine

## 2021-10-17 NOTE — Telephone Encounter (Signed)
LVM for pt to rtn my call to schedule AWV with [facility] call back # [phone] 

## 2021-11-18 ENCOUNTER — Ambulatory Visit: Payer: Medicare PPO | Admitting: Internal Medicine

## 2021-11-24 ENCOUNTER — Other Ambulatory Visit: Payer: Self-pay

## 2021-11-24 DIAGNOSIS — D472 Monoclonal gammopathy: Secondary | ICD-10-CM

## 2021-11-25 ENCOUNTER — Ambulatory Visit: Payer: Medicare PPO | Admitting: Internal Medicine

## 2021-11-25 ENCOUNTER — Encounter: Payer: Self-pay | Admitting: Internal Medicine

## 2021-11-25 VITALS — BP 136/88 | HR 73 | Temp 97.9°F | Ht 59.0 in | Wt 191.4 lb

## 2021-11-25 DIAGNOSIS — R7303 Prediabetes: Secondary | ICD-10-CM

## 2021-11-25 DIAGNOSIS — Z23 Encounter for immunization: Secondary | ICD-10-CM | POA: Diagnosis not present

## 2021-11-25 LAB — POCT GLYCOSYLATED HEMOGLOBIN (HGB A1C): HbA1c POC (<> result, manual entry): 6 % (ref 4.0–5.6)

## 2021-11-25 NOTE — Progress Notes (Signed)
   Subjective:   Patient ID: Lynn Ray, female    DOB: 11/07/1934, 87 y.o.   MRN: [id number]  HPI The patient is an 87 YO female coming in for follow up  Review of Systems  Constitutional:  Positive for activity change and fatigue.  HENT: Negative.    Eyes: Negative.   Respiratory:  Negative for cough, chest tightness and shortness of breath.   Cardiovascular:  Negative for chest pain, palpitations and leg swelling.  Gastrointestinal:  Negative for abdominal distention, abdominal pain, constipation, diarrhea, nausea and vomiting.  Musculoskeletal:  Positive for arthralgias.  Skin: Negative.   Neurological: Negative.   Psychiatric/Behavioral:  Positive for sleep disturbance.     Objective:  Physical Exam Constitutional:      Appearance: She is well-developed. She is obese.  HENT:     Head: Normocephalic and atraumatic.  Cardiovascular:     Rate and Rhythm: Normal rate and regular rhythm.  Pulmonary:     Effort: Pulmonary effort is normal. No respiratory distress.     Breath sounds: Normal breath sounds. No wheezing or rales.  Abdominal:     General: Bowel sounds are normal. There is no distension.     Palpations: Abdomen is soft.     Tenderness: There is no abdominal tenderness. There is no rebound.  Musculoskeletal:        General: Tenderness present.     Cervical back: Normal range of motion.  Skin:    General: Skin is warm and dry.  Neurological:     Mental Status: She is alert and oriented to person, place, and time.     Coordination: Coordination normal.     Vitals:   11/25/21 1527  BP: 136/88  Pulse: 73  Temp: 97.9 F (36.6 C)  TempSrc: Oral  SpO2: 96%  Weight: 191 lb 6 oz (86.8 kg)  Height: '4\' 11"'$  (1.499 m)    Assessment & Plan:  Flu shot given at visit

## 2021-11-25 NOTE — Patient Instructions (Signed)
We have done the flu shot and the sugars are 6.0 today which is good.

## 2021-11-25 NOTE — Assessment & Plan Note (Signed)
POC HgA1c done and 6.0 today which is improved from prior 6.4. Encouraged to keep diet changes and work on exercise as able.

## 2021-11-28 ENCOUNTER — Inpatient Hospital Stay: Payer: Medicare PPO | Attending: Hematology

## 2021-11-28 ENCOUNTER — Other Ambulatory Visit: Payer: Self-pay

## 2021-11-28 DIAGNOSIS — D472 Monoclonal gammopathy: Secondary | ICD-10-CM | POA: Diagnosis not present

## 2021-11-28 DIAGNOSIS — N189 Chronic kidney disease, unspecified: Secondary | ICD-10-CM | POA: Diagnosis not present

## 2021-11-28 DIAGNOSIS — D631 Anemia in chronic kidney disease: Secondary | ICD-10-CM | POA: Insufficient documentation

## 2021-11-28 LAB — CMP (CANCER CENTER ONLY)
ALT: 9 U/L (ref 0–44)
AST: 17 U/L (ref 15–41)
Albumin: 3.9 g/dL (ref 3.5–5.0)
Alkaline Phosphatase: 110 U/L (ref 38–126)
Anion gap: 7 (ref 5–15)
BUN: 22 mg/dL (ref 8–23)
CO2: 25 mmol/L (ref 22–32)
Calcium: 9.8 mg/dL (ref 8.9–10.3)
Chloride: 104 mmol/L (ref 98–111)
Creatinine: 2.24 mg/dL — ABNORMAL HIGH (ref 0.44–1.00)
GFR, Estimated: 21 mL/min — ABNORMAL LOW (ref >60)
Glucose, Bld: 194 mg/dL — ABNORMAL HIGH (ref 70–99)
Potassium: 3.7 mmol/L (ref 3.5–5.1)
Sodium: 136 mmol/L (ref 135–145)
Total Bilirubin: 0.3 mg/dL (ref 0.3–1.2)
Total Protein: 7.7 g/dL (ref 6.5–8.1)

## 2021-11-28 LAB — CBC WITH DIFFERENTIAL (CANCER CENTER ONLY)
Abs Immature Granulocytes: 0.01 10*3/uL (ref 0.00–0.07)
Basophils Absolute: 0.1 10*3/uL (ref 0.0–0.1)
Basophils Relative: 1 %
Eosinophils Absolute: 0.3 10*3/uL (ref 0.0–0.5)
Eosinophils Relative: 6 %
HCT: 35.5 % — ABNORMAL LOW (ref 36.0–46.0)
Hemoglobin: 11.4 g/dL — ABNORMAL LOW (ref 12.0–15.0)
Immature Granulocytes: 0 %
Lymphocytes Relative: 35 %
Lymphs Abs: 1.7 10*3/uL (ref 0.7–4.0)
MCH: 28.5 pg (ref 26.0–34.0)
MCHC: 32.1 g/dL (ref 30.0–36.0)
MCV: 88.8 fL (ref 80.0–100.0)
Monocytes Absolute: 0.3 10*3/uL (ref 0.1–1.0)
Monocytes Relative: 7 %
Neutro Abs: 2.5 10*3/uL (ref 1.7–7.7)
Neutrophils Relative %: 51 %
Platelet Count: 303 10*3/uL (ref 150–400)
RBC: 4 MIL/uL (ref 3.87–5.11)
RDW: 13.2 % (ref 11.5–15.5)
WBC Count: 4.9 10*3/uL (ref 4.0–10.5)
nRBC: 0 % (ref 0.0–0.2)

## 2021-11-29 LAB — KAPPA/LAMBDA LIGHT CHAINS
Kappa free light chain: 116.3 mg/L — ABNORMAL HIGH (ref 3.3–19.4)
Kappa, lambda light chain ratio: 3.94 — ABNORMAL HIGH (ref 0.26–1.65)
Lambda free light chains: 29.5 mg/L — ABNORMAL HIGH (ref 5.7–26.3)

## 2021-12-02 LAB — MULTIPLE MYELOMA PANEL, SERUM
Albumin SerPl Elph-Mcnc: 3.5 g/dL (ref 2.9–4.4)
Albumin/Glob SerPl: 1.1 (ref 0.7–1.7)
Alpha 1: 0.2 g/dL (ref 0.0–0.4)
Alpha2 Glob SerPl Elph-Mcnc: 0.9 g/dL (ref 0.4–1.0)
B-Globulin SerPl Elph-Mcnc: 1.1 g/dL (ref 0.7–1.3)
Gamma Glob SerPl Elph-Mcnc: 1.2 g/dL (ref 0.4–1.8)
Globulin, Total: 3.4 g/dL (ref 2.2–3.9)
IgA: 331 mg/dL (ref 64–422)
IgG (Immunoglobin G), Serum: 1432 mg/dL (ref 586–1602)
IgM (Immunoglobulin M), Srm: 37 mg/dL (ref 26–217)
M Protein SerPl Elph-Mcnc: 0.3 g/dL — ABNORMAL HIGH
Total Protein ELP: 6.9 g/dL (ref 6.0–8.5)

## 2021-12-05 ENCOUNTER — Other Ambulatory Visit: Payer: Self-pay

## 2021-12-05 ENCOUNTER — Inpatient Hospital Stay: Payer: Medicare PPO | Attending: Hematology | Admitting: Hematology

## 2021-12-05 VITALS — BP 155/60 | HR 76 | Temp 97.8°F | Resp 16 | Wt 193.5 lb

## 2021-12-05 DIAGNOSIS — E039 Hypothyroidism, unspecified: Secondary | ICD-10-CM | POA: Diagnosis not present

## 2021-12-05 DIAGNOSIS — F0390 Unspecified dementia without behavioral disturbance: Secondary | ICD-10-CM | POA: Diagnosis not present

## 2021-12-05 DIAGNOSIS — M129 Arthropathy, unspecified: Secondary | ICD-10-CM | POA: Diagnosis not present

## 2021-12-05 DIAGNOSIS — G4730 Sleep apnea, unspecified: Secondary | ICD-10-CM | POA: Diagnosis not present

## 2021-12-05 DIAGNOSIS — Z79899 Other long term (current) drug therapy: Secondary | ICD-10-CM | POA: Insufficient documentation

## 2021-12-05 DIAGNOSIS — D472 Monoclonal gammopathy: Secondary | ICD-10-CM | POA: Insufficient documentation

## 2021-12-05 DIAGNOSIS — G935 Compression of brain: Secondary | ICD-10-CM | POA: Diagnosis not present

## 2021-12-05 DIAGNOSIS — Z87891 Personal history of nicotine dependence: Secondary | ICD-10-CM | POA: Diagnosis not present

## 2021-12-05 DIAGNOSIS — E119 Type 2 diabetes mellitus without complications: Secondary | ICD-10-CM | POA: Insufficient documentation

## 2021-12-05 DIAGNOSIS — N184 Chronic kidney disease, stage 4 (severe): Secondary | ICD-10-CM | POA: Diagnosis not present

## 2021-12-05 DIAGNOSIS — I10 Essential (primary) hypertension: Secondary | ICD-10-CM | POA: Diagnosis not present

## 2021-12-05 NOTE — Progress Notes (Signed)
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  She was last seen by me on 05/31/2021 and was doing well overall without any new concerns.   She Ray here with Lynn Ray niece during today's visit. She complains that Lynn Ray arthritis and Lynn Ray mobility has been worse since our last visit. She reports Lynn Ray husband has dementia and Ray currently in hospital.   She denies weight loss, leg swelling, fever, chills, loss of appetite, abdominal pain during today's visit. Although, she occasionally gets abdominal soreness.  She reports she does not drink enough water, but will try to drink more water. She notes she has been eating well.   She notes that she has sleep apnea, but does not use cpap machine.   She Ray concerned about Lynn Ray blood pressure being slightly high during today's visit, 155/60. She reports she does not record Lynn Ray blood pressure at home.   She Ray complaint with all of Lynn Ray medications.    MEDICAL HISTORY:  Past Medical History:  Diagnosis Date   Arthritis    Cancer ([place]) 2004   rectal cancer   Cataract    right eye   Chiari malformation    CKD (chronic kidney disease), stage IV ([place])    Depression    Diabetes mellitus ([place])    "pre"   Hypertension    Sleep apnea    no CPAP   Thyroid disease    hyporthyroid    SURGICAL HISTORY: Past Surgical History:  Procedure Laterality Date   CATARACT EXTRACTION Bilateral    chiari malformation  2013   surgery   COLONOSCOPY     FLEXIBLE SIGMOIDOSCOPY      POLYPECTOMY      SOCIAL HISTORY: Social History   Socioeconomic History   Marital status: Married    Spouse name: Lynn Ray   Number of children: 2   Years of education: Masters   Highest education level: Not on file  Occupational History   Occupation: retired    Comment: retired  Tobacco Use   Smoking status: Former    Packs/day: 0.25    Types: Cigarettes   Smokeless tobacco: Never   Tobacco comments:    04/2013,quit  Vaping Use   Vaping Use: Never used  Substance and Sexual Activity   Alcohol use: No    Alcohol/week: 0.0 standard drinks of alcohol   Drug use: No   Sexual activity: Not Currently  Other Topics Concern   Not on file  Social History Narrative   Patient consumes one cup of coffee daily   Social Determinants of Health   Financial Resource Strain: Low Risk  (10/22/2020)   Overall Financial Resource Strain (CARDIA)    Difficulty of Paying Living Expenses: Not hard at all  Food Insecurity: No Food Insecurity (10/22/2020)   Hunger Vital Sign    Worried About Running Out of Food in the Last Year: Never true    Ran Out of Food in the Last Year: Never true  Transportation Needs: No Transportation Needs (10/22/2020)   PRAPARE - Transportation    Lack of Transportation (  Medical): No    Lack of Transportation (Non-Medical): No  Physical Activity: Inactive (10/22/2020)   Exercise Vital Sign    Days of Exercise per Week: 0 days    Minutes of Exercise per Session: 0 min  Stress: No Stress Concern Present (10/22/2020)   [place]    Feeling of Stress : Not at all  Social Connections: [place] (10/22/2020)   Social Connection and Isolation Panel [NHANES]    Frequency of Communication with Friends and Family: More than three times a week    Frequency of Social Gatherings with Friends and Family: Once a week    Attends Religious Services: More than 4 times per year    Active Member of [employer] or  Organizations: Yes    Attends Music therapist: More than 4 times per year    Marital Status: Married  Human resources officer Violence: Not At Risk (10/22/2020)   Humiliation, Afraid, Rape, and Kick questionnaire    Fear of Current or Ex-Partner: No    Emotionally Abused: No    Physically Abused: No    Sexually Abused: No    FAMILY HISTORY: Family History  Problem Relation Age of Onset   Alzheimer's disease Mother    Stroke Mother        mini   Heart attack Mother    Hypertension Mother    Depression Other    Anemia Other    Obesity Other    Colon cancer Neg Hx    Esophageal cancer Neg Hx    Stomach cancer Neg Hx     ALLERGIES:  Ray allergic to aspirin and tape.  MEDICATIONS:  Current Outpatient Medications  Medication Sig Dispense Refill   cholecalciferol (VITAMIN D3) 25 MCG (1000 UT) tablet Take 1,000 Units by mouth daily.     hydrochlorothiazide (HYDRODIURIL) 25 MG tablet Take 0.5 tablets (12.5 mg total) by mouth daily.     levothyroxine (SYNTHROID) 100 MCG tablet TAKE 1 TABLET BY MOUTH EVERY DAY 90 tablet 3   metoprolol succinate (TOPROL-XL) 50 MG 24 hr tablet TAKE 1 TABLET BY MOUTH EVERY DAY 90 tablet 3   vitamin B-12 (CYANOCOBALAMIN) 100 MCG tablet Take 100 mcg by mouth daily.     No current facility-administered medications for this visit.    REVIEW OF SYSTEMS:   10 Point review of Systems was done Ray negative except as noted above.   PHYSICAL EXAMINATION: ECOG PERFORMANCE STATUS: 1 - Symptomatic but completely ambulatory  . Vitals:   12/05/21 1342  BP: (!) 155/60  Pulse: 76  Resp: 16  Temp: 97.8 F (36.6 C)  SpO2: 100%    Filed Weights   12/05/21 1342  Weight: 193 lb 8 oz (87.8 kg)    .Body mass index Ray 39.08 kg/m.  Marland Kitchen GENERAL:alert, in no acute distress and comfortable SKIN: no acute rashes, no significant lesions EYES: conjunctiva are pink and non-injected, sclera anicteric OROPHARYNX: MMM, no exudates, no oropharyngeal erythema or  ulceration NECK: supple, no JVD LYMPH:  no palpable lymphadenopathy in the cervical, axillary or inguinal regions LUNGS: clear to auscultation b/l with normal respiratory effort HEART: regular rate & rhythm ABDOMEN:  normoactive bowel sounds , non tender, not distended. Extremity: no pedal edema PSYCH: alert & oriented x 3 with fluent speech NEURO: no focal motor/sensory deficits    LABORATORY DATA:  I have reviewed the data as listed  .    Latest Ref Rng & Units 11/28/2021  1:59 PM 05/24/2021    3:49 PM 05/18/2021   11:45 AM  CBC  WBC 4.0 - 10.5 K/uL 4.9  6.6  5.9   Hemoglobin 12.0 - 15.0 g/dL 11.4  10.2  10.7   Hematocrit 36.0 - 46.0 % 35.5  32.3  32.9   Platelets 150 - 400 K/uL 303  261  230.0     .    Latest Ref Rng & Units 11/28/2021    1:59 PM 05/24/2021    3:49 PM 05/18/2021   11:45 AM  CMP  Glucose 70 - 99 mg/dL 194  94  142   BUN 8 - 23 mg/dL _0 Creatinine 0.44 - 1.00 mg/dL 2.24  1.98  1.99   Sodium 135 - 145 mmol/L 136  139  139   Potassium 3.5 - 5.1 mmol/L 3.7  3.8  3.9   Chloride 98 - 111 mmol/L 104  108  107   CO2 22 - 32 mmol/L _1 Calcium 8.9 - 10.3 mg/dL 9.8  9.8  9.6   Total Protein 6.5 - 8.1 g/dL 7.7  7.2  7.0   Total Bilirubin 0.3 - 1.2 mg/dL 0.3  0.2  0.3   Alkaline Phos 38 - 126 U/L 110  97  94   AST 15 - 41 U/L _2 ALT 0 - 44 U/L _3 RADIOGRAPHIC STUDIES: I have personally reviewed the radiological images as listed and agreed with the findings in the report. No results found.   ASSESSMENT & PLAN:   87 yo with   1) Monoclonal Paraproteinemia - likely MGUS DG Bone Survey Met ([id number]) on 05/10/2020; no signs of bone tumors. Arthritic changes in right and left shoulder joints.  PLAN: -Discussed patient's lab results from 11/28/2021 and details with Lynn Ray and Lynn Ray accompanying family member. WBC count of 4.9K, hemoglobin of 11.4K, and platelets of 303. CMP stable with chronic kidney disease  creatinine 2.24.  No hypercalcemia. -Myeloma panel shows stable M spike of 0.3 g/dL. -Stable kappa lambda free light chains. -Patient has no clinical or lab evidence of progression to active multiple myeloma at this time. -We again discussed that she has about 1 to 2% risk of progression to multiple myeloma from MGUS every year. -Continue follow-up with nephrology for management of chronic kidney disease We will have labs in 6 months and have a phone visit.    FOLLOW-UP: Phone visit with Dr Irene Limbo in 6 months Plz schedule labs 1 week prior to phone visit.  The total time spent in the appointment was 20 minutes* .  All of the patient's questions were answered with apparent satisfaction. The patient knows to call the clinic with any problems, questions or concerns.   Zettie Cooley, am acting as a scribe for Sullivan Lone, MD.  Sullivan Lone MD [place] AAHIVMS [facility] Hematology/Oncology [facility]  .*Total Encounter Time as defined by the Centers for Medicare and Medicaid Services includes, in addition to the face-to-face time of a patient visit (documented in the note above) non-face-to-face time: obtaining and reviewing outside history, ordering and reviewing medications, tests or procedures, care coordination (communications with other health care professionals or caregivers) and documentation in the medical record.

## 2022-02-14 DIAGNOSIS — D472 Monoclonal gammopathy: Secondary | ICD-10-CM | POA: Diagnosis not present

## 2022-02-14 DIAGNOSIS — N184 Chronic kidney disease, stage 4 (severe): Secondary | ICD-10-CM | POA: Diagnosis not present

## 2022-02-14 DIAGNOSIS — I129 Hypertensive chronic kidney disease with stage 1 through stage 4 chronic kidney disease, or unspecified chronic kidney disease: Secondary | ICD-10-CM | POA: Diagnosis not present

## 2022-02-14 LAB — PROTEIN / CREATININE RATIO, URINE
Albumin, U: 63.2
Creatinine, Urine: 164.8

## 2022-02-14 LAB — BASIC METABOLIC PANEL
BUN: 21 (ref 4–21)
Chloride: 107 (ref 99–108)
Creatinine: 2 — AB (ref 0.5–1.1)
Glucose: 95
Potassium: 4.5 mEq/L (ref 3.5–5.1)
Sodium: 138 (ref 137–147)

## 2022-02-14 LAB — COMPREHENSIVE METABOLIC PANEL
Albumin: 4 (ref 3.5–5.0)
Calcium: 10.4 (ref 8.7–10.7)
eGFR: 24

## 2022-02-14 LAB — MICROALBUMIN / CREATININE URINE RATIO: Microalb Creat Ratio: 38

## 2022-02-23 ENCOUNTER — Encounter: Payer: Self-pay | Admitting: Internal Medicine

## 2022-02-24 ENCOUNTER — Encounter: Payer: Self-pay | Admitting: Hematology

## 2022-04-18 ENCOUNTER — Ambulatory Visit: Payer: Medicare PPO | Admitting: Internal Medicine

## 2022-04-18 ENCOUNTER — Encounter: Payer: Self-pay | Admitting: Internal Medicine

## 2022-04-18 VITALS — BP 140/80 | HR 74 | Temp 97.9°F | Wt 184.0 lb

## 2022-04-18 DIAGNOSIS — H539 Unspecified visual disturbance: Secondary | ICD-10-CM | POA: Insufficient documentation

## 2022-04-18 DIAGNOSIS — N184 Chronic kidney disease, stage 4 (severe): Secondary | ICD-10-CM | POA: Diagnosis not present

## 2022-04-18 NOTE — Assessment & Plan Note (Signed)
Recent eye exam without cause. Episodes of loss of vision and dizziness in the last few months. Could be ocular migraine, TIA, grief. Checking CT head to rule out structural lesion (hx colorectal cancer) and stroke and bleeding.

## 2022-04-18 NOTE — Patient Instructions (Addendum)
Tylenol is safe at up to 3000 mg daily for pain.  We will check the scan of the brain to check.

## 2022-04-18 NOTE — Progress Notes (Signed)
   Subjective:   Patient ID: Lynn Ray, female    DOB: July 26, 1934, 88 y.o.   MRN: [id number]  HPI The patient is an 88 YO female coming in for several concerns. Loss of spouse Nov 2023.   Review of Systems  Constitutional:  Positive for activity change and appetite change.  HENT: Negative.    Eyes:  Positive for visual disturbance.  Respiratory:  Negative for cough, chest tightness and shortness of breath.   Cardiovascular:  Negative for chest pain, palpitations and leg swelling.  Gastrointestinal:  Positive for abdominal pain. Negative for abdominal distention, constipation, diarrhea, nausea and vomiting.  Musculoskeletal:  Positive for arthralgias.  Skin: Negative.   Neurological:  Positive for dizziness.  Psychiatric/Behavioral: Negative.      Objective:  Physical Exam Constitutional:      Appearance: She is well-developed. She is obese.  HENT:     Head: Normocephalic and atraumatic.  Eyes:     Extraocular Movements: Extraocular movements intact.     Pupils: Pupils are equal, round, and reactive to light.  Cardiovascular:     Rate and Rhythm: Normal rate and regular rhythm.  Pulmonary:     Effort: Pulmonary effort is normal. No respiratory distress.     Breath sounds: Normal breath sounds. No wheezing or rales.  Abdominal:     General: Bowel sounds are normal. There is no distension.     Palpations: Abdomen is soft.     Tenderness: There is no abdominal tenderness. There is no rebound.  Musculoskeletal:        General: Tenderness present.     Cervical back: Normal range of motion.  Skin:    General: Skin is warm and dry.  Neurological:     Mental Status: She is alert and oriented to person, place, and time.     Coordination: Coordination normal.     Vitals:   04/18/22 1348  BP: (!) 140/80  Pulse: 74  Temp: 97.9 F (36.6 C)  TempSrc: Oral  SpO2: 99%  Weight: 184 lb (83.5 kg)    Assessment & Plan:  Visit time 25 minutes in face to face communication  with patient and coordination of care, additional 5 minutes spent in record review, coordination or care, ordering tests, communicating/referring to other healthcare professionals, documenting in medical records all on the same day of the visit for total time 30 minutes spent on the visit.

## 2022-04-21 NOTE — Assessment & Plan Note (Signed)
Recent monitoring through nephrology stable and gets every 4-6 months. BP at goal today.

## 2022-05-19 ENCOUNTER — Ambulatory Visit
Admission: RE | Admit: 2022-05-19 | Discharge: 2022-05-19 | Disposition: A | Discharge status: Home / Self Care | Payer: Medicare PPO | Source: Ambulatory Visit | Attending: Internal Medicine | Admitting: Internal Medicine

## 2022-05-19 DIAGNOSIS — G9389 Other specified disorders of brain: Secondary | ICD-10-CM | POA: Diagnosis not present

## 2022-05-19 DIAGNOSIS — H538 Other visual disturbances: Secondary | ICD-10-CM | POA: Diagnosis not present

## 2022-05-19 DIAGNOSIS — R42 Dizziness and giddiness: Secondary | ICD-10-CM | POA: Diagnosis not present

## 2022-05-19 DIAGNOSIS — G459 Transient cerebral ischemic attack, unspecified: Secondary | ICD-10-CM | POA: Diagnosis not present

## 2022-05-19 DIAGNOSIS — H539 Unspecified visual disturbance: Secondary | ICD-10-CM

## 2022-05-26 ENCOUNTER — Ambulatory Visit: Payer: Medicare PPO | Admitting: Internal Medicine

## 2022-06-02 ENCOUNTER — Other Ambulatory Visit: Payer: Self-pay | Admitting: Internal Medicine

## 2022-06-05 ENCOUNTER — Other Ambulatory Visit: Payer: Self-pay

## 2022-06-05 DIAGNOSIS — D472 Monoclonal gammopathy: Secondary | ICD-10-CM

## 2022-06-06 ENCOUNTER — Inpatient Hospital Stay: Payer: Medicare PPO | Attending: Hematology

## 2022-06-06 ENCOUNTER — Other Ambulatory Visit: Payer: Self-pay

## 2022-06-06 DIAGNOSIS — E119 Type 2 diabetes mellitus without complications: Secondary | ICD-10-CM | POA: Insufficient documentation

## 2022-06-06 DIAGNOSIS — G4730 Sleep apnea, unspecified: Secondary | ICD-10-CM | POA: Insufficient documentation

## 2022-06-06 DIAGNOSIS — N184 Chronic kidney disease, stage 4 (severe): Secondary | ICD-10-CM | POA: Insufficient documentation

## 2022-06-06 DIAGNOSIS — D472 Monoclonal gammopathy: Secondary | ICD-10-CM | POA: Diagnosis not present

## 2022-06-06 DIAGNOSIS — I129 Hypertensive chronic kidney disease with stage 1 through stage 4 chronic kidney disease, or unspecified chronic kidney disease: Secondary | ICD-10-CM | POA: Insufficient documentation

## 2022-06-06 DIAGNOSIS — E039 Hypothyroidism, unspecified: Secondary | ICD-10-CM | POA: Diagnosis not present

## 2022-06-06 LAB — CMP (CANCER CENTER ONLY)
ALT: 6 U/L (ref 0–44)
AST: 14 U/L — ABNORMAL LOW (ref 15–41)
Albumin: 3.9 g/dL (ref 3.5–5.0)
Alkaline Phosphatase: 92 U/L (ref 38–126)
Anion gap: 6 (ref 5–15)
BUN: 27 mg/dL — ABNORMAL HIGH (ref 8–23)
CO2: 25 mmol/L (ref 22–32)
Calcium: 9.7 mg/dL (ref 8.9–10.3)
Chloride: 106 mmol/L (ref 98–111)
Creatinine: 2.11 mg/dL — ABNORMAL HIGH (ref 0.44–1.00)
GFR, Estimated: 23 mL/min — ABNORMAL LOW (ref >60)
Glucose, Bld: 76 mg/dL (ref 70–99)
Potassium: 4 mmol/L (ref 3.5–5.1)
Sodium: 137 mmol/L (ref 135–145)
Total Bilirubin: 0.2 mg/dL — ABNORMAL LOW (ref 0.3–1.2)
Total Protein: 7.3 g/dL (ref 6.5–8.1)

## 2022-06-06 LAB — CBC WITH DIFFERENTIAL (CANCER CENTER ONLY)
Abs Immature Granulocytes: 0.01 10*3/uL (ref 0.00–0.07)
Basophils Absolute: 0.1 10*3/uL (ref 0.0–0.1)
Basophils Relative: 1 %
Eosinophils Absolute: 0.3 10*3/uL (ref 0.0–0.5)
Eosinophils Relative: 5 %
HCT: 33.7 % — ABNORMAL LOW (ref 36.0–46.0)
Hemoglobin: 11 g/dL — ABNORMAL LOW (ref 12.0–15.0)
Immature Granulocytes: 0 %
Lymphocytes Relative: 32 %
Lymphs Abs: 1.8 10*3/uL (ref 0.7–4.0)
MCH: 28.6 pg (ref 26.0–34.0)
MCHC: 32.6 g/dL (ref 30.0–36.0)
MCV: 87.5 fL (ref 80.0–100.0)
Monocytes Absolute: 0.4 10*3/uL (ref 0.1–1.0)
Monocytes Relative: 8 %
Neutro Abs: 3 10*3/uL (ref 1.7–7.7)
Neutrophils Relative %: 54 %
Platelet Count: 263 10*3/uL (ref 150–400)
RBC: 3.85 MIL/uL — ABNORMAL LOW (ref 3.87–5.11)
RDW: 13.6 % (ref 11.5–15.5)
WBC Count: 5.6 10*3/uL (ref 4.0–10.5)
nRBC: 0 % (ref 0.0–0.2)

## 2022-06-07 LAB — KAPPA/LAMBDA LIGHT CHAINS
Kappa free light chain: 94.3 mg/L — ABNORMAL HIGH (ref 3.3–19.4)
Kappa, lambda light chain ratio: 3.03 — ABNORMAL HIGH (ref 0.26–1.65)
Lambda free light chains: 31.1 mg/L — ABNORMAL HIGH (ref 5.7–26.3)

## 2022-06-13 ENCOUNTER — Inpatient Hospital Stay (HOSPITAL_BASED_OUTPATIENT_CLINIC_OR_DEPARTMENT_OTHER): Payer: Medicare PPO | Admitting: Hematology

## 2022-06-13 DIAGNOSIS — E039 Hypothyroidism, unspecified: Secondary | ICD-10-CM | POA: Diagnosis not present

## 2022-06-13 DIAGNOSIS — E119 Type 2 diabetes mellitus without complications: Secondary | ICD-10-CM | POA: Diagnosis not present

## 2022-06-13 DIAGNOSIS — I129 Hypertensive chronic kidney disease with stage 1 through stage 4 chronic kidney disease, or unspecified chronic kidney disease: Secondary | ICD-10-CM | POA: Diagnosis not present

## 2022-06-13 DIAGNOSIS — D472 Monoclonal gammopathy: Secondary | ICD-10-CM | POA: Diagnosis not present

## 2022-06-13 DIAGNOSIS — G4730 Sleep apnea, unspecified: Secondary | ICD-10-CM | POA: Diagnosis not present

## 2022-06-13 DIAGNOSIS — N184 Chronic kidney disease, stage 4 (severe): Secondary | ICD-10-CM | POA: Diagnosis not present

## 2022-06-13 LAB — MULTIPLE MYELOMA PANEL, SERUM
Albumin SerPl Elph-Mcnc: 3.3 g/dL (ref 2.9–4.4)
Albumin/Glob SerPl: 1.1 (ref 0.7–1.7)
Alpha 1: 0.2 g/dL (ref 0.0–0.4)
Alpha2 Glob SerPl Elph-Mcnc: 0.8 g/dL (ref 0.4–1.0)
B-Globulin SerPl Elph-Mcnc: 1 g/dL (ref 0.7–1.3)
Gamma Glob SerPl Elph-Mcnc: 1.2 g/dL (ref 0.4–1.8)
Globulin, Total: 3.2 g/dL (ref 2.2–3.9)
IgA: 290 mg/dL (ref 64–422)
IgG (Immunoglobin G), Serum: 1395 mg/dL (ref 586–1602)
IgM (Immunoglobulin M), Srm: 35 mg/dL (ref 26–217)
M Protein SerPl Elph-Mcnc: 0.3 g/dL — ABNORMAL HIGH
Total Protein ELP: 6.5 g/dL (ref 6.0–8.5)

## 2022-06-13 NOTE — Progress Notes (Signed)
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  .I connected with Lynn Ray on 06/13/2022 at  3:30 PM EDT by telephone visit and verified that I am speaking with the correct person using two identifiers.   Patient was last seen by me on 12/05/2021 and she complained of balance issues due to arthritis, sleep apnea, and occasional abdominal soreness.   Patient reports she has been doing well overall without any new or severe medical concerns since our last visit. Patient notes she recently lost her husband. She is currently grieving with the loss of her husband as it was all of a sudden.   She denies any new infection issues, fever, chills, night sweats, abdominal pain, chest pain, back pain, or leg swelling. She does report of arthritis progression which does affect her mobility. She does complain of back pain.   She regularly follows-up with her PCP and her Nephrologist.   Patient notes that she has a caretaker at night.   I discussed the limitations, risks, security and privacy concerns of performing an evaluation and management service by telemedicine and the availability of in-person appointments. I also discussed with the patient that there may be a patient responsible charge related to this service. The patient expressed understanding and agreed to proceed.   Other persons participating in the visit and their role in the encounter: None   Patient's location: Home   Provider's location: [facility]   Chief Complaint: MGUS   MEDICAL HISTORY:  Past Medical History:  Diagnosis Date   Arthritis    Cancer ([facility]) 2004   rectal cancer   Cataract    right eye   Chiari malformation    CKD (chronic kidney disease), stage IV ([facility])    Depression    Diabetes mellitus ([facility])    "pre"   Hypertension    Sleep apnea    no CPAP   Thyroid disease    hyporthyroid    SURGICAL HISTORY: Past Surgical History:  Procedure Laterality Date   CATARACT EXTRACTION Bilateral    chiari malformation  2013   surgery   COLONOSCOPY     FLEXIBLE SIGMOIDOSCOPY     POLYPECTOMY      SOCIAL HISTORY: Social History   Socioeconomic History   Marital status: Married    Spouse name: [name]   Number of children: 2   Years of education: Masters   Highest education level: Not on file  Occupational History   Occupation: retired    Comment: retired  Tobacco Use   Smoking status: Former    Packs/day: 0.25    Types: Cigarettes   Smokeless tobacco: Never   Tobacco comments:    04/2013,quit  Vaping Use   Vaping Use: Never used  Substance and Sexual Activity   Alcohol use: No    Alcohol/week: 0.0 standard drinks of alcohol   Drug use: No   Sexual activity: Not Currently  Other Topics Concern   Not on file  Social History Narrative   Patient consumes one cup of coffee daily  Social Determinants of Health   Financial Resource Strain: Low Risk  (10/22/2020)   Overall Financial Resource Strain (CARDIA)    Difficulty of Paying Living Expenses: Not hard at all  Food Insecurity: No Food Insecurity (10/22/2020)   Hunger Vital Sign    Worried About Running Out of Food in the Last Year: Never true    Ran Out of Food in the Last Year: Never true  Transportation Needs: No Transportation Needs (10/22/2020)   PRAPARE - Administrator, Civil Service (Medical): No    Lack of Transportation (Non-Medical): No  Physical Activity: Inactive (10/22/2020)   Exercise Vital Sign     Days of Exercise per Week: 0 days    Minutes of Exercise per Session: 0 min  Stress: No Stress Concern Present (10/22/2020)   [employer] of Occupational Health - Occupational Stress Questionnaire    Feeling of Stress : Not at all  Social Connections: Socially Integrated (10/22/2020)   Social Connection and Isolation Panel [NHANES]    Frequency of Communication with Friends and Family: More than three times a week    Frequency of Social Gatherings with Friends and Family: Once a week    Attends Religious Services: More than 4 times per year    Active Member of [employer] or Organizations: Yes    Attends Engineer, structural: More than 4 times per year    Marital Status: Married  Catering manager Violence: Not At Risk (10/22/2020)   Humiliation, Afraid, Rape, and Kick questionnaire    Fear of Current or Ex-Partner: No    Emotionally Abused: No    Physically Abused: No    Sexually Abused: No    FAMILY HISTORY: Family History  Problem Relation Age of Onset   Alzheimer's disease Mother    Stroke Mother        mini   Heart attack Mother    Hypertension Mother    Depression Other    Anemia Other    Obesity Other    Colon cancer Neg Hx    Esophageal cancer Neg Hx    Stomach cancer Neg Hx     ALLERGIES:  is allergic to aspirin and tape.  MEDICATIONS:  Current Outpatient Medications  Medication Sig Dispense Refill   cholecalciferol (VITAMIN D3) 25 MCG (1000 UT) tablet Take 1,000 Units by mouth daily.     hydrochlorothiazide (HYDRODIURIL) 25 MG tablet Take 0.5 tablets (12.5 mg total) by mouth daily.     levothyroxine (SYNTHROID) 100 MCG tablet TAKE 1 TABLET BY MOUTH EVERY DAY 90 tablet 3   metoprolol succinate (TOPROL-XL) 50 MG 24 hr tablet TAKE 1 TABLET BY MOUTH EVERY DAY 90 tablet 3   vitamin B-12 (CYANOCOBALAMIN) 100 MCG tablet Take 100 mcg by mouth daily.     No current facility-administered medications for this visit.    REVIEW OF SYSTEMS:   10 Point review  of Systems was done is negative except as noted above.   PHYSICAL EXAMINATION: TELE-MED VISIT  LABORATORY DATA:  I have reviewed the data as listed  .    Latest Ref Rng & Units 06/06/2022    1:56 PM 11/28/2021    1:59 PM 05/24/2021    3:49 PM  CBC  WBC 4.0 - 10.5 K/uL 5.6  4.9  6.6   Hemoglobin 12.0 - 15.0 g/dL 95.6  21.3  08.6   Hematocrit 36.0 - 46.0 % 33.7  35.5  32.3   Platelets 150 - 400 K/uL 263  303  261     .    Latest Ref Rng & Units 06/06/2022    1:56 PM 02/14/2022   12:00 AM 11/28/2021    1:59 PM  CMP  Glucose 70 - 99 mg/dL 76   161   BUN 8 - 23 mg/dL 27  21     22    Creatinine 0.44 - 1.00 mg/dL 0.96  2.0     0.45   Sodium 135 - 145 mmol/L 137  138     136   Potassium 3.5 - 5.1 mmol/L 4.0  4.5     3.7   Chloride 98 - 111 mmol/L 106  107     104   CO2 22 - 32 mmol/L 25   25   Calcium 8.9 - 10.3 mg/dL 9.7  40.9     9.8   Total Protein 6.5 - 8.1 g/dL 7.3   7.7   Total Bilirubin 0.3 - 1.2 mg/dL 0.2   0.3   Alkaline Phos 38 - 126 U/L 92   110   AST 15 - 41 U/L 14   17   ALT 0 - 44 U/L 6   9      This result is from an external source.       RADIOGRAPHIC STUDIES: I have personally reviewed the radiological images as listed and agreed with the findings in the report. No results found.   ASSESSMENT & PLAN:   88 yo with   1) Monoclonal Paraproteinemia - likely MGUS DG Bone Survey Met ([id number]) on 05/10/2020; no signs of bone tumors. Arthritic changes in right and left shoulder joints.  PLAN: -Discussed lab results from 06/06/2022 with the patient. CBC shows slightly decreased hemoglobin at 11.0 and slightly decreased hematocrit at 33.7%. CMP shows slightly elevated BUN at 27 and slightly elevated creatinine at 2.11. Shows improved Kappa/Lambda light chain ratio from 3.94 to 3.03. No hypercalcemia.  -Multiple myeloma panel results show Stable M spike of 0.3g/dl -Patient has no clinical or lab evidence of progression to active multiple myeloma at this  time. -We again discussed that she has about 1 to 2% risk of progression to multiple myeloma from MGUS every year. -Continue follow-up with nephrology for management of chronic kidney disease We will have labs in 6 months.   FOLLOW-UP: RTC with Dr Candise Che in 6 months Plz schedule labs 1 week prior to phone visit.   The total time spent in the appointment was 15 minutes* .  All of the patient's questions were answered with apparent satisfaction. The patient knows to call the clinic with any problems, questions or concerns.   Wyvonnia Lora MD [place] AAHIVMS [facility] Hematology/Oncology Physician [facility]  .*Total Encounter Time as defined by the Centers for Medicare and Medicaid Services includes, in addition to the face-to-face time of a patient visit (documented in the note above) non-face-to-face time: obtaining and reviewing outside history, ordering and reviewing medications, tests or procedures, care coordination (communications with other health care professionals or caregivers) and documentation in the medical record.   I, Ok Edwards, am acting as a Neurosurgeon for Wyvonnia Lora, MD.  .I have reviewed the above documentation for accuracy and completeness, and I agree with the above. Johney Maine MD

## 2022-06-14 ENCOUNTER — Telehealth: Payer: Self-pay | Admitting: Hematology

## 2022-07-09 DIAGNOSIS — S93491A Sprain of other ligament of right ankle, initial encounter: Secondary | ICD-10-CM | POA: Diagnosis not present

## 2022-07-21 ENCOUNTER — Encounter: Payer: Self-pay | Admitting: Internal Medicine

## 2022-07-21 ENCOUNTER — Ambulatory Visit: Payer: Medicare PPO | Admitting: Internal Medicine

## 2022-07-21 VITALS — BP 112/64 | HR 81 | Temp 97.9°F | Ht 59.0 in | Wt 176.0 lb

## 2022-07-21 DIAGNOSIS — N184 Chronic kidney disease, stage 4 (severe): Secondary | ICD-10-CM

## 2022-07-21 DIAGNOSIS — E039 Hypothyroidism, unspecified: Secondary | ICD-10-CM

## 2022-07-21 DIAGNOSIS — Z0000 Encounter for general adult medical examination without abnormal findings: Secondary | ICD-10-CM | POA: Diagnosis not present

## 2022-07-21 DIAGNOSIS — R7303 Prediabetes: Secondary | ICD-10-CM

## 2022-07-21 DIAGNOSIS — I10 Essential (primary) hypertension: Secondary | ICD-10-CM | POA: Diagnosis not present

## 2022-07-21 LAB — LIPID PANEL
Cholesterol: 171 mg/dL (ref 0–200)
HDL: 42.9 mg/dL (ref >39.00)
NonHDL: 128.41
Total CHOL/HDL Ratio: 4
Triglycerides: 306 mg/dL — ABNORMAL HIGH (ref 0.0–149.0)
VLDL: 61.2 mg/dL — ABNORMAL HIGH (ref 0.0–40.0)

## 2022-07-21 LAB — LDL CHOLESTEROL, DIRECT: Direct LDL: 78 mg/dL

## 2022-07-21 LAB — TSH: TSH: 0.18 u[IU]/mL — ABNORMAL LOW (ref 0.35–5.50)

## 2022-07-21 LAB — HEMOGLOBIN A1C: Hgb A1c MFr Bld: 6 % (ref 4.6–6.5)

## 2022-07-21 NOTE — Assessment & Plan Note (Signed)
Checking HgA1c and adjust as needed.  

## 2022-07-21 NOTE — Assessment & Plan Note (Addendum)
Checking TSH and adjust synthroid 100 mcg daily as needed.  

## 2022-07-21 NOTE — Assessment & Plan Note (Signed)
Stable on recent labs. 

## 2022-07-21 NOTE — Patient Instructions (Signed)
We will check the labs today. Think about starting with water walking.

## 2022-07-21 NOTE — Assessment & Plan Note (Signed)
BMI 36 and complicated by essential hypertension and pre-diabetes.

## 2022-07-21 NOTE — Assessment & Plan Note (Signed)
Flu shot yearly. Pneumonia complete. Shingrix complete. Tetanus due 2029. Colonoscopy aged out. Mammogram aged out, pap smear aged out and dexa complete. Counseled about sun safety and mole surveillance. Counseled about the dangers of distracted driving. Given 10 year screening recommendations.

## 2022-07-21 NOTE — Assessment & Plan Note (Signed)
BP at goal on metoprolol 50 mg daily and hydrochlorothiazide 12.5 mg daily. Recent labs stable.

## 2022-07-21 NOTE — Progress Notes (Signed)
Subjective:   Patient ID: Lynn Ray, female    DOB: 08-22-1934, 88 y.o.   MRN: [id number]  HPI Here for medicare wellness and physical, no new complaints. Please see A/P for status and treatment of chronic medical problems.   Diet: heart healthy Physical activity: sedentary, recent fall wearing boot Depression/mood screen: negative Hearing: intact to whispered voice Visual acuity: grossly normal with lens, performs annual eye exam  ADLs: capable Fall risk: low Home safety: good Cognitive evaluation: intact to orientation, naming, recall and repetition EOL planning: adv directives discussed  Flowsheet Row Office Visit from 07/21/2022 in [facility] [place]  PHQ-2 Total Score 0       Flowsheet Row Office Visit from 07/21/2022 in [facility] [place]  PHQ-9 Total Score 0         10/22/2020    1:39 PM 08/16/2021    1:39 PM 11/25/2021    3:32 PM 04/18/2022    1:56 PM 07/21/2022    1:42 PM  Fall Risk  Falls in the past year? 0 0 0 0 1  Was there an injury with Fall? 0 0 0 0 1  Fall Risk Category Calculator 0 0 0 0 2  Fall Risk Category (Retired) Low Low Low    (RETIRED) Patient Fall Risk Level Low fall risk  Low fall risk    Patient at Risk for Falls Due to No Fall Risks  No Fall Risks    Fall risk Follow up Falls evaluation completed  Falls evaluation completed Falls evaluation completed     I have personally reviewed and have noted 1. The patient's medical and social history - reviewed today no changes 2. Their use of alcohol, tobacco or illicit drugs 3. Their current medications and supplements 4. The patient's functional ability including ADL's, fall risks, home safety risks and hearing or visual impairment. 5. Diet and physical activities 6. Evidence for depression or mood disorders 7. Care team reviewed and updated 8.  The patient is not on an opioid pain medication.  Patient Care Team: [name],  MD as PCP - General (Internal Medicine) [name], MD as Consulting Physician (Ophthalmology) Past Medical History:  Diagnosis Date   Arthritis    Cancer [facility]) 2004   rectal cancer   Cataract    right eye   Chiari malformation    CKD (chronic kidney disease), stage IV (HCC)    Depression    Diabetes mellitus (HCC)    "pre"   Hypertension    Sleep apnea    no CPAP   Thyroid disease    hyporthyroid   Past Surgical History:  Procedure Laterality Date   CATARACT EXTRACTION Bilateral    chiari malformation  2013   surgery   COLONOSCOPY     FLEXIBLE SIGMOIDOSCOPY     POLYPECTOMY     Family History  Problem Relation Age of Onset   Alzheimer's disease Mother    Stroke Mother        mini   Heart attack Mother    Hypertension Mother    Depression Other    Anemia Other    Obesity Other    Colon cancer Neg Hx    Esophageal cancer Neg Hx    Stomach cancer Neg Hx    Review of Systems  Constitutional: Negative.   HENT: Negative.    Eyes: Negative.   Respiratory:  Negative for cough, chest tightness and shortness of breath.  Cardiovascular:  Negative for chest pain, palpitations and leg swelling.  Gastrointestinal:  Negative for abdominal distention, abdominal pain, constipation, diarrhea, nausea and vomiting.  Musculoskeletal:  Positive for arthralgias.  Skin: Negative.   Psychiatric/Behavioral: Negative.      Objective:  Physical Exam Constitutional:      Appearance: She is well-developed. She is obese.  HENT:     Head: Normocephalic and atraumatic.  Cardiovascular:     Rate and Rhythm: Normal rate and regular rhythm.  Pulmonary:     Effort: Pulmonary effort is normal. No respiratory distress.     Breath sounds: Normal breath sounds. No wheezing or rales.  Abdominal:     General: Bowel sounds are normal. There is no distension.     Palpations: Abdomen is soft.     Tenderness: There is no abdominal tenderness. There is no rebound.  Musculoskeletal:         General: Tenderness present.     Cervical back: Normal range of motion.  Skin:    General: Skin is warm and dry.  Neurological:     Mental Status: She is alert and oriented to person, place, and time.     Coordination: Coordination normal.     Vitals:   07/21/22 1339  BP: 112/64  Pulse: 81  Temp: 97.9 F (36.6 C)  TempSrc: Oral  SpO2: 97%  Weight: 179 lb (81.2 kg)  Height: 4\' 11"  (1.499 m)    Assessment & Plan:

## 2022-07-28 DIAGNOSIS — S82454A Nondisplaced comminuted fracture of shaft of right fibula, initial encounter for closed fracture: Secondary | ICD-10-CM | POA: Diagnosis not present

## 2022-08-07 DIAGNOSIS — N184 Chronic kidney disease, stage 4 (severe): Secondary | ICD-10-CM | POA: Diagnosis not present

## 2022-08-10 DIAGNOSIS — H02052 Trichiasis without entropian right lower eyelid: Secondary | ICD-10-CM | POA: Diagnosis not present

## 2022-08-10 DIAGNOSIS — H04123 Dry eye syndrome of bilateral lacrimal glands: Secondary | ICD-10-CM | POA: Diagnosis not present

## 2022-08-10 DIAGNOSIS — H1045 Other chronic allergic conjunctivitis: Secondary | ICD-10-CM | POA: Diagnosis not present

## 2022-08-10 DIAGNOSIS — H02051 Trichiasis without entropian right upper eyelid: Secondary | ICD-10-CM | POA: Diagnosis not present

## 2022-08-15 DIAGNOSIS — E039 Hypothyroidism, unspecified: Secondary | ICD-10-CM | POA: Diagnosis not present

## 2022-08-15 DIAGNOSIS — D472 Monoclonal gammopathy: Secondary | ICD-10-CM | POA: Diagnosis not present

## 2022-08-15 DIAGNOSIS — R7303 Prediabetes: Secondary | ICD-10-CM | POA: Diagnosis not present

## 2022-08-15 DIAGNOSIS — N184 Chronic kidney disease, stage 4 (severe): Secondary | ICD-10-CM | POA: Diagnosis not present

## 2022-08-15 DIAGNOSIS — I129 Hypertensive chronic kidney disease with stage 1 through stage 4 chronic kidney disease, or unspecified chronic kidney disease: Secondary | ICD-10-CM | POA: Diagnosis not present

## 2022-08-18 DIAGNOSIS — S82454D Nondisplaced comminuted fracture of shaft of right fibula, subsequent encounter for closed fracture with routine healing: Secondary | ICD-10-CM | POA: Diagnosis not present

## 2022-08-28 ENCOUNTER — Other Ambulatory Visit: Payer: Self-pay | Admitting: Internal Medicine

## 2022-09-14 DIAGNOSIS — E119 Type 2 diabetes mellitus without complications: Secondary | ICD-10-CM | POA: Diagnosis not present

## 2022-09-14 DIAGNOSIS — Z961 Presence of intraocular lens: Secondary | ICD-10-CM | POA: Diagnosis not present

## 2022-09-14 DIAGNOSIS — H1045 Other chronic allergic conjunctivitis: Secondary | ICD-10-CM | POA: Diagnosis not present

## 2022-09-14 DIAGNOSIS — H04123 Dry eye syndrome of bilateral lacrimal glands: Secondary | ICD-10-CM | POA: Diagnosis not present

## 2022-09-14 LAB — HM DIABETES EYE EXAM

## 2022-10-17 ENCOUNTER — Encounter (HOSPITAL_COMMUNITY): Payer: Self-pay

## 2022-10-17 ENCOUNTER — Ambulatory Visit (HOSPITAL_COMMUNITY)
Admission: EM | Admit: 2022-10-17 | Discharge: 2022-10-17 | Disposition: A | Discharge status: Home / Self Care | Payer: Medicare PPO | Attending: Physician Assistant | Admitting: Physician Assistant

## 2022-10-17 ENCOUNTER — Ambulatory Visit (INDEPENDENT_AMBULATORY_CARE_PROVIDER_SITE_OTHER): Payer: Medicare PPO

## 2022-10-17 DIAGNOSIS — R197 Diarrhea, unspecified: Secondary | ICD-10-CM | POA: Diagnosis not present

## 2022-10-17 DIAGNOSIS — J441 Chronic obstructive pulmonary disease with (acute) exacerbation: Secondary | ICD-10-CM | POA: Diagnosis not present

## 2022-10-17 DIAGNOSIS — R059 Cough, unspecified: Secondary | ICD-10-CM | POA: Diagnosis not present

## 2022-10-17 DIAGNOSIS — J9811 Atelectasis: Secondary | ICD-10-CM | POA: Diagnosis not present

## 2022-10-17 MED ORDER — SPIRIVA RESPIMAT 2.5 MCG/ACT IN AERS: 2.0000 | INHALATION_SPRAY | Freq: Every day | RESPIRATORY_TRACT | 0 refills | Status: DC

## 2022-10-17 MED ORDER — AMOXICILLIN-POT CLAVULANATE 250-125 MG PO TABS: 1.0000 | ORAL_TABLET | Freq: Two times a day (BID) | ORAL | 0 refills | Status: DC

## 2022-10-17 MED ORDER — PREDNISONE 20 MG PO TABS: 20.0000 mg | ORAL_TABLET | Freq: Every day | ORAL | 0 refills | Status: AC

## 2022-10-17 NOTE — ED Triage Notes (Signed)
Patient here today with c/o nasal drainage, congestion, cough, SOB, wheeze, diarrhea, and lower abd pain X 2 weeks. She has been taking Mucinex, Nyquil, Dayquil, Tylenol, and a cough medicine with some relief. No sick contacts.

## 2022-10-17 NOTE — ED Provider Notes (Signed)
[facility]    CSN: [id number] Arrival date & time: 10/17/22  1233      History   Chief Complaint Chief Complaint  Patient presents with   Cough    HPI Lynn Ray is a 88 y.o. female.   Patient presents today with a 2-week history of worsening cough.  She reports initially she had a URI with some congestion and continues to have significant congestion that has worsened along with associated cough.  She reports thick purulent sputum.  She does report some occasional shortness of breath but denies any chest tightness or wheezing.  She denies any fever, nausea, vomiting.  Denies any known sick contacts.  She has been taking Mucinex and DayQuil/NyQuil without improvement of symptoms.  Denies any recent antibiotics or steroids.  She does have a history of COPD but does not take any medication for this.  She quit smoking approximately 15 years ago.  She is having difficulty with her daily activities as a result of symptoms.  In addition, she reports a 24-hour history of increasing diarrhea.  She denies any melena or hematochezia.  She initially had some lower abdominal pain but this has improved since she has had a bowel movement.  Denies any nausea or vomiting.  She is currently not experiencing any pain.  Denies any medication changes, recent travel, suspicious food intake.    Past Medical History:  Diagnosis Date   Arthritis    Cancer [facility]) 2004   rectal cancer   Cataract    right eye   Chiari malformation    CKD (chronic kidney disease), stage IV (HCC)    Depression    Diabetes mellitus (HCC)    "pre"   Hypertension    Sleep apnea    no CPAP   Thyroid disease    hyporthyroid    Patient Active Problem List   Diagnosis Date Noted   Vision changes 04/18/2022   Pre-diabetes 09/29/2020   CKD (chronic kidney disease) stage 4, GFR 15-29 ml/min (HCC) 12/22/2019   History of Chiari malformation 10/09/2017   Chronic right shoulder pain 10/09/2017   Routine general  medical examination at a health care facility 07/13/2017   History of malignant neoplasm of large intestine 11/18/2010   BARRETT'S ESOPHAGUS 01/14/2008   Hypothyroidism 12/10/2007   Morbid obesity (HCC) 12/10/2007   Essential hypertension 12/10/2007   AORTIC VALVE DISORDERS 12/10/2007   COPD 12/10/2007   GERD 12/09/2007    Past Surgical History:  Procedure Laterality Date   CATARACT EXTRACTION Bilateral    chiari malformation  2013   surgery   COLONOSCOPY     FLEXIBLE SIGMOIDOSCOPY     POLYPECTOMY      OB History   No obstetric history on file.      Home Medications    Prior to Admission medications   Medication Sig Start Date End Date Taking? Authorizing Provider  amoxicillin-clavulanate (AUGMENTIN) 250-125 MG tablet Take 1 tablet by mouth 2 (two) times daily. 10/17/22  Yes Shamarcus Hoheisel K, PA-C  predniSONE (DELTASONE) 20 MG tablet Take 1 tablet (20 mg total) by mouth daily for 4 days. 10/17/22 10/21/22 Yes Lynasia Meloche, Noberto Retort, PA-C  Tiotropium Bromide Monohydrate (SPIRIVA RESPIMAT) 2.5 MCG/ACT AERS Inhale 2 puffs into the lungs daily. 10/17/22  Yes Jalana Moore, Noberto Retort, PA-C  cholecalciferol (VITAMIN D3) 25 MCG (1000 UT) tablet Take 1,000 Units by mouth daily.    [provider]  hydrochlorothiazide (HYDRODIURIL) 25 MG tablet Take 0.5 tablets (12.5 mg total)  by mouth daily. 12/01/20   Johney Maine, MD  levothyroxine (SYNTHROID) 100 MCG tablet TAKE 1 TABLET (100 MCG TOTAL) BY MOUTH DAILY. FOLLOW-UP DUE IN [place] WITH LABS 08/29/22   [name], MD  metoprolol succinate (TOPROL-XL) 50 MG 24 hr tablet TAKE 1 TABLET BY MOUTH EVERY DAY 01/05/20   [name], MD  vitamin B-12 (CYANOCOBALAMIN) 100 MCG tablet Take 100 mcg by mouth daily.    [provider]    Family History Family History  Problem Relation Age of Onset   Alzheimer's disease Mother    Stroke Mother        mini   Heart attack Mother    Hypertension Mother    Depression Other     Anemia Other    Obesity Other    Colon cancer Neg Hx    Esophageal cancer Neg Hx    Stomach cancer Neg Hx     Social History Social History   Tobacco Use   Smoking status: Former    Current packs/day: 0.25    Types: Cigarettes   Smokeless tobacco: Never   Tobacco comments:    04/2013,quit  Vaping Use   Vaping status: Never Used  Substance Use Topics   Alcohol use: No    Alcohol/week: 0.0 standard drinks of alcohol   Drug use: No     Allergies   Aspirin and Tape   Review of Systems Review of Systems  Constitutional:  Positive for activity change. Negative for appetite change, fatigue and fever.  HENT:  Positive for congestion. Negative for sinus pressure, sneezing and sore throat.   Respiratory:  Positive for cough and shortness of breath.   Cardiovascular:  Negative for chest pain.  Gastrointestinal:  Positive for diarrhea. Negative for abdominal pain, nausea and vomiting.  Neurological:  Negative for dizziness, light-headedness and headaches.     Physical Exam Triage Vital Signs ED Triage Vitals  Encounter Vitals Group     BP 10/17/22 1303 (!) 143/76     Systolic BP Percentile --      Diastolic BP Percentile --      Pulse Rate 10/17/22 1303 83     Resp 10/17/22 1303 16     Temp 10/17/22 1303 98 F (36.7 C)     Temp Source 10/17/22 1303 Oral     SpO2 10/17/22 1303 98 %     Weight 10/17/22 1303 175 lb (79.4 kg)     Height 10/17/22 1303 4' 11.5" (1.511 m)     Head Circumference --      Peak Flow --      Pain Score 10/17/22 1302 7     Pain Loc --      Pain Education --      Exclude from Growth Chart --    No data found.  Updated Vital Signs BP (!) 143/76 (BP Location: Left Arm)   Pulse 83   Temp 98 F (36.7 C) (Oral)   Resp 16   Ht 4' 11.5" (1.511 m)   Wt 175 lb (79.4 kg)   SpO2 98%   BMI 34.75 kg/m   Visual Acuity Right Eye Distance:   Left Eye Distance:   Bilateral Distance:    Right Eye Near:   Left Eye Near:    Bilateral Near:      Physical Exam Vitals reviewed.  Constitutional:      General: She is awake. She is not in acute distress.    Appearance: Normal appearance. She  is well-developed. She is not ill-appearing.     Comments: Very pleasant female appears stated age in no acute distress sitting comfortably exam room  HENT:     Head: Normocephalic and atraumatic.     Right Ear: Tympanic membrane, ear canal and external ear normal. Tympanic membrane is not erythematous or bulging.     Left Ear: Tympanic membrane, ear canal and external ear normal. Tympanic membrane is not erythematous or bulging.     Nose:     Right Sinus: No maxillary sinus tenderness or frontal sinus tenderness.     Left Sinus: No maxillary sinus tenderness or frontal sinus tenderness.     Mouth/Throat:     Dentition: Has dentures.     Pharynx: Uvula midline. No oropharyngeal exudate or posterior oropharyngeal erythema.  Cardiovascular:     Rate and Rhythm: Normal rate and regular rhythm.     Heart sounds: Normal heart sounds, S1 normal and S2 normal. No murmur heard. Pulmonary:     Effort: Pulmonary effort is normal.     Breath sounds: Examination of the right-lower field reveals decreased breath sounds. Examination of the left-lower field reveals decreased breath sounds. Decreased breath sounds present. No wheezing, rhonchi or rales.  Abdominal:     General: Bowel sounds are normal.     Palpations: Abdomen is soft.     Tenderness: There is no abdominal tenderness. There is no right CVA tenderness, left CVA tenderness, guarding or rebound.  Psychiatric:        Behavior: Behavior is cooperative.      UC Treatments / Results  Labs (all labs ordered are listed, but only abnormal results are displayed) Labs Reviewed - No data to display  EKG   Radiology DG Chest 2 View  Result Date: 10/17/2022 CLINICAL DATA:  Cough. EXAM: CHEST - 2 VIEW COMPARISON:  None Available. FINDINGS: The heart size and mediastinal contours are within  normal limits. Mild atelectasis at the right lung base. There is no evidence of pulmonary edema, consolidation, pneumothorax, nodule or pleural fluid. The visualized skeletal structures are unremarkable. IMPRESSION: Mild atelectasis at the right lung base. Electronically Signed   By: Irish Lack M.D.   On: 10/17/2022 15:21    Procedures Procedures (including critical care time)  Medications Ordered in UC Medications - No data to display  Initial Impression / Assessment and Plan / UC Course  I have reviewed the triage vital signs and the nursing notes.  Pertinent labs & imaging results that were available during my care of the patient were reviewed by me and considered in my medical decision making (see chart for details).     Patient is well-appearing, afebrile, nontoxic, nontachycardic.  X-ray was obtained given her history of COPD with worsening cough which showed atelectasis without evidence of consolidation.  She was given incentive spirometer with instruction on how to use this to address the atelectasis.  Given her worsening cough concern for COPD exacerbation and she was started on Augmentin twice daily for 7 days.  She does have a history of chronic any disease and her last metabolic panel obtained 06/06/2022 showed creatinine of 2.11 with a calculated creatinine clearance of 24.43 mL/min.  I do think she would benefit most from Augmentin as this will cover for both COPD exacerbation and diverticulitis (see below).  So this was sent to her pharmacy but dose adjusted based on her most recent metabolic panel.  She was started on Spiriva to help with her breathing as well as  a short course of prednisone 20 mg daily for 4 days.  Discussed that she is not to take NSAIDs with this medication.  She can continue over-the-counter medications as needed.  Recommend she follow-up closely with her PCP later this week or early next week.  If at any point she has worsening symptoms she needs to go to the  emergency room to which she expressed understanding.  I am concerned that her diarrhea is related to diverticulitis.  She reports a history of this and her colonoscopy obtained April 2022 did show diverticulosis.  Discussed that Augmentin should treat both of these conditions.  She does not have any significant tenderness on exam and her vitals are stable.  We discussed that if she has any worsening or changing symptoms she would need to go to the emergency room for imaging since we do not have these capabilities in urgent care.  Low suspicion for mesenteric ischemia given she does not have significant pain while in clinic today.  We discussed at length alarm symptoms that would warrant going to the ER to which she expressed understanding.  Recommended close follow-up with her PCP.  Final Clinical Impressions(s) / UC Diagnoses   Final diagnoses:  COPD exacerbation (HCC)  Diarrhea, unspecified type     Discharge Instructions      Your x-ray showed that one of your lungs is not completely inflated but there was no evidence of pneumonia.  I am concerned that you might have a COPD exacerbation.  Start Augmentin twice daily.  Start prednisone 20 mg for 4 days.  Do not take NSAIDs with this medication including aspirin, ibuprofen/Advil, naproxen/Aleve.  Start Spiriva daily.  Eat a bland diet and drink plenty of fluid.  Follow-up with your primary care first thing next week.  Use the incentive spirometer as we discussed.  If you have any worsening symptoms including worsening abdominal pain, recurrent diarrhea, chest pain, shortness of breath, nausea, vomiting, worsening cough you need to be seen immediately.     ED Prescriptions     Medication Sig Dispense Auth. Provider   amoxicillin-clavulanate (AUGMENTIN) 250-125 MG tablet Take 1 tablet by mouth 2 (two) times daily. 14 tablet Worthington Cruzan K, PA-C   Tiotropium Bromide Monohydrate (SPIRIVA RESPIMAT) 2.5 MCG/ACT AERS Inhale 2 puffs into the lungs  daily. 4 g Compton Brigance K, PA-C   predniSONE (DELTASONE) 20 MG tablet Take 1 tablet (20 mg total) by mouth daily for 4 days. 4 tablet Keondrick Dilks, Noberto Retort, PA-C      PDMP not reviewed this encounter.   Jeani Hawking, PA-C 10/17/22 1550

## 2022-10-17 NOTE — Discharge Instructions (Signed)
Your x-ray showed that one of your lungs is not completely inflated but there was no evidence of pneumonia.  I am concerned that you might have a COPD exacerbation.  Start Augmentin twice daily.  Start prednisone 20 mg for 4 days.  Do not take NSAIDs with this medication including aspirin, ibuprofen/Advil, naproxen/Aleve.  Start Spiriva daily.  Eat a bland diet and drink plenty of fluid.  Follow-up with your primary care first thing next week.  Use the incentive spirometer as we discussed.  If you have any worsening symptoms including worsening abdominal pain, recurrent diarrhea, chest pain, shortness of breath, nausea, vomiting, worsening cough you need to be seen immediately.

## 2022-11-28 ENCOUNTER — Other Ambulatory Visit: Payer: Self-pay | Admitting: Internal Medicine

## 2022-12-01 ENCOUNTER — Other Ambulatory Visit: Payer: Self-pay

## 2022-12-01 DIAGNOSIS — D472 Monoclonal gammopathy: Secondary | ICD-10-CM

## 2022-12-04 ENCOUNTER — Inpatient Hospital Stay: Payer: Medicare PPO | Attending: Hematology

## 2022-12-04 DIAGNOSIS — G4730 Sleep apnea, unspecified: Secondary | ICD-10-CM | POA: Diagnosis not present

## 2022-12-04 DIAGNOSIS — Z85048 Personal history of other malignant neoplasm of rectum, rectosigmoid junction, and anus: Secondary | ICD-10-CM | POA: Diagnosis not present

## 2022-12-04 DIAGNOSIS — D472 Monoclonal gammopathy: Secondary | ICD-10-CM | POA: Diagnosis not present

## 2022-12-04 DIAGNOSIS — Z87891 Personal history of nicotine dependence: Secondary | ICD-10-CM | POA: Insufficient documentation

## 2022-12-04 DIAGNOSIS — E039 Hypothyroidism, unspecified: Secondary | ICD-10-CM | POA: Insufficient documentation

## 2022-12-04 DIAGNOSIS — M129 Arthropathy, unspecified: Secondary | ICD-10-CM | POA: Insufficient documentation

## 2022-12-04 DIAGNOSIS — Z79899 Other long term (current) drug therapy: Secondary | ICD-10-CM | POA: Diagnosis not present

## 2022-12-04 DIAGNOSIS — R350 Frequency of micturition: Secondary | ICD-10-CM | POA: Diagnosis not present

## 2022-12-04 DIAGNOSIS — I129 Hypertensive chronic kidney disease with stage 1 through stage 4 chronic kidney disease, or unspecified chronic kidney disease: Secondary | ICD-10-CM | POA: Diagnosis not present

## 2022-12-04 DIAGNOSIS — E1136 Type 2 diabetes mellitus with diabetic cataract: Secondary | ICD-10-CM | POA: Diagnosis not present

## 2022-12-04 DIAGNOSIS — E1122 Type 2 diabetes mellitus with diabetic chronic kidney disease: Secondary | ICD-10-CM | POA: Insufficient documentation

## 2022-12-04 DIAGNOSIS — G935 Compression of brain: Secondary | ICD-10-CM | POA: Diagnosis not present

## 2022-12-04 DIAGNOSIS — M2550 Pain in unspecified joint: Secondary | ICD-10-CM | POA: Diagnosis not present

## 2022-12-04 LAB — CMP (CANCER CENTER ONLY)
ALT: 8 U/L (ref 0–44)
AST: 15 U/L (ref 15–41)
Albumin: 3.9 g/dL (ref 3.5–5.0)
Alkaline Phosphatase: 96 U/L (ref 38–126)
Anion gap: 6 (ref 5–15)
BUN: 30 mg/dL — ABNORMAL HIGH (ref 8–23)
CO2: 25 mmol/L (ref 22–32)
Calcium: 9.9 mg/dL (ref 8.9–10.3)
Chloride: 108 mmol/L (ref 98–111)
Creatinine: 2.25 mg/dL — ABNORMAL HIGH (ref 0.44–1.00)
GFR, Estimated: 21 mL/min — ABNORMAL LOW (ref >60)
Glucose, Bld: 82 mg/dL (ref 70–99)
Potassium: 4.1 mmol/L (ref 3.5–5.1)
Sodium: 139 mmol/L (ref 135–145)
Total Bilirubin: 0.2 mg/dL (ref <1.2)
Total Protein: 7.4 g/dL (ref 6.5–8.1)

## 2022-12-04 LAB — CBC WITH DIFFERENTIAL (CANCER CENTER ONLY)
Abs Immature Granulocytes: 0.01 10*3/uL (ref 0.00–0.07)
Basophils Absolute: 0 10*3/uL (ref 0.0–0.1)
Basophils Relative: 1 %
Eosinophils Absolute: 0.3 10*3/uL (ref 0.0–0.5)
Eosinophils Relative: 5 %
HCT: 32.5 % — ABNORMAL LOW (ref 36.0–46.0)
Hemoglobin: 10.3 g/dL — ABNORMAL LOW (ref 12.0–15.0)
Immature Granulocytes: 0 %
Lymphocytes Relative: 31 %
Lymphs Abs: 1.9 10*3/uL (ref 0.7–4.0)
MCH: 27.9 pg (ref 26.0–34.0)
MCHC: 31.7 g/dL (ref 30.0–36.0)
MCV: 88.1 fL (ref 80.0–100.0)
Monocytes Absolute: 0.4 10*3/uL (ref 0.1–1.0)
Monocytes Relative: 7 %
Neutro Abs: 3.4 10*3/uL (ref 1.7–7.7)
Neutrophils Relative %: 56 %
Platelet Count: 248 10*3/uL (ref 150–400)
RBC: 3.69 MIL/uL — ABNORMAL LOW (ref 3.87–5.11)
RDW: 13.4 % (ref 11.5–15.5)
WBC Count: 6 10*3/uL (ref 4.0–10.5)
nRBC: 0 % (ref 0.0–0.2)

## 2022-12-05 LAB — KAPPA/LAMBDA LIGHT CHAINS
Kappa free light chain: 92.3 mg/L — ABNORMAL HIGH (ref 3.3–19.4)
Kappa, lambda light chain ratio: 2.91 — ABNORMAL HIGH (ref 0.26–1.65)
Lambda free light chains: 31.7 mg/L — ABNORMAL HIGH (ref 5.7–26.3)

## 2022-12-07 LAB — MULTIPLE MYELOMA PANEL, SERUM
Albumin SerPl Elph-Mcnc: 3.4 g/dL (ref 2.9–4.4)
Albumin/Glob SerPl: 1 (ref 0.7–1.7)
Alpha 1: 0.2 g/dL (ref 0.0–0.4)
Alpha2 Glob SerPl Elph-Mcnc: 0.9 g/dL (ref 0.4–1.0)
B-Globulin SerPl Elph-Mcnc: 1.2 g/dL (ref 0.7–1.3)
Gamma Glob SerPl Elph-Mcnc: 1.2 g/dL (ref 0.4–1.8)
Globulin, Total: 3.5 g/dL (ref 2.2–3.9)
IgA: 305 mg/dL (ref 64–422)
IgG (Immunoglobin G), Serum: 1435 mg/dL (ref 586–1602)
IgM (Immunoglobulin M), Srm: 37 mg/dL (ref 26–217)
M Protein SerPl Elph-Mcnc: 0.3 g/dL — ABNORMAL HIGH
Total Protein ELP: 6.9 g/dL (ref 6.0–8.5)

## 2022-12-11 ENCOUNTER — Inpatient Hospital Stay (HOSPITAL_BASED_OUTPATIENT_CLINIC_OR_DEPARTMENT_OTHER): Payer: Medicare PPO | Admitting: Hematology

## 2022-12-11 DIAGNOSIS — M2550 Pain in unspecified joint: Secondary | ICD-10-CM | POA: Diagnosis not present

## 2022-12-11 DIAGNOSIS — D472 Monoclonal gammopathy: Secondary | ICD-10-CM | POA: Diagnosis not present

## 2022-12-11 DIAGNOSIS — E1122 Type 2 diabetes mellitus with diabetic chronic kidney disease: Secondary | ICD-10-CM | POA: Diagnosis not present

## 2022-12-11 DIAGNOSIS — G935 Compression of brain: Secondary | ICD-10-CM | POA: Diagnosis not present

## 2022-12-11 DIAGNOSIS — I129 Hypertensive chronic kidney disease with stage 1 through stage 4 chronic kidney disease, or unspecified chronic kidney disease: Secondary | ICD-10-CM | POA: Diagnosis not present

## 2022-12-11 DIAGNOSIS — E1136 Type 2 diabetes mellitus with diabetic cataract: Secondary | ICD-10-CM | POA: Diagnosis not present

## 2022-12-11 DIAGNOSIS — R350 Frequency of micturition: Secondary | ICD-10-CM | POA: Diagnosis not present

## 2022-12-11 DIAGNOSIS — M129 Arthropathy, unspecified: Secondary | ICD-10-CM | POA: Diagnosis not present

## 2022-12-11 DIAGNOSIS — G4730 Sleep apnea, unspecified: Secondary | ICD-10-CM | POA: Diagnosis not present

## 2022-12-11 NOTE — Progress Notes (Signed)
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  .I connected with Lynn Ray on 12/11/2022 at  3:30 PM EST by telephone visit and verified that I am speaking with the correct person using two identifiers.   I last connected with the patient on 06/13/2022 and she complains of back pain and worsened arthritis.   Patient notes she has been doing well overall since our last visit. She complains of hip pain and back pain due to arthritis. She also complains of frequent urination since our last visit.   She denies any new infection issues, fever, chills, night sweats, unexpected weight loss, abdominal pain, chest pain, or leg swelling.   Patient notes she has been staying well-hydrated and has been eating well.   Patient is currently controlling her diabetes with her diet. She is not taking any medication for diabetes.   Patient regularly follows-up with her PCP, Dr. Okey Ray, every 6 months.   Discussed lab results from 12/04/2022 in detail with the patient.   I discussed the limitations, risks, security and privacy concerns of performing an evaluation and management service by telemedicine and the availability of in-person appointments. I also discussed with the patient that there may be a patient responsible charge related to this service. The patient expressed understanding and agreed to proceed.   Other persons participating in the visit and their role in the encounter: None    Patient's location: Home  Provider's location: [facility]   Chief Complaint: MGUS   MEDICAL HISTORY:  Past Medical History:  Diagnosis Date   Arthritis    Cancer (HCC) 2004   rectal cancer   Cataract    right eye   Chiari malformation    CKD (chronic kidney disease), stage IV (HCC)    Depression    Diabetes mellitus (HCC)    "pre"   Hypertension    Sleep apnea    no CPAP   Thyroid disease    hyporthyroid    SURGICAL HISTORY: Past Surgical History:  Procedure Laterality Date   CATARACT EXTRACTION Bilateral    chiari malformation  2013   surgery   COLONOSCOPY     FLEXIBLE SIGMOIDOSCOPY     POLYPECTOMY      SOCIAL HISTORY: Social History   Socioeconomic History   Marital status: Widowed    Spouse name: Lynn Ray   Number of children: 2   Years of education: Masters   Highest education level: Not on file  Occupational History   Occupation: retired    Comment: retired  Tobacco Use   Smoking status: Former    Current packs/day: 0.25    Types: Cigarettes   Smokeless tobacco: Never   Tobacco comments:    04/2013,quit  Vaping Use   Vaping status: Never Used  Substance and Sexual Activity   Alcohol use: No    Alcohol/week: 0.0 standard drinks of alcohol   Drug use: No   Sexual activity: Not Currently  Other Topics Concern   Not on file  Social History Narrative   Patient consumes one  cup of coffee daily   Social Determinants of Health   Financial Resource Strain: Low Risk  (10/22/2020)   Overall Financial Resource Strain (CARDIA)    Difficulty of Paying Living Expenses: Not hard at all  Food Insecurity: No Food Insecurity (10/22/2020)   Hunger Vital Sign    Worried About Running Out of Food in the Last Year: Never true    Ran Out of Food in the Last Year: Never true  Transportation Needs: No Transportation Needs (10/22/2020)   PRAPARE - Administrator, Civil Service (Medical): No    Lack of Transportation (Non-Medical): No  Physical Activity:  Inactive (10/22/2020)   Exercise Vital Sign    Days of Exercise per Week: 0 days    Minutes of Exercise per Session: 0 min  Stress: No Stress Concern Present (10/22/2020)   [employer] of Occupational Health - Occupational Stress Questionnaire    Feeling of Stress : Not at all  Social Connections: Socially Integrated (10/22/2020)   Social Connection and Isolation Panel [NHANES]    Frequency of Communication with Friends and Family: More than three times a week    Frequency of Social Gatherings with Friends and Family: Once a week    Attends Religious Services: More than 4 times per year    Active Member of [employer] or Organizations: Yes    Attends Engineer, structural: More than 4 times per year    Marital Status: Married  Catering manager Violence: Not At Risk (10/22/2020)   Humiliation, Afraid, Rape, and Kick questionnaire    Fear of Current or Ex-Partner: No    Emotionally Abused: No    Physically Abused: No    Sexually Abused: No    FAMILY HISTORY: Family History  Problem Relation Age of Onset   Alzheimer's disease Mother    Stroke Mother        mini   Heart attack Mother    Hypertension Mother    Depression Other    Anemia Other    Obesity Other    Colon cancer Neg Hx    Esophageal cancer Neg Hx    Stomach cancer Neg Hx     ALLERGIES:  is allergic to aspirin and tape.  MEDICATIONS:  Current Outpatient Medications  Medication Sig Dispense Refill   amoxicillin-clavulanate (AUGMENTIN) 250-125 MG tablet Take 1 tablet by mouth 2 (two) times daily. 14 tablet 0   cholecalciferol (VITAMIN D3) 25 MCG (1000 UT) tablet Take 1,000 Units by mouth daily.     hydrochlorothiazide (HYDRODIURIL) 25 MG tablet Take 0.5 tablets (12.5 mg total) by mouth daily.     levothyroxine (SYNTHROID) 100 MCG tablet TAKE 1 TABLET (100 MCG TOTAL) BY MOUTH DAILY. FOLLOW-UP DUE IN JUNE WITH LABS 90 tablet 0   metoprolol succinate (TOPROL-XL) 50 MG 24 hr tablet TAKE 1 TABLET BY MOUTH EVERY DAY  90 tablet 3   Tiotropium Bromide Monohydrate (SPIRIVA RESPIMAT) 2.5 MCG/ACT AERS Inhale 2 puffs into the lungs daily. 4 g 0   vitamin B-12 (CYANOCOBALAMIN) 100 MCG tablet Take 100 mcg by mouth daily.     No current facility-administered medications for this visit.    REVIEW OF SYSTEMS:   10 Point review of Systems was done is negative except as noted above.   PHYSICAL EXAMINATION: TELE-MED VISIT  LABORATORY DATA:  I have reviewed the data as listed  .    Latest Ref Rng & Units 12/04/2022    2:21 PM 06/06/2022    1:56 PM  11/28/2021    1:59 PM  CBC  WBC 4.0 - 10.5 K/uL 6.0  5.6  4.9   Hemoglobin 12.0 - 15.0 g/dL 74.2  59.5  63.8   Hematocrit 36.0 - 46.0 % 32.5  33.7  35.5   Platelets 150 - 400 K/uL 248  263  303     .    Latest Ref Rng & Units 12/04/2022    2:21 PM 06/06/2022    1:56 PM 02/14/2022   12:00 AM  CMP  Glucose 70 - 99 mg/dL 82  76    BUN 8 - 23 mg/dL 30  27  21       Creatinine 0.44 - 1.00 mg/dL 7.56  4.33  2.0      Sodium 135 - 145 mmol/L 139  137  138      Potassium 3.5 - 5.1 mmol/L 4.1  4.0  4.5      Chloride 98 - 111 mmol/L 108  106  107      CO2 22 - 32 mmol/L 25  25    Calcium 8.9 - 10.3 mg/dL 9.9  9.7  29.5      Total Protein 6.5 - 8.1 g/dL 7.4  7.3    Total Bilirubin <1.2 mg/dL 0.2  0.2    Alkaline Phos 38 - 126 U/L 96  92    AST 15 - 41 U/L 15  14    ALT 0 - 44 U/L 8  6       This result is from an external source.       RADIOGRAPHIC STUDIES: I have personally reviewed the radiological images as listed and agreed with the findings in the report. No results found.   ASSESSMENT & PLAN:   88 yo with   1) Monoclonal Paraproteinemia - likely MGUS DG Bone Survey Met ([id number]) on 05/10/2020; no signs of bone tumors. Arthritic changes in right and left shoulder joints.  PLAN: -Discussed lab results from 12/04/2022 in detail with the patient. CBC shows patient is anemic with hemoglobin of 10.3 g/dL and hematocrit of 01.6%. CMP shows elevated  BUN of 30 and elevated creatinine of 2.25.  -Discussed Multiple myeloma panel results from 12/04/2022 in detail with the patient. Showed stable M-protein of 0.3, unchanged from 6 months and 1 year ago.   -Discussed Kappa/Lambda light chain results from 12/04/2022 in detail. Showed elevated but improved Kappa/Lambda light chain ratio of 2.90 and elevated Kappa free light chain of 92.3.  -Discussed with the patient that anemia is not related to MGUS and it is most likely related to chronic kidney disease.  -Patient has no clinical or lab evidence of progression to active multiple myeloma at this time. -Continue follow-up with nephrology for management of chronic kidney disease -Answered patient's questions in detail.  -Educated the patient on multiple myeloma and MGUS.  -RTC with Dr. Candise Che with labs in 1 year since labs are stable.   FOLLOW-UP: RTC with Dr. Candise Che in 1 year.  Labs one week prior.   The total time spent in the appointment was 20 minutes* .  All of the patient's questions were answered with apparent satisfaction. The patient knows to call the clinic with any problems, questions or concerns.   Wyvonnia Lora MD MS AAHIVMS [facility] Hematology/Oncology Physician [facility]  .*Total Encounter Time as defined by the Centers for Medicare and Medicaid Services includes, in addition to the face-to-face time of a patient visit (documented in the note above) non-face-to-face  time: obtaining and reviewing outside history, ordering and reviewing medications, tests or procedures, care coordination (communications with other health care professionals or caregivers) and documentation in the medical record.   I,Param Shah,acting as a Neurosurgeon for Wyvonnia Lora, MD.,have documented all relevant documentation on the behalf of Wyvonnia Lora, MD,as directed by  Wyvonnia Lora, MD while in the presence of Wyvonnia Lora, MD.  .I have reviewed the above documentation for accuracy and completeness, and  I agree with the above. Johney Maine MD

## 2023-02-06 DIAGNOSIS — I10 Essential (primary) hypertension: Secondary | ICD-10-CM | POA: Diagnosis not present

## 2023-02-09 ENCOUNTER — Ambulatory Visit: Payer: Medicare PPO | Admitting: Internal Medicine

## 2023-02-12 ENCOUNTER — Ambulatory Visit: Payer: Medicare PPO | Admitting: Internal Medicine

## 2023-02-21 ENCOUNTER — Ambulatory Visit: Payer: Medicare PPO | Admitting: Internal Medicine

## 2023-02-22 ENCOUNTER — Other Ambulatory Visit: Payer: Self-pay

## 2023-02-22 ENCOUNTER — Encounter (HOSPITAL_COMMUNITY): Payer: Self-pay

## 2023-02-22 ENCOUNTER — Inpatient Hospital Stay (HOSPITAL_COMMUNITY)
Admission: EM | Admit: 2023-02-22 | Discharge: 2023-02-28 | DRG: 065 | Disposition: A | Discharge status: Rehabilitation Facility | Payer: Medicare PPO | Attending: Internal Medicine | Admitting: Internal Medicine

## 2023-02-22 DIAGNOSIS — Z87891 Personal history of nicotine dependence: Secondary | ICD-10-CM | POA: Diagnosis not present

## 2023-02-22 DIAGNOSIS — Z91048 Other nonmedicinal substance allergy status: Secondary | ICD-10-CM | POA: Diagnosis not present

## 2023-02-22 DIAGNOSIS — R1310 Dysphagia, unspecified: Secondary | ICD-10-CM | POA: Diagnosis present

## 2023-02-22 DIAGNOSIS — E039 Hypothyroidism, unspecified: Secondary | ICD-10-CM | POA: Diagnosis present

## 2023-02-22 DIAGNOSIS — N184 Chronic kidney disease, stage 4 (severe): Secondary | ICD-10-CM | POA: Diagnosis present

## 2023-02-22 DIAGNOSIS — Z7982 Long term (current) use of aspirin: Secondary | ICD-10-CM

## 2023-02-22 DIAGNOSIS — R4702 Dysphasia: Secondary | ICD-10-CM | POA: Diagnosis present

## 2023-02-22 DIAGNOSIS — I671 Cerebral aneurysm, nonruptured: Secondary | ICD-10-CM | POA: Diagnosis present

## 2023-02-22 DIAGNOSIS — R4701 Aphasia: Secondary | ICD-10-CM | POA: Diagnosis present

## 2023-02-22 DIAGNOSIS — J449 Chronic obstructive pulmonary disease, unspecified: Secondary | ICD-10-CM | POA: Diagnosis not present

## 2023-02-22 DIAGNOSIS — Z820 Family history of epilepsy and other diseases of the nervous system: Secondary | ICD-10-CM

## 2023-02-22 DIAGNOSIS — Z79890 Hormone replacement therapy (postmenopausal): Secondary | ICD-10-CM

## 2023-02-22 DIAGNOSIS — Z7409 Other reduced mobility: Secondary | ICD-10-CM | POA: Diagnosis present

## 2023-02-22 DIAGNOSIS — Z8249 Family history of ischemic heart disease and other diseases of the circulatory system: Secondary | ICD-10-CM

## 2023-02-22 DIAGNOSIS — E1122 Type 2 diabetes mellitus with diabetic chronic kidney disease: Secondary | ICD-10-CM | POA: Diagnosis not present

## 2023-02-22 DIAGNOSIS — F32A Depression, unspecified: Secondary | ICD-10-CM | POA: Diagnosis not present

## 2023-02-22 DIAGNOSIS — E785 Hyperlipidemia, unspecified: Secondary | ICD-10-CM | POA: Diagnosis present

## 2023-02-22 DIAGNOSIS — I69391 Dysphagia following cerebral infarction: Secondary | ICD-10-CM | POA: Diagnosis not present

## 2023-02-22 DIAGNOSIS — I129 Hypertensive chronic kidney disease with stage 1 through stage 4 chronic kidney disease, or unspecified chronic kidney disease: Secondary | ICD-10-CM | POA: Diagnosis present

## 2023-02-22 DIAGNOSIS — Z7902 Long term (current) use of antithrombotics/antiplatelets: Secondary | ICD-10-CM

## 2023-02-22 DIAGNOSIS — I6381 Other cerebral infarction due to occlusion or stenosis of small artery: Principal | ICD-10-CM | POA: Diagnosis present

## 2023-02-22 DIAGNOSIS — R29810 Facial weakness: Secondary | ICD-10-CM | POA: Diagnosis present

## 2023-02-22 DIAGNOSIS — I63133 Cerebral infarction due to embolism of bilateral carotid arteries: Secondary | ICD-10-CM | POA: Diagnosis not present

## 2023-02-22 DIAGNOSIS — Z886 Allergy status to analgesic agent status: Secondary | ICD-10-CM | POA: Diagnosis not present

## 2023-02-22 DIAGNOSIS — I10 Essential (primary) hypertension: Secondary | ICD-10-CM | POA: Diagnosis present

## 2023-02-22 DIAGNOSIS — D649 Anemia, unspecified: Secondary | ICD-10-CM | POA: Diagnosis not present

## 2023-02-22 DIAGNOSIS — D472 Monoclonal gammopathy: Secondary | ICD-10-CM | POA: Diagnosis not present

## 2023-02-22 DIAGNOSIS — Z6834 Body mass index (BMI) 34.0-34.9, adult: Secondary | ICD-10-CM

## 2023-02-22 DIAGNOSIS — R29818 Other symptoms and signs involving the nervous system: Secondary | ICD-10-CM | POA: Diagnosis not present

## 2023-02-22 DIAGNOSIS — I639 Cerebral infarction, unspecified: Secondary | ICD-10-CM | POA: Diagnosis not present

## 2023-02-22 DIAGNOSIS — E890 Postprocedural hypothyroidism: Secondary | ICD-10-CM | POA: Diagnosis present

## 2023-02-22 DIAGNOSIS — I6389 Other cerebral infarction: Secondary | ICD-10-CM | POA: Diagnosis not present

## 2023-02-22 DIAGNOSIS — Z85048 Personal history of other malignant neoplasm of rectum, rectosigmoid junction, and anus: Secondary | ICD-10-CM

## 2023-02-22 DIAGNOSIS — R29702 NIHSS score 2: Secondary | ICD-10-CM | POA: Diagnosis present

## 2023-02-22 DIAGNOSIS — Z823 Family history of stroke: Secondary | ICD-10-CM

## 2023-02-22 DIAGNOSIS — Z23 Encounter for immunization: Secondary | ICD-10-CM | POA: Diagnosis not present

## 2023-02-22 DIAGNOSIS — Z79899 Other long term (current) drug therapy: Secondary | ICD-10-CM

## 2023-02-22 DIAGNOSIS — D631 Anemia in chronic kidney disease: Secondary | ICD-10-CM | POA: Diagnosis not present

## 2023-02-22 DIAGNOSIS — Z818 Family history of other mental and behavioral disorders: Secondary | ICD-10-CM | POA: Diagnosis not present

## 2023-02-22 DIAGNOSIS — I69351 Hemiplegia and hemiparesis following cerebral infarction affecting right dominant side: Secondary | ICD-10-CM | POA: Diagnosis not present

## 2023-02-22 DIAGNOSIS — I63131 Cerebral infarction due to embolism of right carotid artery: Secondary | ICD-10-CM | POA: Diagnosis not present

## 2023-02-22 LAB — CBC
HCT: 33.8 % — ABNORMAL LOW (ref 36.0–46.0)
Hemoglobin: 10.5 g/dL — ABNORMAL LOW (ref 12.0–15.0)
MCH: 28.2 pg (ref 26.0–34.0)
MCHC: 31.1 g/dL (ref 30.0–36.0)
MCV: 90.9 fL (ref 80.0–100.0)
Platelets: 234 10*3/uL (ref 150–400)
RBC: 3.72 MIL/uL — ABNORMAL LOW (ref 3.87–5.11)
RDW: 13.4 % (ref 11.5–15.5)
WBC: 7.3 10*3/uL (ref 4.0–10.5)
nRBC: 0 % (ref 0.0–0.2)

## 2023-02-22 LAB — I-STAT CHEM 8, ED
BUN: 25 mg/dL — ABNORMAL HIGH (ref 8–23)
Calcium, Ion: 1.31 mmol/L (ref 1.15–1.40)
Chloride: 111 mmol/L (ref 98–111)
Creatinine, Ser: 2.3 mg/dL — ABNORMAL HIGH (ref 0.44–1.00)
Glucose, Bld: 154 mg/dL — ABNORMAL HIGH (ref 70–99)
HCT: 33 % — ABNORMAL LOW (ref 36.0–46.0)
Hemoglobin: 11.2 g/dL — ABNORMAL LOW (ref 12.0–15.0)
Potassium: 3.8 mmol/L (ref 3.5–5.1)
Sodium: 142 mmol/L (ref 135–145)
TCO2: 21 mmol/L — ABNORMAL LOW (ref 22–32)

## 2023-02-22 LAB — COMPREHENSIVE METABOLIC PANEL
ALT: 10 U/L (ref 0–44)
AST: 17 U/L (ref 15–41)
Albumin: 3.5 g/dL (ref 3.5–5.0)
Alkaline Phosphatase: 86 U/L (ref 38–126)
Anion gap: 8 (ref 5–15)
BUN: 24 mg/dL — ABNORMAL HIGH (ref 8–23)
CO2: 22 mmol/L (ref 22–32)
Calcium: 10 mg/dL (ref 8.9–10.3)
Chloride: 109 mmol/L (ref 98–111)
Creatinine, Ser: 2.19 mg/dL — ABNORMAL HIGH (ref 0.44–1.00)
GFR, Estimated: 21 mL/min — ABNORMAL LOW (ref >60)
Glucose, Bld: 158 mg/dL — ABNORMAL HIGH (ref 70–99)
Potassium: 3.7 mmol/L (ref 3.5–5.1)
Sodium: 139 mmol/L (ref 135–145)
Total Bilirubin: 0.4 mg/dL (ref 0.0–1.2)
Total Protein: 7.2 g/dL (ref 6.5–8.1)

## 2023-02-22 LAB — DIFFERENTIAL
Abs Immature Granulocytes: 0.02 10*3/uL (ref 0.00–0.07)
Basophils Absolute: 0 10*3/uL (ref 0.0–0.1)
Basophils Relative: 1 %
Eosinophils Absolute: 0.3 10*3/uL (ref 0.0–0.5)
Eosinophils Relative: 4 %
Immature Granulocytes: 0 %
Lymphocytes Relative: 28 %
Lymphs Abs: 2 10*3/uL (ref 0.7–4.0)
Monocytes Absolute: 0.4 10*3/uL (ref 0.1–1.0)
Monocytes Relative: 5 %
Neutro Abs: 4.5 10*3/uL (ref 1.7–7.7)
Neutrophils Relative %: 62 %

## 2023-02-22 LAB — PROTIME-INR
INR: 1 (ref 0.8–1.2)
Prothrombin Time: 13.5 s (ref 11.4–15.2)

## 2023-02-22 LAB — APTT: aPTT: 27 s (ref 24–36)

## 2023-02-22 LAB — ETHANOL: Alcohol, Ethyl (B): <10 mg/dL (ref <10)

## 2023-02-22 MED ORDER — DIAZEPAM 2 MG PO TABS
2.0000 mg | ORAL_TABLET | Freq: Once | ORAL | Status: AC | PRN
  Administered 2023-02-23: 2 mg via ORAL
  Filled 2023-02-22: qty 1

## 2023-02-22 MED ORDER — CLOPIDOGREL BISULFATE 75 MG PO TABS
75.0000 mg | ORAL_TABLET | Freq: Once | ORAL | Status: AC
  Administered 2023-02-22: 75 mg via ORAL
  Filled 2023-02-22: qty 1

## 2023-02-22 MED ORDER — ASPIRIN 81 MG PO CHEW
324.0000 mg | CHEWABLE_TABLET | Freq: Once | ORAL | Status: AC
  Administered 2023-02-22: 324 mg via ORAL
  Filled 2023-02-22: qty 4

## 2023-02-22 MED ORDER — DIAZEPAM 5 MG/ML IJ SOLN
2.5000 mg | Freq: Once | INTRAMUSCULAR | Status: AC
  Administered 2023-02-22: 2.5 mg via INTRAVENOUS
  Filled 2023-02-22: qty 2

## 2023-02-22 NOTE — ED Provider Triage Note (Signed)
Emergency Medicine Provider Triage Evaluation Note  Lynn Ray , a 89 y.o. female  was evaluated in triage.  Pt complains of speech abnormality and facial weakness LKW 11 am after phone call with son. By 1:00 pm drastic change. BIB daughter for eval. patient this evening only able to say yes or okay having significant difficulty finding words or getting them out.  Patient also noted to have facial weakness on the left.  I consulted Dr. Otelia Limes who came to triage to do an urgent evaluation.  Does not feel that the patient meets LVO or code stroke status will have the patient get an MRI he recommends Plavix and aspirin at this time..  Review of Systems  Positive: Speech difficulty Negative: Ataxia or   Physical Exam  BP (!) 147/62 (BP Location: Right Arm)   Pulse 89   Temp 98.2 F (36.8 C)   Resp 18   Ht 4' 11.5" (1.511 m)   Wt 79.4 kg   SpO2 100%   BMI 34.76 kg/m  Gen:   Awake, no distress   Resp:  Normal effort  MSK:   Moves extremities without difficulty  Other:  Left facial weakness. Word finding difficulty  Medical Decision Making  Medically screening exam initiated at 9:32 PM.  Appropriate orders placed.  Lynn Ray was informed that the remainder of the evaluation will be completed by another provider, this initial triage assessment does not replace that evaluation, and the importance of remaining in the ED until their evaluation is complete.     Arthor Captain, PA-C 02/22/23 2153

## 2023-02-22 NOTE — ED Triage Notes (Signed)
Pt arrived from home via POV c/o aphasia and facial asymmetry. LKW TBD, family currently making calls now to confirm LKW.

## 2023-02-22 NOTE — Consult Note (Signed)
NEUROLOGY CONSULT NOTE   Date of service: February 22, 2023 Patient Name: Lynn Ray MRN:  [id number] DOB:  Oct 23, 1934 Chief Complaint: Facial weakness and dysphasia Requesting Provider: No att. providers found  History of Present Illness  Leaner JUNETTE BERNAT is a 89 y.o. female with a PMHx of Arthritis, Cancer (HCC) (2004), Cataract, Chiari malformation, CKD (chronic kidney disease), stage IV (HCC), Depression, Diabetes mellitus (HCC), Hypertension, Sleep apnea, and Thyroid disease who presented to the ED on Thursday night via POV c/o aphasia and facial asymmetry. LKW was 11 AM Thursday morning.    Further history obtained by EDP has been reviewed: "Patient presented to the emergency department complaining of facial weakness and speech difficulties. She was last known well at 11 AM this morning. As of arrival this evening she was able to say yes or okay but was having difficulty finding words and getting them out. She was also noticed to have left-sided facial weakness."    ROS  As per HPI. Unable to obtain detailed ROS due to cognitive/communication deficit.   Past History   Past Medical History:  Diagnosis Date   Arthritis    Cancer (HCC) 2004   rectal cancer   Cataract    right eye   Chiari malformation    CKD (chronic kidney disease), stage IV (HCC)    Depression    Diabetes mellitus (HCC)    "pre"   Hypertension    Sleep apnea    no CPAP   Thyroid disease    hyporthyroid    Past Surgical History:  Procedure Laterality Date   CATARACT EXTRACTION Bilateral    chiari malformation  2013   surgery   COLONOSCOPY     FLEXIBLE SIGMOIDOSCOPY     POLYPECTOMY      Family History: Family History  Problem Relation Age of Onset   Alzheimer's disease Mother    Stroke Mother        mini   Heart attack Mother    Hypertension Mother    Depression Other    Anemia Other    Obesity Other    Colon cancer Neg Hx    Esophageal cancer Neg Hx    Stomach cancer Neg Hx     Social  History  reports that she has quit smoking. She has never used smokeless tobacco. She reports that she does not drink alcohol and does not use drugs.  Allergies  Allergen Reactions   Aspirin Other (See Comments)    Severe abdominal pain   Tape Rash    adhesive    Medications   Current Facility-Administered Medications:    aspirin chewable tablet 324 mg, 324 mg, Oral, Once, Harris, Abigail, PA-C   clopidogrel (PLAVIX) tablet 75 mg, 75 mg, Oral, Once, Harris, Abigail, PA-C   diazepam (VALIUM) injection 2.5 mg, 2.5 mg, Intravenous, Once, Harris, Abigail, PA-C   diazepam (VALIUM) tablet 2 mg, 2 mg, Oral, Once PRN, Arthor Captain, PA-C  Current Outpatient Medications:    amoxicillin-clavulanate (AUGMENTIN) 250-125 MG tablet, Take 1 tablet by mouth 2 (two) times daily., Disp: 14 tablet, Rfl: 0   cholecalciferol (VITAMIN D3) 25 MCG (1000 UT) tablet, Take 1,000 Units by mouth daily., Disp: , Rfl:    hydrochlorothiazide (HYDRODIURIL) 25 MG tablet, Take 0.5 tablets (12.5 mg total) by mouth daily., Disp: , Rfl:    levothyroxine (SYNTHROID) 100 MCG tablet, TAKE 1 TABLET (100 MCG TOTAL) BY MOUTH DAILY. FOLLOW-UP DUE IN JUNE WITH LABS, Disp: 90 tablet, Rfl: 0  metoprolol succinate (TOPROL-XL) 50 MG 24 hr tablet, TAKE 1 TABLET BY MOUTH EVERY DAY, Disp: 90 tablet, Rfl: 3   Tiotropium Bromide Monohydrate (SPIRIVA RESPIMAT) 2.5 MCG/ACT AERS, Inhale 2 puffs into the lungs daily., Disp: 4 g, Rfl: 0   vitamin B-12 (CYANOCOBALAMIN) 100 MCG tablet, Take 100 mcg by mouth daily., Disp: , Rfl:   Vitals   Vitals:   Mar 01, 2023 2123 Mar 01, 2023 2127 2023/03/01 2128  BP: (!) 147/62    Pulse: 89    Resp: 18    Temp: 98.2 F (36.8 C)    SpO2: 100% 100%   Weight:   79.4 kg  Height:   4' 11.5" (1.511 m)    Body mass index is 34.76 kg/m.  Physical Exam   Physical Exam  HEENT-  [place]/AT    Lungs- Respirations unlabored Extremities- No edema  Neurological Examination Mental Status: Alert, oriented to the day,  month, year, city and state. Speech is dysfluent when she is asked questions requiring full sentences; for example, she cannot describe the scene on the NIHSS card initially, murmuring "um" and "ah" over and over, but then can describe parts of the scene when examiner points to different parts of the pictures and asks focused questions about what she sees. Additionally, when asked to recite the months of the year, she again perseverated on "um" and "ah" until examiner got her started by reciting the first two months, after which she quickly and correctly recited off the remainder. Naming is intact. Comprehension intact for all commands. No dysarthria.  Cranial Nerves: II: Temporal visual fields intact with no extinction to DSS. PERRL  III,IV, VI: No ptosis. EOMI.  V: Temp sensation equal bilaterally  VII: Smile symmetric VIII: Hearing intact to voice IX,X: No hoarseness XI: Symmetric shoulder shrug XII: Midline tongue extension Motor: BUE 5/5 proximally and distally BLE 5/5 proximally and distally  No pronator drift.  Sensory: Temp and light touch intact throughout, bilaterally. No extinction to DSS.  Deep Tendon Reflexes: Normoactive Cerebellar: No ataxia with FNF bilaterally  Gait: Deferred  Labs/Imaging/Neurodiagnostic studies   CBC:  Recent Labs  Lab 01-Mar-2023 2132 03/01/23 2136  WBC 7.3  --   NEUTROABS 4.5  --   HGB 10.5* 11.2*  HCT 33.8* 33.0*  MCV 90.9  --   PLT 234  --    Basic Metabolic Panel:  Lab Results  Component Value Date   NA 142 03/01/2023   K 3.8 03/01/2023   CO2 22 2023-03-01   GLUCOSE 154 (H) 2023/03/01   BUN 25 (H) March 01, 2023   CREATININE 2.30 (H) 03/01/2023   CALCIUM 10.0 03-01-2023   GFRNONAA 21 (L) 2023/03/01   GFRAA 40 (L) 01/20/2015   Lipid Panel:  Lab Results  Component Value Date   LDLCALC 92 07/11/2017   HgbA1c:  Lab Results  Component Value Date   HGBA1C 6.0 07/21/2022   Urine Drug Screen: No results found for: "LABOPIA",  "COCAINSCRNUR", "LABBENZ", "AMPHETMU", "THCU", "LABBARB"  Alcohol Level     Component Value Date/Time   ETH <10 03-01-23 2128   INR  Lab Results  Component Value Date   INR 1.0 03-01-23   APTT  Lab Results  Component Value Date   APTT 27 2023/03/01     ASSESSMENT  89 year old female presenting from home with new onset of aphasia and facial asymmetry. LKN was 11 AM today.  - Exam shows no facial droop, motor or sensory deficit. There is confusion versus mild dysfluency in conjunction with  possible simultanagnosia. - MRI brain: Patchy acute ischemic infarct involving the parasagittal left frontal lobe. Minimal associated petechial blood products without hemorrhagic transformation or significant mass effect. Additional subcentimeter acute to early subacute ischemic nonhemorrhagic infarcts involving the subcortical right frontal lobe and superior left thalamus. Underlying age-related cerebral atrophy with chronic microvascular ischemic disease, with a few scattered remote lacunar infarcts about the hemispheric cerebral white matter and cerebellum. - EKG: Normal sinus rhythm; Left ventricular hypertrophy with repolarization abnormality (R in aVL), Possible Inferior infarct age undetermined - Not a TNK candidate due to time criteria. Not a thrombectomy candidate as symptoms/signs are not consistent with LVO.  - In the context of her CKD, she has an elevated BUN and Cr with eGFR of 21. WBC normal. Coags normal. Glucose 158. U/A negative except for rare bacteria. UDS negative.  - Overall impression: Acute ischemic strokes in separate vascular territories raise suspicion for a cardioembolic etiology.    RECOMMENDATIONS  - Although she has a history of abdominal pain due to ASA listed in her allergies, will start DAPT with ASA and Plavix for now as the prior episode of abdominal pain may have been due to another cause. If abdominal pain recurs, stop ASA and continue Plavix. - May not be a  good candidate for a statin due to her advanced age. - Modified permissive HTN protocol until 11 AM Friday morning. Treat SBP if > 180 - Gentle IVF - HgbA1c, fasting lipid panel - MRA of head - Carotid ultrasound - PT consult, OT consult, Speech consult - Telemetry monitoring - Frequent neuro checks - NPO until passes stroke swallow screen   ______________________________________________________________________    Dessa Phi, Melaine Mcphee, MD [facility]

## 2023-02-22 NOTE — ED Notes (Signed)
 CCMD called for cardiac monitoring

## 2023-02-23 ENCOUNTER — Encounter (HOSPITAL_COMMUNITY): Payer: Self-pay | Admitting: Internal Medicine

## 2023-02-23 ENCOUNTER — Inpatient Hospital Stay (HOSPITAL_COMMUNITY): Payer: Medicare PPO

## 2023-02-23 ENCOUNTER — Emergency Department (HOSPITAL_COMMUNITY): Payer: Medicare PPO

## 2023-02-23 DIAGNOSIS — I69391 Dysphagia following cerebral infarction: Secondary | ICD-10-CM | POA: Diagnosis not present

## 2023-02-23 DIAGNOSIS — Z79890 Hormone replacement therapy (postmenopausal): Secondary | ICD-10-CM | POA: Diagnosis not present

## 2023-02-23 DIAGNOSIS — I63133 Cerebral infarction due to embolism of bilateral carotid arteries: Secondary | ICD-10-CM | POA: Diagnosis not present

## 2023-02-23 DIAGNOSIS — N184 Chronic kidney disease, stage 4 (severe): Secondary | ICD-10-CM | POA: Diagnosis present

## 2023-02-23 DIAGNOSIS — I69351 Hemiplegia and hemiparesis following cerebral infarction affecting right dominant side: Secondary | ICD-10-CM | POA: Diagnosis not present

## 2023-02-23 DIAGNOSIS — E039 Hypothyroidism, unspecified: Secondary | ICD-10-CM

## 2023-02-23 DIAGNOSIS — I671 Cerebral aneurysm, nonruptured: Secondary | ICD-10-CM | POA: Diagnosis not present

## 2023-02-23 DIAGNOSIS — I639 Cerebral infarction, unspecified: Secondary | ICD-10-CM | POA: Diagnosis not present

## 2023-02-23 DIAGNOSIS — R4701 Aphasia: Secondary | ICD-10-CM | POA: Diagnosis present

## 2023-02-23 DIAGNOSIS — D472 Monoclonal gammopathy: Secondary | ICD-10-CM | POA: Diagnosis present

## 2023-02-23 DIAGNOSIS — R29810 Facial weakness: Secondary | ICD-10-CM | POA: Diagnosis present

## 2023-02-23 DIAGNOSIS — I6389 Other cerebral infarction: Secondary | ICD-10-CM | POA: Diagnosis not present

## 2023-02-23 DIAGNOSIS — I10 Essential (primary) hypertension: Secondary | ICD-10-CM

## 2023-02-23 DIAGNOSIS — I63131 Cerebral infarction due to embolism of right carotid artery: Secondary | ICD-10-CM | POA: Diagnosis not present

## 2023-02-23 DIAGNOSIS — Z91048 Other nonmedicinal substance allergy status: Secondary | ICD-10-CM | POA: Diagnosis not present

## 2023-02-23 DIAGNOSIS — I129 Hypertensive chronic kidney disease with stage 1 through stage 4 chronic kidney disease, or unspecified chronic kidney disease: Secondary | ICD-10-CM | POA: Diagnosis present

## 2023-02-23 DIAGNOSIS — Z886 Allergy status to analgesic agent status: Secondary | ICD-10-CM | POA: Diagnosis not present

## 2023-02-23 DIAGNOSIS — Z8249 Family history of ischemic heart disease and other diseases of the circulatory system: Secondary | ICD-10-CM | POA: Diagnosis not present

## 2023-02-23 DIAGNOSIS — J449 Chronic obstructive pulmonary disease, unspecified: Secondary | ICD-10-CM | POA: Diagnosis present

## 2023-02-23 DIAGNOSIS — Z7409 Other reduced mobility: Secondary | ICD-10-CM | POA: Diagnosis present

## 2023-02-23 DIAGNOSIS — Z7902 Long term (current) use of antithrombotics/antiplatelets: Secondary | ICD-10-CM | POA: Diagnosis not present

## 2023-02-23 DIAGNOSIS — R1310 Dysphagia, unspecified: Secondary | ICD-10-CM | POA: Diagnosis present

## 2023-02-23 DIAGNOSIS — D649 Anemia, unspecified: Secondary | ICD-10-CM | POA: Diagnosis not present

## 2023-02-23 DIAGNOSIS — Z87891 Personal history of nicotine dependence: Secondary | ICD-10-CM | POA: Diagnosis not present

## 2023-02-23 DIAGNOSIS — E1122 Type 2 diabetes mellitus with diabetic chronic kidney disease: Secondary | ICD-10-CM | POA: Diagnosis present

## 2023-02-23 DIAGNOSIS — Z6834 Body mass index (BMI) 34.0-34.9, adult: Secondary | ICD-10-CM | POA: Diagnosis not present

## 2023-02-23 DIAGNOSIS — Z7982 Long term (current) use of aspirin: Secondary | ICD-10-CM | POA: Diagnosis not present

## 2023-02-23 DIAGNOSIS — Z818 Family history of other mental and behavioral disorders: Secondary | ICD-10-CM | POA: Diagnosis not present

## 2023-02-23 DIAGNOSIS — E890 Postprocedural hypothyroidism: Secondary | ICD-10-CM | POA: Diagnosis present

## 2023-02-23 DIAGNOSIS — R4702 Dysphasia: Secondary | ICD-10-CM | POA: Diagnosis present

## 2023-02-23 DIAGNOSIS — E785 Hyperlipidemia, unspecified: Secondary | ICD-10-CM | POA: Diagnosis present

## 2023-02-23 DIAGNOSIS — Z23 Encounter for immunization: Secondary | ICD-10-CM | POA: Diagnosis present

## 2023-02-23 DIAGNOSIS — I6381 Other cerebral infarction due to occlusion or stenosis of small artery: Secondary | ICD-10-CM | POA: Diagnosis present

## 2023-02-23 LAB — ECHOCARDIOGRAM COMPLETE
AR max vel: 3.01 cm2
AV Area VTI: 3.08 cm2
AV Area mean vel: 2.57 cm2
AV Mean grad: 3 mm[Hg]
AV Peak grad: 4.5 mm[Hg]
Ao pk vel: 1.06 m/s
Area-P 1/2: 3.28 cm2
Calc EF: 56.5 %
Height: 59.5 in
MV VTI: 2.6 cm2
S' Lateral: 2.7 cm
Single Plane A2C EF: 61.1 %
Single Plane A4C EF: 49.8 %
Weight: 2800.72 [oz_av]

## 2023-02-23 LAB — CBC
HCT: 31.5 % — ABNORMAL LOW (ref 36.0–46.0)
Hemoglobin: 9.9 g/dL — ABNORMAL LOW (ref 12.0–15.0)
MCH: 28.2 pg (ref 26.0–34.0)
MCHC: 31.4 g/dL (ref 30.0–36.0)
MCV: 89.7 fL (ref 80.0–100.0)
Platelets: 220 10*3/uL (ref 150–400)
RBC: 3.51 MIL/uL — ABNORMAL LOW (ref 3.87–5.11)
RDW: 13.5 % (ref 11.5–15.5)
WBC: 6.8 10*3/uL (ref 4.0–10.5)
nRBC: 0 % (ref 0.0–0.2)

## 2023-02-23 LAB — URINALYSIS, ROUTINE W REFLEX MICROSCOPIC
Bilirubin Urine: NEGATIVE
Glucose, UA: NEGATIVE mg/dL
Hgb urine dipstick: NEGATIVE
Ketones, ur: NEGATIVE mg/dL
Leukocytes,Ua: NEGATIVE
Nitrite: NEGATIVE
Protein, ur: NEGATIVE mg/dL
Specific Gravity, Urine: 1.016 (ref 1.005–1.030)
pH: 5 (ref 5.0–8.0)

## 2023-02-23 LAB — RAPID URINE DRUG SCREEN, HOSP PERFORMED
Amphetamines: NOT DETECTED
Barbiturates: NOT DETECTED
Benzodiazepines: NOT DETECTED
Cocaine: NOT DETECTED
Opiates: NOT DETECTED
Tetrahydrocannabinol: NOT DETECTED

## 2023-02-23 LAB — CREATININE, SERUM
Creatinine, Ser: 2.09 mg/dL — ABNORMAL HIGH (ref 0.44–1.00)
GFR, Estimated: 23 mL/min — ABNORMAL LOW (ref >60)

## 2023-02-23 LAB — LIPID PANEL
Cholesterol: 156 mg/dL (ref 0–200)
HDL: 49 mg/dL (ref >40)
LDL Cholesterol: 84 mg/dL (ref 0–99)
Total CHOL/HDL Ratio: 3.2 {ratio}
Triglycerides: 115 mg/dL (ref <150)
VLDL: 23 mg/dL (ref 0–40)

## 2023-02-23 LAB — HEMOGLOBIN A1C
Hgb A1c MFr Bld: 5.8 % — ABNORMAL HIGH (ref 4.8–5.6)
Mean Plasma Glucose: 119.76 mg/dL

## 2023-02-23 MED ORDER — ACETAMINOPHEN 325 MG PO TABS: 650.0000 mg | ORAL_TABLET | ORAL | Status: DC | PRN

## 2023-02-23 MED ORDER — ASPIRIN 325 MG PO TBEC
325.0000 mg | DELAYED_RELEASE_TABLET | Freq: Every day | ORAL | Status: DC
  Administered 2023-02-23 – 2023-02-26 (×4): 325 mg via ORAL
  Filled 2023-02-23 (×5): qty 1

## 2023-02-23 MED ORDER — METOPROLOL SUCCINATE ER 25 MG PO TB24
50.0000 mg | ORAL_TABLET | Freq: Every day | ORAL | Status: DC
  Administered 2023-02-23: 50 mg via ORAL
  Filled 2023-02-23: qty 2

## 2023-02-23 MED ORDER — ENOXAPARIN SODIUM 30 MG/0.3ML IJ SOSY
30.0000 mg | PREFILLED_SYRINGE | Freq: Every day | INTRAMUSCULAR | Status: DC
  Administered 2023-02-23 – 2023-02-28 (×6): 30 mg via SUBCUTANEOUS
  Filled 2023-02-23 (×6): qty 0.3

## 2023-02-23 MED ORDER — CLOPIDOGREL BISULFATE 75 MG PO TABS
75.0000 mg | ORAL_TABLET | Freq: Every day | ORAL | Status: DC
  Administered 2023-02-24 – 2023-02-28 (×5): 75 mg via ORAL
  Filled 2023-02-23 (×6): qty 1

## 2023-02-23 MED ORDER — STROKE: EARLY STAGES OF RECOVERY BOOK
Freq: Once | Status: AC
  Filled 2023-02-23: qty 1

## 2023-02-23 MED ORDER — ACETAMINOPHEN 160 MG/5ML PO SOLN: 650.0000 mg | ORAL | Status: DC | PRN

## 2023-02-23 MED ORDER — SODIUM CHLORIDE 0.9 % IV SOLN: INTRAVENOUS | Status: AC

## 2023-02-23 MED ORDER — ATORVASTATIN CALCIUM 40 MG PO TABS
40.0000 mg | ORAL_TABLET | Freq: Every day | ORAL | Status: DC
  Administered 2023-02-23 – 2023-02-28 (×6): 40 mg via ORAL
  Filled 2023-02-23 (×6): qty 1

## 2023-02-23 MED ORDER — VITAMIN B-12 1000 MCG PO TABS
1000.0000 ug | ORAL_TABLET | Freq: Every day | ORAL | Status: DC
  Administered 2023-02-23 – 2023-02-28 (×6): 1000 ug via ORAL
  Filled 2023-02-23 (×8): qty 1

## 2023-02-23 MED ORDER — ASPIRIN 300 MG RE SUPP
300.0000 mg | Freq: Every day | RECTAL | Status: DC
  Filled 2023-02-23: qty 1

## 2023-02-23 MED ORDER — LEVOTHYROXINE SODIUM 100 MCG PO TABS
100.0000 ug | ORAL_TABLET | Freq: Every day | ORAL | Status: DC
  Administered 2023-02-23 – 2023-02-28 (×6): 100 ug via ORAL
  Filled 2023-02-23 (×6): qty 1

## 2023-02-23 MED ORDER — ACETAMINOPHEN 650 MG RE SUPP: 650.0000 mg | RECTAL | Status: DC | PRN

## 2023-02-23 NOTE — Evaluation (Signed)
Physical Therapy Evaluation Patient Details Name: BRAIDEN PRESUTTI MRN: [id number] DOB: [birth year] Today's Date: 02/23/2023  History of Present Illness  89 yo female demonstrates expressive deficits and L facial droop. 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   Clinical Impression  Nashiya WAVER DIBIASIO is 89 y.o. female admitted with above HPI and diagnosis. Patient is currently limited by functional impairments below (see PT problem list). Patient lives alone and is independent at baseline. Currently pt limited by impaired cognition, general weakness, and impaired balance. Pt required min assist for transfers and gait this date and cognition declining as pt fatigued throughout session. Patient will benefit from continued skilled PT interventions to address impairments and progress independence with mobility. Patient will benefit from intensive inpatient follow up therapy, >3 hours/day  Acute PT will follow and progress as able.         If plan is discharge home, recommend the following: A little help with walking and/or transfers;A little help with bathing/dressing/bathroom;Assistance with cooking/housework;Direct supervision/assist for medications management;Assist for transportation;Help with stairs or ramp for entrance;Supervision due to cognitive status   Can travel by private vehicle        Equipment Recommendations  (defer to next venue)  Recommendations for Other Services       Functional Status Assessment Patient has had a recent decline in their functional status and demonstrates the ability to make significant improvements in function in a reasonable and predictable amount of time.     Precautions / Restrictions Precautions Precautions: Fall Precaution Comments: BP<220/110 Restrictions Weight Bearing Restrictions Per Provider Order: No      Mobility  Bed Mobility Overal bed mobility: Needs Assistance Bed  Mobility: Supine to Sit, Sit to Supine     Supine to sit: Contact guard, HOB elevated Sit to supine: Contact guard assist, HOB elevated   General bed mobility comments: CGA for safety from stretcher    Transfers Overall transfer level: Needs assistance Equipment used: None Transfers: Sit to/from Stand Sit to Stand: Contact guard assist, Min assist           General transfer comment: close CGA for safety with rise, HHA provided with min assist for steadying balance on standing.    Ambulation/Gait Ambulation/Gait assistance: Min assist Gait Distance (Feet): 20 Feet Assistive device: 1 person hand held assist Gait Pattern/deviations: Step-through pattern, Decreased stride length, Decreased dorsiflexion - right, Decreased dorsiflexion - left, Shuffle, Wide base of support Gait velocity: decr     General Gait Details: HHA required to steady balance, cues for direction and pt getting distracted by external stimuli in hallway.  Stairs            Wheelchair Mobility     Tilt Bed    Modified Rankin (Stroke Patients Only)       Balance Overall balance assessment: Needs assistance Sitting-balance support: Feet supported Sitting balance-Leahy Scale: Good Sitting balance - Comments: attempted far anterior lean to don socks, able to don with min assist and extra effort   Standing balance support: Single extremity supported, During functional activity Standing balance-Leahy Scale: Fair Standing balance comment: reliant on HHA                             Pertinent Vitals/Pain      Home Living Family/patient expects to be discharged to:: Private residence Living Arrangements: Alone   Type of Home:  House Home Access: Stairs to enter Entrance Stairs-Rails: Right Entrance Stairs-Number of Steps: 3 Alternate Level Stairs-Number of Steps: 6 Home Layout: Multi-level Home Equipment: Shower seat;Cane - single point      Prior Function Prior Level of  Function : Independent/Modified Independent;Driving               ADLs Comments: does her cooking, dtr cleans,    Muscle bulk: normal, tone normal, no tremors  MMT: Mvmt Root Nerve  Muscle Right Left Comments  SA C5/6 Ax Deltoid 4- 4-    EF C5/6 Mc Biceps 4 4    EE C6/7/8 Rad Triceps 4 4    WF C6/7 Med FCR 4 4    WE C7/8 PIN ECU  4+ 4+     F Ab C8/T1 U ADM/FDI      HF L1/2/3 Fem Illopsoas 3 3    KE L2/3/4 Fem Quad 4+ 4+     KF L5/S1/2 Sciatic Ham 4+ 4+   DF L4/5 D Peron Tib Ant 4+ 4+    PF S1/2 Tibial Grc/Sol 4- 4-     Sensation:  Light touch Grossly intact   Pin prick     Temperature     Vibration    Proprioception      Coordination/Complex Motor:  - Finger to Nose: intact bil, slower on Rt - Heel to shin: attempted coordinated movement, limited by weaknes - Rapid alternating movement: intact with following tactile cues for full movement - Gait: mildly unsteady, HHA for support. No ataxia. Balance decreased with pt distracted by external stimuli  Extremity/Trunk Assessment   Upper Extremity Assessment Upper Extremity Assessment: Defer to OT evaluation    Lower Extremity Assessment Lower Extremity Assessment: Overall WFL for tasks assessed    Cervical / Trunk Assessment Cervical / Trunk Assessment: Normal  Communication   Communication Communication: No apparent difficulties  Cognition Arousal: Alert Behavior During Therapy: WFL for tasks assessed/performed Overall Cognitive Status: Impaired/Different from baseline Area of Impairment: Attention, Memory, Following commands, Safety/judgement, Awareness, Problem solving                   Current Attention Level: Sustained Memory: Decreased short-term memory, Decreased recall of precautions Following Commands: Follows one step commands consistently, Follows one step commands with increased time, Follows multi-step commands inconsistently (HOH, may be impacting this) Safety/Judgement: Decreased  awareness of safety, Decreased awareness of deficits Awareness: Intellectual Problem Solving: Slow processing, Decreased initiation, Difficulty sequencing, Requires verbal cues General Comments: pt having difficulty finding word and requires extra time to process and answer questions with simple response. (~5-10 secon delay). pt given name/address "john brown [address] [place]": first repeat with 1 error (missed house #), second repeat no errors. repeat 5 mins later 1 error (forgot to state street name).        General Comments      Exercises     Assessment/Plan    PT Assessment Patient needs continued PT services  PT Problem List Decreased strength;Decreased activity tolerance;Decreased balance;Decreased mobility;Decreased cognition;Decreased knowledge of use of DME;Decreased safety awareness;Decreased knowledge of precautions       PT Treatment Interventions DME instruction;Gait training;Stair training;Functional mobility training;Therapeutic activities;Therapeutic exercise;Balance training;Neuromuscular re-education;Cognitive remediation;Patient/family education;Wheelchair mobility training;Manual techniques    PT Goals (Current goals can be found in the Care Plan section)  Acute Rehab PT Goals Patient Stated Goal: regain independence PT Goal Formulation: With patient Time For Goal Achievement: 03/09/23 Potential to Achieve Goals: Good    Frequency Min  1X/week     Co-evaluation               AM-PAC PT "6 Clicks" Mobility  Outcome Measure Help needed turning from your back to your side while in a flat bed without using bedrails?: A Little Help needed moving from lying on your back to sitting on the side of a flat bed without using bedrails?: A Little Help needed moving to and from a bed to a chair (including a wheelchair)?: A Little Help needed standing up from a chair using your arms (e.g., wheelchair or bedside chair)?: A Little Help needed to walk in  hospital room?: A Little Help needed climbing 3-5 steps with a railing? : Total 6 Click Score: 16    End of Session Equipment Utilized During Treatment: Gait belt Activity Tolerance: Patient tolerated treatment well Patient left: in bed;with call bell/phone within reach;with family/visitor present;with bed alarm set Nurse Communication: Mobility status PT Visit Diagnosis: Muscle weakness (generalized) (M62.81);Difficulty in walking, not elsewhere classified (R26.2);Other abnormalities of gait and mobility (R26.89);Other symptoms and signs involving the nervous system (R29.898);Unsteadiness on feet (R26.81)    Time: 8413-2440 PT Time Calculation (min) (ACUTE ONLY): 34 min   Charges:   PT Evaluation $PT Eval Moderate Complexity: 1 Mod PT Treatments $Gait Training: 8-22 mins PT General Charges $$ ACUTE PT VISIT: 1 Visit         Wynn Maudlin, DPT Acute Rehabilitation Services Office [phone]  02/23/23 4:58 PM

## 2023-02-23 NOTE — ED Notes (Signed)
Patient transported to Ultrasound

## 2023-02-23 NOTE — Progress Notes (Signed)
Responded to request for prayer. Visited with patient and daughter Gunnar Fusi in ED awaiting for a room to be admitted. Provided spiritual care,

## 2023-02-23 NOTE — Progress Notes (Addendum)
STROKE TEAM PROGRESS NOTE   BRIEF HPI [name] is a 89 y.o. female with history of hypertension, postoperative hypothyroidism, chronic kidney disease stage IV, chronic anemia, MGUS, prediabetes, rectal cancer was brought to the ER after patient was noticed to have difficulty expressing words.   SIGNIFICANT HOSPITAL EVENTS 1/24 MRI brain with patchy acute left frontal lobe ischemic infarcts and acute/subacute infarcts in right frontal lobe and left thalamus  INTERIM HISTORY/SUBJECTIVE MRI brain with patchy acute left frontal lobe ischemic infarcts and acute/subacute infarcts in right frontal lobe and left thalamus  Daughter is at the bedside.  Daughters states last known well was 10:30 AM yesterday.  Around 1 PM she was on the phone with her son and things seem to be a little off daughter arrived at the house that evening and she was still often having trouble speaking.  Patient states difficulty speaking has improved but there are still some issues with getting her words out  Neuro exam she is oriented x 3 she has a slight right facial droop, decreased right grip and has decreased orbiting of left over right.  No weakness noted in extremities and no sensation deficits, no ataxia  OBJECTIVE  CBC    Component Value Date/Time   WBC 6.8 02/23/2023 0458   RBC 3.51 (L) 02/23/2023 0458   HGB 9.9 (L) 02/23/2023 0458   HGB 10.3 (L) 12/04/2022 1421   HGB 12.0 01/20/2015 1112   HCT 31.5 (L) 02/23/2023 0458   HCT 36.4 01/20/2015 1112   PLT 220 02/23/2023 0458   PLT 248 12/04/2022 1421   PLT 320 01/20/2015 1112   MCV 89.7 02/23/2023 0458   MCV 85 01/20/2015 1112   MCH 28.2 02/23/2023 0458   MCHC 31.4 02/23/2023 0458   RDW 13.5 02/23/2023 0458   RDW 14.0 01/20/2015 1112   LYMPHSABS 2.0 02/22/2023 2132   LYMPHSABS 2.0 01/20/2015 1112   MONOABS 0.4 02/22/2023 2132   EOSABS 0.3 02/22/2023 2132   EOSABS 0.3 01/20/2015 1112   BASOSABS 0.0 02/22/2023 2132   BASOSABS 0.0 01/20/2015 1112     BMET    Component Value Date/Time   NA 142 02/22/2023 2136   NA 138 02/14/2022 0000   K 3.8 02/22/2023 2136   CL 111 02/22/2023 2136   CO2 22 02/22/2023 2132   GLUCOSE 154 (H) 02/22/2023 2136   BUN 25 (H) 02/22/2023 2136   BUN 21 02/14/2022 0000   CREATININE 2.09 (H) 02/23/2023 0458   CREATININE 2.25 (H) 12/04/2022 1421   CALCIUM 10.0 02/22/2023 2132   EGFR 24 02/14/2022 0000   GFRNONAA 23 (L) 02/23/2023 0458   GFRNONAA 21 (L) 12/04/2022 1421    IMAGING past 24 hours VAS US CAROTID (at [facility] and [facility] only) Result Date: 02/23/2023 Carotid Arterial Duplex Study Patient Name:  Lynn Ray  Date of Exam:   02/23/2023 Medical Rec #: [id number]     Accession #:    [id number] Date of Birth: 12-20-1934     Patient Gender: F Patient Age:   76 years Exam Location:  [facility] Procedure:      VAS US CAROTID Referring Phys: Midge Minium --------------------------------------------------------------------------------  Indications:   CVA and Speech disturbance. Risk Factors:  Hypertension, hyperlipidemia, Diabetes. Other Factors: Hypothyroidism, CKD stage IV. Performing Technologist: Marilynne Halsted RDMS, RVT  Examination Guidelines: A complete evaluation includes B-mode imaging, spectral Doppler, color Doppler, and power Doppler as needed of all accessible portions of each vessel. Bilateral testing is considered an integral  part of a complete examination. Limited examinations for reoccurring indications may be performed as noted.  Right Carotid Findings: +----------+--------+--------+--------+------------------+------------------+           PSV cm/sEDV cm/sStenosisPlaque DescriptionComments           +----------+--------+--------+--------+------------------+------------------+ CCA Prox  67      12                                                   +----------+--------+--------+--------+------------------+------------------+ CCA Distal66      15                                 intimal thickening +----------+--------+--------+--------+------------------+------------------+ ICA Prox  79      17                                intimal thickening +----------+--------+--------+--------+------------------+------------------+ ICA Distal77      21                                                   +----------+--------+--------+--------+------------------+------------------+ ECA       76      10                                                   +----------+--------+--------+--------+------------------+------------------+ +----------+--------+-------+----------------+-------------------+           PSV cm/sEDV cmsDescribe        Arm Pressure (mmHG) +----------+--------+-------+----------------+-------------------+ [id number]             Multiphasic, WNL                    +----------+--------+-------+----------------+-------------------+ +---------+--------+--+--------+--+---------+ VertebralPSV cm/s34EDV cm/s10Antegrade +---------+--------+--+--------+--+---------+  Left Carotid Findings: +----------+--------+--------+--------+------------------+------------------+           PSV cm/sEDV cm/sStenosisPlaque DescriptionComments           +----------+--------+--------+--------+------------------+------------------+ CCA Prox  72      17                                                   +----------+--------+--------+--------+------------------+------------------+ CCA Distal62      19                                intimal thickening +----------+--------+--------+--------+------------------+------------------+ ICA Prox  54      14                                                   +----------+--------+--------+--------+------------------+------------------+ ICA Mid   70      25                                                   +----------+--------+--------+--------+------------------+------------------+  ICA Distal111     27                                 tortuous           +----------+--------+--------+--------+------------------+------------------+ ECA       60      9                                                    +----------+--------+--------+--------+------------------+------------------+ +----------+--------+--------+----------------+-------------------+           PSV cm/sEDV cm/sDescribe        Arm Pressure (mmHG) +----------+--------+--------+----------------+-------------------+ [id number]             Multiphasic, WNL                    +----------+--------+--------+----------------+-------------------+ +---------+--------+--+--------+--+ VertebralPSV cm/s68EDV cm/s10 +---------+--------+--+--------+--+   Summary: Right Carotid: The extracranial vessels were near-normal with only minimal wall                thickening or plaque. Left Carotid: The extracranial vessels were near-normal with only minimal wall               thickening or plaque. Vertebrals:  Bilateral vertebral arteries demonstrate antegrade flow. Subclavians: Normal flow hemodynamics were seen in bilateral subclavian              arteries. *See table(s) above for measurements and observations.     Preliminary    MR ANGIO HEAD WO CONTRAST Result Date: 02/23/2023 CLINICAL DATA:  Provided history: Stroke, follow-up. EXAM: MRA HEAD WITHOUT CONTRAST TECHNIQUE: Angiographic images of the Circle of Willis were acquired using MRA technique without intravenous contrast. COMPARISON:  Brain MRI 02/23/2023. FINDINGS: Anterior circulation: The intracranial internal carotid arteries are patent. The M1 middle cerebral arteries are patent. No M2 proximal branch occlusion or high-grade proximal stenosis. The anterior cerebral arteries are patent. 2 mm posteriorly projecting vascular protrusion arising from the right ICA at the petrous-cavernous junction, which may reflect an aneurysm or infundibulum (series 350, image 4). 2 mm laterally projecting  vascular protrusion arising from the cavernous right internal carotid artery, which may reflect an aneurysm or infundibulum (series 3, image 57) series 350, image 4). 3 mm medially projecting vascular protrusion arising from the cavernous left internal carotid artery consistent with an aneurysm (series 3, image 64) series 351, image 9). Posterior circulation: The intracranial vertebral arteries are patent. The left vertebral artery is dominant. The basilar artery is patent. The posterior cerebral arteries are patent. Posterior communicating arteries are present bilaterally. Anatomic variants: As described. IMPRESSION: 1. No proximal intracranial large vessel occlusion or high-grade proximal arterial stenosis identified. 2. 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 2 mm vascular protrusion arising from the right internal carotid artery at the petrous-cavernous junction, which may reflect an aneurysm or infundibulum. 2 mm vascular protrusion arising from the cavernous right internal carotid artery more distally, which may reflect an aneurysm or infundibulum. Neurointerventional consultation recommended. Electronically Signed   By: Jackey Loge D.O.   On: 02/23/2023 09:50   MR BRAIN WO CONTRAST Result Date: 02/23/2023 CLINICAL DATA:  Initial evaluation for acute neuro deficit, stroke suspected. EXAM: MRI HEAD WITHOUT CONTRAST TECHNIQUE: Multiplanar, multiecho pulse sequences of the brain and surrounding structures were obtained without  intravenous contrast. COMPARISON:  CT from 05/19/2022 FINDINGS: Brain: Examination mildly degraded by motion artifact. Mild age-related cerebral atrophy. Patchy T2/FLAIR hyperintensity involving the periventricular deep white matter both cerebral hemispheres, consistent with chronic small vessel ischemic disease, mild for age. Few scattered superimposed remote lacunar infarcts present about the hemispheric cerebral white matter. Remote cortical subcortical infarct noted at  the posterior right frontal region. Few scattered small remote bilateral cerebellar infarcts noted. Minimal chronic hemosiderin staining noted about a few of these infarcts. Patchy restricted diffusion involving the parasagittal left frontal lobe, consistent with acute ischemic infarct (series 5, images 79, 81). Additional subcentimeter acute to subacute ischemic infarct noted at the contralateral subcortical right frontal lobe (series 5, image 80). Subcentimeter acute to early subacute infarct at the superior left thalamus (series 5, image 77). Mild associated petechial blood products at the left frontal infarct (series 12, image 42). No other associated hemorrhage. No other acute or chronic intracranial blood products. No mass lesion, midline shift or mass effect. No hydrocephalus or extra-axial fluid collection. Pituitary gland suprasellar region within normal limits. Vascular: Major intracranial vascular flow voids are maintained. Skull and upper cervical spine: Craniocervical junction within normal limits. Bone marrow signal intensity grossly normal. No scalp soft tissue abnormality. Sinuses/Orbits: Prior bilateral ocular lens replacement. 1 cm mildly complex cystic focus noted at the lateral margin of the right orbit, nonspecific, but likely benign and of doubtful significance. Paranasal sinuses are largely clear. Small left mastoid effusion, of doubtful significance. Negative nasopharynx. Other: None. IMPRESSION: 1. Patchy acute ischemic infarct involving the parasagittal left frontal lobe. Minimal associated petechial blood products without hemorrhagic transformation or significant mass effect. 2. Additional subcentimeter acute to early subacute ischemic nonhemorrhagic infarcts involving the subcortical right frontal lobe and superior left thalamus. 3. Underlying age-related cerebral atrophy with chronic microvascular ischemic disease, with a few scattered remote lacunar infarcts about the hemispheric  cerebral white matter and cerebellum. Electronically Signed   By: Rise Mu M.D.   On: 02/23/2023 02:24    Vitals:   02/23/23 0719 02/23/23 0950 02/23/23 1100 02/23/23 1300  BP:  (!) 157/51 (!) 147/53 107/89  Pulse:  (!) 45 65 72  Resp:  16 (!) 22 15  Temp: 98.2 F (36.8 C)     TempSrc: Axillary     SpO2:   100% 100%  Weight:      Height:         PHYSICAL EXAM General:  Alert, well-nourished, well-developed patient in no acute distress Psych:  Mood and affect appropriate for situation CV: Regular rate and rhythm on monitor Respiratory:  Regular, unlabored respirations on room air GI: Abdomen soft and nontender   NEURO:  Mental Status: AA&Ox3, patient is able to give clear and coherent history Speech/Language: speech is without dysarthria or aphasia.  Naming, repetition, fluency, and comprehension intact.  Cranial Nerves:  II: PERRL. Visual fields full.  III, IV, VI: EOMI. Eyelids elevate symmetrically.  V: Sensation is intact to light touch and symmetrical to face.  VII: Face is asymmetrical resting and smiling VIII: hearing intact to voice. IX, X: Palate elevates symmetrically. Phonation is normal.  GM:WNUUVOZD shrug 5/5. XII: tongue is midline without fasciculations. Motor: 5/5 strength to all muscle groups tested.  Tone: is normal and bulk is normal Sensation- Intact to light touch bilaterally. Extinction absent to light touch to DSS.   Coordination: FTN intact bilaterally, HKS: no ataxia in BLE.No drift.  Gait- deferred      ASSESSMENT/PLAN  Acute Ischemic Infarct:  bilateral  left frontal lobe ischemic infarcts and acute/subacute infarcts in right frontal lobe and left thalamus  Etiology: Likely cardioembolic MRI  patchy acute left frontal lobe ischemic infarcts and acute/subacute infarcts in right frontal lobe and left thalamus MRA no LVO. 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 2 mm vascular protrusion arising from the right internal  carotid artery at the petrous-cavernous junction, which may reflect an aneurysm or infundibulum. 2 mm vascular protrusion arising from the cavernous right internal carotid artery more distally, which may reflect an aneurysm or infundibulum.   Carotid Doppler bilateral no significant carotid stenosis. 2D Echo pending Recommend 30-day heart monitor after discharge LDL 84 HgbA1c 5.8 VTE prophylaxis -Lovenox No antithrombotic prior to admission, now on aspirin 81 mg daily and clopidogrel 75 mg daily for 3 weeks and then aspirin alone. Therapy recommendations:  Pending Disposition: Pending   Hypertension Home meds: HCTZ 12.5 mg, metoprolol 50 mg Stable Blood Pressure Goal: BP less than 220/110   Hyperlipidemia Home meds: None ,  LDL 84, goal < 70 Add atorvastatin 40 mg Continue statin at discharge  Dysphagia Patient has post-stroke dysphagia, SLP consulted    Diet   Diet Heart Room service appropriate? Yes; Fluid consistency: Thin   Advance diet as tolerated  Other Stroke Risk Factors Obesity, Body mass index is 34.76 kg/m., BMI >/= 30 associated with increased stroke risk, recommend weight loss, diet and exercise as appropriate  Obstructive sleep apnea,    Other Active Problems Depression Hypothyroidism CKD   Hospital day # 0  Gevena Mart DNP, ACNPC-AG  [facility]  I have personally obtained history,examined this patient, reviewed notes, independently viewed imaging studies, participated in medical decision making and plan of care.ROS completed by me personally and pertinent positives fully documented  I have made any additions or clarifications directly to the above note. Agree with note above.  Patient presented with speech difficulties and MRI shows left frontal ACA branch and left thalamic infarct with MRI showing no significant large vessel stenosis but small incidental 3 mm aneurysm.  Recommend aspirin Plavix for 3 weeks followed by aspirin alone and  aggressive risk factor modification.  Patient will need 30-day heart monitor at discharge to look for paroxysmal A-fib.  Mobilize out of bed.  Therapy consults.  Check 2D echo results.  Aggressive risk factor modification. Greater than 50% time during this 50-minute visit was spent in counseling and coordination of care and discussion patient care team and answering questions.  Discussed with Dr. Jaclynn Major.  Stroke team will sign off.  Kindly call for questions Delia Heady, MD Medical Director [facility] Pager: [phone] 02/23/2023 4:33 PM   To contact Stroke Continuity provider, please refer to [email or web address]. After hours, contact General Neurology

## 2023-02-23 NOTE — ED Notes (Signed)
Patient moved to hospital bed for comfort

## 2023-02-23 NOTE — ED Provider Notes (Signed)
[place] EMERGENCY DEPARTMENT AT [facility] Provider Note   CSN: [id number] Arrival date & time: 02/22/23  2109     History  Chief Complaint  Patient presents with   Aphasia   Facial Droop    Lynn Ray is a 89 y.o. female.  Patient presented to the emergency department complaining of facial weakness and speech difficulties.  She was last known well at 11 AM this morning.  As of arrival this evening she was able to say yes or okay but was having difficulty finding words and getting them out.  She was also noticed to have left-sided facial weakness.  Patient was evaluated in triage by neurology, Dr.Lindzen, who did not feel at that time that the patient met LVO or code stroke status and had MR brain ordered, started Plavix and aspirin.  Past medical history significant for obesity, hypertension, COPD, stage IV CKD, prediabetes  HPI     Home Medications Prior to Admission medications   Medication Sig Start Date End Date Taking? Authorizing Provider  amoxicillin-clavulanate (AUGMENTIN) 250-125 MG tablet Take 1 tablet by mouth 2 (two) times daily. 10/17/22   Raspet, Noberto Retort, PA-C  cholecalciferol (VITAMIN D3) 25 MCG (1000 UT) tablet Take 1,000 Units by mouth daily.    [provider]  hydrochlorothiazide (HYDRODIURIL) 25 MG tablet Take 0.5 tablets (12.5 mg total) by mouth daily. 12/01/20   Johney Maine, MD  levothyroxine (SYNTHROID) 100 MCG tablet TAKE 1 TABLET (100 MCG TOTAL) BY MOUTH DAILY. FOLLOW-UP DUE IN [place] WITH LABS 11/28/22   [name], MD  metoprolol succinate (TOPROL-XL) 50 MG 24 hr tablet TAKE 1 TABLET BY MOUTH EVERY DAY 01/05/20   [name], MD  Tiotropium Bromide Monohydrate (SPIRIVA RESPIMAT) 2.5 MCG/ACT AERS Inhale 2 puffs into the lungs daily. 10/17/22   Raspet, Noberto Retort, PA-C  vitamin B-12 (CYANOCOBALAMIN) 100 MCG tablet Take 100 mcg by mouth daily.    [provider]      Allergies    Aspirin and Tape     Review of Systems   Review of Systems  Physical Exam Updated Vital Signs BP 136/63   Pulse 80   Temp 98.2 F (36.8 C) (Oral)   Resp 14   Ht 4' 11.5" (1.511 m)   Wt 79.4 kg   SpO2 100%   BMI 34.76 kg/m  Physical Exam Vitals and nursing note reviewed.  Constitutional:      General: She is not in acute distress.    Appearance: She is well-developed.  HENT:     Head: Normocephalic and atraumatic.  Eyes:     Conjunctiva/sclera: Conjunctivae normal.  Cardiovascular:     Rate and Rhythm: Normal rate and regular rhythm.  Pulmonary:     Effort: Pulmonary effort is normal. No respiratory distress.     Breath sounds: Normal breath sounds.  Abdominal:     Palpations: Abdomen is soft.     Tenderness: There is no abdominal tenderness.  Musculoskeletal:        General: No swelling.     Cervical back: Neck supple.  Skin:    General: Skin is warm and dry.     Capillary Refill: Capillary refill takes less than 2 seconds.  Neurological:     Mental Status: She is alert.     Comments: Patient is alert and oriented.  Disfluent speech.  Cranial nerves II through XII grossly intact.  Bilateral upper and lower extremities with 5 out of 5 strength.  No pronator drift.  Sensation intact bilaterally in upper and lower extremity.  Patient with left-sided facial droop.  Psychiatric:        Mood and Affect: Mood normal.     ED Results / Procedures / Treatments   Labs (all labs ordered are listed, but only abnormal results are displayed) Labs Reviewed  CBC - Abnormal; Notable for the following components:      Result Value   RBC 3.72 (*)    Hemoglobin 10.5 (*)    HCT 33.8 (*)    All other components within normal limits  COMPREHENSIVE METABOLIC PANEL - Abnormal; Notable for the following components:   Glucose, Bld 158 (*)    BUN 24 (*)    Creatinine, Ser 2.19 (*)    GFR, Estimated 21 (*)    All other components within normal limits  I-STAT CHEM 8, ED - Abnormal; Notable for the  following components:   BUN 25 (*)    Creatinine, Ser 2.30 (*)    Glucose, Bld 154 (*)    TCO2 21 (*)    Hemoglobin 11.2 (*)    HCT 33.0 (*)    All other components within normal limits  ETHANOL  PROTIME-INR  APTT  DIFFERENTIAL  RAPID URINE DRUG SCREEN, HOSP PERFORMED  URINALYSIS, ROUTINE W REFLEX MICROSCOPIC    EKG None  Radiology MR BRAIN WO CONTRAST Result Date: 02/23/2023 CLINICAL DATA:  Initial evaluation for acute neuro deficit, stroke suspected. EXAM: MRI HEAD WITHOUT CONTRAST TECHNIQUE: Multiplanar, multiecho pulse sequences of the brain and surrounding structures were obtained without intravenous contrast. COMPARISON:  CT from 05/19/2022 FINDINGS: Brain: Examination mildly degraded by motion artifact. Mild age-related cerebral atrophy. Patchy T2/FLAIR hyperintensity involving the periventricular deep white matter both cerebral hemispheres, consistent with chronic small vessel ischemic disease, mild for age. Few scattered superimposed remote lacunar infarcts present about the hemispheric cerebral white matter. Remote cortical subcortical infarct noted at the posterior right frontal region. Few scattered small remote bilateral cerebellar infarcts noted. Minimal chronic hemosiderin staining noted about a few of these infarcts. Patchy restricted diffusion involving the parasagittal left frontal lobe, consistent with acute ischemic infarct (series 5, images 79, 81). Additional subcentimeter acute to subacute ischemic infarct noted at the contralateral subcortical right frontal lobe (series 5, image 80). Subcentimeter acute to early subacute infarct at the superior left thalamus (series 5, image 77). Mild associated petechial blood products at the left frontal infarct (series 12, image 42). No other associated hemorrhage. No other acute or chronic intracranial blood products. No mass lesion, midline shift or mass effect. No hydrocephalus or extra-axial fluid collection. Pituitary gland  suprasellar region within normal limits. Vascular: Major intracranial vascular flow voids are maintained. Skull and upper cervical spine: Craniocervical junction within normal limits. Bone marrow signal intensity grossly normal. No scalp soft tissue abnormality. Sinuses/Orbits: Prior bilateral ocular lens replacement. 1 cm mildly complex cystic focus noted at the lateral margin of the right orbit, nonspecific, but likely benign and of doubtful significance. Paranasal sinuses are largely clear. Small left mastoid effusion, of doubtful significance. Negative nasopharynx. Other: None. IMPRESSION: 1. Patchy acute ischemic infarct involving the parasagittal left frontal lobe. Minimal associated petechial blood products without hemorrhagic transformation or significant mass effect. 2. Additional subcentimeter acute to early subacute ischemic nonhemorrhagic infarcts involving the subcortical right frontal lobe and superior left thalamus. 3. Underlying age-related cerebral atrophy with chronic microvascular ischemic disease, with a few scattered remote lacunar infarcts about the hemispheric cerebral white matter and cerebellum. Electronically Signed  By: Rise Mu M.D.   On: 02/23/2023 02:24    Procedures Procedures    Medications Ordered in ED Medications  clopidogrel (PLAVIX) tablet 75 mg (75 mg Oral Given 02/22/23 2222)  aspirin chewable tablet 324 mg (324 mg Oral Given 02/22/23 2222)  diazepam (VALIUM) tablet 2 mg (2 mg Oral Given 02/23/23 0006)  diazepam (VALIUM) injection 2.5 mg (2.5 mg Intravenous Given 02/22/23 2222)    ED Course/ Medical Decision Making/ A&P                                 Medical Decision Making Risk Decision regarding hospitalization.   This patient presents to the ED for concern of speech difficulties and left-sided facial droop, this involves an extensive number of treatment options, and is a complaint that carries with it a high risk of complications and  morbidity.  The differential diagnosis includes CVA, TIA, metabolic abnormality, others   Co morbidities that complicate the patient evaluation  Arthritis, hypertension, diabetes, CKD   Additional history obtained:  Additional history obtained from family External records from outside source obtained and reviewed including internal medicine notes   Lab Tests:  I Ordered, and personally interpreted labs.  The pertinent results include: Creatinine 2.19 at baseline, hemoglobin 10.5 at baseline   Imaging Studies ordered:  I ordered imaging studies including MR brain without contrast I independently visualized and interpreted imaging which showed  1. Patchy acute ischemic infarct involving the parasagittal left  frontal lobe. Minimal associated petechial blood products without  hemorrhagic transformation or significant mass effect.  2. Additional subcentimeter acute to early subacute ischemic  nonhemorrhagic infarcts involving the subcortical right frontal lobe  and superior left thalamus.  3. Underlying age-related cerebral atrophy with chronic  microvascular ischemic disease, with a few scattered remote lacunar  infarcts about the hemispheric cerebral white matter and cerebellum.   I agree with the radiologist interpretation   Cardiac Monitoring: / EKG:  The patient was maintained on a cardiac monitor.  I personally viewed and interpreted the cardiac monitored which showed an underlying rhythm of: Normal sinus rhythm   Consultations Obtained:  I requested consultation with the neurologist, Dr.Lindzen  and discussed lab and imaging findings as well as pertinent plan - they recommend: MR Brain, starting ASA and plavix. Requested medicine admission after MR resulted showing CVA  I requested consultation with the hospitalist, Dr.Kakrakandy, who agrees to see patient for admission.    Problem List / ED Course / Critical interventions / Medication management   I ordered  medication including aspirin, Plavix, Valium  Reevaluation of the patient after these medicines showed that the patient improved I have reviewed the patients home medicines and have made adjustments as needed   Test / Admission - Considered:  Patient with MR concerning for acute stroke.  Patient evaluated by neurology who did not feel the patient met LVO criteria.  Aspirin and Plavix initiated.  Patient needs admission to the hospital for further stroke workup.         Final Clinical Impression(s) / ED Diagnoses Final diagnoses:  Cerebrovascular accident (CVA), unspecified mechanism [facility])    Rx / DC Orders ED Discharge Orders     None         Pamala Duffel 02/23/23 0316    Sloan Leiter, DO 02/23/23 [phone]

## 2023-02-23 NOTE — H&P (Signed)
History and Physical    Lynn Ray ZOX:[id number] DOB: 1934-05-05 DOA: 02/22/2023  Patient coming from: Home.  Chief Complaint: Difficulty speaking.  HPI: Lynn Ray is a 89 y.o. female with history of hypertension, postoperative hypothyroidism, chronic kidney disease stage IV, chronic anemia, MGUS, prediabetes, rectal cancer was brought to the ER after patient was noticed to have difficulty expressing words.  As per patient's daughter who provided most of the history patient's son initially called yesterday morning at around 10 AM when patient talked normally.  At around 1 PM patient's son again called on the patient through the phone when patient's son felt that patient had some difficulty talking.  Later last evening when patient's daughter came to check on the patient patient was finding it difficult to express words.  Patient was brought to the ER for stroke workup.  There was also some concern for left facial droop.  ED Course: In the ER MRI brain shows acute ischemic stroke in the right frontal and left thalamic area.  On-call neurologist was consulted.  EKG shows normal sinus rhythm.  Labs show creatinine of 2.1 hemoglobin 10.5 blood glucose of 158.  Review of Systems: As per HPI, rest all negative.   Past Medical History:  Diagnosis Date   Arthritis    Cancer [facility]) 2004   rectal cancer   Cataract    right eye   Chiari malformation    CKD (chronic kidney disease), stage IV (HCC)    Depression    Diabetes mellitus (HCC)    "pre"   Hypertension    Sleep apnea    no CPAP   Thyroid disease    hyporthyroid    Past Surgical History:  Procedure Laterality Date   CATARACT EXTRACTION Bilateral    chiari malformation  2013   surgery   COLONOSCOPY     FLEXIBLE SIGMOIDOSCOPY     POLYPECTOMY       reports that she has quit smoking. She has never used smokeless tobacco. She reports that she does not drink alcohol and does not use drugs.  Allergies  Allergen Reactions    Aspirin Other (See Comments)    Severe abdominal pain   Tape Rash    adhesive    Family History  Problem Relation Age of Onset   Alzheimer's disease Mother    Stroke Mother        mini   Heart attack Mother    Hypertension Mother    Depression Other    Anemia Other    Obesity Other    Colon cancer Neg Hx    Esophageal cancer Neg Hx    Stomach cancer Neg Hx     Prior to Admission medications   Medication Sig Start Date End Date Taking? Authorizing Provider  amoxicillin-clavulanate (AUGMENTIN) 250-125 MG tablet Take 1 tablet by mouth 2 (two) times daily. 10/17/22   Raspet, Noberto Retort, PA-C  cholecalciferol (VITAMIN D3) 25 MCG (1000 UT) tablet Take 1,000 Units by mouth daily.    [provider]  hydrochlorothiazide (HYDRODIURIL) 25 MG tablet Take 0.5 tablets (12.5 mg total) by mouth daily. 12/01/20   Johney Maine, MD  levothyroxine (SYNTHROID) 100 MCG tablet TAKE 1 TABLET (100 MCG TOTAL) BY MOUTH DAILY. FOLLOW-UP DUE IN [place] WITH LABS 11/28/22   [name], MD  metoprolol succinate (TOPROL-XL) 50 MG 24 hr tablet TAKE 1 TABLET BY MOUTH EVERY DAY 01/05/20   [name], MD  Tiotropium Bromide Monohydrate (SPIRIVA RESPIMAT) 2.5  MCG/ACT AERS Inhale 2 puffs into the lungs daily. 10/17/22   Raspet, Noberto Retort, PA-C  vitamin B-12 (CYANOCOBALAMIN) 100 MCG tablet Take 100 mcg by mouth daily.    [provider]    Physical Exam: Constitutional: Moderately built and nourished. Vitals:   02/22/23 2300 02/22/23 2315 02/23/23 0200 02/23/23 0300  BP: 138/68 (!) 143/50 136/63 (!) 135/50  Pulse: 66 70 80 71  Resp: 15 16 14 17   Temp:   98.2 F (36.8 C)   TempSrc:   Oral   SpO2: 100% 100% 100% 100%  Weight:      Height:       Eyes: Anicteric no pallor. ENMT: No discharge from the ears eyes nose or mouth. Neck: No mass felt.  No neck rigidity. Respiratory: No rhonchi or crepitations. Cardiovascular: S1-S2 heard. Abdomen: Soft nontender bowel sounds  present. Musculoskeletal: No edema. Skin: No rash. Neurologic: Alert awake oriented to her name place and person.  Moving all extremities 5 x 5.  No facial asymmetry tongue is midline pupils equal and reacting to light.   Psychiatric: Appears normal.  Normal affect.   Labs on Admission: I have personally reviewed following labs and imaging studies  CBC: Recent Labs  Lab 02/22/23 2132 02/22/23 2136  WBC 7.3  --   NEUTROABS 4.5  --   HGB 10.5* 11.2*  HCT 33.8* 33.0*  MCV 90.9  --   PLT 234  --    Basic Metabolic Panel: Recent Labs  Lab 02/22/23 2132 02/22/23 2136  NA 139 142  K 3.7 3.8  CL 109 111  CO2 22  --   GLUCOSE 158* 154*  BUN 24* 25*  CREATININE 2.19* 2.30*  CALCIUM 10.0  --    GFR: Estimated Creatinine Clearance: 16.2 mL/min (A) (by C-G formula based on SCr of 2.3 mg/dL (H)). Liver Function Tests: Recent Labs  Lab 02/22/23 2132  AST 17  ALT 10  ALKPHOS 86  BILITOT 0.4  PROT 7.2  ALBUMIN 3.5   No results for input(s): "LIPASE", "AMYLASE" in the last 168 hours. No results for input(s): "AMMONIA" in the last 168 hours. Coagulation Profile: Recent Labs  Lab 02/22/23 2132  INR 1.0   Cardiac Enzymes: No results for input(s): "CKTOTAL", "CKMB", "CKMBINDEX", "TROPONINI" in the last 168 hours. BNP (last 3 results) No results for input(s): "PROBNP" in the last 8760 hours. HbA1C: No results for input(s): "HGBA1C" in the last 72 hours. CBG: No results for input(s): "GLUCAP" in the last 168 hours. Lipid Profile: No results for input(s): "CHOL", "HDL", "LDLCALC", "TRIG", "CHOLHDL", "LDLDIRECT" in the last 72 hours. Thyroid Function Tests: No results for input(s): "TSH", "T4TOTAL", "FREET4", "T3FREE", "THYROIDAB" in the last 72 hours. Anemia Panel: No results for input(s): "VITAMINB12", "FOLATE", "FERRITIN", "TIBC", "IRON", "RETICCTPCT" in the last 72 hours. Urine analysis:    Component Value Date/Time   COLORURINE YELLOW 02/23/2023 0216    APPEARANCEUR CLEAR 02/23/2023 0216   APPEARANCEUR Clear 01/20/2015 1112   LABSPEC 1.016 02/23/2023 0216   PHURINE 5.0 02/23/2023 0216   GLUCOSEU NEGATIVE 02/23/2023 0216   HGBUR NEGATIVE 02/23/2023 0216   BILIRUBINUR NEGATIVE 02/23/2023 0216   BILIRUBINUR Negative 01/20/2015 1112   KETONESUR NEGATIVE 02/23/2023 0216   PROTEINUR NEGATIVE 02/23/2023 0216   UROBILINOGEN 0.2 01/20/2011 1418   NITRITE NEGATIVE 02/23/2023 0216   LEUKOCYTESUR NEGATIVE 02/23/2023 0216   Sepsis Labs: @LABRCNTIP (procalcitonin:4,lacticidven:4) )No results found for this or any previous visit (from the past 240 hours).   Radiological Exams on Admission: MR  BRAIN WO CONTRAST Result Date: 02/23/2023 CLINICAL DATA:  Initial evaluation for acute neuro deficit, stroke suspected. EXAM: MRI HEAD WITHOUT CONTRAST TECHNIQUE: Multiplanar, multiecho pulse sequences of the brain and surrounding structures were obtained without intravenous contrast. COMPARISON:  CT from 05/19/2022 FINDINGS: Brain: Examination mildly degraded by motion artifact. Mild age-related cerebral atrophy. Patchy T2/FLAIR hyperintensity involving the periventricular deep white matter both cerebral hemispheres, consistent with chronic small vessel ischemic disease, mild for age. Few scattered superimposed remote lacunar infarcts present about the hemispheric cerebral white matter. Remote cortical subcortical infarct noted at the posterior right frontal region. Few scattered small remote bilateral cerebellar infarcts noted. Minimal chronic hemosiderin staining noted about a few of these infarcts. Patchy restricted diffusion involving the parasagittal left frontal lobe, consistent with acute ischemic infarct (series 5, images 79, 81). Additional subcentimeter acute to subacute ischemic infarct noted at the contralateral subcortical right frontal lobe (series 5, image 80). Subcentimeter acute to early subacute infarct at the superior left thalamus (series 5, image 77).  Mild associated petechial blood products at the left frontal infarct (series 12, image 42). No other associated hemorrhage. No other acute or chronic intracranial blood products. No mass lesion, midline shift or mass effect. No hydrocephalus or extra-axial fluid collection. Pituitary gland suprasellar region within normal limits. Vascular: Major intracranial vascular flow voids are maintained. Skull and upper cervical spine: Craniocervical junction within normal limits. Bone marrow signal intensity grossly normal. No scalp soft tissue abnormality. Sinuses/Orbits: Prior bilateral ocular lens replacement. 1 cm mildly complex cystic focus noted at the lateral margin of the right orbit, nonspecific, but likely benign and of doubtful significance. Paranasal sinuses are largely clear. Small left mastoid effusion, of doubtful significance. Negative nasopharynx. Other: None. IMPRESSION: 1. Patchy acute ischemic infarct involving the parasagittal left frontal lobe. Minimal associated petechial blood products without hemorrhagic transformation or significant mass effect. 2. Additional subcentimeter acute to early subacute ischemic nonhemorrhagic infarcts involving the subcortical right frontal lobe and superior left thalamus. 3. Underlying age-related cerebral atrophy with chronic microvascular ischemic disease, with a few scattered remote lacunar infarcts about the hemispheric cerebral white matter and cerebellum. Electronically Signed   By: Rise Mu M.D.   On: 02/23/2023 02:24    EKG: Independently reviewed.  Normal sinus rhythm.  Assessment/Plan Principal Problem:   Acute CVA (cerebrovascular accident) [facility]) Active Problems:   Hypothyroidism   Essential hypertension   CKD (chronic kidney disease) stage 4, GFR 15-29 ml/min (HCC)   Anemia    Acute CVA -    appreciate neurology consult.  Patient has been placed on stroke swallow screen, neurochecks.  Check 2D echo carotid Doppler lipid panel  hemoglobin A1c.  Aspirin.  Physical therapy consult. Hypertension will allow for permissive hypertension hold hydrochlorothiazide.  Continue metoprolol. Postoperative hypothyroidism on Synthroid. Chronic kidney disease stage IV creatinine at around baseline. Chronic anemia likely from renal disease and also being followed by oncologist for MGUS.  Takes B12 supplement.  Check anemia panel.  Since patient has acute stroke will need close monitoring and more than 2 midnight stay and inpatient.   DVT prophylaxis: Lovenox. Code Status: Full code. Family Communication: Patient's daughter. Disposition Plan: Monitored bed. Consults called: Neurologist. Admission status: Inpatient.

## 2023-02-23 NOTE — ED Notes (Signed)
Patient transported to MRI

## 2023-02-23 NOTE — Progress Notes (Signed)
Patient admitted earlier this morning for acute stroke neurology has been consulted.  Patient seen and examined at bedside plan of care discussed with her and daughter at bedside.  I have reviewed patient's medical records including this morning's H&P, current vitals, labs and medications myself.  Follow further neurology recommendations.  PT/OT/SLP evaluation.

## 2023-02-24 DIAGNOSIS — I639 Cerebral infarction, unspecified: Secondary | ICD-10-CM | POA: Diagnosis not present

## 2023-02-24 LAB — BASIC METABOLIC PANEL
Anion gap: 7 (ref 5–15)
BUN: 21 mg/dL (ref 8–23)
CO2: 21 mmol/L — ABNORMAL LOW (ref 22–32)
Calcium: 9.6 mg/dL (ref 8.9–10.3)
Chloride: 109 mmol/L (ref 98–111)
Creatinine, Ser: 1.83 mg/dL — ABNORMAL HIGH (ref 0.44–1.00)
GFR, Estimated: 27 mL/min — ABNORMAL LOW (ref >60)
Glucose, Bld: 87 mg/dL (ref 70–99)
Potassium: 3.9 mmol/L (ref 3.5–5.1)
Sodium: 137 mmol/L (ref 135–145)

## 2023-02-24 LAB — CBC WITH DIFFERENTIAL/PLATELET
Abs Immature Granulocytes: 0.01 10*3/uL (ref 0.00–0.07)
Basophils Absolute: 0.1 10*3/uL (ref 0.0–0.1)
Basophils Relative: 1 %
Eosinophils Absolute: 0.4 10*3/uL (ref 0.0–0.5)
Eosinophils Relative: 7 %
HCT: 33.8 % — ABNORMAL LOW (ref 36.0–46.0)
Hemoglobin: 10.7 g/dL — ABNORMAL LOW (ref 12.0–15.0)
Immature Granulocytes: 0 %
Lymphocytes Relative: 33 %
Lymphs Abs: 2 10*3/uL (ref 0.7–4.0)
MCH: 27.9 pg (ref 26.0–34.0)
MCHC: 31.7 g/dL (ref 30.0–36.0)
MCV: 88.3 fL (ref 80.0–100.0)
Monocytes Absolute: 0.6 10*3/uL (ref 0.1–1.0)
Monocytes Relative: 9 %
Neutro Abs: 3 10*3/uL (ref 1.7–7.7)
Neutrophils Relative %: 50 %
Platelets: 222 10*3/uL (ref 150–400)
RBC: 3.83 MIL/uL — ABNORMAL LOW (ref 3.87–5.11)
RDW: 13.4 % (ref 11.5–15.5)
WBC: 6 10*3/uL (ref 4.0–10.5)
nRBC: 0 % (ref 0.0–0.2)

## 2023-02-24 LAB — MAGNESIUM: Magnesium: 2.1 mg/dL (ref 1.7–2.4)

## 2023-02-24 NOTE — Progress Notes (Signed)
Inpatient Rehab Admissions Coordinator:  ? ?Per therapy recommendations,  patient was screened for CIR candidacy by Megan Salon, MS, CCC-SLP. At this time, Pt. Appears to be a a potential candidate for CIR. I will place   order for rehab consult per protocol for full assessment. Please contact me any with questions. ? ?Megan Salon, MS, CCC-SLP ?Rehab Admissions Coordinator  ?[phone] (celll) ?[phone] (office) ? ?

## 2023-02-24 NOTE — Plan of Care (Signed)

## 2023-02-24 NOTE — Plan of Care (Signed)
Problem: Education: Goal: Knowledge of disease or condition will improve 02/24/2023 0551 by Chevis Pretty, RN Outcome: Progressing 02/24/2023 0450 by Chevis Pretty, RN Outcome: Progressing Goal: Knowledge of secondary prevention will improve (MUST DOCUMENT ALL) 02/24/2023 0551 by Chevis Pretty, RN Outcome: Progressing 02/24/2023 0450 by Chevis Pretty, RN Outcome: Progressing Goal: Knowledge of patient specific risk factors will improve (DELETE if not current risk factor) 02/24/2023 0551 by Chevis Pretty, RN Outcome: Progressing 02/24/2023 0450 by Chevis Pretty, RN Outcome: Progressing   Problem: Ischemic Stroke/TIA Tissue Perfusion: Goal: Complications of ischemic stroke/TIA will be minimized 02/24/2023 0551 by Chevis Pretty, RN Outcome: Progressing 02/24/2023 0450 by Chevis Pretty, RN Outcome: Progressing   Problem: Coping: Goal: Will verbalize positive feelings about self 02/24/2023 0551 by Chevis Pretty, RN Outcome: Progressing 02/24/2023 0450 by Chevis Pretty, RN Outcome: Progressing Goal: Will identify appropriate support needs 02/24/2023 0551 by Chevis Pretty, RN Outcome: Progressing 02/24/2023 0450 by Chevis Pretty, RN Outcome: Progressing   Problem: Health Behavior/Discharge Planning: Goal: Ability to manage health-related needs will improve 02/24/2023 0551 by Chevis Pretty, RN Outcome: Progressing 02/24/2023 0450 by Chevis Pretty, RN Outcome: Progressing Goal: Goals will be collaboratively established with patient/family 02/24/2023 0551 by Chevis Pretty, RN Outcome: Progressing 02/24/2023 0450 by Chevis Pretty, RN Outcome: Progressing   Problem: Self-Care: Goal: Ability to participate in self-care as condition permits will improve 02/24/2023 0551 by Chevis Pretty, RN Outcome: Progressing 02/24/2023 0450 by Chevis Pretty, RN Outcome: Progressing Goal: Verbalization of feelings and concerns over difficulty with self-care will improve 02/24/2023 0551 by Chevis Pretty,  RN Outcome: Progressing 02/24/2023 0450 by Chevis Pretty, RN Outcome: Progressing Goal: Ability to communicate needs accurately will improve 02/24/2023 0551 by Chevis Pretty, RN Outcome: Progressing 02/24/2023 0450 by Chevis Pretty, RN Outcome: Progressing   Problem: Nutrition: Goal: Risk of aspiration will decrease 02/24/2023 0551 by Chevis Pretty, RN Outcome: Progressing 02/24/2023 0450 by Chevis Pretty, RN Outcome: Progressing Goal: Dietary intake will improve 02/24/2023 0551 by Chevis Pretty, RN Outcome: Progressing 02/24/2023 0450 by Chevis Pretty, RN Outcome: Progressing   Problem: Education: Goal: Knowledge of General Education information will improve Description: Including pain rating scale, medication(s)/side effects and non-pharmacologic comfort measures 02/24/2023 0551 by Chevis Pretty, RN Outcome: Progressing 02/24/2023 0450 by Chevis Pretty, RN Outcome: Progressing   Problem: Health Behavior/Discharge Planning: Goal: Ability to manage health-related needs will improve 02/24/2023 0551 by Chevis Pretty, RN Outcome: Progressing 02/24/2023 0450 by Chevis Pretty, RN Outcome: Progressing   Problem: Clinical Measurements: Goal: Ability to maintain clinical measurements within normal limits will improve 02/24/2023 0551 by Chevis Pretty, RN Outcome: Progressing 02/24/2023 0450 by Chevis Pretty, RN Outcome: Progressing Goal: Will remain free from infection 02/24/2023 0551 by Chevis Pretty, RN Outcome: Progressing 02/24/2023 0450 by Chevis Pretty, RN Outcome: Progressing Goal: Diagnostic test results will improve 02/24/2023 0551 by Chevis Pretty, RN Outcome: Progressing 02/24/2023 0450 by Chevis Pretty, RN Outcome: Progressing Goal: Respiratory complications will improve 02/24/2023 0551 by Chevis Pretty, RN Outcome: Progressing 02/24/2023 0450 by Chevis Pretty, RN Outcome: Progressing Goal: Cardiovascular complication will be avoided 02/24/2023 0551 by Chevis Pretty,  RN Outcome: Progressing 02/24/2023 0450 by Chevis Pretty, RN Outcome: Progressing   Problem: Activity: Goal: Risk for activity intolerance will decrease 02/24/2023 0551 by Chevis Pretty, RN Outcome: Progressing 02/24/2023 0450 by  Arnetia Bronk R, RN Outcome: Progressing   Problem: Nutrition: Goal: Adequate nutrition will be maintained 02/24/2023 0551 by Chevis Pretty, RN Outcome: Progressing 02/24/2023 0450 by Chevis Pretty, RN Outcome: Progressing   Problem: Coping: Goal: Level of anxiety will decrease 02/24/2023 0551 by Chevis Pretty, RN Outcome: Progressing 02/24/2023 0450 by Chevis Pretty, RN Outcome: Progressing   Problem: Elimination: Goal: Will not experience complications related to bowel motility 02/24/2023 0551 by Chevis Pretty, RN Outcome: Progressing 02/24/2023 0450 by Chevis Pretty, RN Outcome: Progressing Goal: Will not experience complications related to urinary retention 02/24/2023 0551 by Chevis Pretty, RN Outcome: Progressing 02/24/2023 0450 by Chevis Pretty, RN Outcome: Progressing   Problem: Pain Managment: Goal: General experience of comfort will improve and/or be controlled 02/24/2023 0551 by Chevis Pretty, RN Outcome: Progressing 02/24/2023 0450 by Chevis Pretty, RN Outcome: Progressing   Problem: Safety: Goal: Ability to remain free from injury will improve 02/24/2023 0551 by Chevis Pretty, RN Outcome: Progressing 02/24/2023 0450 by Chevis Pretty, RN Outcome: Progressing   Problem: Skin Integrity: Goal: Risk for impaired skin integrity will decrease 02/24/2023 0551 by Chevis Pretty, RN Outcome: Progressing 02/24/2023 0450 by Chevis Pretty, RN Outcome: Progressing

## 2023-02-24 NOTE — Progress Notes (Addendum)
PT Cancellation Note  Patient Details Name: Lynn Ray MRN: [id number] DOB: July 04, 1934   Cancelled Treatment:    Reason Eval/Treat Not Completed: (P) Other (comment) (pt had just gotten back to bed with assist from OT, fatigued.) Will continue efforts per PT plan of care as schedule permits.   Dorathy Kinsman [name] 02/24/2023, 4:50 PM

## 2023-02-24 NOTE — Evaluation (Signed)
Speech Language Pathology Evaluation Patient Details Name: Lynn Ray MRN: [id number] DOB: [birth year] Today's Date: 02/24/2023 Time: 4098-1191 SLP Time Calculation (min) (ACUTE ONLY): 32 min  Problem List:  Patient Active Problem List   Diagnosis Date Noted   Acute CVA (cerebrovascular accident) (HCC) 02/23/2023   Anemia 02/23/2023   Vision changes 04/18/2022   Pre-diabetes 09/29/2020   CKD (chronic kidney disease) stage 4, GFR 15-29 ml/min (HCC) 12/22/2019   History of Chiari malformation 10/09/2017   Chronic right shoulder pain 10/09/2017   Routine general medical examination at a health care facility 07/13/2017   History of malignant neoplasm of large intestine 11/18/2010   BARRETT'S ESOPHAGUS 01/14/2008   Hypothyroidism 12/10/2007   Morbid obesity (HCC) 12/10/2007   Essential hypertension 12/10/2007   AORTIC VALVE DISORDERS 12/10/2007   COPD 12/10/2007   GERD 12/09/2007   Past Medical History:  Past Medical History:  Diagnosis Date   Arthritis    Cancer (HCC) 2004   rectal cancer   Cataract    right eye   Chiari malformation    CKD (chronic kidney disease), stage IV (HCC)    Depression    Diabetes mellitus (HCC)    "pre"   Hypertension    Sleep apnea    no CPAP   Thyroid disease    hyporthyroid   Past Surgical History:  Past Surgical History:  Procedure Laterality Date   CATARACT EXTRACTION Bilateral    chiari malformation  2013   surgery   COLONOSCOPY     FLEXIBLE SIGMOIDOSCOPY     POLYPECTOMY     HPI:  89yo female admitted 02/22/23 with difficulty speaking. PMH: HTN, post-op hypothyroidism, CKD4, chronic anemia, MGUS, prediabetes, rectal cancer. MRI = patchy acute left frontal lobe ischemic infarcts; (sub)acute infarcts in right frontal lobe and left thalamus.   Assessment / Plan / Recommendation Clinical Impression  Pt seen at bedside for cognitive linguistic evaluation. At baseline, pt reports a master's degree in counselling. She was fully  independent prior to admit, managing meds and finances independently. Today, pt presents with fully intelligible speech. No obvious orofacial weakness or asymmetry. Pt does report slight weakness on the right side, but this is not visualized. Receptive Language appears intact. Expressive Language has improved per daughter. Mild word retrieval deficits noted during verbal fluency task. The [employer] Mental Status (SLUMS) Examination was administered. Pt scored 23/30 points, indicating mild neurocognitive deficits. Points were lost on mental math, verbal fluency, auditory attention and recall, and clock drawing. This raises concern for potential difficulty with money management, attention, functional recall, and executive functions. Recommend 24 hour supervision at discharge to maximize safety. Consider outpatient or home health ST if needs arise upon return to normal routines.    SLP Assessment  SLP Recommendation/Assessment: All further Speech Language Pathology needs can be addressed in the next venue of care if needs arise.  SLP Visit Diagnosis: Cognitive communication deficit (R41.841)    Recommendations for follow up therapy are one component of a multi-disciplinary discharge planning process, led by the attending physician.  Recommendations may be updated based on patient status, additional functional criteria and insurance authorization.    Follow Up Recommendations  Follow physician's recommendations for discharge plan and follow up therapies (OP/HH ST if needs arise.)    Assistance Recommended at Discharge  Frequent or constant Supervision/Assistance  Functional Status Assessment Patient has had a recent decline in their functional status and demonstrates the ability to make significant improvements in function in a reasonable and  predictable amount of time.     SLP Evaluation Cognition  Overall Cognitive Status: Impaired/Different from baseline Arousal/Alertness:  Awake/alert Orientation Level: Oriented X4       Comprehension  Auditory Comprehension Overall Auditory Comprehension: Appears within functional limits for tasks assessed    Expression Expression Primary Mode of Expression: Verbal Verbal Expression Overall Verbal Expression: Impaired Initiation: No impairment Repetition: No impairment Naming: Impairment Written Expression Dominant Hand: Right   Oral / Motor  Oral Motor/Sensory Function Overall Oral Motor/Sensory Function: Within functional limits Motor Speech Overall Motor Speech: Appears within functional limits for tasks assessed Respiration: Within functional limits Phonation: Normal Resonance: Within functional limits Articulation: Within functional limitis Intelligibility: Intelligible Motor Planning: Witnin functional limits Motor Speech Errors: Not applicable           Lynn Ray, [facility], CCC-SLP Speech Language Pathologist Office: [phone]  Lynn Ray 02/24/2023, 9:57 AM

## 2023-02-24 NOTE — Consult Note (Signed)
VAT: Consult placed for PIV  PIV not placed d/t IV meds ordered at this time. Care RN notified via secure chat.   New consult can be placed if pt status changed.   Consult complete

## 2023-02-24 NOTE — Evaluation (Addendum)
Occupational Therapy Evaluation Patient Details Name: Lynn Ray MRN: [id number] DOB: 09-11-1934 Today's Date: 02/24/2023   History of Present Illness 89 yo female demonstrates expressive deficits and L facial droop. 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   Clinical Impression   Pt presented with daughter in the room and was motivated to participate in session. She was able to complete sit to stand transfers with supervision to min guard to sequence with FW. Then she was able to ambulate to the sink to complete oral care and washing face with min guard. Pt then required a rest but then agreed to complete ambulation in room level with min guard. Pt and daughter educated about the different levels of therapy and would like to go to AIR at this time. Patient will benefit from intensive inpatient follow up therapy, >3 hours/day.      If plan is discharge home, recommend the following: A little help with walking and/or transfers;A little help with bathing/dressing/bathroom;Assistance with cooking/housework;Direct supervision/assist for medications management;Direct supervision/assist for financial management;Assist for transportation;Help with stairs or ramp for entrance    Functional Status Assessment  Patient has had a recent decline in their functional status and demonstrates the ability to make significant improvements in function in a reasonable and predictable amount of time.  Equipment Recommendations   (TBD but may need FW)    Recommendations for Other Services Rehab consult     Precautions / Restrictions Precautions Precautions: Fall Precaution Comments: watch bp Restrictions Weight Bearing Restrictions Per Provider Order: No      Mobility Bed Mobility   Bed Mobility: Supine to Sit     Supine to sit: Supervision     General bed mobility comments: Pt presented in chair    Transfers Overall  transfer level: Needs assistance Equipment used: Rolling walker (2 wheels) Transfers: Sit to/from Stand Sit to Stand: Supervision, Contact guard assist                  Balance Overall balance assessment: Needs assistance Sitting-balance support: Feet supported Sitting balance-Leahy Scale: Good     Standing balance support: Bilateral upper extremity supported Standing balance-Leahy Scale: Fair Standing balance comment: Pt noted to have decrease in strength in BLE while standing and required to sit prior to more activity                           ADL either performed or assessed with clinical judgement   ADL Overall ADL's : Needs assistance/impaired Eating/Feeding: Independent;Sitting   Grooming: Wash/dry hands;Wash/dry face;Oral care;Contact guard assist;Standing   Upper Body Bathing: Contact guard assist;Sitting   Lower Body Bathing: Minimal assistance;Sit to/from stand   Upper Body Dressing : Contact guard assist;Sitting   Lower Body Dressing: Minimal assistance;Sit to/from stand   Toilet Transfer: Contact guard assist;Rolling walker (2 wheels)   Toileting- Clothing Manipulation and Hygiene: Contact guard assist;Sit to/from stand       Functional mobility during ADLs: Minimal assistance;Rolling walker (2 wheels)       Vision Baseline Vision/History: 1 Wears glasses Patient Visual Report:  (Pt noted to have slightly decrease in pepheral on r side)       Perception         Praxis         Pertinent Vitals/Pain Pain Assessment Pain Assessment: No/denies pain     Extremity/Trunk Assessment Upper Extremity Assessment Upper Extremity  Assessment: Generalized weakness;RUE deficits/detail RUE Deficits / Details: decrease in FM coordination and strength in RUE RUE Sensation: WNL RUE Coordination: decreased gross motor   Lower Extremity Assessment Lower Extremity Assessment: Defer to PT evaluation   Cervical / Trunk Assessment Cervical /  Trunk Assessment: Normal   Communication Communication Communication: No apparent difficulties   Cognition Arousal: Alert Behavior During Therapy: WFL for tasks assessed/performed Overall Cognitive Status: Impaired/Different from baseline Area of Impairment: Problem solving                       Following Commands: Follows one step commands consistently Safety/Judgement: Decreased awareness of safety, Decreased awareness of deficits   Problem Solving: Slow processing, Decreased initiation, Difficulty sequencing, Requires verbal cues, Requires tactile cues General Comments: Per family reported to have significant increase in ability to complete tasks     General Comments       Exercises     Shoulder Instructions      Home Living Family/patient expects to be discharged to:: Private residence Living Arrangements: Alone Available Help at Discharge: Family;Available PRN/intermittently Type of Home: House Home Access: Stairs to enter [employer] of Steps: 3 Entrance Stairs-Rails: Right Home Layout: Multi-level Alternate Level Stairs-Number of Steps: 6 (main level to bedroom) Alternate Level Stairs-Rails: Right (going up) Bathroom Shower/Tub: Tub/shower unit;Curtain   Bathroom Toilet: Standard Bathroom Accessibility:  (unsure)   Home Equipment: Shower seat;Cane - single point      Lives With: Alone (has assist over night)    Prior Functioning/Environment Prior Level of Function : Independent/Modified Independent;Driving             Mobility Comments: was driving ADLs Comments: cooking and cleaning support as needed but will cook for family        OT Problem List: Decreased strength;Decreased activity tolerance;Impaired balance (sitting and/or standing);Impaired vision/perception;Decreased knowledge of use of DME or AE;Decreased safety awareness;Cardiopulmonary status limiting activity      OT Treatment/Interventions: Self-care/ADL  training;Neuromuscular education;DME and/or AE instruction;Therapeutic activities;Visual/perceptual remediation/compensation;Patient/family education;Balance training    OT Goals(Current goals can be found in the care plan section) Acute Rehab OT Goals Patient Stated Goal: to get stronger OT Goal Formulation: With patient Time For Goal Achievement: 03/10/23 Potential to Achieve Goals: Good  OT Frequency: Min 1X/week    Co-evaluation              AM-PAC OT "6 Clicks" Daily Activity     Outcome Measure Help from another person eating meals?: None Help from another person taking care of personal grooming?: A Little Help from another person toileting, which includes using toliet, bedpan, or urinal?: A Little Help from another person bathing (including washing, rinsing, drying)?: A Little Help from another person to put on and taking off regular upper body clothing?: A Little Help from another person to put on and taking off regular lower body clothing?: A Little 6 Click Score: 19   End of Session Equipment Utilized During Treatment: Gait belt;Rolling walker (2 wheels) Nurse Communication: Mobility status  Activity Tolerance: Patient tolerated treatment well Patient left: in bed;with call bell/phone within reach;with bed alarm set  OT Visit Diagnosis: Unsteadiness on feet (R26.81);Other abnormalities of gait and mobility (R26.89);Muscle weakness (generalized) (M62.81)                Time: 1914-7829 OT Time Calculation (min): 48 min Charges:  OT General Charges $OT Visit: 1 Visit OT Evaluation $OT Eval Low Complexity: 1 Low OT Treatments $Self Care/Home Management :  38-52 mins  Presley Raddle OTR/L  Acute Rehab Services  [phone] office number   Alphia Moh 02/24/2023, 4:53 PM

## 2023-02-24 NOTE — Progress Notes (Signed)
PROGRESS NOTE                                                                                                                                                                                                             Patient Demographics:    Lynn Ray, is a 89 y.o. female, DOB - 06-22-1934, GNF:[id number]  Outpatient Primary MD for the patient is [name], MD    LOS - 1  Admit date - 02/22/2023    Chief Complaint  Patient presents with   Aphasia   Facial Droop       Brief Narrative (HPI from H&P)   89 y.o. female with history of hypertension, postoperative hypothyroidism, chronic kidney disease stage IV, chronic anemia, MGUS, prediabetes, rectal cancer was brought to the ER after patient was noticed to have difficulty expressing words.  As per patient's daughter who provided most of the history patient's son initially called yesterday morning at around 10 AM when patient talked normally.  At around 1 PM patient's son again called on the patient through the phone when patient's son felt that patient had some difficulty talking.  Later last evening when patient's daughter came to check on the patient patient was finding it difficult to express words.  Patient was brought to the ER for stroke workup.  There was also some concern for left facial droop.  The workup confirmed acute ischemic stroke in the right frontal and left thalamic area and she was admitted.   Subjective:    Lynn Ray today has, No headache, No chest pain, No abdominal pain - No Nausea, No new weakness tingling or numbness, no shortness of breath.  Speech much improved.   Assessment  & Plan :    Acute CVA -    appreciate neurology consult.  Stroke workup done, currently on DAPT for 3 weeks thereafter aspirin alone, LDL above goal hence added statin, PT OT and speech eval, may require CIR/rehab.  Stable echo and carotid duplex.  A1c stable, needs a  30-day event monitor upon discharge. Hypertension will allow for permissive hypertension hold hydrochlorothiazide.  Continue metoprolol. Postoperative hypothyroidism on Synthroid. Chronic kidney disease stage IV creatinine at around baseline. Chronic anemia likely from renal disease and also being followed by oncologist for MGUS.  Takes B12 supplement.  Check anemia panel. Dyslipidemia.  Placed on  statin.         Condition -  Fair  Family Communication  : Daughter bedside 02/24/2023  Code Status : Full code  Consults  : Neurology  PUD Prophylaxis :    Procedures  :            Disposition Plan  :    Status is: Inpatient   DVT Prophylaxis  :    enoxaparin (LOVENOX) injection 30 mg Start: 02/23/23 1000    Lab Results  Component Value Date   PLT 222 02/24/2023    Diet :  Diet Order             Diet Heart Room service appropriate? Yes; Fluid consistency: Thin  Diet effective now                    Inpatient Medications  Scheduled Meds:  aspirin  300 mg Rectal Daily   Or   aspirin EC  325 mg Oral Daily   atorvastatin  40 mg Oral Daily   clopidogrel  75 mg Oral Daily   cyanocobalamin  1,000 mcg Oral Daily   enoxaparin (LOVENOX) injection  30 mg Subcutaneous Daily   levothyroxine  100 mcg Oral Daily   Continuous Infusions: PRN Meds:.acetaminophen **OR** acetaminophen (TYLENOL) oral liquid 160 mg/5 mL **OR** acetaminophen  Antibiotics  :    Anti-infectives (From admission, onward)    None         Objective:   Vitals:   02/23/23 1800 02/23/23 1925 02/23/23 2000 02/24/23 0451  BP: (!) 145/65 (!) 143/63 (!) 155/52   Pulse: 72 71 71   Resp: 17 17 17    Temp:  99 F (37.2 C) 98.2 F (36.8 C)   TempSrc:  Oral Oral   SpO2: 100% 100% 98%   Weight:    79.5 kg  Height:        Wt Readings from Last 3 Encounters:  02/24/23 79.5 kg  10/17/22 79.4 kg  07/21/22 79.8 kg     Intake/Output Summary (Last 24 hours) at 02/24/2023 1011 Last data  filed at 02/24/2023 0400 Gross per 24 hour  Intake 400 ml  Output --  Net 400 ml     Physical Exam  Awake Alert, No new F.N deficits, Normal affect [place].AT,PERRAL Supple Neck, No JVD,   Symmetrical Chest wall movement, Good air movement bilaterally, CTAB RRR,No Gallops,Rubs or new Murmurs,  +ve B.Sounds, Abd Soft, No tenderness,   No Cyanosis, Clubbing or edema        Data Review:    Recent Labs  Lab 02/22/23 2132 02/22/23 2136 02/23/23 0458 02/24/23 0514  WBC 7.3  --  6.8 6.0  HGB 10.5* 11.2* 9.9* 10.7*  HCT 33.8* 33.0* 31.5* 33.8*  PLT 234  --  220 222  MCV 90.9  --  89.7 88.3  MCH 28.2  --  28.2 27.9  MCHC 31.1  --  31.4 31.7  RDW 13.4  --  13.5 13.4  LYMPHSABS 2.0  --   --  2.0  MONOABS 0.4  --   --  0.6  EOSABS 0.3  --   --  0.4  BASOSABS 0.0  --   --  0.1    Recent Labs  Lab 02/22/23 2132 02/22/23 2136 02/23/23 0458 02/24/23 0514  NA 139 142  --  137  K 3.7 3.8  --  3.9  CL 109 111  --  109  CO2 22  --   --  21*  ANIONGAP 8  --   --  7  GLUCOSE 158* 154*  --  87  BUN 24* 25*  --  21  CREATININE 2.19* 2.30* 2.09* 1.83*  AST 17  --   --   --   ALT 10  --   --   --   ALKPHOS 86  --   --   --   BILITOT 0.4  --   --   --   ALBUMIN 3.5  --   --   --   INR 1.0  --   --   --   HGBA1C  --   --  5.8*  --   MG  --   --   --  2.1  CALCIUM 10.0  --   --  9.6      Recent Labs  Lab 02/22/23 2132 02/23/23 0458 02/24/23 0514  INR 1.0  --   --   HGBA1C  --  5.8*  --   MG  --   --  2.1  CALCIUM 10.0  --  9.6    --------------------------------------------------------------------------------------------------------------- Lab Results  Component Value Date   CHOL 156 02/23/2023   HDL 49 02/23/2023   LDLCALC 84 02/23/2023   LDLDIRECT 78.0 07/21/2022   TRIG 115 02/23/2023   CHOLHDL 3.2 02/23/2023    Lab Results  Component Value Date   HGBA1C 5.8 (H) 02/23/2023   No results for input(s): "TSH", "T4TOTAL", "FREET4", "T3FREE", "THYROIDAB" in the  last 72 hours. No results for input(s): "VITAMINB12", "FOLATE", "FERRITIN", "TIBC", "IRON", "RETICCTPCT" in the last 72 hours. ------------------------------------------------------------------------------------------------------------------ Cardiac Enzymes No results for input(s): "CKMB", "TROPONINI", "MYOGLOBIN" in the last 168 hours.  Invalid input(s): "CK"  Micro Results No results found for this or any previous visit (from the past 240 hours).  Radiology Reports ECHOCARDIOGRAM COMPLETE Result Date: 02/23/2023    ECHOCARDIOGRAM REPORT   Patient Name:   JENNIKA RINGGOLD Date of Exam: 02/23/2023 Medical Rec #:  [id number]    Height:       59.5 in Accession #:    [id number]   Weight:       175.0 lb Date of Birth:  11/19/1934    BSA:          1.753 m Patient Age:    88 years     BP:           107/89 mmHg Patient Gender: F            HR:           65 bpm. Exam Location:  Inpatient Procedure: 2D Echo, Cardiac Doppler and Color Doppler Indications:    Stroke  History:        Patient has no prior history of Echocardiogram examinations.                 COPD; Risk Factors:Hypertension.  Sonographer:    Amy Chionchio Referring Phys: [id number] ARSHAD N KAKRAKANDY IMPRESSIONS  1. Left ventricular ejection fraction, by estimation, is 55 to 60%. Left ventricular ejection fraction by PLAX is 59 %. The left ventricle has normal function. The left ventricle has no regional wall motion abnormalities. Left ventricular diastolic parameters are consistent with Grade I diastolic dysfunction (impaired relaxation).  2. Right ventricular systolic function is normal. The right ventricular size is normal. There is normal pulmonary artery systolic pressure.  3. The mitral valve is normal in structure. Mild mitral valve regurgitation. No evidence of mitral stenosis.  4. The  aortic valve is tricuspid. Aortic valve regurgitation is not visualized. No aortic stenosis is present.  5. The inferior vena cava is normal in size with greater than  50% respiratory variability, suggesting right atrial pressure of 3 mmHg. FINDINGS  Left Ventricle: Left ventricular ejection fraction, by estimation, is 55 to 60%. Left ventricular ejection fraction by PLAX is 59 %. The left ventricle has normal function. The left ventricle has no regional wall motion abnormalities. The left ventricular internal cavity size was normal in size. There is no left ventricular hypertrophy. Left ventricular diastolic parameters are consistent with Grade I diastolic dysfunction (impaired relaxation). Normal left ventricular filling pressure. Right Ventricle: The right ventricular size is normal. No increase in right ventricular wall thickness. Right ventricular systolic function is normal. There is normal pulmonary artery systolic pressure. The tricuspid regurgitant velocity is 2.55 m/s, and  with an assumed right atrial pressure of 3 mmHg, the estimated right ventricular systolic pressure is 29.0 mmHg. Left Atrium: Left atrial size was normal in size. Right Atrium: Right atrial size was normal in size. Pericardium: There is no evidence of pericardial effusion. Mitral Valve: The mitral valve is normal in structure. Mild mitral valve regurgitation. No evidence of mitral valve stenosis. MV peak gradient, 3.8 mmHg. The mean mitral valve gradient is 1.0 mmHg. Tricuspid Valve: The tricuspid valve is normal in structure. Tricuspid valve regurgitation is mild . No evidence of tricuspid stenosis. Aortic Valve: The aortic valve is tricuspid. Aortic valve regurgitation is not visualized. No aortic stenosis is present. Aortic valve mean gradient measures 3.0 mmHg. Aortic valve peak gradient measures 4.5 mmHg. Aortic valve area, by VTI measures 3.08 cm. Pulmonic Valve: The pulmonic valve was normal in structure. Pulmonic valve regurgitation is trivial. No evidence of pulmonic stenosis. Aorta: The aortic root is normal in size and structure. Venous: The inferior vena cava is normal in size with  greater than 50% respiratory variability, suggesting right atrial pressure of 3 mmHg. IAS/Shunts: No atrial level shunt detected by color flow Doppler.  LEFT VENTRICLE PLAX 2D LV EF:         Left            Diastology                ventricular     LV e' medial:    7.40 cm/s                ejection        LV E/e' medial:  8.7                fraction by     LV e' lateral:   9.14 cm/s                PLAX is 59      LV E/e' lateral: 7.1                %. LVIDd:         3.90 cm LVIDs:         2.70 cm LV PW:         1.00 cm LV IVS:        0.90 cm LVOT diam:     2.10 cm LV SV:         70 LV SV Index:   40 LVOT Area:     3.46 cm  LV Volumes (MOD) LV vol d, MOD    60.9 ml A2C: LV vol d, MOD  76.1 ml A4C: LV vol s, MOD    23.7 ml A2C: LV vol s, MOD    38.2 ml A4C: LV SV MOD A2C:   37.2 ml LV SV MOD A4C:   76.1 ml LV SV MOD BP:    39.4 ml RIGHT VENTRICLE            IVC RV Basal diam:  2.80 cm    IVC diam: 1.50 cm RV S prime:     9.68 cm/s TAPSE (M-mode): 2.0 cm LEFT ATRIUM             Index        RIGHT ATRIUM           Index LA Vol (A2C):   36.2 ml 20.65 ml/m  RA Area:     10.40 cm LA Vol (A4C):   35.4 ml 20.20 ml/m  RA Volume:   21.00 ml  11.98 ml/m LA Biplane Vol: 38.6 ml 22.02 ml/m  AORTIC VALVE                    PULMONIC VALVE AV Area (Vmax):    3.01 cm     PV Vmax:          0.76 m/s AV Area (Vmean):   2.57 cm     PV Peak grad:     2.3 mmHg AV Area (VTI):     3.08 cm     PR End Diast Vel: 4.41 msec AV Vmax:           106.00 cm/s AV Vmean:          77.600 cm/s AV VTI:            0.227 m AV Peak Grad:      4.5 mmHg AV Mean Grad:      3.0 mmHg LVOT Vmax:         92.00 cm/s LVOT Vmean:        57.600 cm/s LVOT VTI:          0.202 m LVOT/AV VTI ratio: 0.89  AORTA Ao Root diam: 2.80 cm Ao Asc diam:  2.60 cm MITRAL VALVE               TRICUSPID VALVE MV Area (PHT): 3.28 cm    TR Peak grad:   26.0 mmHg MV Area VTI:   2.60 cm    TR Vmax:        255.00 cm/s MV Peak grad:  3.8 mmHg MV Mean grad:  1.0 mmHg    SHUNTS MV  Vmax:       0.97 m/s    Systemic VTI:  0.20 m MV Vmean:      49.5 cm/s   Systemic Diam: 2.10 cm MV E velocity: 64.70 cm/s MV A velocity: 78.40 cm/s MV E/A ratio:  0.83 Chilton Si MD Electronically signed by Chilton Si MD Signature Date/Time: 02/23/2023/5:54:41 PM    Final    VAS US CAROTID (at [facility] and [facility] only) Result Date: 02/23/2023 Carotid Arterial Duplex Study Patient Name:  ELDENA DEDE  Date of Exam:   02/23/2023 Medical Rec #: [id number]     Accession #:    [id number] Date of Birth: 1934/10/18     Patient Gender: F Patient Age:   49 years Exam Location:  [facility] Procedure:      VAS US CAROTID Referring Phys: Midge Minium --------------------------------------------------------------------------------  Indications:   CVA and Speech disturbance. Risk Factors:  Hypertension,  hyperlipidemia, Diabetes. Other Factors: Hypothyroidism, CKD stage IV. Performing Technologist: Marilynne Halsted RDMS, RVT  Examination Guidelines: A complete evaluation includes B-mode imaging, spectral Doppler, color Doppler, and power Doppler as needed of all accessible portions of each vessel. Bilateral testing is considered an integral part of a complete examination. Limited examinations for reoccurring indications may be performed as noted.  Right Carotid Findings: +----------+--------+--------+--------+------------------+------------------+           PSV cm/sEDV cm/sStenosisPlaque DescriptionComments           +----------+--------+--------+--------+------------------+------------------+ CCA Prox  67      12                                                   +----------+--------+--------+--------+------------------+------------------+ CCA Distal66      15                                intimal thickening +----------+--------+--------+--------+------------------+------------------+ ICA Prox  79      17                                intimal thickening  +----------+--------+--------+--------+------------------+------------------+ ICA Distal77      21                                                   +----------+--------+--------+--------+------------------+------------------+ ECA       76      10                                                   +----------+--------+--------+--------+------------------+------------------+ +----------+--------+-------+----------------+-------------------+           PSV cm/sEDV cmsDescribe        Arm Pressure (mmHG) +----------+--------+-------+----------------+-------------------+ [id number]             Multiphasic, WNL                    +----------+--------+-------+----------------+-------------------+ +---------+--------+--+--------+--+---------+ VertebralPSV cm/s34EDV cm/s10Antegrade +---------+--------+--+--------+--+---------+  Left Carotid Findings: +----------+--------+--------+--------+------------------+------------------+           PSV cm/sEDV cm/sStenosisPlaque DescriptionComments           +----------+--------+--------+--------+------------------+------------------+ CCA Prox  72      17                                                   +----------+--------+--------+--------+------------------+------------------+ CCA Distal62      19                                intimal thickening +----------+--------+--------+--------+------------------+------------------+ ICA Prox  54      14                                                   +----------+--------+--------+--------+------------------+------------------+  ICA Mid   70      25                                                   +----------+--------+--------+--------+------------------+------------------+ ICA Distal111     27                                tortuous           +----------+--------+--------+--------+------------------+------------------+ ECA       60      9                                                     +----------+--------+--------+--------+------------------+------------------+ +----------+--------+--------+----------------+-------------------+           PSV cm/sEDV cm/sDescribe        Arm Pressure (mmHG) +----------+--------+--------+----------------+-------------------+ [id number]             Multiphasic, WNL                    +----------+--------+--------+----------------+-------------------+ +---------+--------+--+--------+--+ VertebralPSV cm/s68EDV cm/s10 +---------+--------+--+--------+--+   Summary: Right Carotid: The extracranial vessels were near-normal with only minimal wall                thickening or plaque. Left Carotid: The extracranial vessels were near-normal with only minimal wall               thickening or plaque. Vertebrals:  Bilateral vertebral arteries demonstrate antegrade flow. Subclavians: Normal flow hemodynamics were seen in bilateral subclavian              arteries. *See table(s) above for measurements and observations.  Electronically signed by Delia Heady MD on 02/23/2023 at 5:08:22 PM.    Final    MR ANGIO HEAD WO CONTRAST Result Date: 02/23/2023 CLINICAL DATA:  Provided history: Stroke, follow-up. EXAM: MRA HEAD WITHOUT CONTRAST TECHNIQUE: Angiographic images of the Circle of Willis were acquired using MRA technique without intravenous contrast. COMPARISON:  Brain MRI 02/23/2023. FINDINGS: Anterior circulation: The intracranial internal carotid arteries are patent. The M1 middle cerebral arteries are patent. No M2 proximal branch occlusion or high-grade proximal stenosis. The anterior cerebral arteries are patent. 2 mm posteriorly projecting vascular protrusion arising from the right ICA at the petrous-cavernous junction, which may reflect an aneurysm or infundibulum (series 350, image 4). 2 mm laterally projecting vascular protrusion arising from the cavernous right internal carotid artery, which may reflect an aneurysm or  infundibulum (series 3, image 57) series 350, image 4). 3 mm medially projecting vascular protrusion arising from the cavernous left internal carotid artery consistent with an aneurysm (series 3, image 64) series 351, image 9). Posterior circulation: The intracranial vertebral arteries are patent. The left vertebral artery is dominant. The basilar artery is patent. The posterior cerebral arteries are patent. Posterior communicating arteries are present bilaterally. Anatomic variants: As described. IMPRESSION: 1. No proximal intracranial large vessel occlusion or high-grade proximal arterial stenosis identified. 2. 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 2 mm vascular protrusion arising from the right internal carotid artery at the petrous-cavernous junction, which may reflect an aneurysm or infundibulum. 2 mm vascular protrusion  arising from the cavernous right internal carotid artery more distally, which may reflect an aneurysm or infundibulum. Neurointerventional consultation recommended. Electronically Signed   By: Jackey Loge D.O.   On: 02/23/2023 09:50   MR BRAIN WO CONTRAST Result Date: 02/23/2023 CLINICAL DATA:  Initial evaluation for acute neuro deficit, stroke suspected. EXAM: MRI HEAD WITHOUT CONTRAST TECHNIQUE: Multiplanar, multiecho pulse sequences of the brain and surrounding structures were obtained without intravenous contrast. COMPARISON:  CT from 05/19/2022 FINDINGS: Brain: Examination mildly degraded by motion artifact. Mild age-related cerebral atrophy. Patchy T2/FLAIR hyperintensity involving the periventricular deep white matter both cerebral hemispheres, consistent with chronic small vessel ischemic disease, mild for age. Few scattered superimposed remote lacunar infarcts present about the hemispheric cerebral white matter. Remote cortical subcortical infarct noted at the posterior right frontal region. Few scattered small remote bilateral cerebellar infarcts noted. Minimal  chronic hemosiderin staining noted about a few of these infarcts. Patchy restricted diffusion involving the parasagittal left frontal lobe, consistent with acute ischemic infarct (series 5, images 79, 81). Additional subcentimeter acute to subacute ischemic infarct noted at the contralateral subcortical right frontal lobe (series 5, image 80). Subcentimeter acute to early subacute infarct at the superior left thalamus (series 5, image 77). Mild associated petechial blood products at the left frontal infarct (series 12, image 42). No other associated hemorrhage. No other acute or chronic intracranial blood products. No mass lesion, midline shift or mass effect. No hydrocephalus or extra-axial fluid collection. Pituitary gland suprasellar region within normal limits. Vascular: Major intracranial vascular flow voids are maintained. Skull and upper cervical spine: Craniocervical junction within normal limits. Bone marrow signal intensity grossly normal. No scalp soft tissue abnormality. Sinuses/Orbits: Prior bilateral ocular lens replacement. 1 cm mildly complex cystic focus noted at the lateral margin of the right orbit, nonspecific, but likely benign and of doubtful significance. Paranasal sinuses are largely clear. Small left mastoid effusion, of doubtful significance. Negative nasopharynx. Other: None. IMPRESSION: 1. Patchy acute ischemic infarct involving the parasagittal left frontal lobe. Minimal associated petechial blood products without hemorrhagic transformation or significant mass effect. 2. Additional subcentimeter acute to early subacute ischemic nonhemorrhagic infarcts involving the subcortical right frontal lobe and superior left thalamus. 3. Underlying age-related cerebral atrophy with chronic microvascular ischemic disease, with a few scattered remote lacunar infarcts about the hemispheric cerebral white matter and cerebellum. Electronically Signed   By: Rise Mu M.D.   On: 02/23/2023 02:24       Signature  -   Susa Raring M.D on 02/24/2023 at 10:11 AM   -  To page go to [email or web address]

## 2023-02-24 NOTE — Plan of Care (Signed)
  Problem: Nutrition: Goal: Risk of aspiration will decrease Outcome: Progressing Goal: Dietary intake will improve Outcome: Progressing   Problem: Education: Goal: Knowledge of General Education information will improve Description: Including pain rating scale, medication(s)/side effects and non-pharmacologic comfort measures Outcome: Progressing   Problem: Activity: Goal: Risk for activity intolerance will decrease Outcome: Progressing   Problem: Nutrition: Goal: Adequate nutrition will be maintained Outcome: Progressing

## 2023-02-25 ENCOUNTER — Other Ambulatory Visit: Payer: Self-pay | Admitting: Cardiology

## 2023-02-25 DIAGNOSIS — I639 Cerebral infarction, unspecified: Secondary | ICD-10-CM | POA: Diagnosis not present

## 2023-02-25 NOTE — Progress Notes (Signed)
   30 day event monitor requested by [facility] for patient s/p CVA.   Perlie Gold, PA-C

## 2023-02-25 NOTE — Plan of Care (Signed)
Problem: Ischemic Stroke/TIA Tissue Perfusion: Goal: Complications of ischemic stroke/TIA will be minimized Outcome: Progressing   Problem: Coping: Goal: Will verbalize positive feelings about self Outcome: Progressing

## 2023-02-25 NOTE — Plan of Care (Signed)
  Problem: Ischemic Stroke/TIA Tissue Perfusion: Goal: Complications of ischemic stroke/TIA will be minimized Outcome: Progressing   Problem: Self-Care: Goal: Ability to participate in self-care as condition permits will improve Outcome: Progressing Goal: Verbalization of feelings and concerns over difficulty with self-care will improve Outcome: Progressing Goal: Ability to communicate needs accurately will improve Outcome: Progressing   Problem: Nutrition: Goal: Dietary intake will improve Outcome: Progressing   Problem: Activity: Goal: Risk for activity intolerance will decrease Outcome: Progressing

## 2023-02-25 NOTE — Progress Notes (Signed)
PROGRESS NOTE                                                                                                                                                                                                             Patient Demographics:    Lynn Ray, is a 89 y.o. female, DOB - 01-Aug-1934, NWG:[id number]  Outpatient Primary MD for the patient is Lynn Broker, MD    LOS - 2  Admit date - 02/22/2023    Chief Complaint  Patient presents with   Aphasia   Facial Droop       Brief Narrative (HPI from H&P)   89 y.o. female with history of hypertension, postoperative hypothyroidism, chronic kidney disease stage IV, chronic anemia, MGUS, prediabetes, rectal cancer was brought to the ER after patient was noticed to have difficulty expressing words.  As per patient's daughter who provided most of the history patient's son initially called yesterday morning at around 10 AM when patient talked normally.  At around 1 PM patient's son again called on the patient through the phone when patient's son felt that patient had some difficulty talking.  Later last evening when patient's daughter came to check on the patient patient was finding it difficult to express words.  Patient was brought to the ER for stroke workup.  There was also some concern for left facial droop.  The workup confirmed acute ischemic stroke in the right frontal and left thalamic area and she was admitted.   Subjective:    Lynn Ray today has, No headache, No chest pain, No abdominal pain - No Nausea, No new weakness tingling or numbness, no shortness of breath.  Speech much improved.   Assessment  & Plan :    Acute CVA -    appreciate neurology consult.  Stroke workup done, currently on DAPT for 3 weeks thereafter aspirin alone, LDL above goal hence added statin, PT OT and speech eval, may require CIR/rehab.  Stable echo and carotid duplex.  A1c stable, needs a  30-day event monitor upon discharge. Await CIR bed. Hypertension will allow for permissive hypertension hold hydrochlorothiazide.  Continue metoprolol. Postoperative hypothyroidism on Synthroid. Chronic kidney disease stage IV creatinine at around baseline. Chronic anemia likely from renal disease and also being followed by oncologist for MGUS.  Takes B12 supplement.  Check anemia panel. Dyslipidemia.  Placed on statin.        Condition -  Fair  Family Communication  : Daughter bedside 02/24/2023, 26 2025  Code Status : Full code  Consults  : Neurology  PUD Prophylaxis :    Procedures  :            Disposition Plan  :    Status is: Inpatient   DVT Prophylaxis  :    enoxaparin (LOVENOX) injection 30 mg Start: 02/23/23 1000    Lab Results  Component Value Date   PLT 222 02/24/2023    Diet :  Diet Order             Diet Heart Room service appropriate? Yes; Fluid consistency: Thin  Diet effective now                    Inpatient Medications  Scheduled Meds:  aspirin  300 mg Rectal Daily   Or   aspirin EC  325 mg Oral Daily   atorvastatin  40 mg Oral Daily   clopidogrel  75 mg Oral Daily   cyanocobalamin  1,000 mcg Oral Daily   enoxaparin (LOVENOX) injection  30 mg Subcutaneous Daily   levothyroxine  100 mcg Oral Daily   Continuous Infusions: PRN Meds:.acetaminophen **OR** acetaminophen (TYLENOL) oral liquid 160 mg/5 mL **OR** acetaminophen  Antibiotics  :    Anti-infectives (From admission, onward)    None         Objective:   Vitals:   02/24/23 1600 02/24/23 2106 02/25/23 0351 02/25/23 0458  BP: (!) 135/56 (!) 139/55 (!) 109/51   Pulse: 66     Resp: 17     Temp: 98 F (36.7 C) 97.7 F (36.5 C) 97.6 F (36.4 C)   TempSrc: Oral Oral Oral   SpO2: 99%     Weight:    83.4 kg  Height:        Wt Readings from Last 3 Encounters:  02/25/23 83.4 kg  10/17/22 79.4 kg  07/21/22 79.8 kg     Intake/Output Summary (Last 24 hours) at  02/25/2023 0842 Last data filed at 02/24/2023 1736 Gross per 24 hour  Intake 224 ml  Output --  Net 224 ml     Physical Exam  Awake Alert, No new F.N deficits, Normal affect [place].AT,PERRAL Supple Neck, No JVD,   Symmetrical Chest wall movement, Good air movement bilaterally, CTAB RRR,No Gallops,Rubs or new Murmurs,  +ve B.Sounds, Abd Soft, No tenderness,   No Cyanosis, Clubbing or edema        Data Review:    Recent Labs  Lab 02/22/23 2132 02/22/23 2136 02/23/23 0458 02/24/23 0514  WBC 7.3  --  6.8 6.0  HGB 10.5* 11.2* 9.9* 10.7*  HCT 33.8* 33.0* 31.5* 33.8*  PLT 234  --  220 222  MCV 90.9  --  89.7 88.3  MCH 28.2  --  28.2 27.9  MCHC 31.1  --  31.4 31.7  RDW 13.4  --  13.5 13.4  LYMPHSABS 2.0  --   --  2.0  MONOABS 0.4  --   --  0.6  EOSABS 0.3  --   --  0.4  BASOSABS 0.0  --   --  0.1    Recent Labs  Lab 02/22/23 2132 02/22/23 2136 02/23/23 0458 02/24/23 0514  NA 139 142  --  137  K 3.7 3.8  --  3.9  CL 109 111  --  109  CO2 22  --   --  21*  ANIONGAP 8  --   --  7  GLUCOSE 158* 154*  --  87  BUN 24* 25*  --  21  CREATININE 2.19* 2.30* 2.09* 1.83*  AST 17  --   --   --   ALT 10  --   --   --   ALKPHOS 86  --   --   --   BILITOT 0.4  --   --   --   ALBUMIN 3.5  --   --   --   INR 1.0  --   --   --   HGBA1C  --   --  5.8*  --   MG  --   --   --  2.1  CALCIUM 10.0  --   --  9.6      Recent Labs  Lab 02/22/23 2132 02/23/23 0458 02/24/23 0514  INR 1.0  --   --   HGBA1C  --  5.8*  --   MG  --   --  2.1  CALCIUM 10.0  --  9.6    --------------------------------------------------------------------------------------------------------------- Lab Results  Component Value Date   CHOL 156 02/23/2023   HDL 49 02/23/2023   LDLCALC 84 02/23/2023   LDLDIRECT 78.0 07/21/2022   TRIG 115 02/23/2023   CHOLHDL 3.2 02/23/2023    Lab Results  Component Value Date   HGBA1C 5.8 (H) 02/23/2023   No results for input(s): "TSH", "T4TOTAL", "FREET4",  "T3FREE", "THYROIDAB" in the last 72 hours. No results for input(s): "VITAMINB12", "FOLATE", "FERRITIN", "TIBC", "IRON", "RETICCTPCT" in the last 72 hours. ------------------------------------------------------------------------------------------------------------------ Cardiac Enzymes No results for input(s): "CKMB", "TROPONINI", "MYOGLOBIN" in the last 168 hours.  Invalid input(s): "CK"  Micro Results No results found for this or any previous visit (from the past 240 hours).  Radiology Reports ECHOCARDIOGRAM COMPLETE Result Date: 02/23/2023    ECHOCARDIOGRAM REPORT   Patient Name:   JASMIA ANGST Date of Exam: 02/23/2023 Medical Rec #:  [id number]    Height:       59.5 in Accession #:    [id number]   Weight:       175.0 lb Date of Birth:  18-Jun-1934    BSA:          1.753 m Patient Age:    88 years     BP:           107/89 mmHg Patient Gender: F            HR:           65 bpm. Exam Location:  Inpatient Procedure: 2D Echo, Cardiac Doppler and Color Doppler Indications:    Stroke  History:        Patient has no prior history of Echocardiogram examinations.                 COPD; Risk Factors:Hypertension.  Sonographer:    Amy Chionchio Referring Phys: [id number] ARSHAD N KAKRAKANDY IMPRESSIONS  1. Left ventricular ejection fraction, by estimation, is 55 to 60%. Left ventricular ejection fraction by PLAX is 59 %. The left ventricle has normal function. The left ventricle has no regional wall motion abnormalities. Left ventricular diastolic parameters are consistent with Grade I diastolic dysfunction (impaired relaxation).  2. Right ventricular systolic function is normal. The right ventricular size is normal. There is normal pulmonary artery systolic pressure.  3. The mitral valve is normal in structure. Mild mitral valve regurgitation. No evidence of mitral stenosis.  4. The  aortic valve is tricuspid. Aortic valve regurgitation is not visualized. No aortic stenosis is present.  5. The inferior vena cava is  normal in size with greater than 50% respiratory variability, suggesting right atrial pressure of 3 mmHg. FINDINGS  Left Ventricle: Left ventricular ejection fraction, by estimation, is 55 to 60%. Left ventricular ejection fraction by PLAX is 59 %. The left ventricle has normal function. The left ventricle has no regional wall motion abnormalities. The left ventricular internal cavity size was normal in size. There is no left ventricular hypertrophy. Left ventricular diastolic parameters are consistent with Grade I diastolic dysfunction (impaired relaxation). Normal left ventricular filling pressure. Right Ventricle: The right ventricular size is normal. No increase in right ventricular wall thickness. Right ventricular systolic function is normal. There is normal pulmonary artery systolic pressure. The tricuspid regurgitant velocity is 2.55 m/s, and  with an assumed right atrial pressure of 3 mmHg, the estimated right ventricular systolic pressure is 29.0 mmHg. Left Atrium: Left atrial size was normal in size. Right Atrium: Right atrial size was normal in size. Pericardium: There is no evidence of pericardial effusion. Mitral Valve: The mitral valve is normal in structure. Mild mitral valve regurgitation. No evidence of mitral valve stenosis. MV peak gradient, 3.8 mmHg. The mean mitral valve gradient is 1.0 mmHg. Tricuspid Valve: The tricuspid valve is normal in structure. Tricuspid valve regurgitation is mild . No evidence of tricuspid stenosis. Aortic Valve: The aortic valve is tricuspid. Aortic valve regurgitation is not visualized. No aortic stenosis is present. Aortic valve mean gradient measures 3.0 mmHg. Aortic valve peak gradient measures 4.5 mmHg. Aortic valve area, by VTI measures 3.08 cm. Pulmonic Valve: The pulmonic valve was normal in structure. Pulmonic valve regurgitation is trivial. No evidence of pulmonic stenosis. Aorta: The aortic root is normal in size and structure. Venous: The inferior vena  cava is normal in size with greater than 50% respiratory variability, suggesting right atrial pressure of 3 mmHg. IAS/Shunts: No atrial level shunt detected by color flow Doppler.  LEFT VENTRICLE PLAX 2D LV EF:         Left            Diastology                ventricular     LV e' medial:    7.40 cm/s                ejection        LV E/e' medial:  8.7                fraction by     LV e' lateral:   9.14 cm/s                PLAX is 59      LV E/e' lateral: 7.1                %. LVIDd:         3.90 cm LVIDs:         2.70 cm LV PW:         1.00 cm LV IVS:        0.90 cm LVOT diam:     2.10 cm LV SV:         70 LV SV Index:   40 LVOT Area:     3.46 cm  LV Volumes (MOD) LV vol d, MOD    60.9 ml A2C: LV vol d, MOD  76.1 ml A4C: LV vol s, MOD    23.7 ml A2C: LV vol s, MOD    38.2 ml A4C: LV SV MOD A2C:   37.2 ml LV SV MOD A4C:   76.1 ml LV SV MOD BP:    39.4 ml RIGHT VENTRICLE            IVC RV Basal diam:  2.80 cm    IVC diam: 1.50 cm RV S prime:     9.68 cm/s TAPSE (M-mode): 2.0 cm LEFT ATRIUM             Index        RIGHT ATRIUM           Index LA Vol (A2C):   36.2 ml 20.65 ml/m  RA Area:     10.40 cm LA Vol (A4C):   35.4 ml 20.20 ml/m  RA Volume:   21.00 ml  11.98 ml/m LA Biplane Vol: 38.6 ml 22.02 ml/m  AORTIC VALVE                    PULMONIC VALVE AV Area (Vmax):    3.01 cm     PV Vmax:          0.76 m/s AV Area (Vmean):   2.57 cm     PV Peak grad:     2.3 mmHg AV Area (VTI):     3.08 cm     PR End Diast Vel: 4.41 msec AV Vmax:           106.00 cm/s AV Vmean:          77.600 cm/s AV VTI:            0.227 m AV Peak Grad:      4.5 mmHg AV Mean Grad:      3.0 mmHg LVOT Vmax:         92.00 cm/s LVOT Vmean:        57.600 cm/s LVOT VTI:          0.202 m LVOT/AV VTI ratio: 0.89  AORTA Ao Root diam: 2.80 cm Ao Asc diam:  2.60 cm MITRAL VALVE               TRICUSPID VALVE MV Area (PHT): 3.28 cm    TR Peak grad:   26.0 mmHg MV Area VTI:   2.60 cm    TR Vmax:        255.00 cm/s MV Peak grad:  3.8 mmHg MV Mean  grad:  1.0 mmHg    SHUNTS MV Vmax:       0.97 m/s    Systemic VTI:  0.20 m MV Vmean:      49.5 cm/s   Systemic Diam: 2.10 cm MV E velocity: 64.70 cm/s MV A velocity: 78.40 cm/s MV E/A ratio:  0.83 Chilton Si MD Electronically signed by Chilton Si MD Signature Date/Time: 02/23/2023/5:54:41 PM    Final    VAS US CAROTID (at [facility] and [facility] only) Result Date: 02/23/2023 Carotid Arterial Duplex Study Patient Name:  CALIOPE RUPPERT  Date of Exam:   02/23/2023 Medical Rec #: [id number]     Accession #:    [id number] Date of Birth: 20-Jul-1934     Patient Gender: F Patient Age:   54 years Exam Location:  [facility] Procedure:      VAS US CAROTID Referring Phys: Midge Minium --------------------------------------------------------------------------------  Indications:   CVA and Speech disturbance. Risk Factors:  Hypertension,  hyperlipidemia, Diabetes. Other Factors: Hypothyroidism, CKD stage IV. Performing Technologist: Marilynne Halsted RDMS, RVT  Examination Guidelines: A complete evaluation includes B-mode imaging, spectral Doppler, color Doppler, and power Doppler as needed of all accessible portions of each vessel. Bilateral testing is considered an integral part of a complete examination. Limited examinations for reoccurring indications may be performed as noted.  Right Carotid Findings: +----------+--------+--------+--------+------------------+------------------+           PSV cm/sEDV cm/sStenosisPlaque DescriptionComments           +----------+--------+--------+--------+------------------+------------------+ CCA Prox  67      12                                                   +----------+--------+--------+--------+------------------+------------------+ CCA Distal66      15                                intimal thickening +----------+--------+--------+--------+------------------+------------------+ ICA Prox  79      17                                intimal thickening  +----------+--------+--------+--------+------------------+------------------+ ICA Distal77      21                                                   +----------+--------+--------+--------+------------------+------------------+ ECA       76      10                                                   +----------+--------+--------+--------+------------------+------------------+ +----------+--------+-------+----------------+-------------------+           PSV cm/sEDV cmsDescribe        Arm Pressure (mmHG) +----------+--------+-------+----------------+-------------------+ [id number]             Multiphasic, WNL                    +----------+--------+-------+----------------+-------------------+ +---------+--------+--+--------+--+---------+ VertebralPSV cm/s34EDV cm/s10Antegrade +---------+--------+--+--------+--+---------+  Left Carotid Findings: +----------+--------+--------+--------+------------------+------------------+           PSV cm/sEDV cm/sStenosisPlaque DescriptionComments           +----------+--------+--------+--------+------------------+------------------+ CCA Prox  72      17                                                   +----------+--------+--------+--------+------------------+------------------+ CCA Distal62      19                                intimal thickening +----------+--------+--------+--------+------------------+------------------+ ICA Prox  54      14                                                   +----------+--------+--------+--------+------------------+------------------+  ICA Mid   70      25                                                   +----------+--------+--------+--------+------------------+------------------+ ICA Distal111     27                                tortuous           +----------+--------+--------+--------+------------------+------------------+ ECA       60      9                                                     +----------+--------+--------+--------+------------------+------------------+ +----------+--------+--------+----------------+-------------------+           PSV cm/sEDV cm/sDescribe        Arm Pressure (mmHG) +----------+--------+--------+----------------+-------------------+ [id number]             Multiphasic, WNL                    +----------+--------+--------+----------------+-------------------+ +---------+--------+--+--------+--+ VertebralPSV cm/s68EDV cm/s10 +---------+--------+--+--------+--+   Summary: Right Carotid: The extracranial vessels were near-normal with only minimal wall                thickening or plaque. Left Carotid: The extracranial vessels were near-normal with only minimal wall               thickening or plaque. Vertebrals:  Bilateral vertebral arteries demonstrate antegrade flow. Subclavians: Normal flow hemodynamics were seen in bilateral subclavian              arteries. *See table(s) above for measurements and observations.  Electronically signed by Delia Heady MD on 02/23/2023 at 5:08:22 PM.    Final    MR ANGIO HEAD WO CONTRAST Result Date: 02/23/2023 CLINICAL DATA:  Provided history: Stroke, follow-up. EXAM: MRA HEAD WITHOUT CONTRAST TECHNIQUE: Angiographic images of the Circle of Willis were acquired using MRA technique without intravenous contrast. COMPARISON:  Brain MRI 02/23/2023. FINDINGS: Anterior circulation: The intracranial internal carotid arteries are patent. The M1 middle cerebral arteries are patent. No M2 proximal branch occlusion or high-grade proximal stenosis. The anterior cerebral arteries are patent. 2 mm posteriorly projecting vascular protrusion arising from the right ICA at the petrous-cavernous junction, which may reflect an aneurysm or infundibulum (series 350, image 4). 2 mm laterally projecting vascular protrusion arising from the cavernous right internal carotid artery, which may reflect an aneurysm or  infundibulum (series 3, image 57) series 350, image 4). 3 mm medially projecting vascular protrusion arising from the cavernous left internal carotid artery consistent with an aneurysm (series 3, image 64) series 351, image 9). Posterior circulation: The intracranial vertebral arteries are patent. The left vertebral artery is dominant. The basilar artery is patent. The posterior cerebral arteries are patent. Posterior communicating arteries are present bilaterally. Anatomic variants: As described. IMPRESSION: 1. No proximal intracranial large vessel occlusion or high-grade proximal arterial stenosis identified. 2. 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 2 mm vascular protrusion arising from the right internal carotid artery at the petrous-cavernous junction, which may reflect an aneurysm or infundibulum. 2 mm vascular protrusion  arising from the cavernous right internal carotid artery more distally, which may reflect an aneurysm or infundibulum. Neurointerventional consultation recommended. Electronically Signed   By: Jackey Loge D.O.   On: 02/23/2023 09:50   MR BRAIN WO CONTRAST Result Date: 02/23/2023 CLINICAL DATA:  Initial evaluation for acute neuro deficit, stroke suspected. EXAM: MRI HEAD WITHOUT CONTRAST TECHNIQUE: Multiplanar, multiecho pulse sequences of the brain and surrounding structures were obtained without intravenous contrast. COMPARISON:  CT from 05/19/2022 FINDINGS: Brain: Examination mildly degraded by motion artifact. Mild age-related cerebral atrophy. Patchy T2/FLAIR hyperintensity involving the periventricular deep white matter both cerebral hemispheres, consistent with chronic small vessel ischemic disease, mild for age. Few scattered superimposed remote lacunar infarcts present about the hemispheric cerebral white matter. Remote cortical subcortical infarct noted at the posterior right frontal region. Few scattered small remote bilateral cerebellar infarcts noted. Minimal  chronic hemosiderin staining noted about a few of these infarcts. Patchy restricted diffusion involving the parasagittal left frontal lobe, consistent with acute ischemic infarct (series 5, images 79, 81). Additional subcentimeter acute to subacute ischemic infarct noted at the contralateral subcortical right frontal lobe (series 5, image 80). Subcentimeter acute to early subacute infarct at the superior left thalamus (series 5, image 77). Mild associated petechial blood products at the left frontal infarct (series 12, image 42). No other associated hemorrhage. No other acute or chronic intracranial blood products. No mass lesion, midline shift or mass effect. No hydrocephalus or extra-axial fluid collection. Pituitary gland suprasellar region within normal limits. Vascular: Major intracranial vascular flow voids are maintained. Skull and upper cervical spine: Craniocervical junction within normal limits. Bone marrow signal intensity grossly normal. No scalp soft tissue abnormality. Sinuses/Orbits: Prior bilateral ocular lens replacement. 1 cm mildly complex cystic focus noted at the lateral margin of the right orbit, nonspecific, but likely benign and of doubtful significance. Paranasal sinuses are largely clear. Small left mastoid effusion, of doubtful significance. Negative nasopharynx. Other: None. IMPRESSION: 1. Patchy acute ischemic infarct involving the parasagittal left frontal lobe. Minimal associated petechial blood products without hemorrhagic transformation or significant mass effect. 2. Additional subcentimeter acute to early subacute ischemic nonhemorrhagic infarcts involving the subcortical right frontal lobe and superior left thalamus. 3. Underlying age-related cerebral atrophy with chronic microvascular ischemic disease, with a few scattered remote lacunar infarcts about the hemispheric cerebral white matter and cerebellum. Electronically Signed   By: Rise Mu M.D.   On: 02/23/2023 02:24       Signature  -   Susa Raring M.D on 02/25/2023 at 8:42 AM   -  To page go to [email or web address]

## 2023-02-26 DIAGNOSIS — I639 Cerebral infarction, unspecified: Secondary | ICD-10-CM | POA: Diagnosis not present

## 2023-02-26 DIAGNOSIS — I69391 Dysphagia following cerebral infarction: Secondary | ICD-10-CM | POA: Diagnosis not present

## 2023-02-26 NOTE — Consult Note (Signed)
Physical Medicine and Rehabilitation Consult Reason for Consult:impaired functional mobility and language Referring Physician: Thedore Mins   HPI: Lynn Ray is a 89 y.o. female with a history of hypertension, stage IV chronic kidney disease, rectal cancer who presented on 02/22/2023 with word finding deficits over the course of the day.  She had some left-sided weakness and facial droop upon presentation.  MRI the brain revealed an acute infarct in the right frontal and left thalamic areas.  Neurology saw the patient and recommended aspirin and Plavix for 3 weeks and then just aspirin alone.  30-day event monitor was recommended upon discharge.  Permissive hypertension allowed given acute stroke.  Patient with anemia felt to be chronic due to her kidney disease.  Patient has been up with therapy and thus far has required min assist to contact-guard assist for sit to stand transfers and has walked 20 feet using 1 person hand-held assistance.  Wide base of support is noted.  She was seen by Occupational Therapy yesterday and was contact-guard assist for transfers and contact-guard to min assist with basic ADL's patient lives at home alone in a multilevel home with 3 steps to enter she has family you are available to assist intermittently   Review of Systems  Constitutional: Negative.   HENT: Negative.    Eyes: Negative.   Respiratory: Negative.    Cardiovascular: Negative.   Gastrointestinal: Negative.   Genitourinary: Negative.   Musculoskeletal: Negative.   Skin: Negative.   Neurological:  Positive for focal weakness.  Psychiatric/Behavioral:  Negative for depression.    Past Medical History:  Diagnosis Date   Arthritis    Cancer [facility]) 2004   rectal cancer   Cataract    right eye   Chiari malformation    CKD (chronic kidney disease), stage IV (HCC)    Depression    Diabetes mellitus (HCC)    "pre"   Hypertension    Sleep apnea    no CPAP   Thyroid disease    hyporthyroid    Past Surgical History:  Procedure Laterality Date   CATARACT EXTRACTION Bilateral    chiari malformation  2013   surgery   COLONOSCOPY     FLEXIBLE SIGMOIDOSCOPY     POLYPECTOMY     Family History  Problem Relation Age of Onset   Alzheimer's disease Mother    Stroke Mother        mini   Heart attack Mother    Hypertension Mother    Depression Other    Anemia Other    Obesity Other    Colon cancer Neg Hx    Esophageal cancer Neg Hx    Stomach cancer Neg Hx    Social History:  reports that she has quit smoking. She has never used smokeless tobacco. She reports that she does not drink alcohol and does not use drugs. Allergies:  Allergies  Allergen Reactions   Aspirin Other (See Comments)    Severe abdominal pain   Tape Rash    adhesive   Medications Prior to Admission  Medication Sig Dispense Refill   Carboxymethylcellulose Sodium ([employer] LUBRICANT EYE DROPS OP) Apply 2 drops to eye 2 (two) times daily as needed.     cholecalciferol (VITAMIN D3) 25 MCG (1000 UT) tablet Take 1,000 Units by mouth daily.     hydrochlorothiazide (HYDRODIURIL) 12.5 MG tablet Take 12.5 mg by mouth daily.     levothyroxine (SYNTHROID) 100 MCG tablet TAKE 1 TABLET (100 MCG TOTAL) BY  MOUTH DAILY. FOLLOW-UP DUE IN JUNE WITH LABS 90 tablet 0   metoprolol succinate (TOPROL-XL) 50 MG 24 hr tablet TAKE 1 TABLET BY MOUTH EVERY DAY 90 tablet 3   vitamin B-12 (CYANOCOBALAMIN) 100 MCG tablet Take 100 mcg by mouth daily.     Tiotropium Bromide Monohydrate (SPIRIVA RESPIMAT) 2.5 MCG/ACT AERS Inhale 2 puffs into the lungs daily. (Patient not taking: Reported on 02/23/2023) 4 g 0    Home: Home Living Family/patient expects to be discharged to:: Private residence Living Arrangements: Alone Available Help at Discharge: Family, Available PRN/intermittently Type of Home: House Home Access: Stairs to enter Secretary/administrator of Steps: 3 Entrance Stairs-Rails: Right Home Layout: Multi-level Alternate Level  Stairs-Number of Steps: 6 (main level to bedroom) Alternate Level Stairs-Rails: Right (going up) Bathroom Shower/Tub: Tub/shower unit, Engineer, building services: Pharmacist, community:  (unsure) Home Equipment: Shower seat, Medical laboratory scientific officer - single point  Lives With: Alone (has assist over night)  Functional History: Prior Function Prior Level of Function : Independent/Modified Independent, Driving Mobility Comments: was driving ADLs Comments: cooking and cleaning support as needed but will cook for family Functional Status:  Mobility: Bed Mobility Overal bed mobility: Needs Assistance Bed Mobility: Supine to Sit Supine to sit: Supervision Sit to supine: Contact guard assist, HOB elevated General bed mobility comments: Pt presented in chair Transfers Overall transfer level: Needs assistance Equipment used: Rolling walker (2 wheels) Transfers: Sit to/from Stand Sit to Stand: Supervision, Contact guard assist General transfer comment: close CGA for safety with rise, HHA provided with min assist for steadying balance on standing. Ambulation/Gait Ambulation/Gait assistance: Min assist Gait Distance (Feet): 20 Feet Assistive device: 1 person hand held assist Gait Pattern/deviations: Step-through pattern, Decreased stride length, Decreased dorsiflexion - right, Decreased dorsiflexion - left, Shuffle, Wide base of support General Gait Details: HHA required to steady balance, cues for direction and pt getting distracted by external stimuli in hallway. Gait velocity: decr    ADL: ADL Overall ADL's : Needs assistance/impaired Eating/Feeding: Independent, Sitting Grooming: Wash/dry hands, Wash/dry face, Oral care, Contact guard assist, Standing Upper Body Bathing: Contact guard assist, Sitting Lower Body Bathing: Minimal assistance, Sit to/from stand Upper Body Dressing : Contact guard assist, Sitting Lower Body Dressing: Minimal assistance, Sit to/from stand Toilet Transfer: Contact  guard assist, Rolling walker (2 wheels) Toileting- Clothing Manipulation and Hygiene: Contact guard assist, Sit to/from stand Functional mobility during ADLs: Minimal assistance, Rolling walker (2 wheels)  Cognition: Cognition Overall Cognitive Status: Impaired/Different from baseline Arousal/Alertness: Awake/alert Orientation Level: Oriented X4 Cognition Arousal: Alert Behavior During Therapy: WFL for tasks assessed/performed Overall Cognitive Status: Impaired/Different from baseline Area of Impairment: Problem solving Current Attention Level: Sustained Memory: Decreased short-term memory, Decreased recall of precautions Following Commands: Follows one step commands consistently Safety/Judgement: Decreased awareness of safety, Decreased awareness of deficits Awareness: Intellectual Problem Solving: Slow processing, Decreased initiation, Difficulty sequencing, Requires verbal cues, Requires tactile cues General Comments: Per family reported to have significant increase in ability to complete tasks  Blood pressure (!) 145/34, pulse 68, temperature 98.2 F (36.8 C), resp. rate 18, height 4' 11.5" (1.511 m), weight 83.3 kg, SpO2 97%. Physical Exam Constitutional:      General: She is not in acute distress.    Appearance: She is not ill-appearing.  HENT:     Head: Normocephalic.     Right Ear: There is no impacted cerumen.     Left Ear: There is no impacted cerumen.     Nose: Nose normal.  Eyes:  Pupils: Pupils are equal, round, and reactive to light.  Cardiovascular:     Rate and Rhythm: Normal rate.  Pulmonary:     Effort: Pulmonary effort is normal.  Abdominal:     Palpations: Abdomen is soft.  Musculoskeletal:        General: No swelling or tenderness. Normal range of motion.     Cervical back: Normal range of motion.  Skin:    General: Skin is warm and dry.  Neurological:     Mental Status: She is alert.     Comments: Alert and oriented x 3. Normal insight and  awareness. Intact Memory. Non-fluent language but comprehension intact. Speaks in short phrases and words. Clear speech. Cranial nerve exam unremarkable. I didn't appreciate any facial asymmetry.  MMT: RUE 4/5 prox to distal. LUE 4- to 4/5. RLE 4/5 prox to idstal. LLE 4- to 4/5 prox to distal. No focal sensory loss. Decreased FMC with FTN, HTS. Did not stand her. Seems to appreciate LT and pain in all 4's. No abnl resting tone. DTR's 1+      No results found for this or any previous visit (from the past 24 hours). No results found.  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 Does the need for close, 24 hr/day medical supervision in concert with the patient's rehab needs make it unreasonable for this patient to be served in a less intensive setting? Yes Co-Morbidities requiring supervision/potential complications:  -Dysphagia  -hypertension -Chronic kidney disease stage IV -Chronic anemia -MGUS Due to bladder management, bowel management, safety, skin/wound care, disease management, medication administration, pain management, and patient education, does the patient require 24 hr/day rehab nursing? Yes Does the patient require coordinated care of a physician, rehab nurse, therapy disciplines of PT, OT, SLP to address physical and functional deficits in the context of the above medical diagnosis(es)? Yes Addressing deficits in the following areas: balance, endurance, locomotion, strength, transferring, bowel/bladder control, bathing, dressing, feeding, grooming, toileting, cognition, language, swallowing, and psychosocial support Can the patient actively participate in an intensive therapy program of at least 3 hrs of therapy per day at least 5 days per week? Yes The potential for patient to make measurable gains while on inpatient rehab is excellent Anticipated functional outcomes upon discharge from  inpatient rehab are modified independent  with PT, modified independent with OT, modified independent and supervision with SLP. Estimated rehab length of stay to reach the above functional goals is: 7-10 days Anticipated discharge destination: Home Overall Rehab/Functional Prognosis: excellent  POST ACUTE RECOMMENDATIONS: This patient's condition is appropriate for continued rehabilitative care in the following setting: CIR Patient has agreed to participate in recommended program. Yes Note that insurance prior authorization may be required for reimbursement for recommended care.  Comment: Pt lives in a split level home. Dwells on upper two levels. Pt is motivated to regain functional mobility. Good family supports. Rehab Admissions Coordinator to follow up.      I have personally performed a face to face diagnostic evaluation of this patient. Additionally, I have examined the patient's medical record including any pertinent labs and radiographic images. If the physician assistant has documented in this note, I have reviewed and edited or otherwise concur with the physician assistant's documentation.  Thanks,  [name], MD 02/26/2023

## 2023-02-26 NOTE — Progress Notes (Signed)
Follow up visit with patient and daughter, patient believes she is better and able to go hope. Provided spiritual care and prayer as well as reflective listening.  Patient will need physical therapy and hope it will be here at the hospital before she leaves, if not she will do it at home but prefers that the arrangements ar made before she leaves the hospital.

## 2023-02-26 NOTE — PMR Pre-admission (Signed)
PMR Admission Coordinator Pre-Admission Assessment  Patient: Lynn Ray is an 89 y.o., female MRN: [id number] DOB: 06/25/1934 Height: 4' 11.5" (151.1 cm) Weight: 83.3 kg              Insurance Information HMO:     PPO: yes     PCP:      IPA:      80/20:      OTHER:  PRIMARY: [employer]      Policy#: [id number]       Subscriber: pt CM Name: ***      Phone#: [phone] ***     Fax#: [phone] Pre-Cert#: ***      Employer:  Benefits:  Phone #: [phone]     Name:  Dolores Hoose. Date: ***     Deduct: ***      Out of Pocket Max: ***      Life Max: ***  CIR: ***      SNF: *** Outpatient: ***     Co-Pay: *** Home Health: ***      Co-Pay: *** DME: ***     Co-Pay: *** Providers:  SECONDARY:       Policy#:       Phone#:   Financial Counselor:       Phone#:   The "Data Collection Information Summary" for patients in Inpatient Rehabilitation Facilities with attached "Privacy Act Statement-Health Care Records" was provided and verbally reviewed with: Patient and Family  Emergency Contact Information Contact Information     Name Relation Home Work Mobile   [place] Daughter [phone]     [facility] Niece   [phone]      Other Contacts   None on File    Current Medical History  Patient Admitting Diagnosis: CVA   History of Present Illness: Pt is an 89 y/o female with PMH of HTN, hypothyroidism, CKD IV, chronic anemia, MGUS, and rectal cancer, admitted to [facility] on 02/22/23 with c/o aphasia and facial droop.  In ED vitals and labs reassuring.  MRI completed which showed acute stroke in the right frontal and left thalamic area.  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  Doppler with no significant carotid stenosis.  Stroke team recommended a 30-day event monitor.  Therapy evaluations completed and pt was recommended for CIR.   Complete NIHSS TOTAL: 1 Glasgow Coma Scale Score: 15  Patient's medical record from [facility] has been reviewed by the rehabilitation  admission coordinator and physician.  Past Medical History  Past Medical History:  Diagnosis Date   Arthritis    Cancer (HCC) 2004   rectal cancer   Cataract    right eye   Chiari malformation    CKD (chronic kidney disease), stage IV (HCC)    Depression    Diabetes mellitus (HCC)    "pre"   Hypertension    Sleep apnea    no CPAP   Thyroid disease    hyporthyroid    Has the patient had major surgery during 100 days prior to admission? No  Family History  family history includes Alzheimer's disease in her mother; Anemia in an other family member; Depression in an other family member; Heart attack in her mother; Hypertension in her mother; Obesity in an other family member; Stroke in her mother.   Current Medications   Current Facility-Administered Medications:    acetaminophen (TYLENOL) tablet 650 mg, 650 mg, Oral, Q4H PRN **OR** acetaminophen (TYLENOL) 160 MG/5ML solution 650 mg, 650 mg, Per Tube, Q4H PRN **OR**  acetaminophen (TYLENOL) suppository 650 mg, 650 mg, Rectal, Q4H PRN, Eduard Clos, MD   aspirin suppository 300 mg, 300 mg, Rectal, Daily **OR** aspirin EC tablet 325 mg, 325 mg, Oral, Daily, Eduard Clos, MD, 325 mg at 02/26/23 0934   atorvastatin (LIPITOR) tablet 40 mg, 40 mg, Oral, Daily, Gevena Mart A, NP, 40 mg at 02/26/23 1610   clopidogrel (PLAVIX) tablet 75 mg, 75 mg, Oral, Daily, Gevena Mart A, NP, 75 mg at 02/26/23 9604   cyanocobalamin (VITAMIN B12) tablet 1,000 mcg, 1,000 mcg, Oral, Daily, Eduard Clos, MD, 1,000 mcg at 02/26/23 0934   enoxaparin (LOVENOX) injection 30 mg, 30 mg, Subcutaneous, Daily, Eduard Clos, MD, 30 mg at 02/26/23 5409   levothyroxine (SYNTHROID) tablet 100 mcg, 100 mcg, Oral, Daily, Eduard Clos, MD, 100 mcg at 02/26/23 0534  Patients Current Diet:  Diet Order             Diet Heart Room service appropriate? Yes; Fluid consistency: Thin  Diet effective now                    Precautions / Restrictions Precautions Precautions: Fall Precaution Comments: watch bp Restrictions Weight Bearing Restrictions Per Provider Order: No   Has the patient had 2 or more falls or a fall with injury in the past year?No  Prior Activity Level Community (5-7x/wk): indep prior to admit without device, was driving to her appointments and managing meds/cooking at baseline  Prior Functional Level Prior Function Prior Level of Function : Independent/Modified Independent, Driving Mobility Comments: was driving ADLs Comments: cooking and cleaning support as needed but will cook for family  Self Care: Did the patient need help bathing, dressing, using the toilet or eating?  Independent  Indoor Mobility: Did the patient need assistance with walking from room to room (with or without device)? Independent  Stairs: Did the patient need assistance with internal or external stairs (with or without device)? Independent  Functional Cognition: Did the patient need help planning regular tasks such as shopping or remembering to take medications? Independent  Patient Information Are you of Hispanic, Latino/a,or Spanish origin?: A. No, not of Hispanic, Latino/a, or Spanish origin What is your race?: B. Black or African American Do you need or want an interpreter to communicate with a doctor or health care staff?: 0. No  Patient's Response To:  Health Literacy and Transportation Is the patient able to respond to health literacy and transportation needs?: Yes Health Literacy - How often do you need to have someone help you when you read instructions, pamphlets, or other written material from your doctor or pharmacy?: Never In the past 12 months, has lack of transportation kept you from medical appointments or from getting medications?: No In the past 12 months, has lack of transportation kept you from meetings, work, or from getting things needed for daily living?: No  Event organiser / Equipment Home Equipment: Shower seat, Medical laboratory scientific officer - single point  Prior Device Use: Indicate devices/aids used by the patient prior to current illness, exacerbation or injury? None of the above  Current Functional Level Cognition  Arousal/Alertness: Awake/alert Overall Cognitive Status: Impaired/Different from baseline Current Attention Level: Sustained Orientation Level: Oriented X4 Following Commands: Follows one step commands consistently Safety/Judgement: Decreased awareness of safety, Decreased awareness of deficits General Comments: Per family reported to have significant increase in ability to complete tasks    Extremity Assessment (includes Sensation/Coordination)  Upper Extremity Assessment: Generalized weakness, RUE deficits/detail RUE  Deficits / Details: decrease in FM coordination and strength in RUE RUE Sensation: WNL RUE Coordination: decreased gross motor  Lower Extremity Assessment: Defer to PT evaluation    ADLs  Overall ADL's : Needs assistance/impaired Eating/Feeding: Independent, Sitting Grooming: Wash/dry hands, Wash/dry face, Oral care, Contact guard assist, Standing Upper Body Bathing: Contact guard assist, Sitting Lower Body Bathing: Minimal assistance, Sit to/from stand Upper Body Dressing : Contact guard assist, Sitting Lower Body Dressing: Minimal assistance, Sit to/from stand Toilet Transfer: Contact guard assist, Rolling walker (2 wheels) Toileting- Clothing Manipulation and Hygiene: Contact guard assist, Sit to/from stand Functional mobility during ADLs: Minimal assistance, Rolling walker (2 wheels)    Mobility  Overal bed mobility: Needs Assistance Bed Mobility: Supine to Sit Supine to sit: Supervision Sit to supine: Contact guard assist, HOB elevated General bed mobility comments: Pt presented in chair    Transfers  Overall transfer level: Needs assistance Equipment used: Rolling walker (2 wheels) Transfers: Sit to/from Stand Sit to  Stand: Supervision, Contact guard assist General transfer comment: close CGA for safety with rise, HHA provided with min assist for steadying balance on standing.    Ambulation / Gait / Stairs / Wheelchair Mobility  Ambulation/Gait Ambulation/Gait assistance: Editor, commissioning (Feet): 20 Feet Assistive device: 1 person hand held assist Gait Pattern/deviations: Step-through pattern, Decreased stride length, Decreased dorsiflexion - right, Decreased dorsiflexion - left, Shuffle, Wide base of support General Gait Details: HHA required to steady balance, cues for direction and pt getting distracted by external stimuli in hallway. Gait velocity: decr    Posture / Balance Dynamic Sitting Balance Sitting balance - Comments: attempted far anterior lean to don socks, able to don with min assist and extra effort Balance Overall balance assessment: Needs assistance Sitting-balance support: Feet supported Sitting balance-Leahy Scale: Good Sitting balance - Comments: attempted far anterior lean to don socks, able to don with min assist and extra effort Standing balance support: Bilateral upper extremity supported Standing balance-Leahy Scale: Fair Standing balance comment: Pt noted to have decrease in strength in BLE while standing and required to sit prior to more activity    Special needs/care consideration N/a     Previous Home Environment (from acute therapy documentation) Living Arrangements: Alone  Lives With: Alone (has assist over night) Available Help at Discharge: Family, Available PRN/intermittently Type of Home: House Home Layout: Multi-level Alternate Level Stairs-Rails: Right (going up) Alternate Level Stairs-Number of Steps: 6 (main level to bedroom) Home Access: Stairs to enter Entrance Stairs-Rails: Right Entrance Stairs-Number of Steps: 3 Bathroom Shower/Tub: Tub/shower unit, Buyer, retail:  (unsure)  Discharge Living  Setting Plans for Discharge Living Setting: Patient's home Type of Home at Discharge: House Discharge Home Layout: Multi-level Alternate Level Stairs-Rails: Right Alternate Level Stairs-Number of Steps: 6 (to bed/bath) Discharge Home Access: Stairs to enter Entrance Stairs-Rails: Right Entrance Stairs-Number of Steps: 3 through garage Discharge Bathroom Shower/Tub: Tub/shower unit Discharge Bathroom Toilet: Standard Discharge Bathroom Accessibility: Yes How Accessible: Accessible via walker Does the patient have any problems obtaining your medications?: No  Social/Family/Support Systems Anticipated Caregiver: daughter Gunnar Fusi is primary contact Anticipated Caregiver's Contact Information: [phone] Ability/Limitations of Caregiver: supervision Caregiver Availability: 24/7 Discharge Plan Discussed with Primary Caregiver: Yes Is Caregiver In Agreement with Plan?: Yes Does Caregiver/Family have Issues with Lodging/Transportation while Pt is in Rehab?: No   Goals Patient/Family Goal for Rehab: PT/OT mod I, SLP supervision to mod I Expected length of stay: 7-10 days Additional Information: Discharge plan: return to patient's  home.  Caregiver overnight, daughter coordinating inital 24/7 supervision Pt/Family Agrees to Admission and willing to participate: Yes Program Orientation Provided & Reviewed with Pt/Caregiver Including Roles  & Responsibilities: Yes   Decrease burden of Care through IP rehab admission: No   Possible need for SNF placement upon discharge: Not anticipated.  Plan to discharge to pt's home with daughter and hired caregivers.  Expect intermittent mod I level at discharge.     Patient Condition: This patient's condition remains as documented in the consult dated 02/26/23, in which the Rehabilitation Physician determined and documented that the patient's condition is appropriate for intensive rehabilitative care in an inpatient rehabilitation facility. Will admit to  inpatient rehab pending insurance approval ***.  Preadmission Screen Completed By:  Stephania Fragmin, PT, 02/26/2023 1:16 PM ______________________________________________________________________   Discussed status with Dr. Marland Kitchenon***at *** and received approval for admission today.  Admission Coordinator:  Stephania Fragmin, time***/Date***

## 2023-02-26 NOTE — Progress Notes (Signed)
Physical Therapy Treatment Patient Details Name: Lynn Ray MRN: [id number] DOB: 08-04-1934 Today's Date: 02/26/2023   History of Present Illness 89 yo female demonstrates expressive deficits and L facial droop. 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    PT Comments  Pt received in the recliner and agreeable to session. Pt able to tolerate increased gait distance without AD this session, however demonstrates increased instability without UE support. Pt demonstrates one LOB requiring assist to correct. Pt becomes distracted in the hallway easily requiring redirection and CGA for safety. Pt able to complete 5 STS without UE support with no LOB, however pt demonstrates DOE and increased fatigue. Pt continues to benefit from PT services to progress toward functional mobility goals.    If plan is discharge home, recommend the following: A little help with walking and/or transfers;A little help with bathing/dressing/bathroom;Assistance with cooking/housework;Direct supervision/assist for medications management;Assist for transportation;Help with stairs or ramp for entrance;Supervision due to cognitive status   Can travel by private vehicle        Equipment Recommendations   (defer to next venue)    Recommendations for Other Services       Precautions / Restrictions Precautions Precautions: Fall Precaution Comments: watch bp Restrictions Weight Bearing Restrictions Per Provider Order: No     Mobility  Bed Mobility               General bed mobility comments: Pt sitting in recliner at the beginning and end of session    Transfers Overall transfer level: Needs assistance Equipment used: Rolling walker (2 wheels), None Transfers: Sit to/from Stand Sit to Stand: Contact guard assist           General transfer comment: From recliner with and without UE support with CGA for safety     Ambulation/Gait Ambulation/Gait assistance: Contact guard assist, Min assist Gait Distance (Feet): 180 Feet Assistive device: Rolling walker (2 wheels), None Gait Pattern/deviations: Step-through pattern, Decreased stride length, Shuffle, Wide base of support, Drifts right/left       General Gait Details: Pt initially using RW with supervision progressing to no AD with CGA for safety. 1 LOB requiring min A to correct. Pt demonstrates increased instability and drifting with head turns and distractions in hallway      Balance Overall balance assessment: Needs assistance Sitting-balance support: Feet supported Sitting balance-Leahy Scale: Good     Standing balance support: Bilateral upper extremity supported, No upper extremity supported, During functional activity Standing balance-Leahy Scale: Fair Standing balance comment: with and without AD. 1 mild LOB without AD requiring min A to correct                            Cognition Arousal: Alert Behavior During Therapy: WFL for tasks assessed/performed Overall Cognitive Status: Impaired/Different from baseline                                 General Comments: Pt becomes distracted requiring redirection to task        Exercises Other Exercises Other Exercises: x5 serial STS without UE support    General Comments        Pertinent Vitals/Pain Pain Assessment Pain Assessment: No/denies pain     PT Goals (current goals can now be found in the care plan section) Acute Rehab PT Goals  Patient Stated Goal: regain independence PT Goal Formulation: With patient Time For Goal Achievement: 03/09/23 Progress towards PT goals: Progressing toward goals    Frequency    Min 1X/week       AM-PAC PT "6 Clicks" Mobility   Outcome Measure  Help needed turning from your back to your side while in a flat bed without using bedrails?: A Little Help needed moving from lying on your back to sitting on  the side of a flat bed without using bedrails?: A Little Help needed moving to and from a bed to a chair (including a wheelchair)?: A Little Help needed standing up from a chair using your arms (e.g., wheelchair or bedside chair)?: A Little Help needed to walk in hospital room?: A Little Help needed climbing 3-5 steps with a railing? : A Lot 6 Click Score: 17    End of Session Equipment Utilized During Treatment: Gait belt Activity Tolerance: Patient tolerated treatment well Patient left: in chair;with family/visitor present;with call bell/phone within reach Nurse Communication: Mobility status PT Visit Diagnosis: Muscle weakness (generalized) (M62.81);Difficulty in walking, not elsewhere classified (R26.2);Other abnormalities of gait and mobility (R26.89);Other symptoms and signs involving the nervous system (R29.898);Unsteadiness on feet (R26.81)     Time: 1322-1340 PT Time Calculation (min) (ACUTE ONLY): 18 min  Charges:    $Gait Training: 8-22 mins PT General Charges $$ ACUTE PT VISIT: 1 Visit                     Johny Shock, PTA Acute Rehabilitation Services Secure Chat Preferred  [phone]    Johny Shock 02/26/2023, 2:31 PM

## 2023-02-26 NOTE — Progress Notes (Signed)
Inpatient Rehab Coordinator Note:  I spoke with patient's daughter, Gunnar Fusi, over the phone to discuss CIR recommendations and goals/expectations of CIR stay.  We reviewed 3 hrs/day of therapy, physician follow up, and estimated length of stay 7-10 days (dependent upon progress) with goals of supervision to mod I.  Pt has someone stay with her overnight at baseline, and Paula stays with her Thursday afternoon through Saturday evenings.  We discussed likely recommendation for initial 24/7 supervision at discharge and she can work on this.  We reviewed insurance auth process and I will start that today.    Estill Dooms, PT, DPT Admissions Coordinator [phone] 02/26/23  1:11 PM

## 2023-02-26 NOTE — Progress Notes (Signed)
PROGRESS NOTE                                                                                                                                                                                                             Patient Demographics:    Lynn Ray, is a 88 y.o. female, DOB - 06/04/1934, ZOX:[id number]  Outpatient Primary MD for the patient is [name], MD    LOS - 3  Admit date - 02/22/2023    Chief Complaint  Patient presents with   Aphasia   Facial Droop       Brief Narrative (HPI from H&P)   89 y.o. female with history of hypertension, postoperative hypothyroidism, chronic kidney disease stage IV, chronic anemia, MGUS, prediabetes, rectal cancer was brought to the ER after patient was noticed to have difficulty expressing words.  As per patient's daughter who provided most of the history patient's son initially called yesterday morning at around 10 AM when patient talked normally.  At around 1 PM patient's son again called on the patient through the phone when patient's son felt that patient had some difficulty talking.  Later last evening when patient's daughter came to check on the patient patient was finding it difficult to express words.  Patient was brought to the ER for stroke workup.  There was also some concern for left facial droop.  The workup confirmed acute ischemic stroke in the right frontal and left thalamic area and she was admitted.   Subjective:   Patient in bed, appears comfortable, denies any headache, no fever, no chest pain or pressure, no shortness of breath , no abdominal pain. No new focal weakness.    Assessment  & Plan :    Acute CVA -    appreciate neurology consult.  Stroke workup done, currently on DAPT for 3 weeks thereafter aspirin alone, LDL above goal hence added statin, PT OT and speech eval, may require CIR/rehab.  Stable echo and carotid duplex.  A1c stable, needs a  30-day event monitor upon discharge. Await CIR bed. Hypertension will allow for permissive hypertension hold hydrochlorothiazide.  Continue metoprolol. Postoperative hypothyroidism on Synthroid. Chronic kidney disease stage IV creatinine at around baseline. Chronic anemia likely from renal disease and also being followed by oncologist for MGUS.  Takes B12 supplement.  Check anemia panel. Dyslipidemia.  Placed  on statin.        Condition -  Fair  Family Communication  : Daughter bedside 02/24/2023, 02/25/2023, 02/26/2023  Code Status : Full code  Consults  : Neurology  PUD Prophylaxis :    Procedures  :            Disposition Plan  :    Status is: Inpatient   DVT Prophylaxis  :    enoxaparin (LOVENOX) injection 30 mg Start: 02/23/23 1000    Lab Results  Component Value Date   PLT 222 02/24/2023    Diet :  Diet Order             Diet Heart Room service appropriate? Yes; Fluid consistency: Thin  Diet effective now                    Inpatient Medications  Scheduled Meds:  aspirin  300 mg Rectal Daily   Or   aspirin EC  325 mg Oral Daily   atorvastatin  40 mg Oral Daily   clopidogrel  75 mg Oral Daily   cyanocobalamin  1,000 mcg Oral Daily   enoxaparin (LOVENOX) injection  30 mg Subcutaneous Daily   levothyroxine  100 mcg Oral Daily   Continuous Infusions: PRN Meds:.acetaminophen **OR** acetaminophen (TYLENOL) oral liquid 160 mg/5 mL **OR** acetaminophen  Antibiotics  :    Anti-infectives (From admission, onward)    None         Objective:   Vitals:   02/26/23 0730 02/26/23 0740 02/26/23 0750 02/26/23 0800  BP:    (!) 145/34  Pulse: 76 73 73 68  Resp: 14 19 18 18   Temp:    98.2 F (36.8 C)  TempSrc:      SpO2:      Weight:      Height:        Wt Readings from Last 3 Encounters:  02/26/23 83.3 kg  10/17/22 79.4 kg  07/21/22 79.8 kg    No intake or output data in the 24 hours ending 02/26/23 0948    Physical Exam  Awake  Alert, No new F.N deficits, Normal affect [place].AT,PERRAL Supple Neck, No JVD,   Symmetrical Chest wall movement, Good air movement bilaterally, CTAB RRR,No Gallops,Rubs or new Murmurs,  +ve B.Sounds, Abd Soft, No tenderness,   No Cyanosis, Clubbing or edema        Data Review:    Recent Labs  Lab 02/22/23 2132 02/22/23 2136 02/23/23 0458 02/24/23 0514  WBC 7.3  --  6.8 6.0  HGB 10.5* 11.2* 9.9* 10.7*  HCT 33.8* 33.0* 31.5* 33.8*  PLT 234  --  220 222  MCV 90.9  --  89.7 88.3  MCH 28.2  --  28.2 27.9  MCHC 31.1  --  31.4 31.7  RDW 13.4  --  13.5 13.4  LYMPHSABS 2.0  --   --  2.0  MONOABS 0.4  --   --  0.6  EOSABS 0.3  --   --  0.4  BASOSABS 0.0  --   --  0.1    Recent Labs  Lab 02/22/23 2132 02/22/23 2136 02/23/23 0458 02/24/23 0514  NA 139 142  --  137  K 3.7 3.8  --  3.9  CL 109 111  --  109  CO2 22  --   --  21*  ANIONGAP 8  --   --  7  GLUCOSE 158* 154*  --  87  BUN 24* 25*  --  21  CREATININE 2.19* 2.30* 2.09* 1.83*  AST 17  --   --   --   ALT 10  --   --   --   ALKPHOS 86  --   --   --   BILITOT 0.4  --   --   --   ALBUMIN 3.5  --   --   --   INR 1.0  --   --   --   HGBA1C  --   --  5.8*  --   MG  --   --   --  2.1  CALCIUM 10.0  --   --  9.6      Recent Labs  Lab 02/22/23 2132 02/23/23 0458 02/24/23 0514  INR 1.0  --   --   HGBA1C  --  5.8*  --   MG  --   --  2.1  CALCIUM 10.0  --  9.6    --------------------------------------------------------------------------------------------------------------- Lab Results  Component Value Date   CHOL 156 02/23/2023   HDL 49 02/23/2023   LDLCALC 84 02/23/2023   LDLDIRECT 78.0 07/21/2022   TRIG 115 02/23/2023   CHOLHDL 3.2 02/23/2023    Lab Results  Component Value Date   HGBA1C 5.8 (H) 02/23/2023   No results for input(s): "TSH", "T4TOTAL", "FREET4", "T3FREE", "THYROIDAB" in the last 72 hours. No results for input(s): "VITAMINB12", "FOLATE", "FERRITIN", "TIBC", "IRON", "RETICCTPCT" in the  last 72 hours. ------------------------------------------------------------------------------------------------------------------ Cardiac Enzymes No results for input(s): "CKMB", "TROPONINI", "MYOGLOBIN" in the last 168 hours.  Invalid input(s): "CK"  Micro Results No results found for this or any previous visit (from the past 240 hours).  Radiology Reports ECHOCARDIOGRAM COMPLETE Result Date: 02/23/2023    ECHOCARDIOGRAM REPORT   Patient Name:   AYDEN APODACA Date of Exam: 02/23/2023 Medical Rec #:  [id number]    Height:       59.5 in Accession #:    [id number]   Weight:       175.0 lb Date of Birth:  1934/06/14    BSA:          1.753 m Patient Age:    88 years     BP:           107/89 mmHg Patient Gender: F            HR:           65 bpm. Exam Location:  Inpatient Procedure: 2D Echo, Cardiac Doppler and Color Doppler Indications:    Stroke  History:        Patient has no prior history of Echocardiogram examinations.                 COPD; Risk Factors:Hypertension.  Sonographer:    Amy Chionchio Referring Phys: [id number] ARSHAD N KAKRAKANDY IMPRESSIONS  1. Left ventricular ejection fraction, by estimation, is 55 to 60%. Left ventricular ejection fraction by PLAX is 59 %. The left ventricle has normal function. The left ventricle has no regional wall motion abnormalities. Left ventricular diastolic parameters are consistent with Grade I diastolic dysfunction (impaired relaxation).  2. Right ventricular systolic function is normal. The right ventricular size is normal. There is normal pulmonary artery systolic pressure.  3. The mitral valve is normal in structure. Mild mitral valve regurgitation. No evidence of mitral stenosis.  4. The aortic valve is tricuspid. Aortic valve regurgitation is not visualized. No aortic stenosis is present.  5. The inferior vena cava is normal in size with  greater than 50% respiratory variability, suggesting right atrial pressure of 3 mmHg. FINDINGS  Left Ventricle: Left  ventricular ejection fraction, by estimation, is 55 to 60%. Left ventricular ejection fraction by PLAX is 59 %. The left ventricle has normal function. The left ventricle has no regional wall motion abnormalities. The left ventricular internal cavity size was normal in size. There is no left ventricular hypertrophy. Left ventricular diastolic parameters are consistent with Grade I diastolic dysfunction (impaired relaxation). Normal left ventricular filling pressure. Right Ventricle: The right ventricular size is normal. No increase in right ventricular wall thickness. Right ventricular systolic function is normal. There is normal pulmonary artery systolic pressure. The tricuspid regurgitant velocity is 2.55 m/s, and  with an assumed right atrial pressure of 3 mmHg, the estimated right ventricular systolic pressure is 29.0 mmHg. Left Atrium: Left atrial size was normal in size. Right Atrium: Right atrial size was normal in size. Pericardium: There is no evidence of pericardial effusion. Mitral Valve: The mitral valve is normal in structure. Mild mitral valve regurgitation. No evidence of mitral valve stenosis. MV peak gradient, 3.8 mmHg. The mean mitral valve gradient is 1.0 mmHg. Tricuspid Valve: The tricuspid valve is normal in structure. Tricuspid valve regurgitation is mild . No evidence of tricuspid stenosis. Aortic Valve: The aortic valve is tricuspid. Aortic valve regurgitation is not visualized. No aortic stenosis is present. Aortic valve mean gradient measures 3.0 mmHg. Aortic valve peak gradient measures 4.5 mmHg. Aortic valve area, by VTI measures 3.08 cm. Pulmonic Valve: The pulmonic valve was normal in structure. Pulmonic valve regurgitation is trivial. No evidence of pulmonic stenosis. Aorta: The aortic root is normal in size and structure. Venous: The inferior vena cava is normal in size with greater than 50% respiratory variability, suggesting right atrial pressure of 3 mmHg. IAS/Shunts: No atrial  level shunt detected by color flow Doppler.  LEFT VENTRICLE PLAX 2D LV EF:         Left            Diastology                ventricular     LV e' medial:    7.40 cm/s                ejection        LV E/e' medial:  8.7                fraction by     LV e' lateral:   9.14 cm/s                PLAX is 59      LV E/e' lateral: 7.1                %. LVIDd:         3.90 cm LVIDs:         2.70 cm LV PW:         1.00 cm LV IVS:        0.90 cm LVOT diam:     2.10 cm LV SV:         70 LV SV Index:   40 LVOT Area:     3.46 cm  LV Volumes (MOD) LV vol d, MOD    60.9 ml A2C: LV vol d, MOD    76.1 ml A4C: LV vol s, MOD    23.7 ml A2C: LV vol s, MOD    38.2 ml A4C: LV SV MOD  A2C:   37.2 ml LV SV MOD A4C:   76.1 ml LV SV MOD BP:    39.4 ml RIGHT VENTRICLE            IVC RV Basal diam:  2.80 cm    IVC diam: 1.50 cm RV S prime:     9.68 cm/s TAPSE (M-mode): 2.0 cm LEFT ATRIUM             Index        RIGHT ATRIUM           Index LA Vol (A2C):   36.2 ml 20.65 ml/m  RA Area:     10.40 cm LA Vol (A4C):   35.4 ml 20.20 ml/m  RA Volume:   21.00 ml  11.98 ml/m LA Biplane Vol: 38.6 ml 22.02 ml/m  AORTIC VALVE                    PULMONIC VALVE AV Area (Vmax):    3.01 cm     PV Vmax:          0.76 m/s AV Area (Vmean):   2.57 cm     PV Peak grad:     2.3 mmHg AV Area (VTI):     3.08 cm     PR End Diast Vel: 4.41 msec AV Vmax:           106.00 cm/s AV Vmean:          77.600 cm/s AV VTI:            0.227 m AV Peak Grad:      4.5 mmHg AV Mean Grad:      3.0 mmHg LVOT Vmax:         92.00 cm/s LVOT Vmean:        57.600 cm/s LVOT VTI:          0.202 m LVOT/AV VTI ratio: 0.89  AORTA Ao Root diam: 2.80 cm Ao Asc diam:  2.60 cm MITRAL VALVE               TRICUSPID VALVE MV Area (PHT): 3.28 cm    TR Peak grad:   26.0 mmHg MV Area VTI:   2.60 cm    TR Vmax:        255.00 cm/s MV Peak grad:  3.8 mmHg MV Mean grad:  1.0 mmHg    SHUNTS MV Vmax:       0.97 m/s    Systemic VTI:  0.20 m MV Vmean:      49.5 cm/s   Systemic Diam: 2.10 cm MV E  velocity: 64.70 cm/s MV A velocity: 78.40 cm/s MV E/A ratio:  0.83 Chilton Si MD Electronically signed by Chilton Si MD Signature Date/Time: 02/23/2023/5:54:41 PM    Final    VAS US CAROTID (at [facility] and [facility] only) Result Date: 02/23/2023 Carotid Arterial Duplex Study Patient Name:  JAMONI BROADFOOT  Date of Exam:   02/23/2023 Medical Rec #: [id number]     Accession #:    [id number] Date of Birth: November 26, 1934     Patient Gender: F Patient Age:   6 years Exam Location:  [facility] Procedure:      VAS US CAROTID Referring Phys: Midge Minium --------------------------------------------------------------------------------  Indications:   CVA and Speech disturbance. Risk Factors:  Hypertension, hyperlipidemia, Diabetes. Other Factors: Hypothyroidism, CKD stage IV. Performing Technologist: Marilynne Halsted RDMS, RVT  Examination Guidelines: A complete evaluation includes B-mode imaging, spectral Doppler, color  Doppler, and power Doppler as needed of all accessible portions of each vessel. Bilateral testing is considered an integral part of a complete examination. Limited examinations for reoccurring indications may be performed as noted.  Right Carotid Findings: +----------+--------+--------+--------+------------------+------------------+           PSV cm/sEDV cm/sStenosisPlaque DescriptionComments           +----------+--------+--------+--------+------------------+------------------+ CCA Prox  67      12                                                   +----------+--------+--------+--------+------------------+------------------+ CCA Distal66      15                                intimal thickening +----------+--------+--------+--------+------------------+------------------+ ICA Prox  79      17                                intimal thickening +----------+--------+--------+--------+------------------+------------------+ ICA Distal77      21                                                    +----------+--------+--------+--------+------------------+------------------+ ECA       76      10                                                   +----------+--------+--------+--------+------------------+------------------+ +----------+--------+-------+----------------+-------------------+           PSV cm/sEDV cmsDescribe        Arm Pressure (mmHG) +----------+--------+-------+----------------+-------------------+ [id number]             Multiphasic, WNL                    +----------+--------+-------+----------------+-------------------+ +---------+--------+--+--------+--+---------+ VertebralPSV cm/s34EDV cm/s10Antegrade +---------+--------+--+--------+--+---------+  Left Carotid Findings: +----------+--------+--------+--------+------------------+------------------+           PSV cm/sEDV cm/sStenosisPlaque DescriptionComments           +----------+--------+--------+--------+------------------+------------------+ CCA Prox  72      17                                                   +----------+--------+--------+--------+------------------+------------------+ CCA Distal62      19                                intimal thickening +----------+--------+--------+--------+------------------+------------------+ ICA Prox  54      14                                                   +----------+--------+--------+--------+------------------+------------------+ ICA Mid   70  25                                                   +----------+--------+--------+--------+------------------+------------------+ ICA Distal111     27                                tortuous           +----------+--------+--------+--------+------------------+------------------+ ECA       60      9                                                    +----------+--------+--------+--------+------------------+------------------+  +----------+--------+--------+----------------+-------------------+           PSV cm/sEDV cm/sDescribe        Arm Pressure (mmHG) +----------+--------+--------+----------------+-------------------+ [id number]             Multiphasic, WNL                    +----------+--------+--------+----------------+-------------------+ +---------+--------+--+--------+--+ VertebralPSV cm/s68EDV cm/s10 +---------+--------+--+--------+--+   Summary: Right Carotid: The extracranial vessels were near-normal with only minimal wall                thickening or plaque. Left Carotid: The extracranial vessels were near-normal with only minimal wall               thickening or plaque. Vertebrals:  Bilateral vertebral arteries demonstrate antegrade flow. Subclavians: Normal flow hemodynamics were seen in bilateral subclavian              arteries. *See table(s) above for measurements and observations.  Electronically signed by Delia Heady MD on 02/23/2023 at 5:08:22 PM.    Final    MR ANGIO HEAD WO CONTRAST Result Date: 02/23/2023 CLINICAL DATA:  Provided history: Stroke, follow-up. EXAM: MRA HEAD WITHOUT CONTRAST TECHNIQUE: Angiographic images of the Circle of Willis were acquired using MRA technique without intravenous contrast. COMPARISON:  Brain MRI 02/23/2023. FINDINGS: Anterior circulation: The intracranial internal carotid arteries are patent. The M1 middle cerebral arteries are patent. No M2 proximal branch occlusion or high-grade proximal stenosis. The anterior cerebral arteries are patent. 2 mm posteriorly projecting vascular protrusion arising from the right ICA at the petrous-cavernous junction, which may reflect an aneurysm or infundibulum (series 350, image 4). 2 mm laterally projecting vascular protrusion arising from the cavernous right internal carotid artery, which may reflect an aneurysm or infundibulum (series 3, image 57) series 350, image 4). 3 mm medially projecting vascular protrusion arising  from the cavernous left internal carotid artery consistent with an aneurysm (series 3, image 64) series 351, image 9). Posterior circulation: The intracranial vertebral arteries are patent. The left vertebral artery is dominant. The basilar artery is patent. The posterior cerebral arteries are patent. Posterior communicating arteries are present bilaterally. Anatomic variants: As described. IMPRESSION: 1. No proximal intracranial large vessel occlusion or high-grade proximal arterial stenosis identified. 2. 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 2 mm vascular protrusion arising from the right internal carotid artery at the petrous-cavernous junction, which may reflect an aneurysm or infundibulum. 2 mm vascular protrusion arising from the cavernous right internal carotid artery more distally,  which may reflect an aneurysm or infundibulum. Neurointerventional consultation recommended. Electronically Signed   By: Jackey Loge D.O.   On: 02/23/2023 09:50   MR BRAIN WO CONTRAST Result Date: 02/23/2023 CLINICAL DATA:  Initial evaluation for acute neuro deficit, stroke suspected. EXAM: MRI HEAD WITHOUT CONTRAST TECHNIQUE: Multiplanar, multiecho pulse sequences of the brain and surrounding structures were obtained without intravenous contrast. COMPARISON:  CT from 05/19/2022 FINDINGS: Brain: Examination mildly degraded by motion artifact. Mild age-related cerebral atrophy. Patchy T2/FLAIR hyperintensity involving the periventricular deep white matter both cerebral hemispheres, consistent with chronic small vessel ischemic disease, mild for age. Few scattered superimposed remote lacunar infarcts present about the hemispheric cerebral white matter. Remote cortical subcortical infarct noted at the posterior right frontal region. Few scattered small remote bilateral cerebellar infarcts noted. Minimal chronic hemosiderin staining noted about a few of these infarcts. Patchy restricted diffusion involving the  parasagittal left frontal lobe, consistent with acute ischemic infarct (series 5, images 79, 81). Additional subcentimeter acute to subacute ischemic infarct noted at the contralateral subcortical right frontal lobe (series 5, image 80). Subcentimeter acute to early subacute infarct at the superior left thalamus (series 5, image 77). Mild associated petechial blood products at the left frontal infarct (series 12, image 42). No other associated hemorrhage. No other acute or chronic intracranial blood products. No mass lesion, midline shift or mass effect. No hydrocephalus or extra-axial fluid collection. Pituitary gland suprasellar region within normal limits. Vascular: Major intracranial vascular flow voids are maintained. Skull and upper cervical spine: Craniocervical junction within normal limits. Bone marrow signal intensity grossly normal. No scalp soft tissue abnormality. Sinuses/Orbits: Prior bilateral ocular lens replacement. 1 cm mildly complex cystic focus noted at the lateral margin of the right orbit, nonspecific, but likely benign and of doubtful significance. Paranasal sinuses are largely clear. Small left mastoid effusion, of doubtful significance. Negative nasopharynx. Other: None. IMPRESSION: 1. Patchy acute ischemic infarct involving the parasagittal left frontal lobe. Minimal associated petechial blood products without hemorrhagic transformation or significant mass effect. 2. Additional subcentimeter acute to early subacute ischemic nonhemorrhagic infarcts involving the subcortical right frontal lobe and superior left thalamus. 3. Underlying age-related cerebral atrophy with chronic microvascular ischemic disease, with a few scattered remote lacunar infarcts about the hemispheric cerebral white matter and cerebellum. Electronically Signed   By: Rise Mu M.D.   On: 02/23/2023 02:24      Signature  -   Susa Raring M.D on 02/26/2023 at 9:48 AM   -  To page go to [email or web address]

## 2023-02-26 NOTE — Progress Notes (Signed)
Patient is alert, awake , resting quietly. NIH score 2. Stable vital signs.

## 2023-02-27 ENCOUNTER — Other Ambulatory Visit (HOSPITAL_COMMUNITY): Payer: Self-pay

## 2023-02-27 DIAGNOSIS — I639 Cerebral infarction, unspecified: Secondary | ICD-10-CM | POA: Diagnosis not present

## 2023-02-27 MED ORDER — CLOPIDOGREL BISULFATE 75 MG PO TABS
75.0000 mg | ORAL_TABLET | Freq: Every day | ORAL | 0 refills | Status: DC
  Filled 2023-02-27: qty 18, 18d supply, fill #0

## 2023-02-27 MED ORDER — ATORVASTATIN CALCIUM 40 MG PO TABS
40.0000 mg | ORAL_TABLET | Freq: Every day | ORAL | 0 refills | Status: DC
  Filled 2023-02-27: qty 30, 30d supply, fill #0

## 2023-02-27 MED ORDER — ASPIRIN 81 MG PO CHEW
81.0000 mg | CHEWABLE_TABLET | Freq: Every day | ORAL | Status: DC
  Administered 2023-02-27 – 2023-02-28 (×2): 81 mg via ORAL
  Filled 2023-02-27 (×2): qty 1

## 2023-02-27 MED ORDER — ASPIRIN 81 MG PO TBEC
81.0000 mg | DELAYED_RELEASE_TABLET | Freq: Every day | ORAL | 3 refills | Status: DC
  Filled 2023-02-27: qty 30, 30d supply, fill #0

## 2023-02-27 NOTE — Care Management Important Message (Signed)
Important Message  Patient Details  Name: Lynn Ray MRN: [id number] Date of Birth: 02-07-1934   Important Message Given:  Yes - Medicare IM     Dorena Bodo 02/27/2023, 4:46 PM

## 2023-02-27 NOTE — Progress Notes (Signed)
Occupational Therapy Treatment Patient Details Name: Lynn Ray MRN: [id number] DOB: 09-24-1934 Today's Date: 02/27/2023   History of present illness 89 yo female demonstrates expressive deficits and L facial droop. 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   OT comments  Pt presented in bed and agreeable to therapy with daughter present in the room. Pt reported if they do not get accepted to [facility] they will want to return home. She noted to increase in ability to complete tasks as was able to completed LB dressing with CGA with increase in time. She was able to complete room level mobility and in hallway with no hand held support. Pt was able to scan environment for hazards and locating 5/5 items with no difficulties. Patient will benefit from intensive inpatient follow up therapy, >3 hours/day.      If plan is discharge home, recommend the following:  A little help with walking and/or transfers;A little help with bathing/dressing/bathroom;Assistance with cooking/housework;Direct supervision/assist for medications management;Direct supervision/assist for financial management;Assist for transportation;Help with stairs or ramp for entrance   Equipment Recommendations   (TBD at next level)    Recommendations for Other Services Rehab consult    Precautions / Restrictions Precautions Precautions: Fall Precaution Comments: watch bp Restrictions Weight Bearing Restrictions Per Provider Order: No       Mobility Bed Mobility Overal bed mobility: Needs Assistance Bed Mobility: Supine to Sit, Sit to Supine     Supine to sit: Modified independent (Device/Increase time) Sit to supine: Modified independent (Device/Increase time)   General bed mobility comments: increase in time    Transfers Overall transfer level: Needs assistance   Transfers: Sit to/from Stand Sit to Stand: Supervision                  Balance Overall balance assessment: Needs assistance Sitting-balance support: Feet supported Sitting balance-Leahy Scale: Good     Standing balance support: No upper extremity supported Standing balance-Leahy Scale: Fair Standing balance comment: Pt did not attempt to reach out for items to balance self and noted only slight fatigue at the end of session                           ADL either performed or assessed with clinical judgement   ADL Overall ADL's : Needs assistance/impaired Eating/Feeding: Independent;Sitting   Grooming: Wash/dry face;Modified independent;Sitting   Upper Body Bathing: Supervision/ safety;Sitting   Lower Body Bathing: Contact guard assist;Sit to/from stand;Minimal assistance   Upper Body Dressing : Supervision/safety;Sitting   Lower Body Dressing: Contact guard assist;Sit to/from stand   Toilet Transfer: Contact guard assist;Cueing for safety;Cueing for sequencing           Functional mobility during ADLs: Contact guard assist;Cueing for safety;Cueing for sequencing      Extremity/Trunk Assessment Upper Extremity Assessment Upper Extremity Assessment: RUE deficits/detail RUE Deficits / Details: decrease in FM coordination and strength in RUE RUE Sensation: WNL RUE Coordination: decreased gross motor   Lower Extremity Assessment Lower Extremity Assessment: Defer to PT evaluation        Vision   Additional Comments: Pt was able to scan enviroment and noted no decrease in changes in R side from last session noted slight difference in R to L side   Perception     Praxis      Cognition Arousal: Alert Behavior During Therapy: [facility] for tasks assessed/performed Overall Cognitive  Status: Impaired/Different from baseline Area of Impairment: Safety/judgement                       Following Commands: Follows multi-step commands with increased time Safety/Judgement: Decreased awareness of safety   Problem Solving: Slow  processing General Comments: Pt noted to increase in ability to scan enviroment and decrease in any cues but more for line management at this time        Exercises      Shoulder Instructions       General Comments      Pertinent Vitals/ Pain       Pain Assessment Pain Assessment: No/denies pain  Home Living                                          Prior Functioning/Environment              Frequency  Min 1X/week        Progress Toward Goals  OT Goals(current goals can now be found in the care plan section)  Progress towards OT goals: Progressing toward goals  Acute Rehab OT Goals Patient Stated Goal: to go to rehab OT Goal Formulation: With patient Time For Goal Achievement: 03/10/23 Potential to Achieve Goals: Good ADL Goals Pt Will Perform Upper Body Bathing: Independently Pt Will Perform Lower Body Bathing: with modified independence;sit to/from stand Pt Will Transfer to Toilet: with modified independence;ambulating Pt Will Perform Toileting - Clothing Manipulation and hygiene: with modified independence;sit to/from stand Pt Will Perform Tub/Shower Transfer: Shower transfer;with modified independence;ambulating;shower seat  Plan      Co-evaluation                 AM-PAC OT "6 Clicks" Daily Activity     Outcome Measure   Help from another person eating meals?: None Help from another person taking care of personal grooming?: A Little Help from another person toileting, which includes using toliet, bedpan, or urinal?: A Little Help from another person bathing (including washing, rinsing, drying)?: A Little Help from another person to put on and taking off regular upper body clothing?: A Little Help from another person to put on and taking off regular lower body clothing?: A Little 6 Click Score: 19    End of Session Equipment Utilized During Treatment: Gait belt  OT Visit Diagnosis: Unsteadiness on feet (R26.81);Other  abnormalities of gait and mobility (R26.89);Muscle weakness (generalized) (M62.81)   Activity Tolerance Patient tolerated treatment well   Patient Left in chair;with call bell/phone within reach;with family/visitor present   Nurse Communication Mobility status        Time: 3664-4034 OT Time Calculation (min): 29 min  Charges: OT General Charges $OT Visit: 1 Visit OT Treatments $Self Care/Home Management : 23-37 mins  Presley Raddle OTR/L  Acute Rehab Services  [phone] office number   Alphia Moh 02/27/2023, 9:39 AM

## 2023-02-27 NOTE — Care Management Important Message (Signed)
Important Message  Patient Details  Name: Lynn Ray MRN: [id number] Date of Birth: 1934/02/26   Important Message Given:  Yes - Medicare IM     Dorena Bodo 02/27/2023, 4:47 PM

## 2023-02-27 NOTE — Progress Notes (Signed)
Inpatient Rehab Admissions Coordinator:   Awaiting determination from [facility] regarding CIR prior auth request.  Provided updated info to them this morning per their request.  Estill Dooms, PT, DPT Admissions Coordinator [phone] 02/27/23  11:18 AM

## 2023-02-27 NOTE — Plan of Care (Signed)

## 2023-02-27 NOTE — Discharge Instructions (Signed)
Follow with Primary MD [name], MD in 7 days   Get CBC, CMP, 2 view Chest X ray -  checked next visit with your primary MD or CIR MD    Activity: As tolerated with Full fall precautions use walker/cane & assistance as needed  Disposition CIR/Home  Diet: Heart Healthy  with feeding assistance and aspiration precautions.    Special Instructions: If you have smoked or chewed Tobacco  in the last 2 yrs please stop smoking, stop any regular Alcohol  and or any Recreational drug use.  On your next visit with your primary care physician please Get Medicines reviewed and adjusted.  Please request your Prim.MD to go over all Hospital Tests and Procedure/Radiological results at the follow up, please get all Hospital records sent to your Prim MD by signing hospital release before you go home.  If you experience worsening of your admission symptoms, develop shortness of breath, life threatening emergency, suicidal or homicidal thoughts you must seek medical attention immediately by calling 911 or calling your MD immediately  if symptoms less severe.  You Must read complete instructions/literature along with all the possible adverse reactions/side effects for all the Medicines you take and that have been prescribed to you. Take any new Medicines after you have completely understood and accpet all the possible adverse reactions/side effects.   Do not drive when taking Pain medications.  Do not take more than prescribed Pain, Sleep and Anxiety Medications

## 2023-02-27 NOTE — Discharge Summary (Signed)
Lynn Ray GNF:[id number] DOB: [birth year] DOA: 02/22/2023  PCP: [name], MD  Admit date: 02/22/2023  Discharge date: 02/27/2023  Admitted From: Home   Disposition:  CIR/Home   Recommendations for Outpatient Follow-up:   Follow up with PCP in 1-2 weeks  PCP Please obtain BMP/CBC, 2 view CXR in 1week,  (see Discharge instructions)   PCP Please follow up on the following pending results:    Home Health: PT, OT, SLP if qulifies   Equipment/Devices: as below  Consultations: Neuro Discharge Condition: Stable    CODE STATUS: Full    Diet Recommendation: Heart Healthy     Chief Complaint  Patient presents with   Aphasia   Facial Droop     Brief history of present illness from the day of admission and additional interim summary    89 y.o. female with history of hypertension, postoperative hypothyroidism, chronic kidney disease stage IV, chronic anemia, MGUS, prediabetes, rectal cancer was brought to the ER after patient was noticed to have difficulty expressing words.  As per patient's daughter who provided most of the history patient's son initially called yesterday morning at around 10 AM when patient talked normally.  At around 1 PM patient's son again called on the patient through the phone when patient's son felt that patient had some difficulty talking.  Later last evening when patient's daughter came to check on the patient patient was finding it difficult to express words.  Patient was brought to the ER for stroke workup.  There was also some concern for left facial droop.  The workup confirmed acute ischemic stroke in the right frontal and left thalamic area and she was admitted.                                                                  Hospital Course   Acute CVA -    appreciate  neurology consult.  Stroke workup done, currently on DAPT for 3 weeks thereafter aspirin alone, LDL above goal hence added statin, PT OT and speech eval, may require CIR/rehab.  Stable echo and carotid duplex.  A1c stable, needs a 30-day event monitor upon discharge, cardiology has arranged for it. Await CIR bed patient remarkably better, wants to go home on 02/28/2023 if CIR bed is not arranged. Hypertension will allow for permissive hypertension hold hydrochlorothiazide.  Continue metoprolol upon discharge resume on 02/28/2023. Postoperative hypothyroidism on Synthroid. Chronic kidney disease stage IV creatinine at around baseline. Chronic anemia likely from renal disease and also being followed by oncologist for MGUS.  Takes B12 supplement.  Patient follow-up with PCP and hematologist. Dyslipidemia.  Placed on statin.    Discharge diagnosis     Principal Problem:   Acute CVA (cerebrovascular accident) [facility]) Active Problems:   Hypothyroidism   Essential hypertension  CKD (chronic kidney disease) stage 4, GFR 15-29 ml/min (HCC)   Anemia    Discharge instructions    Discharge Instructions     Diet - low sodium heart healthy   Complete by: As directed    Discharge instructions   Complete by: As directed    Follow with Primary MD [name], MD in 7 days   Get CBC, CMP, 2 view Chest X ray -  checked next visit with your primary MD or CIR MD    Activity: As tolerated with Full fall precautions use walker/cane & assistance as needed  Disposition CIR/Home  Diet: Heart Healthy  with feeding assistance and aspiration precautions.    Special Instructions: If you have smoked or chewed Tobacco  in the last 2 yrs please stop smoking, stop any regular Alcohol  and or any Recreational drug use.  On your next visit with your primary care physician please Get Medicines reviewed and adjusted.  Please request your Prim.MD to go over all Hospital Tests and Procedure/Radiological  results at the follow up, please get all Hospital records sent to your Prim MD by signing hospital release before you go home.  If you experience worsening of your admission symptoms, develop shortness of breath, life threatening emergency, suicidal or homicidal thoughts you must seek medical attention immediately by calling 911 or calling your MD immediately  if symptoms less severe.  You Must read complete instructions/literature along with all the possible adverse reactions/side effects for all the Medicines you take and that have been prescribed to you. Take any new Medicines after you have completely understood and accpet all the possible adverse reactions/side effects.   Do not drive when taking Pain medications.  Do not take more than prescribed Pain, Sleep and Anxiety Medications   Increase activity slowly   Complete by: As directed        Discharge Medications   Allergies as of 02/27/2023       Reactions   Aspirin Other (See Comments)   Severe abdominal pain   Tape Rash   adhesive        Medication List     STOP taking these medications    Spiriva Respimat 2.5 MCG/ACT Aers Generic drug: Tiotropium Bromide Monohydrate       TAKE these medications    aspirin EC 81 MG tablet Take 1 tablet (81 mg total) by mouth daily.   atorvastatin 40 MG tablet Commonly known as: LIPITOR Take 1 tablet (40 mg total) by mouth daily.   cholecalciferol 25 MCG (1000 UNIT) tablet Commonly known as: VITAMIN D3 Take 1,000 Units by mouth daily.   clopidogrel 75 MG tablet Commonly known as: PLAVIX Take 1 tablet (75 mg total) by mouth daily.   [employer] LUBRICANT EYE DROPS OP Apply 2 drops to eye 2 (two) times daily as needed.   cyanocobalamin 100 MCG tablet Commonly known as: VITAMIN B12 Take 100 mcg by mouth daily.   hydrochlorothiazide 12.5 MG tablet Commonly known as: HYDRODIURIL Take 12.5 mg by mouth daily.   levothyroxine 100 MCG tablet Commonly known as: SYNTHROID TAKE 1  TABLET (100 MCG TOTAL) BY MOUTH DAILY. FOLLOW-UP DUE IN JUNE WITH LABS   metoprolol succinate 50 MG 24 hr tablet Commonly known as: TOPROL-XL TAKE 1 TABLET BY MOUTH EVERY DAY         Follow-up Information     [name], MD. Schedule an appointment as soon as possible for a visit in 1 week(s).   Specialty:  Internal Medicine Contact information: [address] [place] [address] [phone]         [facility]. Schedule an appointment as soon as possible for a visit in 3 week(s).   Contact information: [address]     [address] [place] [address] [phone]                Major procedures and Radiology Reports - PLEASE review detailed and final reports thoroughly  -      ECHOCARDIOGRAM COMPLETE Result Date: 02/23/2023    ECHOCARDIOGRAM REPORT   Patient Name:   Lynn Ray Date of Exam: 02/23/2023 Medical Rec #:  [id number]    Height:       59.5 in Accession #:    [id number]   Weight:       175.0 lb Date of Birth:  05-Jan-1935    BSA:          1.753 m Patient Age:    88 years     BP:           107/89 mmHg Patient Gender: F            HR:           65 bpm. Exam Location:  Inpatient Procedure: 2D Echo, Cardiac Doppler and Color Doppler Indications:    Stroke  History:        Patient has no prior history of Echocardiogram examinations.                 COPD; Risk Factors:Hypertension.  Sonographer:    Amy Chionchio Referring Phys: [id number] ARSHAD N KAKRAKANDY IMPRESSIONS  1. Left ventricular ejection fraction, by estimation, is 55 to 60%. Left ventricular ejection fraction by PLAX is 59 %. The left ventricle has normal function. The left ventricle has no regional wall motion abnormalities. Left ventricular diastolic parameters are consistent with Grade I diastolic dysfunction (impaired relaxation).  2. Right ventricular systolic function is normal. The right ventricular size is normal. There is normal pulmonary artery  systolic pressure.  3. The mitral valve is normal in structure. Mild mitral valve regurgitation. No evidence of mitral stenosis.  4. The aortic valve is tricuspid. Aortic valve regurgitation is not visualized. No aortic stenosis is present.  5. The inferior vena cava is normal in size with greater than 50% respiratory variability, suggesting right atrial pressure of 3 mmHg. FINDINGS  Left Ventricle: Left ventricular ejection fraction, by estimation, is 55 to 60%. Left ventricular ejection fraction by PLAX is 59 %. The left ventricle has normal function. The left ventricle has no regional wall motion abnormalities. The left ventricular internal cavity size was normal in size. There is no left ventricular hypertrophy. Left ventricular diastolic parameters are consistent with Grade I diastolic dysfunction (impaired relaxation). Normal left ventricular filling pressure. Right Ventricle: The right ventricular size is normal. No increase in right ventricular wall thickness. Right ventricular systolic function is normal. There is normal pulmonary artery systolic pressure. The tricuspid regurgitant velocity is 2.55 m/s, and  with an assumed right atrial pressure of 3 mmHg, the estimated right ventricular systolic pressure is 29.0 mmHg. Left Atrium: Left atrial size was normal in size. Right Atrium: Right atrial size was normal in size. Pericardium: There is no evidence of pericardial effusion. Mitral Valve: The mitral valve is normal in structure. Mild mitral valve regurgitation. No evidence of mitral valve stenosis. MV peak gradient, 3.8 mmHg. The mean mitral valve gradient is 1.0 mmHg. Tricuspid  Valve: The tricuspid valve is normal in structure. Tricuspid valve regurgitation is mild . No evidence of tricuspid stenosis. Aortic Valve: The aortic valve is tricuspid. Aortic valve regurgitation is not visualized. No aortic stenosis is present. Aortic valve mean gradient measures 3.0 mmHg. Aortic valve peak gradient measures  4.5 mmHg. Aortic valve area, by VTI measures 3.08 cm. Pulmonic Valve: The pulmonic valve was normal in structure. Pulmonic valve regurgitation is trivial. No evidence of pulmonic stenosis. Aorta: The aortic root is normal in size and structure. Venous: The inferior vena cava is normal in size with greater than 50% respiratory variability, suggesting right atrial pressure of 3 mmHg. IAS/Shunts: No atrial level shunt detected by color flow Doppler.  LEFT VENTRICLE PLAX 2D LV EF:         Left            Diastology                ventricular     LV e' medial:    7.40 cm/s                ejection        LV E/e' medial:  8.7                fraction by     LV e' lateral:   9.14 cm/s                PLAX is 59      LV E/e' lateral: 7.1                %. LVIDd:         3.90 cm LVIDs:         2.70 cm LV PW:         1.00 cm LV IVS:        0.90 cm LVOT diam:     2.10 cm LV SV:         70 LV SV Index:   40 LVOT Area:     3.46 cm  LV Volumes (MOD) LV vol d, MOD    60.9 ml A2C: LV vol d, MOD    76.1 ml A4C: LV vol s, MOD    23.7 ml A2C: LV vol s, MOD    38.2 ml A4C: LV SV MOD A2C:   37.2 ml LV SV MOD A4C:   76.1 ml LV SV MOD BP:    39.4 ml RIGHT VENTRICLE            IVC RV Basal diam:  2.80 cm    IVC diam: 1.50 cm RV S prime:     9.68 cm/s TAPSE (M-mode): 2.0 cm LEFT ATRIUM             Index        RIGHT ATRIUM           Index LA Vol (A2C):   36.2 ml 20.65 ml/m  RA Area:     10.40 cm LA Vol (A4C):   35.4 ml 20.20 ml/m  RA Volume:   21.00 ml  11.98 ml/m LA Biplane Vol: 38.6 ml 22.02 ml/m  AORTIC VALVE                    PULMONIC VALVE AV Area (Vmax):    3.01 cm     PV Vmax:          0.76 m/s AV Area (Vmean):   2.57 cm  PV Peak grad:     2.3 mmHg AV Area (VTI):     3.08 cm     PR End Diast Vel: 4.41 msec AV Vmax:           106.00 cm/s AV Vmean:          77.600 cm/s AV VTI:            0.227 m AV Peak Grad:      4.5 mmHg AV Mean Grad:      3.0 mmHg LVOT Vmax:         92.00 cm/s LVOT Vmean:        57.600 cm/s LVOT VTI:           0.202 m LVOT/AV VTI ratio: 0.89  AORTA Ao Root diam: 2.80 cm Ao Asc diam:  2.60 cm MITRAL VALVE               TRICUSPID VALVE MV Area (PHT): 3.28 cm    TR Peak grad:   26.0 mmHg MV Area VTI:   2.60 cm    TR Vmax:        255.00 cm/s MV Peak grad:  3.8 mmHg MV Mean grad:  1.0 mmHg    SHUNTS MV Vmax:       0.97 m/s    Systemic VTI:  0.20 m MV Vmean:      49.5 cm/s   Systemic Diam: 2.10 cm MV E velocity: 64.70 cm/s MV A velocity: 78.40 cm/s MV E/A ratio:  0.83 Chilton Si MD Electronically signed by Chilton Si MD Signature Date/Time: 02/23/2023/5:54:41 PM    Final    VAS US CAROTID (at [facility] and [facility] only) Result Date: 02/23/2023 Carotid Arterial Duplex Study Patient Name:  Lynn Ray  Date of Exam:   02/23/2023 Medical Rec #: [id number]     Accession #:    [id number] Date of Birth: 05/19/1934     Patient Gender: F Patient Age:   33 years Exam Location:  [facility] Procedure:      VAS US CAROTID Referring Phys: Midge Minium --------------------------------------------------------------------------------  Indications:   CVA and Speech disturbance. Risk Factors:  Hypertension, hyperlipidemia, Diabetes. Other Factors: Hypothyroidism, CKD stage IV. Performing Technologist: Marilynne Halsted RDMS, RVT  Examination Guidelines: A complete evaluation includes B-mode imaging, spectral Doppler, color Doppler, and power Doppler as needed of all accessible portions of each vessel. Bilateral testing is considered an integral part of a complete examination. Limited examinations for reoccurring indications may be performed as noted.  Right Carotid Findings: +----------+--------+--------+--------+------------------+------------------+           PSV cm/sEDV cm/sStenosisPlaque DescriptionComments           +----------+--------+--------+--------+------------------+------------------+ CCA Prox  67      12                                                    +----------+--------+--------+--------+------------------+------------------+ CCA Distal66      15                                intimal thickening +----------+--------+--------+--------+------------------+------------------+ ICA Prox  79      17  intimal thickening +----------+--------+--------+--------+------------------+------------------+ ICA Distal77      21                                                   +----------+--------+--------+--------+------------------+------------------+ ECA       76      10                                                   +----------+--------+--------+--------+------------------+------------------+ +----------+--------+-------+----------------+-------------------+           PSV cm/sEDV cmsDescribe        Arm Pressure (mmHG) +----------+--------+-------+----------------+-------------------+ [id number]             Multiphasic, WNL                    +----------+--------+-------+----------------+-------------------+ +---------+--------+--+--------+--+---------+ VertebralPSV cm/s34EDV cm/s10Antegrade +---------+--------+--+--------+--+---------+  Left Carotid Findings: +----------+--------+--------+--------+------------------+------------------+           PSV cm/sEDV cm/sStenosisPlaque DescriptionComments           +----------+--------+--------+--------+------------------+------------------+ CCA Prox  72      17                                                   +----------+--------+--------+--------+------------------+------------------+ CCA Distal62      19                                intimal thickening +----------+--------+--------+--------+------------------+------------------+ ICA Prox  54      14                                                   +----------+--------+--------+--------+------------------+------------------+ ICA Mid   70      25                                                    +----------+--------+--------+--------+------------------+------------------+ ICA Distal111     27                                tortuous           +----------+--------+--------+--------+------------------+------------------+ ECA       60      9                                                    +----------+--------+--------+--------+------------------+------------------+ +----------+--------+--------+----------------+-------------------+           PSV cm/sEDV cm/sDescribe        Arm Pressure (mmHG) +----------+--------+--------+----------------+-------------------+ [id number]  Multiphasic, WNL                    +----------+--------+--------+----------------+-------------------+ +---------+--------+--+--------+--+ VertebralPSV cm/s68EDV cm/s10 +---------+--------+--+--------+--+   Summary: Right Carotid: The extracranial vessels were near-normal with only minimal wall                thickening or plaque. Left Carotid: The extracranial vessels were near-normal with only minimal wall               thickening or plaque. Vertebrals:  Bilateral vertebral arteries demonstrate antegrade flow. Subclavians: Normal flow hemodynamics were seen in bilateral subclavian              arteries. *See table(s) above for measurements and observations.  Electronically signed by Delia Heady MD on 02/23/2023 at 5:08:22 PM.    Final    MR ANGIO HEAD WO CONTRAST Result Date: 02/23/2023 CLINICAL DATA:  Provided history: Stroke, follow-up. EXAM: MRA HEAD WITHOUT CONTRAST TECHNIQUE: Angiographic images of the Circle of Willis were acquired using MRA technique without intravenous contrast. COMPARISON:  Brain MRI 02/23/2023. FINDINGS: Anterior circulation: The intracranial internal carotid arteries are patent. The M1 middle cerebral arteries are patent. No M2 proximal branch occlusion or high-grade proximal stenosis. The anterior cerebral arteries are patent. 2 mm  posteriorly projecting vascular protrusion arising from the right ICA at the petrous-cavernous junction, which may reflect an aneurysm or infundibulum (series 350, image 4). 2 mm laterally projecting vascular protrusion arising from the cavernous right internal carotid artery, which may reflect an aneurysm or infundibulum (series 3, image 57) series 350, image 4). 3 mm medially projecting vascular protrusion arising from the cavernous left internal carotid artery consistent with an aneurysm (series 3, image 64) series 351, image 9). Posterior circulation: The intracranial vertebral arteries are patent. The left vertebral artery is dominant. The basilar artery is patent. The posterior cerebral arteries are patent. Posterior communicating arteries are present bilaterally. Anatomic variants: As described. IMPRESSION: 1. No proximal intracranial large vessel occlusion or high-grade proximal arterial stenosis identified. 2. 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 2 mm vascular protrusion arising from the right internal carotid artery at the petrous-cavernous junction, which may reflect an aneurysm or infundibulum. 2 mm vascular protrusion arising from the cavernous right internal carotid artery more distally, which may reflect an aneurysm or infundibulum. Neurointerventional consultation recommended. Electronically Signed   By: Jackey Loge D.O.   On: 02/23/2023 09:50   MR BRAIN WO CONTRAST Result Date: 02/23/2023 CLINICAL DATA:  Initial evaluation for acute neuro deficit, stroke suspected. EXAM: MRI HEAD WITHOUT CONTRAST TECHNIQUE: Multiplanar, multiecho pulse sequences of the brain and surrounding structures were obtained without intravenous contrast. COMPARISON:  CT from 05/19/2022 FINDINGS: Brain: Examination mildly degraded by motion artifact. Mild age-related cerebral atrophy. Patchy T2/FLAIR hyperintensity involving the periventricular deep white matter both cerebral hemispheres, consistent with  chronic small vessel ischemic disease, mild for age. Few scattered superimposed remote lacunar infarcts present about the hemispheric cerebral white matter. Remote cortical subcortical infarct noted at the posterior right frontal region. Few scattered small remote bilateral cerebellar infarcts noted. Minimal chronic hemosiderin staining noted about a few of these infarcts. Patchy restricted diffusion involving the parasagittal left frontal lobe, consistent with acute ischemic infarct (series 5, images 79, 81). Additional subcentimeter acute to subacute ischemic infarct noted at the contralateral subcortical right frontal lobe (series 5, image 80). Subcentimeter acute to early subacute infarct at the superior left thalamus (series 5, image 77). Mild associated petechial blood products at  the left frontal infarct (series 12, image 42). No other associated hemorrhage. No other acute or chronic intracranial blood products. No mass lesion, midline shift or mass effect. No hydrocephalus or extra-axial fluid collection. Pituitary gland suprasellar region within normal limits. Vascular: Major intracranial vascular flow voids are maintained. Skull and upper cervical spine: Craniocervical junction within normal limits. Bone marrow signal intensity grossly normal. No scalp soft tissue abnormality. Sinuses/Orbits: Prior bilateral ocular lens replacement. 1 cm mildly complex cystic focus noted at the lateral margin of the right orbit, nonspecific, but likely benign and of doubtful significance. Paranasal sinuses are largely clear. Small left mastoid effusion, of doubtful significance. Negative nasopharynx. Other: None. IMPRESSION: 1. Patchy acute ischemic infarct involving the parasagittal left frontal lobe. Minimal associated petechial blood products without hemorrhagic transformation or significant mass effect. 2. Additional subcentimeter acute to early subacute ischemic nonhemorrhagic infarcts involving the subcortical right  frontal lobe and superior left thalamus. 3. Underlying age-related cerebral atrophy with chronic microvascular ischemic disease, with a few scattered remote lacunar infarcts about the hemispheric cerebral white matter and cerebellum. Electronically Signed   By: Rise Mu M.D.   On: 02/23/2023 02:24    Micro Results    No results found for this or any previous visit (from the past 240 hours).  Today   Subjective    Lynn Ray today has no headache,no chest abdominal pain,no new weakness tingling or numbness, feels much better wants to go home today.    Objective   Blood pressure (!) 115/53, pulse 65, temperature 98 F (36.7 C), temperature source Oral, resp. rate 14, height 4' 11.5" (1.511 m), weight 82.9 kg, SpO2 99%.  No intake or output data in the 24 hours ending 02/27/23 0814  Exam  Awake Alert, No new F.N deficits,    [place].AT,PERRAL Supple Neck,   Symmetrical Chest wall movement, Good air movement bilaterally, CTAB RRR,No Gallops,   +ve B.Sounds, Abd Soft, Non tender,  No Cyanosis, Clubbing or edema    Data Review   Recent Labs  Lab 02/22/23 2132 02/22/23 2136 02/23/23 0458 02/24/23 0514  WBC 7.3  --  6.8 6.0  HGB 10.5* 11.2* 9.9* 10.7*  HCT 33.8* 33.0* 31.5* 33.8*  PLT 234  --  220 222  MCV 90.9  --  89.7 88.3  MCH 28.2  --  28.2 27.9  MCHC 31.1  --  31.4 31.7  RDW 13.4  --  13.5 13.4  LYMPHSABS 2.0  --   --  2.0  MONOABS 0.4  --   --  0.6  EOSABS 0.3  --   --  0.4  BASOSABS 0.0  --   --  0.1    Recent Labs  Lab 02/22/23 2132 02/22/23 2136 02/23/23 0458 02/24/23 0514  NA 139 142  --  137  K 3.7 3.8  --  3.9  CL 109 111  --  109  CO2 22  --   --  21*  ANIONGAP 8  --   --  7  GLUCOSE 158* 154*  --  87  BUN 24* 25*  --  21  CREATININE 2.19* 2.30* 2.09* 1.83*  AST 17  --   --   --   ALT 10  --   --   --   ALKPHOS 86  --   --   --   BILITOT 0.4  --   --   --   ALBUMIN 3.5  --   --   --  INR 1.0  --   --   --   HGBA1C  --   --  5.8*   --   MG  --   --   --  2.1  CALCIUM 10.0  --   --  9.6    Total Time in preparing paper work, data evaluation and todays exam - 35 minutes  Signature  -    Susa Raring M.D on 02/27/2023 at 8:14 AM   -  To page go to [email or web address]

## 2023-02-28 ENCOUNTER — Encounter (HOSPITAL_COMMUNITY): Payer: Self-pay | Admitting: Physical Medicine & Rehabilitation

## 2023-02-28 ENCOUNTER — Inpatient Hospital Stay (HOSPITAL_COMMUNITY)
Admission: AD | Admit: 2023-02-28 | Discharge: 2023-03-09 | DRG: 057 | Disposition: A | Discharge status: Home Health | Payer: Medicare PPO | Source: Intra-hospital | Attending: Physical Medicine & Rehabilitation | Admitting: Physical Medicine & Rehabilitation

## 2023-02-28 ENCOUNTER — Other Ambulatory Visit: Payer: Self-pay

## 2023-02-28 DIAGNOSIS — I63133 Cerebral infarction due to embolism of bilateral carotid arteries: Secondary | ICD-10-CM

## 2023-02-28 DIAGNOSIS — Z823 Family history of stroke: Secondary | ICD-10-CM | POA: Diagnosis not present

## 2023-02-28 DIAGNOSIS — D649 Anemia, unspecified: Secondary | ICD-10-CM | POA: Diagnosis not present

## 2023-02-28 DIAGNOSIS — I69320 Aphasia following cerebral infarction: Secondary | ICD-10-CM | POA: Diagnosis not present

## 2023-02-28 DIAGNOSIS — Z85048 Personal history of other malignant neoplasm of rectum, rectosigmoid junction, and anus: Secondary | ICD-10-CM

## 2023-02-28 DIAGNOSIS — I129 Hypertensive chronic kidney disease with stage 1 through stage 4 chronic kidney disease, or unspecified chronic kidney disease: Secondary | ICD-10-CM | POA: Diagnosis not present

## 2023-02-28 DIAGNOSIS — I10 Essential (primary) hypertension: Secondary | ICD-10-CM | POA: Diagnosis not present

## 2023-02-28 DIAGNOSIS — Z23 Encounter for immunization: Secondary | ICD-10-CM

## 2023-02-28 DIAGNOSIS — Z87891 Personal history of nicotine dependence: Secondary | ICD-10-CM | POA: Diagnosis not present

## 2023-02-28 DIAGNOSIS — E1122 Type 2 diabetes mellitus with diabetic chronic kidney disease: Secondary | ICD-10-CM | POA: Diagnosis present

## 2023-02-28 DIAGNOSIS — M19011 Primary osteoarthritis, right shoulder: Secondary | ICD-10-CM | POA: Diagnosis present

## 2023-02-28 DIAGNOSIS — Z818 Family history of other mental and behavioral disorders: Secondary | ICD-10-CM | POA: Diagnosis not present

## 2023-02-28 DIAGNOSIS — I639 Cerebral infarction, unspecified: Secondary | ICD-10-CM | POA: Diagnosis not present

## 2023-02-28 DIAGNOSIS — N184 Chronic kidney disease, stage 4 (severe): Secondary | ICD-10-CM | POA: Diagnosis present

## 2023-02-28 DIAGNOSIS — Z8249 Family history of ischemic heart disease and other diseases of the circulatory system: Secondary | ICD-10-CM

## 2023-02-28 DIAGNOSIS — Z79899 Other long term (current) drug therapy: Secondary | ICD-10-CM

## 2023-02-28 DIAGNOSIS — R7989 Other specified abnormal findings of blood chemistry: Secondary | ICD-10-CM | POA: Diagnosis not present

## 2023-02-28 DIAGNOSIS — Z713 Dietary counseling and surveillance: Secondary | ICD-10-CM

## 2023-02-28 DIAGNOSIS — G4733 Obstructive sleep apnea (adult) (pediatric): Secondary | ICD-10-CM | POA: Diagnosis present

## 2023-02-28 DIAGNOSIS — Z79890 Hormone replacement therapy (postmenopausal): Secondary | ICD-10-CM

## 2023-02-28 DIAGNOSIS — Z6836 Body mass index (BMI) 36.0-36.9, adult: Secondary | ICD-10-CM

## 2023-02-28 DIAGNOSIS — I63131 Cerebral infarction due to embolism of right carotid artery: Secondary | ICD-10-CM

## 2023-02-28 DIAGNOSIS — I69351 Hemiplegia and hemiparesis following cerebral infarction affecting right dominant side: Principal | ICD-10-CM

## 2023-02-28 DIAGNOSIS — D631 Anemia in chronic kidney disease: Secondary | ICD-10-CM | POA: Diagnosis not present

## 2023-02-28 DIAGNOSIS — Z7982 Long term (current) use of aspirin: Secondary | ICD-10-CM

## 2023-02-28 DIAGNOSIS — F32A Depression, unspecified: Secondary | ICD-10-CM | POA: Diagnosis present

## 2023-02-28 DIAGNOSIS — E039 Hypothyroidism, unspecified: Secondary | ICD-10-CM | POA: Diagnosis not present

## 2023-02-28 DIAGNOSIS — Z820 Family history of epilepsy and other diseases of the nervous system: Secondary | ICD-10-CM

## 2023-02-28 MED ORDER — POLYETHYLENE GLYCOL 3350 17 G PO PACK
17.0000 g | PACK | Freq: Every day | ORAL | Status: DC | PRN
Start: 2023-02-28 — End: 2023-03-09

## 2023-02-28 MED ORDER — ENOXAPARIN SODIUM 30 MG/0.3ML IJ SOSY
30.0000 mg | PREFILLED_SYRINGE | Freq: Every day | INTRAMUSCULAR | Status: DC
  Administered 2023-03-01 – 2023-03-09 (×9): 30 mg via SUBCUTANEOUS
  Filled 2023-02-28 (×10): qty 0.3

## 2023-02-28 MED ORDER — ONDANSETRON HCL 4 MG PO TABS: 4.0000 mg | ORAL_TABLET | Freq: Four times a day (QID) | ORAL | Status: DC | PRN

## 2023-02-28 MED ORDER — INFLUENZA VAC A&B SURF ANT ADJ 0.5 ML IM SUSY
0.5000 mL | PREFILLED_SYRINGE | INTRAMUSCULAR | Status: AC
  Administered 2023-03-01: 0.5 mL via INTRAMUSCULAR
  Filled 2023-02-28: qty 0.5

## 2023-02-28 MED ORDER — ASPIRIN 81 MG PO CHEW
81.0000 mg | CHEWABLE_TABLET | Freq: Every day | ORAL | Status: DC
  Administered 2023-03-01 – 2023-03-09 (×9): 81 mg via ORAL
  Filled 2023-02-28 (×9): qty 1

## 2023-02-28 MED ORDER — ATORVASTATIN CALCIUM 40 MG PO TABS
40.0000 mg | ORAL_TABLET | Freq: Every day | ORAL | Status: DC
  Administered 2023-03-01 – 2023-03-09 (×9): 40 mg via ORAL
  Filled 2023-02-28 (×9): qty 1

## 2023-02-28 MED ORDER — ENOXAPARIN SODIUM 30 MG/0.3ML IJ SOSY: 30.0000 mg | PREFILLED_SYRINGE | INTRAMUSCULAR | Status: DC

## 2023-02-28 MED ORDER — GUAIFENESIN-DM 100-10 MG/5ML PO SYRP: 10.0000 mL | ORAL_SOLUTION | Freq: Four times a day (QID) | ORAL | Status: DC | PRN

## 2023-02-28 MED ORDER — ALUM & MAG HYDROXIDE-SIMETH 200-200-20 MG/5ML PO SUSP: 30.0000 mL | ORAL | Status: DC | PRN

## 2023-02-28 MED ORDER — FLEET ENEMA RE ENEM: 1.0000 | ENEMA | Freq: Once | RECTAL | Status: DC | PRN

## 2023-02-28 MED ORDER — METHOCARBAMOL 500 MG PO TABS: 500.0000 mg | ORAL_TABLET | Freq: Four times a day (QID) | ORAL | Status: DC | PRN

## 2023-02-28 MED ORDER — BISACODYL 5 MG PO TBEC: 5.0000 mg | DELAYED_RELEASE_TABLET | Freq: Every day | ORAL | Status: DC | PRN

## 2023-02-28 MED ORDER — ACETAMINOPHEN 325 MG PO TABS
325.0000 mg | ORAL_TABLET | ORAL | Status: DC | PRN
  Administered 2023-03-01: 325 mg via ORAL
  Administered 2023-03-09: 650 mg via ORAL
  Filled 2023-02-28 (×2): qty 2

## 2023-02-28 MED ORDER — LEVOTHYROXINE SODIUM 100 MCG PO TABS
100.0000 ug | ORAL_TABLET | Freq: Every day | ORAL | Status: DC
  Administered 2023-03-01 – 2023-03-09 (×9): 100 ug via ORAL
  Filled 2023-02-28 (×9): qty 1

## 2023-02-28 MED ORDER — CLOPIDOGREL BISULFATE 75 MG PO TABS
75.0000 mg | ORAL_TABLET | Freq: Every day | ORAL | Status: DC
  Administered 2023-03-01 – 2023-03-09 (×9): 75 mg via ORAL
  Filled 2023-02-28 (×9): qty 1

## 2023-02-28 MED ORDER — VITAMIN B-12 1000 MCG PO TABS
1000.0000 ug | ORAL_TABLET | Freq: Every day | ORAL | Status: DC
  Administered 2023-03-01 – 2023-03-09 (×9): 1000 ug via ORAL
  Filled 2023-02-28 (×9): qty 1

## 2023-02-28 MED ORDER — ONDANSETRON HCL 4 MG/2ML IJ SOLN
4.0000 mg | Freq: Four times a day (QID) | INTRAMUSCULAR | Status: DC | PRN
Start: 2023-02-28 — End: 2023-03-09

## 2023-02-28 NOTE — Progress Notes (Signed)
Stephania Fragmin, PT Rehab Admission Coordinator Physical Medicine and Rehabilitation   PMR Pre-admission    Signed   Date of Service: 02/28/2023 11:19 AM  Related encounter: ED to Hosp-Admission (Discharged) from 02/22/2023 in [place] [facility]   Signed     Expand All Collapse All  PMR Admission Coordinator Pre-Admission Assessment   Patient: Lynn Ray is an 89 y.o., female MRN: [id number] DOB: 1934-10-08 Height: 4' 11.5" (151.1 cm) Weight: 81.6 kg                                                                                                                                                  Insurance Information HMO:     PPO: yes     PCP:      IPA:      80/20:      OTHER:  PRIMARY: [employer]      Policy#: [id number]       Subscriber: pt CM Name: Dalbert Batman      Phone#: [phone]     Fax#: [phone] Pre-Cert#: [id number] auth for CIR from Dalbert Batman with [employer] with updates due to Livia Snellen on 2/4 at fax listed above (ext [phone])       Employer:  Benefits:  Phone #: [phone]     Name:  Eff. Date: 01/31/23     Deduct: $0      Out of Pocket Max: $4000 ($0 met)      Life Max: n/a  CIR: $160/day for days 1-10      SNF: 20 full days  Outpatient:      Co-Pay: $20/visit Home Health: 100$      Co-Pay:  DME: 80%     Co-Pay: 20% Providers:  SECONDARY:       Policy#:       Phone#:    Artist:       Phone#:    The Engineer, materials Information Summary" for patients in Inpatient Rehabilitation Facilities with attached "Privacy Act Statement-Health Care Records" was provided and verbally reviewed with: Patient and Family   Emergency Contact Information Contact Information       Name Relation Home Work Mobile    Kinnelon Daughter [phone]        [facility] Niece     [phone]         Other Contacts   None on File      Current Medical History  Patient Admitting Diagnosis: CVA    History of Present Illness: Pt is  an 89 y/o female with PMH of HTN, hypothyroidism, CKD IV, chronic anemia, MGUS, and rectal cancer, admitted to [facility] on 02/22/23 with c/o aphasia and facial droop.  In ED vitals and labs reassuring.  MRI completed which showed acute stroke in the right frontal and left thalamic area.  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  Doppler with no significant carotid stenosis.  Stroke team recommended a 30-day event monitor.  Therapy evaluations completed and pt was recommended for CIR.    Complete NIHSS TOTAL: 0 Glasgow Coma Scale Score: 15   Patient's medical record from [facility] has been reviewed by the rehabilitation admission coordinator and physician.   Past Medical History      Past Medical History:  Diagnosis Date   Arthritis     Cancer (HCC) 2004    rectal cancer   Cataract      right eye   Chiari malformation     CKD (chronic kidney disease), stage IV (HCC)     Depression     Diabetes mellitus (HCC)      "pre"   Hypertension     Sleep apnea      no CPAP   Thyroid disease      hyporthyroid          Has the patient had major surgery during 100 days prior to admission? No   Family History  family history includes Alzheimer's disease in her mother; Anemia in an other family member; Depression in an other family member; Heart attack in her mother; Hypertension in her mother; Obesity in an other family member; Stroke in her mother.     Current Medications   Current Medications    Current Facility-Administered Medications:    acetaminophen (TYLENOL) tablet 650 mg, 650 mg, Oral, Q4H PRN **OR** acetaminophen (TYLENOL) 160 MG/5ML solution 650 mg, 650 mg, Per Tube, Q4H PRN **OR** acetaminophen (TYLENOL) suppository 650 mg, 650 mg, Rectal, Q4H PRN, Eduard Clos, MD   aspirin chewable tablet 81 mg, 81 mg, Oral, Daily, Leroy Sea, MD, 81 mg at 02/28/23 0901   atorvastatin (LIPITOR) tablet 40 mg, 40 mg, Oral, Daily, Gevena Mart A, NP, 40 mg at 02/28/23  0901   clopidogrel (PLAVIX) tablet 75 mg, 75 mg, Oral, Daily, Gevena Mart A, NP, 75 mg at 02/28/23 0901   cyanocobalamin (VITAMIN B12) tablet 1,000 mcg, 1,000 mcg, Oral, Daily, Eduard Clos, MD, 1,000 mcg at 02/28/23 0901   enoxaparin (LOVENOX) injection 30 mg, 30 mg, Subcutaneous, Daily, Eduard Clos, MD, 30 mg at 02/28/23 4098   levothyroxine (SYNTHROID) tablet 100 mcg, 100 mcg, Oral, Daily, Eduard Clos, MD, 100 mcg at 02/28/23 0630     Patients Current Diet:  Diet Order                  Diet - low sodium heart healthy             Diet Heart Room service appropriate? Yes; Fluid consistency: Thin  Diet effective now                         Precautions / Restrictions Precautions Precautions: Fall Precaution Comments: watch bp Restrictions Weight Bearing Restrictions Per Provider Order: No    Has the patient had 2 or more falls or a fall with injury in the past year?No   Prior Activity Level Community (5-7x/wk): indep prior to admit without device, was driving to her appointments and managing meds/cooking at baseline   Prior Functional Level Prior Function Prior Level of Function : Independent/Modified Independent, Driving Mobility Comments: was driving ADLs Comments: cooking and cleaning support as needed but will cook for family   Self Care: Did the patient need help bathing, dressing, using the toilet or  eating?  Independent   Indoor Mobility: Did the patient need assistance with walking from room to room (with or without device)? Independent   Stairs: Did the patient need assistance with internal or external stairs (with or without device)? Independent   Functional Cognition: Did the patient need help planning regular tasks such as shopping or remembering to take medications? Independent   Patient Information Are you of Hispanic, Latino/a,or Spanish origin?: A. No, not of Hispanic, Latino/a, or Spanish origin What is your race?: B.  Black or African American Do you need or want an interpreter to communicate with a doctor or health care staff?: 0. No   Patient's Response To:  Health Literacy and Transportation Is the patient able to respond to health literacy and transportation needs?: Yes Health Literacy - How often do you need to have someone help you when you read instructions, pamphlets, or other written material from your doctor or pharmacy?: Never In the past 12 months, has lack of transportation kept you from medical appointments or from getting medications?: No In the past 12 months, has lack of transportation kept you from meetings, work, or from getting things needed for daily living?: No   Journalist, newspaper / Equipment Home Equipment: Shower seat, Medical laboratory scientific officer - single point   Prior Device Use: Indicate devices/aids used by the patient prior to current illness, exacerbation or injury? None of the above   Current Functional Level Cognition   Arousal/Alertness: Awake/alert Overall Cognitive Status: Impaired/Different from baseline Current Attention Level: Sustained Orientation Level: Oriented X4 Following Commands: Follows multi-step commands with increased time Safety/Judgement: Decreased awareness of safety General Comments: Pt noted to increase in ability to scan enviroment and decrease in any cues but more for line management at this time    Extremity Assessment (includes Sensation/Coordination)   Upper Extremity Assessment: RUE deficits/detail RUE Deficits / Details: decrease in FM coordination and strength in RUE RUE Sensation: WNL RUE Coordination: decreased gross motor  Lower Extremity Assessment: Defer to PT evaluation     ADLs   Overall ADL's : Needs assistance/impaired Eating/Feeding: Independent, Sitting Grooming: Wash/dry face, Modified independent, Sitting Upper Body Bathing: Supervision/ safety, Sitting Lower Body Bathing: Contact guard assist, Sit to/from stand, Minimal  assistance Upper Body Dressing : Supervision/safety, Sitting Lower Body Dressing: Contact guard assist, Sit to/from stand Toilet Transfer: Contact guard assist, Cueing for safety, Cueing for sequencing Toileting- Clothing Manipulation and Hygiene: Contact guard assist, Sit to/from stand Functional mobility during ADLs: Contact guard assist, Cueing for safety, Cueing for sequencing     Mobility   Overal bed mobility: Needs Assistance Bed Mobility: Supine to Sit, Sit to Supine Supine to sit: Modified independent (Device/Increase time) Sit to supine: Modified independent (Device/Increase time) General bed mobility comments: increase in time     Transfers   Overall transfer level: Needs assistance Equipment used: Rolling walker (2 wheels), None Transfers: Sit to/from Stand Sit to Stand: Supervision General transfer comment: From recliner with and without UE support with CGA for safety     Ambulation / Gait / Stairs / Wheelchair Mobility   Ambulation/Gait Ambulation/Gait assistance: Contact guard assist, Min assist Gait Distance (Feet): 180 Feet Assistive device: Rolling walker (2 wheels), None Gait Pattern/deviations: Step-through pattern, Decreased stride length, Shuffle, Wide base of support, Drifts right/left General Gait Details: Pt initially using RW with supervision progressing to no AD with CGA for safety. 1 LOB requiring min A to correct. Pt demonstrates increased instability and drifting with head turns and distractions in hallway Gait  velocity: decr     Posture / Balance Dynamic Sitting Balance Sitting balance - Comments: attempted far anterior lean to don socks, able to don with min assist and extra effort Balance Overall balance assessment: Needs assistance Sitting-balance support: Feet supported Sitting balance-Leahy Scale: Good Sitting balance - Comments: attempted far anterior lean to don socks, able to don with min assist and extra effort Standing balance support: No  upper extremity supported Standing balance-Leahy Scale: Fair Standing balance comment: Pt did not attempt to reach out for items to balance self and noted only slight fatigue at the end of session     Special needs/care consideration N/a        Previous Home Environment (from acute therapy documentation) Living Arrangements: Alone  Lives With: Alone (has assist over night) Available Help at Discharge: Family, Available PRN/intermittently Type of Home: House Home Layout: Multi-level Alternate Level Stairs-Rails: Right (going up) Alternate Level Stairs-Number of Steps: 6 (main level to bedroom) Home Access: Stairs to enter Entrance Stairs-Rails: Right Entrance Stairs-Number of Steps: 3 Bathroom Shower/Tub: Tub/shower unit, Buyer, retail:  (unsure)   Discharge Living Setting Plans for Discharge Living Setting: Patient's home Type of Home at Discharge: House Discharge Home Layout: Multi-level Alternate Level Stairs-Rails: Right Alternate Level Stairs-Number of Steps: 6 (to bed/bath) Discharge Home Access: Stairs to enter Entrance Stairs-Rails: Right Entrance Stairs-Number of Steps: 3 through garage Discharge Bathroom Shower/Tub: Tub/shower unit Discharge Bathroom Toilet: Standard Discharge Bathroom Accessibility: Yes How Accessible: Accessible via walker Does the patient have any problems obtaining your medications?: No   Social/Family/Support Systems Anticipated Caregiver: daughter Gunnar Fusi is primary contact Anticipated Caregiver's Contact Information: [phone] Ability/Limitations of Caregiver: supervision Caregiver Availability: 24/7 Discharge Plan Discussed with Primary Caregiver: Yes Is Caregiver In Agreement with Plan?: Yes Does Caregiver/Family have Issues with Lodging/Transportation while Pt is in Rehab?: No     Goals Patient/Family Goal for Rehab: PT/OT mod I, SLP supervision to mod I Expected length of stay: 7-10  days Additional Information: Discharge plan: return to patient's home.  Caregiver overnight, daughter coordinating inital 24/7 supervision Pt/Family Agrees to Admission and willing to participate: Yes Program Orientation Provided & Reviewed with Pt/Caregiver Including Roles  & Responsibilities: Yes     Decrease burden of Care through IP rehab admission: No     Possible need for SNF placement upon discharge: Not anticipated.  Plan to discharge to pt's home with daughter and hired caregivers.  Expect intermittent mod I level at discharge.       Patient Condition: This patient's condition remains as documented in the consult dated 02/26/23, in which the Rehabilitation Physician determined and documented that the patient's condition is appropriate for intensive rehabilitative care in an inpatient rehabilitation facility. Will admit to inpatient rehab today.   Preadmission Screen Completed By:  Stephania Fragmin, PT, 02/28/2023 11:19 AM ______________________________________________________________________   Discussed status with Dr. Carlis Abbott on 02/28/23 at 11:19 AM  and received approval for admission today.   Admission Coordinator:  Stephania Fragmin, time 11:19 AM Dorna Bloom 02/28/23            Cosigned by: Horton Chin, MD at 02/28/2023 11:19 AM   Revision History

## 2023-02-28 NOTE — Plan of Care (Signed)

## 2023-02-28 NOTE — H&P (Incomplete)
Physical Medicine and Rehabilitation Admission H&P   CC: Functional deficits secondary to CVA  HPI: Lynn Ray is an 89 year old female who preented to the ED on 02/22/2023 with left sided weakness and facial droop. MRI brain revealed an acute infarct in the right frontal lobe and left thalamus. Neurology consulted and placed on aspirin and Plavix.  MRA of head and neck performed with no LVO.  No significant carotid artery stenosis.  2D echo revealed ejection fraction by PLAX is 59%.  LDL 84.  Hemoglobin A1c 5.8.  She was started on Lovenox subcutaneously for VTE prophylaxis.  She will continue on DAPT for 3 weeks then aspirin alone.  She is tolerating heart healthy diet with thin liquids.  Past medical history includes depression, hypothyroidism, chronic kidney disease, anemia.  There appears to be history of obstructive sleep apnea.  Pt able to tolerate increased gait distance without AD, however demonstrates increased instability without UE support. The patient requires inpatient medicine and rehabilitation evaluations and services for ongoing dysfunction secondary to CVA.   ROS Past Medical History:  Diagnosis Date   Arthritis    Cancer (HCC) 2004   rectal cancer   Cataract    right eye   Chiari malformation    CKD (chronic kidney disease), stage IV (HCC)    Depression    Diabetes mellitus (HCC)    "pre"   Hypertension    Sleep apnea    no CPAP   Thyroid disease    hyporthyroid   Past Surgical History:  Procedure Laterality Date   CATARACT EXTRACTION Bilateral    chiari malformation  2013   surgery   COLONOSCOPY     FLEXIBLE SIGMOIDOSCOPY     POLYPECTOMY     Family History  Problem Relation Age of Onset   Alzheimer's disease Mother    Stroke Mother        mini   Heart attack Mother    Hypertension Mother    Depression Other    Anemia Other    Obesity Other    Colon cancer Neg Hx    Esophageal cancer Neg Hx    Stomach cancer Neg Hx    Social History:  reports that  she has quit smoking. She has never used smokeless tobacco. She reports that she does not drink alcohol and does not use drugs. Allergies:  Allergies  Allergen Reactions   Aspirin Other (See Comments)    Severe abdominal pain   Tape Rash    adhesive   Medications Prior to Admission  Medication Sig Dispense Refill   Carboxymethylcellulose Sodium ([employer] LUBRICANT EYE DROPS OP) Apply 2 drops to eye 2 (two) times daily as needed.     cholecalciferol (VITAMIN D3) 25 MCG (1000 UT) tablet Take 1,000 Units by mouth daily.     hydrochlorothiazide (HYDRODIURIL) 12.5 MG tablet Take 12.5 mg by mouth daily.     levothyroxine (SYNTHROID) 100 MCG tablet TAKE 1 TABLET (100 MCG TOTAL) BY MOUTH DAILY. FOLLOW-UP DUE IN JUNE WITH LABS 90 tablet 0   metoprolol succinate (TOPROL-XL) 50 MG 24 hr tablet TAKE 1 TABLET BY MOUTH EVERY DAY 90 tablet 3   vitamin B-12 (CYANOCOBALAMIN) 100 MCG tablet Take 100 mcg by mouth daily.     Tiotropium Bromide Monohydrate (SPIRIVA RESPIMAT) 2.5 MCG/ACT AERS Inhale 2 puffs into the lungs daily. (Patient not taking: Reported on 02/23/2023) 4 g 0      Home: Home Living Family/patient expects to be discharged to:: Private residence Living  Arrangements: Alone Available Help at Discharge: Family, Available PRN/intermittently Type of Home: House Home Access: Stairs to enter [employer] of Steps: 3 Entrance Stairs-Rails: Right Home Layout: Multi-level Alternate Level Stairs-Number of Steps: 6 (main level to bedroom) Alternate Level Stairs-Rails: Right (going up) Bathroom Shower/Tub: Tub/shower unit, Engineer, building services: Pharmacist, community:  (unsure) Home Equipment: Shower seat, Medical laboratory scientific officer - single point  Lives With: Alone (has assist over night)   Functional History: Prior Function Prior Level of Function : Independent/Modified Independent, Driving Mobility Comments: was driving ADLs Comments: cooking and cleaning support as needed but will cook for  family  Functional Status:  Mobility: Bed Mobility Overal bed mobility: Needs Assistance Bed Mobility: Supine to Sit, Sit to Supine Supine to sit: Modified independent (Device/Increase time) Sit to supine: Modified independent (Device/Increase time) General bed mobility comments: increase in time Transfers Overall transfer level: Needs assistance Equipment used: Rolling walker (2 wheels), None Transfers: Sit to/from Stand Sit to Stand: Supervision General transfer comment: From recliner with and without UE support with CGA for safety Ambulation/Gait Ambulation/Gait assistance: Contact guard assist, Min assist Gait Distance (Feet): 180 Feet Assistive device: Rolling walker (2 wheels), None Gait Pattern/deviations: Step-through pattern, Decreased stride length, Shuffle, Wide base of support, Drifts right/left General Gait Details: Pt initially using RW with supervision progressing to no AD with CGA for safety. 1 LOB requiring min A to correct. Pt demonstrates increased instability and drifting with head turns and distractions in hallway Gait velocity: decr    ADL: ADL Overall ADL's : Needs assistance/impaired Eating/Feeding: Independent, Sitting Grooming: Wash/dry face, Modified independent, Sitting Upper Body Bathing: Supervision/ safety, Sitting Lower Body Bathing: Contact guard assist, Sit to/from stand, Minimal assistance Upper Body Dressing : Supervision/safety, Sitting Lower Body Dressing: Contact guard assist, Sit to/from stand Toilet Transfer: Contact guard assist, Cueing for safety, Cueing for sequencing Toileting- Clothing Manipulation and Hygiene: Contact guard assist, Sit to/from stand Functional mobility during ADLs: Contact guard assist, Cueing for safety, Cueing for sequencing  Cognition: Cognition Overall Cognitive Status: Impaired/Different from baseline Arousal/Alertness: Awake/alert Orientation Level: Oriented X4 Cognition Arousal: Alert Behavior During  Therapy: WFL for tasks assessed/performed Overall Cognitive Status: Impaired/Different from baseline Area of Impairment: Safety/judgement Current Attention Level: Sustained Memory: Decreased short-term memory, Decreased recall of precautions Following Commands: Follows multi-step commands with increased time Safety/Judgement: Decreased awareness of safety Awareness: Intellectual Problem Solving: Slow processing General Comments: Pt noted to increase in ability to scan enviroment and decrease in any cues but more for line management at this time  Physical Exam: Blood pressure (!) 141/61, pulse 74, temperature 98.1 F (36.7 C), temperature source Oral, resp. rate 18, height 4' 11.5" (1.511 m), weight 81.6 kg, SpO2 100%. Physical Exam  No results found for this or any previous visit (from the past 48 hours). No results found.    Blood pressure (!) 141/61, pulse 74, temperature 98.1 F (36.7 C), temperature source Oral, resp. rate 18, height 4' 11.5" (1.511 m), weight 81.6 kg, SpO2 100%.  Medical Problem List and Plan: 1. Functional deficits secondary to ***  -patient may *** shower  -ELOS/Goals: ***  2.  Antithrombotics: -DVT/anticoagulation:  Pharmaceutical: Lovenox 30 mg daily  -antiplatelet therapy: Aspirin and Plavix for three weeks followed by aspirin alone  3. Pain Management: Tylenol as needed  4. Mood/Behavior/Sleep: LCSW to evaluate and provide emotional support  -antipsychotic agents: n/a  5. Neuropsych/cognition: This patient *** capable of making decisions on *** own behalf.  6. Skin/Wound Care: Routine skin care checks  7. Fluids/Electrolytes/Nutrition: Routine Is and Os and follow-up chemistries  -continue B12 supplement daily  8: Hypertension: monitor TID and prn  9: Hypothyroidism: continue Synthroid  10: CKD: baseline creatinine ~2.0  -follow-up BMP  11: Chronic anemia: H and H stable; follow-up CBC       ***  @Idell Hissong  J Joniece Smotherman,  PA-C 02/28/2023

## 2023-02-28 NOTE — Progress Notes (Signed)
Inpatient Rehab Admissions Coordinator:   I have a bed available for pt to admit today.  Pt/family in agreement and Dr. Thedore Mins aware.  I will let TOC know and make arrangements.   Estill Dooms, PT, DPT Admissions Coordinator [phone] 02/28/23  10:57 AM

## 2023-02-28 NOTE — H&P (Signed)
Physical Medicine and Rehabilitation Admission H&P   CC: Functional deficits secondary to CVA  HPI: Lynn Ray is an 89 year old female who preented to the ED on 02/22/2023 with left sided weakness and facial droop. MRI brain revealed an acute infarct in the right frontal lobe and left thalamus. Neurology consulted and placed on aspirin and Plavix.  MRA of head and neck performed with no LVO.  No significant carotid artery stenosis.  2D echo revealed ejection fraction by PLAX is 59%.  LDL 84.  Hemoglobin A1c 5.8.  She was started on Lovenox subcutaneously for VTE prophylaxis.  She will continue on DAPT for 3 weeks then aspirin alone.  She is tolerating heart healthy diet with thin liquids.  Past medical history includes depression, hypothyroidism, chronic kidney disease, anemia.  There appears to be history of obstructive sleep apnea.  Pt able to tolerate increased gait distance without AD, however demonstrates increased instability without UE support. The patient requires inpatient medicine and rehabilitation evaluations and services for ongoing dysfunction secondary to CVA. Vital signs currently stable.    ROS mild left sided weakness Past Medical History:  Diagnosis Date   Arthritis    Cancer (HCC) 2004   rectal cancer   Cataract    right eye   Chiari malformation    CKD (chronic kidney disease), stage IV (HCC)    Depression    Diabetes mellitus (HCC)    "pre"   Hypertension    Sleep apnea    no CPAP   Thyroid disease    hyporthyroid   Past Surgical History:  Procedure Laterality Date   CATARACT EXTRACTION Bilateral    chiari malformation  2013   surgery   COLONOSCOPY     FLEXIBLE SIGMOIDOSCOPY     POLYPECTOMY     Family History  Problem Relation Age of Onset   Alzheimer's disease Mother    Stroke Mother        mini   Heart attack Mother    Hypertension Mother    Depression Other    Anemia Other    Obesity Other    Colon cancer Neg Hx    Esophageal cancer Neg Hx     Stomach cancer Neg Hx    Social History:  reports that she has quit smoking. She has never used smokeless tobacco. She reports that she does not drink alcohol and does not use drugs. Allergies:  Allergies  Allergen Reactions   Aspirin Other (See Comments)    Severe abdominal pain   Tape Rash    adhesive   Medications Prior to Admission  Medication Sig Dispense Refill   aspirin EC 81 MG tablet Take 1 tablet (81 mg total) by mouth daily. 30 tablet 3   atorvastatin (LIPITOR) 40 MG tablet Take 1 tablet (40 mg total) by mouth daily. 30 tablet 0   Carboxymethylcellulose Sodium ([employer] LUBRICANT EYE DROPS OP) Apply 2 drops to eye 2 (two) times daily as needed.     cholecalciferol (VITAMIN D3) 25 MCG (1000 UT) tablet Take 1,000 Units by mouth daily.     clopidogrel (PLAVIX) 75 MG tablet Take 1 tablet (75 mg total) by mouth daily. 18 tablet 0   hydrochlorothiazide (HYDRODIURIL) 12.5 MG tablet Take 12.5 mg by mouth daily.     levothyroxine (SYNTHROID) 100 MCG tablet TAKE 1 TABLET (100 MCG TOTAL) BY MOUTH DAILY. FOLLOW-UP DUE IN JUNE WITH LABS 90 tablet 0   metoprolol succinate (TOPROL-XL) 50 MG 24 hr tablet TAKE 1 TABLET  BY MOUTH EVERY DAY 90 tablet 3   vitamin B-12 (CYANOCOBALAMIN) 100 MCG tablet Take 100 mcg by mouth daily.      Home: Home Living Family/patient expects to be discharged to:: Private residence Living Arrangements: Alone Available Help at Discharge: Family, Available PRN/intermittently Type of Home: House Home Access: Stairs to enter Secretary/administrator of Steps: 3 Entrance Stairs-Rails: Right Home Layout: Multi-level Alternate Level Stairs-Number of Steps: 6 (main level to bedroom) Alternate Level Stairs-Rails: Right (going up) Bathroom Shower/Tub: Tub/shower unit, Engineer, building services: Pharmacist, community:  (unsure) Home Equipment: Shower seat, Medical laboratory scientific officer - single point  Lives With: Alone (has assist over night)   Functional History: Prior Function Prior  Level of Function : Independent/Modified Independent, Driving Mobility Comments: was driving ADLs Comments: cooking and cleaning support as needed but will cook for family   Functional Status:  Mobility: Bed Mobility Overal bed mobility: Needs Assistance Bed Mobility: Supine to Sit, Sit to Supine Supine to sit: Modified independent (Device/Increase time) Sit to supine: Modified independent (Device/Increase time) General bed mobility comments: increase in time Transfers Overall transfer level: Needs assistance Equipment used: Rolling walker (2 wheels), None Transfers: Sit to/from Stand Sit to Stand: Supervision General transfer comment: From recliner with and without UE support with CGA for safety Ambulation/Gait Ambulation/Gait assistance: Contact guard assist, Min assist Gait Distance (Feet): 180 Feet Assistive device: Rolling walker (2 wheels), None Gait Pattern/deviations: Step-through pattern, Decreased stride length, Shuffle, Wide base of support, Drifts right/left General Gait Details: Pt initially using RW with supervision progressing to no AD with CGA for safety. 1 LOB requiring min A to correct. Pt demonstrates increased instability and drifting with head turns and distractions in hallway Gait velocity: decr   ADL: ADL Overall ADL's : Needs assistance/impaired Eating/Feeding: Independent, Sitting Grooming: Wash/dry face, Modified independent, Sitting Upper Body Bathing: Supervision/ safety, Sitting Lower Body Bathing: Contact guard assist, Sit to/from stand, Minimal assistance Upper Body Dressing : Supervision/safety, Sitting Lower Body Dressing: Contact guard assist, Sit to/from stand Toilet Transfer: Contact guard assist, Cueing for safety, Cueing for sequencing Toileting- Clothing Manipulation and Hygiene: Contact guard assist, Sit to/from stand Functional mobility during ADLs: Contact guard assist, Cueing for safety, Cueing for sequencing    Cognition: Cognition Overall Cognitive Status: Impaired/Different from baseline Arousal/Alertness: Awake/alert Orientation Level: Oriented X4 Cognition Arousal: Alert Behavior During Therapy: WFL for tasks assessed/performed Overall Cognitive Status: Impaired/Different from baseline Area of Impairment: Safety/judgement Current Attention Level: Sustained Memory: Decreased short-term memory, Decreased recall of precautions Following Commands: Follows multi-step commands with increased time Safety/Judgement: Decreased awareness of safety Awareness: Intellectual Problem Solving: Slow processing General Comments: Pt noted to increase in ability to scan enviroment and decrease in any cues but more for line management at this time      Physical Exam: Blood pressure 137/64, pulse 78, temperature 98.2 F (36.8 C), temperature source Oral, resp. rate 18, height 4' 11.5" (1.511 m), weight 81.6 kg, SpO2 100%. Physical Exam Gen: no distress, normal appearing HEENT: oral mucosa pink and moist, NCAT Cardio: Reg rate Chest: normal effort, normal rate of breathing Abd: soft, non-distended Ext: no edema Psych: pleasant, normal affect Skin: intact Neuro: Left sided mild weakness 4/5, no sensory deficits  No results found for this or any previous visit (from the past 48 hours). No results found.    Blood pressure 137/64, pulse 78, temperature 98.2 F (36.8 C), temperature source Oral, resp. rate 18, height 4' 11.5" (1.511 m), weight 81.6 kg, SpO2 100%.  Medical Problem List  and Plan: 1. Functional deficits secondary to acute right frontal and left thalamic CVA  -patient may shower  -ELOS/Goals: 5-7 days modI  Admit to CIR  2.  Antithrombotics: -DVT/anticoagulation:  Pharmaceutical: Lovenox 30 mg daily  -antiplatelet therapy: continue Aspirin and Plavix for three weeks followed by aspirin alone  3. Pain Management: Tylenol as needed  4. Mood/Behavior/Sleep: LCSW to evaluate and  provide emotional support  -antipsychotic agents: n/a  5. Neuropsych/cognition: This patient is capable of making decisions on her own behalf.  6. Skin/Wound Care: Routine skin care checks   7. Fluids/Electrolytes/Nutrition: Routine Is and Os and follow-up chemistries  -continue B12 supplement daily  8: Hypertension: monitor TID and prn  9: Hypothyroidism: continue Synthroid  10: CKD: baseline creatinine ~2.0  -follow-up BMP  11: Chronic anemia: H and H stable; follow-up CBC  12. Morbid obesity: provide dietary education   I have personally performed a face to face diagnostic evaluation, including, but not limited to relevant history and physical exam findings, of this patient and developed relevant assessment and plan.  Additionally, I have reviewed and concur with the physician assistant's documentation above.  Wendi Maya, PA  [email or web address]  Cristino Martes, MD 02/28/2023

## 2023-02-28 NOTE — Progress Notes (Signed)
[facility]                                                                                                                                                                         Patient Demographics  Lynn Ray, is a 89 y.o. female  ZOX:[id number]  [id number]  DOB - August 10, 1934  Admit date - 02/22/2023  Admitting Physician Eduard Clos, MD  Outpatient Primary MD for the patient is [name], MD  LOS - 5   Chief Complaint  Patient presents with   Aphasia   Facial Droop        Assessment & Plan    Patient seen briefly today due for discharge soon per Discharge done yesterday by me, no further issues, Vital signs stable, patient feels fine.  Await CIR bed, if not available family and patient want to go home today.    Medications  Scheduled Meds:  aspirin  81 mg Oral Daily   atorvastatin  40 mg Oral Daily   clopidogrel  75 mg Oral Daily   cyanocobalamin  1,000 mcg Oral Daily   enoxaparin (LOVENOX) injection  30 mg Subcutaneous Daily   levothyroxine  100 mcg Oral Daily   Continuous Infusions: PRN Meds:.acetaminophen **OR** acetaminophen (TYLENOL) oral liquid 160 mg/5 mL **OR** acetaminophen    Time Spent in minutes   10 minutes   Susa Raring M.D on 02/28/2023 at 9:37 AM  Between 7am to 7pm - Pager - [phone]  After 7pm go to [email or web address] - password [email or web address]  And look for the night coverage person covering for me after hours  [facility] Office  [phone]    Subjective:   Lynn Ray today has, No headache, No chest pain, No abdominal pain - No Nausea, No new weakness tingling or numbness, No Cough - SOB.    Objective:   Vitals:   02/27/23 2044 02/28/23 0016 02/28/23 0430 02/28/23 0500  BP: 129/85 (!) 126/51 133/61   Pulse: 72 69 81   Resp: (!) 21 13 17    Temp: 98.1 F (36.7 C) 97.9 F (36.6 C) 98.1 F (36.7 C)   TempSrc: Oral Oral  Oral   SpO2: 99% 100% 100%   Weight:    81.6 kg  Height:        Wt Readings from Last 3 Encounters:  02/28/23 81.6 kg  10/17/22 79.4 kg  07/21/22 79.8 kg    No intake or output data in the 24 hours ending 02/28/23 0937  Exam  Awake Alert, No new F.N deficits, Normal affect [place].AT,PERRAL Supple Neck, No JVD,   Symmetrical Chest wall movement, Good air movement bilaterally, CTAB RRR,No Gallops, Rubs or new Murmurs,  +ve B.Sounds, Abd Soft, No tenderness,  No Cyanosis, Clubbing or edema   Data Review

## 2023-02-28 NOTE — Progress Notes (Signed)
Lynn Oyster, MD  Physician Physical Medicine and Rehabilitation   Consult Note    Signed   Date of Service: 02/26/2023 11:29 AM  Related encounter: ED to Hosp-Admission (Discharged) from 02/22/2023 in [facility]   Signed     Expand All Collapse All           Physical Medicine and Rehabilitation Consult Reason for Consult:impaired functional mobility and language Referring Physician: Thedore Mins     HPI: Lynn Ray is a 89 y.o. female with a history of hypertension, stage IV chronic kidney disease, rectal cancer who presented on 02/22/2023 with word finding deficits over the course of the day.  She had some left-sided weakness and facial droop upon presentation.  MRI the brain revealed an acute infarct in the right frontal and left thalamic areas.  Neurology saw the patient and recommended aspirin and Plavix for 3 weeks and then just aspirin alone.  30-day event monitor was recommended upon discharge.  Permissive hypertension allowed given acute stroke.  Patient with anemia felt to be chronic due to her kidney disease.  Patient has been up with therapy and thus far has required min assist to contact-guard assist for sit to stand transfers and has walked 20 feet using 1 person hand-held assistance.  Wide base of support is noted.  She was seen by Occupational Therapy yesterday and was contact-guard assist for transfers and contact-guard to min assist with basic ADL's patient lives at home alone in a multilevel home with 3 steps to enter she has family you are available to assist intermittently     Review of Systems  Constitutional: Negative.   HENT: Negative.    Eyes: Negative.   Respiratory: Negative.    Cardiovascular: Negative.   Gastrointestinal: Negative.   Genitourinary: Negative.   Musculoskeletal: Negative.   Skin: Negative.   Neurological:  Positive for focal weakness.  Psychiatric/Behavioral:  Negative for depression.         Past Medical  History:  Diagnosis Date   Arthritis     Cancer [facility]) 2004    rectal cancer   Cataract      right eye   Chiari malformation     CKD (chronic kidney disease), stage IV (HCC)     Depression     Diabetes mellitus (HCC)      "pre"   Hypertension     Sleep apnea      no CPAP   Thyroid disease      hyporthyroid             Past Surgical History:  Procedure Laterality Date   CATARACT EXTRACTION Bilateral     chiari malformation   2013    surgery   COLONOSCOPY       FLEXIBLE SIGMOIDOSCOPY       POLYPECTOMY                 Family History  Problem Relation Age of Onset   Alzheimer's disease Mother     Stroke Mother          mini   Heart attack Mother     Hypertension Mother     Depression Other     Anemia Other     Obesity Other     Colon cancer Neg Hx     Esophageal cancer Neg Hx     Stomach cancer Neg Hx          Social History:  reports that  she has quit smoking. She has never used smokeless tobacco. She reports that she does not drink alcohol and does not use drugs. Allergies:  Allergies       Allergies  Allergen Reactions   Aspirin Other (See Comments)      Severe abdominal pain   Tape Rash      adhesive            Medications Prior to Admission  Medication Sig Dispense Refill   Carboxymethylcellulose Sodium ([employer] LUBRICANT EYE DROPS OP) Apply 2 drops to eye 2 (two) times daily as needed.       cholecalciferol (VITAMIN D3) 25 MCG (1000 UT) tablet Take 1,000 Units by mouth daily.       hydrochlorothiazide (HYDRODIURIL) 12.5 MG tablet Take 12.5 mg by mouth daily.       levothyroxine (SYNTHROID) 100 MCG tablet TAKE 1 TABLET (100 MCG TOTAL) BY MOUTH DAILY. FOLLOW-UP DUE IN JUNE WITH LABS 90 tablet 0   metoprolol succinate (TOPROL-XL) 50 MG 24 hr tablet TAKE 1 TABLET BY MOUTH EVERY DAY 90 tablet 3   vitamin B-12 (CYANOCOBALAMIN) 100 MCG tablet Take 100 mcg by mouth daily.       Tiotropium Bromide Monohydrate (SPIRIVA RESPIMAT) 2.5 MCG/ACT AERS Inhale 2 puffs  into the lungs daily. (Patient not taking: Reported on 02/23/2023) 4 g 0          Home: Home Living Family/patient expects to be discharged to:: Private residence Living Arrangements: Alone Available Help at Discharge: Family, Available PRN/intermittently Type of Home: House Home Access: Stairs to enter Secretary/administrator of Steps: 3 Entrance Stairs-Rails: Right Home Layout: Multi-level Alternate Level Stairs-Number of Steps: 6 (main level to bedroom) Alternate Level Stairs-Rails: Right (going up) Bathroom Shower/Tub: Tub/shower unit, Engineer, building services: Pharmacist, community:  (unsure) Home Equipment: Shower seat, Medical laboratory scientific officer - single point  Lives With: Alone (has assist over night)  Functional History: Prior Function Prior Level of Function : Independent/Modified Independent, Driving Mobility Comments: was driving ADLs Comments: cooking and cleaning support as needed but will cook for family Functional Status:  Mobility: Bed Mobility Overal bed mobility: Needs Assistance Bed Mobility: Supine to Sit Supine to sit: Supervision Sit to supine: Contact guard assist, HOB elevated General bed mobility comments: Pt presented in chair Transfers Overall transfer level: Needs assistance Equipment used: Rolling walker (2 wheels) Transfers: Sit to/from Stand Sit to Stand: Supervision, Contact guard assist General transfer comment: close CGA for safety with rise, HHA provided with min assist for steadying balance on standing. Ambulation/Gait Ambulation/Gait assistance: Min assist Gait Distance (Feet): 20 Feet Assistive device: 1 person hand held assist Gait Pattern/deviations: Step-through pattern, Decreased stride length, Decreased dorsiflexion - right, Decreased dorsiflexion - left, Shuffle, Wide base of support General Gait Details: HHA required to steady balance, cues for direction and pt getting distracted by external stimuli in hallway. Gait velocity: decr    ADL: ADL Overall ADL's : Needs assistance/impaired Eating/Feeding: Independent, Sitting Grooming: Wash/dry hands, Wash/dry face, Oral care, Contact guard assist, Standing Upper Body Bathing: Contact guard assist, Sitting Lower Body Bathing: Minimal assistance, Sit to/from stand Upper Body Dressing : Contact guard assist, Sitting Lower Body Dressing: Minimal assistance, Sit to/from stand Toilet Transfer: Contact guard assist, Rolling walker (2 wheels) Toileting- Clothing Manipulation and Hygiene: Contact guard assist, Sit to/from stand Functional mobility during ADLs: Minimal assistance, Rolling walker (2 wheels)   Cognition: Cognition Overall Cognitive Status: Impaired/Different from baseline Arousal/Alertness: Awake/alert Orientation Level: Oriented X4 Cognition Arousal: Alert Behavior During Therapy:  WFL for tasks assessed/performed Overall Cognitive Status: Impaired/Different from baseline Area of Impairment: Problem solving Current Attention Level: Sustained Memory: Decreased short-term memory, Decreased recall of precautions Following Commands: Follows one step commands consistently Safety/Judgement: Decreased awareness of safety, Decreased awareness of deficits Awareness: Intellectual Problem Solving: Slow processing, Decreased initiation, Difficulty sequencing, Requires verbal cues, Requires tactile cues General Comments: Per family reported to have significant increase in ability to complete tasks   Blood pressure (!) 145/34, pulse 68, temperature 98.2 F (36.8 C), resp. rate 18, height 4' 11.5" (1.511 m), weight 83.3 kg, SpO2 97%. Physical Exam Constitutional:      General: She is not in acute distress.    Appearance: She is not ill-appearing.  HENT:     Head: Normocephalic.     Right Ear: There is no impacted cerumen.     Left Ear: There is no impacted cerumen.     Nose: Nose normal.  Eyes:     Pupils: Pupils are equal, round, and reactive to light.   Cardiovascular:     Rate and Rhythm: Normal rate.  Pulmonary:     Effort: Pulmonary effort is normal.  Abdominal:     Palpations: Abdomen is soft.  Musculoskeletal:        General: No swelling or tenderness. Normal range of motion.     Cervical back: Normal range of motion.  Skin:    General: Skin is warm and dry.  Neurological:     Mental Status: She is alert.     Comments: Alert and oriented x 3. Normal insight and awareness. Intact Memory. Non-fluent language but comprehension intact. Speaks in short phrases and words. Clear speech. Cranial nerve exam unremarkable. I didn't appreciate any facial asymmetry.  MMT: RUE 4/5 prox to distal. LUE 4- to 4/5. RLE 4/5 prox to idstal. LLE 4- to 4/5 prox to distal. No focal sensory loss. Decreased FMC with FTN, HTS. Did not stand her. Seems to appreciate LT and pain in all 4's. No abnl resting tone. DTR's 1+         Lab Results Last 24 Hours  No results found for this or any previous visit (from the past 24 hours).   Imaging Results (Last 48 hours)  No results found.     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 Does the need for close, 24 hr/day medical supervision in concert with the patient's rehab needs make it unreasonable for this patient to be served in a less intensive setting? Yes Co-Morbidities requiring supervision/potential complications:  -Dysphagia  -hypertension -Chronic kidney disease stage IV -Chronic anemia -MGUS Due to bladder management, bowel management, safety, skin/wound care, disease management, medication administration, pain management, and patient education, does the patient require 24 hr/day rehab nursing? Yes Does the patient require coordinated care of a physician, rehab nurse, therapy disciplines of PT, OT, SLP to address physical and functional deficits in the context of the above medical diagnosis(es)?  Yes Addressing deficits in the following areas: balance, endurance, locomotion, strength, transferring, bowel/bladder control, bathing, dressing, feeding, grooming, toileting, cognition, language, swallowing, and psychosocial support Can the patient actively participate in an intensive therapy program of at least 3 hrs of therapy per day at least 5 days per week? Yes The potential for patient to make measurable gains while on inpatient rehab is excellent Anticipated functional outcomes upon discharge from inpatient rehab are modified independent  with PT, modified independent with OT, modified independent and  supervision with SLP. Estimated rehab length of stay to reach the above functional goals is: 7-10 days Anticipated discharge destination: Home Overall Rehab/Functional Prognosis: excellent   POST ACUTE RECOMMENDATIONS: This patient's condition is appropriate for continued rehabilitative care in the following setting: CIR Patient has agreed to participate in recommended program. Yes Note that insurance prior authorization may be required for reimbursement for recommended care.   Comment: Pt lives in a split level home. Dwells on upper two levels. Pt is motivated to regain functional mobility. Good family supports. Rehab Admissions Coordinator to follow up.         I have personally performed a face to face diagnostic evaluation of this patient. Additionally, I have examined the patient's medical record including any pertinent labs and radiographic images. If the physician assistant has documented in this note, I have reviewed and edited or otherwise concur with the physician assistant's documentation.   Thanks,   Lynn Oyster, MD 02/26/2023

## 2023-02-28 NOTE — Progress Notes (Signed)
Inpatient Rehabilitation Admission Medication Review by a Pharmacist  A complete drug regimen review was completed for this patient to identify any potential clinically significant medication issues.  High Risk Drug Classes Is patient taking? Indication by Medication  Antipsychotic No   Anticoagulant Yes Lovenox - DVT prophylaxis  Antibiotic No   Opioid No   Antiplatelet Yes Aspirin & Plavix - stroke  Hypoglycemics/insulin No   Vasoactive Medication No   Chemotherapy Yes, Oral Chemotherapy   Other Yes Atorvastatin - HLD B12 - supplement Synthroid - hypothyroidism Robaxin - muscle spasms     Type of Medication Issue Identified Description of Issue Recommendation(s)  Drug Interaction(s) (clinically significant)     Duplicate Therapy     Allergy     No Medication Administration End Date     Incorrect Dose     Additional Drug Therapy Needed  Metoprolol and hydrochlorthiazide on PTA. Vitamin D3 on PTA and in acute stay Would restart as needed  Would restart if appropriate  Significant med changes from prior encounter (inform family/care partners about these prior to discharge).    Other       Clinically significant medication issues were identified that warrant physician communication and completion of prescribed/recommended actions by midnight of the next day:  No  Name of provider notified for urgent issues identified:   Provider Method of Notification:     Pharmacist comments:   Time spent performing this drug regimen review (minutes):  15   Jeanella Cara, PharmD, [place] Clinical Pharmacist Please see AMION for all Pharmacists' Contact Phone Numbers 02/28/2023, 1:49 PM

## 2023-03-01 DIAGNOSIS — I63133 Cerebral infarction due to embolism of bilateral carotid arteries: Secondary | ICD-10-CM | POA: Diagnosis not present

## 2023-03-01 LAB — CBC WITH DIFFERENTIAL/PLATELET
Abs Immature Granulocytes: 0.01 10*3/uL (ref 0.00–0.07)
Basophils Absolute: 0.1 10*3/uL (ref 0.0–0.1)
Basophils Relative: 1 %
Eosinophils Absolute: 0.4 10*3/uL (ref 0.0–0.5)
Eosinophils Relative: 6 %
HCT: 34.8 % — ABNORMAL LOW (ref 36.0–46.0)
Hemoglobin: 11.1 g/dL — ABNORMAL LOW (ref 12.0–15.0)
Immature Granulocytes: 0 %
Lymphocytes Relative: 31 %
Lymphs Abs: 1.9 10*3/uL (ref 0.7–4.0)
MCH: 28.2 pg (ref 26.0–34.0)
MCHC: 31.9 g/dL (ref 30.0–36.0)
MCV: 88.3 fL (ref 80.0–100.0)
Monocytes Absolute: 0.5 10*3/uL (ref 0.1–1.0)
Monocytes Relative: 9 %
Neutro Abs: 3.3 10*3/uL (ref 1.7–7.7)
Neutrophils Relative %: 53 %
Platelets: 253 10*3/uL (ref 150–400)
RBC: 3.94 MIL/uL (ref 3.87–5.11)
RDW: 13.2 % (ref 11.5–15.5)
WBC: 6.1 10*3/uL (ref 4.0–10.5)
nRBC: 0 % (ref 0.0–0.2)

## 2023-03-01 LAB — COMPREHENSIVE METABOLIC PANEL
ALT: 11 U/L (ref 0–44)
AST: 15 U/L (ref 15–41)
Albumin: 3.2 g/dL — ABNORMAL LOW (ref 3.5–5.0)
Alkaline Phosphatase: 89 U/L (ref 38–126)
Anion gap: 7 (ref 5–15)
BUN: 26 mg/dL — ABNORMAL HIGH (ref 8–23)
CO2: 24 mmol/L (ref 22–32)
Calcium: 9.9 mg/dL (ref 8.9–10.3)
Chloride: 107 mmol/L (ref 98–111)
Creatinine, Ser: 2.2 mg/dL — ABNORMAL HIGH (ref 0.44–1.00)
GFR, Estimated: 21 mL/min — ABNORMAL LOW (ref >60)
Glucose, Bld: 87 mg/dL (ref 70–99)
Potassium: 4.9 mmol/L (ref 3.5–5.1)
Sodium: 138 mmol/L (ref 135–145)
Total Bilirubin: 0.4 mg/dL (ref 0.0–1.2)
Total Protein: 7 g/dL (ref 6.5–8.1)

## 2023-03-01 NOTE — Evaluation (Signed)
Speech Language Pathology Assessment and Plan  Patient Details  Name: Lynn Ray MRN: [id number] Date of Birth: [birth year]  SLP Diagnosis: Cognitive Impairments;Aphasia  Rehab Potential: Excellent ELOS: 7-10 days    Today's Date: 03/01/2023 SLP Individual Time: 4098-1191 SLP Individual Time Calculation (min): 57 min   Hospital Problem: Principal Problem:   CVA (cerebral vascular accident) [name])  Past Medical History:  Past Medical History:  Diagnosis Date   Arthritis    Cancer (HCC) 2004   rectal cancer   Cataract    right eye   Chiari malformation    CKD (chronic kidney disease), stage IV (HCC)    Depression    Diabetes mellitus (HCC)    "pre"   Hypertension    Sleep apnea    no CPAP   Thyroid disease    hyporthyroid   Past Surgical History:  Past Surgical History:  Procedure Laterality Date   CATARACT EXTRACTION Bilateral    chiari malformation  2013   surgery   COLONOSCOPY     FLEXIBLE SIGMOIDOSCOPY     POLYPECTOMY      Assessment / Plan / Recommendation Clinical Impression HPI: Lynn Ray is an 89 year old female who preented to the ED on 02/22/2023 with left sided weakness and facial droop. MRI brain revealed an acute infarct in the right frontal lobe and left thalamus. Neurology consulted and placed on aspirin and Plavix. MRA of head and neck performed with no LVO. No significant carotid artery stenosis. 2D echo revealed ejection fraction by PLAX is 59%. LDL 84. Hemoglobin A1c 5.8. She was started on Lovenox subcutaneously for VTE prophylaxis. She will continue on DAPT for 3 weeks then aspirin alone. She is tolerating heart healthy diet with thin liquids. Past medical history includes depression, hypothyroidism, chronic kidney disease, anemia.   Clinical Impression:  Patient was assessed via the Cognistat to assess cognitive lingustic functioning. Patient scored WFL on all subtests with the exception of mild deficits in memory, judgement and calculations.  These scores indicate impairments in memory and higher level problem solving. Occasional word finding difficulties present, however receptive language [facility]. Patients awareness and attention seemingly intact as patient independently verbalized changes since admission and attended to ST entire session. Recommend targeting memory and higher level problem solving skills during admission due to patients prior level of cognitive independence. Patient was 100% intelligibile at the conversational level and reports no difficulty swallowing. Pt would benefit from skilled ST services to maximize communication and cognition in order to maximize functional independence at d/c. Anticipate patient will require 24 hour supervision at d/c and f/u SLP services.    Skilled Therapeutic Interventions          Patient evaluated using a non-standardized cognitive linguistic assessment and bedside swallow evaluation to assess current cognitive, communicative and swallowing function. See above for details.    SLP Assessment  Patient will need skilled Speech Lanaguage Pathology Services during CIR admission    Recommendations  Patient destination: Home Follow up Recommendations: None Equipment Recommended: None recommended by SLP    SLP Frequency 3 to 5 out of 7 days   SLP Duration  SLP Intensity  SLP Treatment/Interventions 7-10 days  Minumum of 1-2 x/day, 30 to 90 minutes  Cognitive remediation/compensation;Internal/external aids;Speech/Language facilitation;Cueing hierarchy;Therapeutic Activities;Functional tasks;Multimodal communication approach;Patient/family education    Pain None reported  SLP Evaluation Cognition Overall Cognitive Status: Impaired/Different from baseline Arousal/Alertness: Awake/alert Orientation Level: Oriented X4 Year: 2025 Month: January Day of Week: Correct Attention: Sustained Sustained Attention: Appears  intact Memory: Impaired Memory Impairment: Decreased short term  memory Awareness: Appears intact Problem Solving: Impaired Problem Solving Impairment: Verbal complex;Functional complex Safety/Judgment: Appears intact  Comprehension Auditory Comprehension Overall Auditory Comprehension: Appears within functional limits for tasks assessed Expression Expression Primary Mode of Expression: Verbal Verbal Expression Overall Verbal Expression: Impaired Initiation: No impairment Level of Generative/Spontaneous Verbalization: Conversation Repetition: No impairment Naming: Impairment Confrontation: Impaired Pragmatics: No impairment Effective Techniques: Semantic cues;Phonemic cues;Sentence completion Written Expression Dominant Hand: Right Oral Motor Oral Motor/Sensory Function Overall Oral Motor/Sensory Function: Within functional limits Motor Speech Overall Motor Speech: Appears within functional limits for tasks assessed  Care Tool Care Tool Cognition Ability to hear (with hearing aid or hearing appliances if normally used Ability to hear (with hearing aid or hearing appliances if normally used): 1. Minimal difficulty - difficulty in some environments (e.g. when person speaks softly or setting is noisy)   Expression of Ideas and Wants Expression of Ideas and Wants: 3. Some difficulty - exhibits some difficulty with expressing needs and ideas (e.g, some words or finishing thoughts) or speech is not clear   Understanding Verbal and Non-Verbal Content Understanding Verbal and Non-Verbal Content: 4. Understands (complex and basic) - clear comprehension without cues or repetitions  Memory/Recall Ability Memory/Recall Ability : Current season;That he or she is in a hospital/hospital unit   Short Term Goals: Week 1: SLP Short Term Goal 1 (Week 1): STG = LTG due to ELOS  Refer to Care Plan for Long Term Goals  Recommendations for other services: None   Discharge Criteria: Patient will be discharged from SLP if patient refuses treatment 3 consecutive  times without medical reason, if treatment goals not met, if there is a change in medical status, if patient makes no progress towards goals or if patient is discharged from hospital.  The above assessment, treatment plan, treatment alternatives and goals were discussed and mutually agreed upon: by patient  Rieley Khalsa M.A., CF-SLP 03/01/2023, 12:20 PM

## 2023-03-01 NOTE — Progress Notes (Signed)
Inpatient Rehabilitation Center Individual Statement of Services  Patient Name:  Lynn Ray  Date:  03/01/2023  Welcome to the Inpatient Rehabilitation Center.  Our goal is to provide you with an individualized program based on your diagnosis and situation, designed to meet your specific needs.  With this comprehensive rehabilitation program, you will be expected to participate in at least 3 hours of rehabilitation therapies Monday-Friday, with modified therapy programming on the weekends.  Your rehabilitation program will include the following services:  Physical Therapy (PT), Occupational Therapy (OT), Speech Therapy (ST), 24 hour per day rehabilitation nursing, Neuropsychology, Care Coordinator, Rehabilitation Medicine, Nutrition Services, and Pharmacy Services  Weekly team conferences will be held on wednesday to discuss your progress.  Your Inpatient Rehabilitation Care Coordinator will talk with you frequently to get your input and to update you on team discussions.  Team conferences with you and your family in attendance may also be held.  Expected length of stay: 7-10 days  Overall anticipated outcome: Independent with device  Depending on your progress and recovery, your program may change. Your Inpatient Rehabilitation Care Coordinator will coordinate services and will keep you informed of any changes. Your Inpatient Rehabilitation Care Coordinator's name and contact numbers are listed  below.  The following services may also be recommended but are not provided by the Inpatient Rehabilitation Center:  Driving Evaluations Home Health Rehabiltiation Services Outpatient Rehabilitation Services    Arrangements will be made to provide these services after discharge if needed.  Arrangements include referral to agencies that provide these services.  Your insurance has been verified to be:  [employer] Your primary doctor is:  Hillard Danker  Pertinent information will be shared  with your doctor and your insurance company.  Inpatient Rehabilitation Care Coordinator:  Dossie Der, [place] [phone] or Luna Glasgow  Information discussed with and copy given to patient by: Lucy Chris, 03/01/2023, 10:37 AM

## 2023-03-01 NOTE — Progress Notes (Signed)
Inpatient Rehabilitation  Patient information reviewed and entered into eRehab system by Cheri Rous, OTR/L, Rehab Quality Coordinator.   Information including medical coding, functional ability and quality indicators will be reviewed and updated through discharge.

## 2023-03-01 NOTE — Plan of Care (Signed)
  Problem: RH Expression Communication Goal: LTG Patient will increase word finding of common (SLP) Description: LTG:  Patient will increase word finding of common objects/daily info/abstract thoughts with cues using compensatory strategies (SLP). Flowsheets (Taken 03/01/2023 1219) LTG: Patient will increase word finding of common (SLP): Supervision Patient will use compensatory strategies to increase word finding of: Abstract thoughts   Problem: RH Problem Solving Goal: LTG Patient will demonstrate problem solving for (SLP) Description: LTG:  Patient will demonstrate problem solving for basic/complex daily situations with cues  (SLP) Flowsheets (Taken 03/01/2023 1219) LTG: Patient will demonstrate problem solving for (SLP): (mildly complex) Other (comment) LTG Patient will demonstrate problem solving for: Supervision   Problem: RH Memory Goal: LTG Patient will use memory compensatory aids to (SLP) Description: LTG:  Patient will use memory compensatory aids to recall biographical/new, daily complex information with cues (SLP) Flowsheets (Taken 03/01/2023 1219) LTG: Patient will use memory compensatory aids to (SLP): Supervision

## 2023-03-01 NOTE — Evaluation (Signed)
Occupational Therapy Assessment and Plan  Patient Details  Name: Lynn Ray MRN: [id number] Date of Birth: 1934-05-25  OT Diagnosis: abnormal posture, acute pain, and muscle weakness (generalized) Rehab Potential: Rehab Potential (ACUTE ONLY): Excellent ELOS: 7-10 days   Today's Date: 03/01/2023 OT Individual Time: 0850-1000 OT Individual Time Calculation (min): 70 min     Hospital Problem: Principal Problem:   CVA (cerebral vascular accident) [facility])   Past Medical History:  Past Medical History:  Diagnosis Date   Arthritis    Cancer (HCC) 2004   rectal cancer   Cataract    right eye   Chiari malformation    CKD (chronic kidney disease), stage IV (HCC)    Depression    Diabetes mellitus (HCC)    "pre"   Hypertension    Sleep apnea    no CPAP   Thyroid disease    hyporthyroid   Past Surgical History:  Past Surgical History:  Procedure Laterality Date   CATARACT EXTRACTION Bilateral    chiari malformation  2013   surgery   COLONOSCOPY     FLEXIBLE SIGMOIDOSCOPY     POLYPECTOMY      Assessment & Plan Clinical Impression:  Lynn Ray is an 89 year old female who preented to the ED on 02/22/2023 with left sided weakness and facial droop. MRI brain revealed an acute infarct in the right frontal lobe and left thalamus. Neurology consulted and placed on aspirin and Plavix.  MRA of head and neck performed with no LVO.  No significant carotid artery stenosis.  2D echo revealed ejection fraction by PLAX is 59%.  LDL 84.  Hemoglobin A1c 5.8.  She was started on Lovenox subcutaneously for VTE prophylaxis.  She will continue on DAPT for 3 weeks then aspirin alone.  She is tolerating heart healthy diet with thin liquids.  Past medical history includes depression, hypothyroidism, chronic kidney disease, anemia.  There appears to be history of obstructive sleep apnea.  Pt able to tolerate increased gait distance without AD, however demonstrates increased instability without UE support.  The patient requires inpatient medicine and rehabilitation evaluations and services for ongoing dysfunction secondary to CVA. Vital signs currently stable. Patient transferred to CIR on 02/28/2023 .    Patient currently requires min with basic self-care skills and IADL secondary to muscle weakness, decreased cardiorespiratoy endurance, decreased coordination, decreased sequencing, and decreased standing balance and decreased balance strategies.  Prior to hospitalization, patient could complete self-care independently.  Patient will benefit from skilled intervention to decrease level of assist with basic self-care skills, increase independence with basic self-care skills, and increase level of independence with iADL prior to discharge home independently.  Anticipate patient will require follow up outpatient.  OT - End of Session Activity Tolerance: Tolerates 10 - 20 min activity with multiple rests Endurance Deficit: Yes Endurance Deficit Description: mild SOB after ADLs at shower level OT Assessment Rehab Potential (ACUTE ONLY): Excellent OT Barriers to Discharge: Home environment access/layout;Inaccessible home environment;Decreased caregiver support OT Patient demonstrates impairments in the following area(s): Balance;Endurance;Motor;Edema OT Basic ADL's Functional Problem(s): Bathing;Dressing;Toileting OT Advanced ADL's Functional Problem(s): Simple Meal Preparation OT Transfers Functional Problem(s): Toilet;Tub/Shower OT Additional Impairment(s): None OT Plan OT Intensity: Minimum of 1-2 x/day, 45 to 90 minutes OT Frequency: 5 out of 7 days OT Duration/Estimated Length of Stay: 7-10 days OT Treatment/Interventions: Balance/vestibular training;Discharge planning;Functional electrical stimulation;Pain management;Self Care/advanced ADL retraining;Therapeutic Activities;UE/LE Coordination activities;Disease mangement/prevention;Functional mobility training;Patient/family education;Therapeutic  Exercise;DME/adaptive equipment instruction;Neuromuscular re-education;UE/LE Strength taining/ROM OT Self Feeding Anticipated Outcome(s):  Independent OT Basic Self-Care Anticipated Outcome(s): Mod I OT Toileting Anticipated Outcome(s): Mod I OT Bathroom Transfers Anticipated Outcome(s): Mod I OT Recommendation Recommendations for Other Services: Therapeutic Recreation consult Therapeutic Recreation Interventions: Pet therapy;Stress management;Other (comment) (crafts) Patient destination: Home Follow Up Recommendations: Outpatient OT Equipment Recommended: To be determined   OT Evaluation Precautions/Restrictions  Precautions Precautions: Fall Precaution Comments: mild R hemiparesis, HOH Restrictions Weight Bearing Restrictions Per Provider Order: No Home Living/Prior Functioning Home Living Family/patient expects to be discharged to:: Private residence Living Arrangements: Alone Available Help at Discharge: Family, Available PRN/intermittently (daughter mainly; daughter reports having 2 cousins who could assist but not comitted for regular assist) Type of Home: House Home Access: Stairs to enter Secretary/administrator of Steps: 3 Entrance Stairs-Rails: Right Home Layout: Multi-level, Bed/bath upstairs Alternate Level Stairs-Number of Steps: 1 flight Alternate Level Stairs-Rails: Right Bathroom Shower/Tub: Tub/shower unit, Engineer, building services: Standard Bathroom Accessibility: Yes Additional Comments: Has shower seat and SPC  Lives With: Alone (has hired Comptroller or family member during the night) IADL History Homemaking Responsibilities: Yes Meal Prep Responsibility: Primary Current License: Yes Mode of Transportation: Set designer Occupation: Retired 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 Leisure and Hobbies: Everything crafts Prior Function Level of Independence: Independent with basic ADLs, Independent with transfers, Independent with homemaking with  ambulation, Independent with gait  Able to Take Stairs?: Yes Driving: Yes Vocation: Retired Administrator, sports Baseline Vision/History: 1 Wears glasses Ability to See in Adequate Light: 1 Impaired Patient Visual Report: No change from baseline Vision Assessment?: Yes Eye Alignment: Within Functional Limits Ocular Range of Motion: Restricted on the right Alignment/Gaze Preference: Within Defined Limits Tracking/Visual Pursuits: Decreased smoothness of horizontal tracking Saccades: Additional head turns occurred during testing Convergence: Impaired (comment) Visual Fields: No apparent deficits Perception  Perception: Within Functional Limits Praxis Praxis: Impaired Praxis Impairment Details: Initiation Cognition Cognition Overall Cognitive Status: Within Functional Limits for tasks assessed Arousal/Alertness: Awake/alert Orientation Level: Person;Place;Situation Person: Oriented Place: Oriented Situation: Oriented Memory: Appears intact Awareness: Appears intact Problem Solving: Appears intact Safety/Judgment: Appears intact Brief Interview for Mental Status (BIMS) Repetition of Three Words (First Attempt): 3 Temporal Orientation: Year: Correct Temporal Orientation: Month: Accurate within 5 days Temporal Orientation: Day: Correct Recall: "Sock": Yes, no cue required Recall: "Blue": Yes, no cue required Recall: "Bed": Yes, after cueing ("a piece of furniture") BIMS Summary Score: 14 Sensation Sensation Light Touch: Appears Intact Hot/Cold: Not tested Proprioception: Appears Intact Stereognosis: Not tested Additional Comments: Pt reports numbness in Rt hand however intact light touch with formal assessment Coordination Gross Motor Movements are Fluid and Coordinated: No Fine Motor Movements are Fluid and Coordinated: Yes Coordination and Movement Description: GM coordination deficit related to Rt sided weakness Finger Nose Finger Test: [facility] Motor  Motor Motor: Other  (comment) Motor - Skilled Clinical Observations: mild R hemiparesis  Trunk/Postural Assessment  Cervical Assessment Cervical Assessment: Exceptions to [facility] (forward head) Thoracic Assessment Thoracic Assessment: Exceptions to [facility] (rounded shoulders) Lumbar Assessment Lumbar Assessment: Exceptions to [facility] (posterior pelvic tilt in sitting) Postural Control Postural Control: Deficits on evaluation Righting Reactions: delayed on R  Balance Balance Balance Assessed: Yes Static Sitting Balance Static Sitting - Balance Support: Feet supported;No upper extremity supported Static Sitting - Level of Assistance: 7: Independent Dynamic Sitting Balance Dynamic Sitting - Balance Support: Feet supported;No upper extremity supported Dynamic Sitting - Level of Assistance: 5: Stand by assistance (supervision) Static Standing Balance Static Standing - Balance Support: During functional activity;No upper extremity supported Static Standing -  Level of Assistance: 5: Stand by assistance (supervision) Dynamic Standing Balance Dynamic Standing - Balance Support: During functional activity;No upper extremity supported Dynamic Standing - Level of Assistance: 5: Stand by assistance (CGA) Dynamic Standing - Comments: No AD Extremity/Trunk Assessment RUE Assessment RUE Assessment: Exceptions to [facility] Active Range of Motion (AROM) Comments: Shoulder flexion limited to ~100 degrees; WFL distally General Strength Comments: 3+/5 proximally, 4-/5 distally LUE Assessment LUE Assessment: Exceptions to [facility] Active Range of Motion (AROM) Comments: WFL General Strength Comments: 4-/5 grossly  Care Tool Care Tool Self Care Eating   Eating Assist Level: Set up assist    Oral Care    Oral Care Assist Level: Set up assist    Bathing   Body parts bathed by patient: Right arm;Left arm;Chest;Abdomen;Front perineal area;Buttocks;Right upper leg;Left upper leg;Right lower leg;Left lower leg;Face     Assist Level:  Contact Guard/Touching assist    Upper Body Dressing(including orthotics)   What is the patient wearing?: Pull over shirt;Bra   Assist Level: Minimal Assistance - Patient > 75%    Lower Body Dressing (excluding footwear)   What is the patient wearing?: Incontinence brief;Pants Assist for lower body dressing: Minimal Assistance - Patient > 75%    Putting on/Taking off footwear   What is the patient wearing?: Socks;Shoes Assist for footwear: Total Assistance - Patient < 25%       Care Tool Toileting Toileting activity   Assist for toileting: Minimal Assistance - Patient > 75%     Care Tool Bed Mobility Roll left and right activity        Sit to lying activity        Lying to sitting on side of bed activity   Lying to sitting on side of bed assist level: the ability to move from lying on the back to sitting on the side of the bed with no back support.: Supervision/Verbal cueing     Care Tool Transfers Sit to stand transfer   Sit to stand assist level: Contact Guard/Touching assist    Chair/bed transfer         Toilet transfer   Assist Level: Contact Guard/Touching assist     Care Tool Cognition  Expression of Ideas and Wants Expression of Ideas and Wants: 4. Without difficulty (complex and basic) - expresses complex messages without difficulty and with speech that is clear and easy to understand  Understanding Verbal and Non-Verbal Content Understanding Verbal and Non-Verbal Content: 3. Usually understands - understands most conversations, but misses some part/intent of message. Requires cues at times to understand   Memory/Recall Ability Memory/Recall Ability : Current season;That he or she is in a hospital/hospital unit   Refer to Care Plan for Long Term Goals  SHORT TERM GOAL WEEK 1 OT Short Term Goal 1 (Week 1): STG = LTG due to ELOS  Recommendations for other services: Therapeutic Recreation  Pet therapy, Stress management, and Other crafts    Skilled  Therapeutic Intervention Patient received upright in bed upon therapy arrival and agreeable to participate in OT evaluation. Education provided on OT purpose, therapy schedule, goals for therapy, and safety policy while in rehab. No pain reported. Daughter present to verify DC plans.  Patient demonstrates mild Rt hemiparesis, GM coordination, functional endurance dynamic balance and sequencing deficits resulting in difficulty completing BADL tasks without increased physical assist. Cues needed throughout for sequencing throughout but pt with appropriate safety awareness and problem solving appearing cognitively intact and per daughter, pt is at baseline. Pt will  benefit from skilled OT services to focus on mentioned deficits. See below for ADL and functional transfer performance. Bathing completed at shower level, dressing on BSC, and pt with continent urinary void during session on toilet. Pt remained seated in recliner at conclusion of session with chair alarm on and all needs met at end of session.   ADL ADL Eating: Set up Where Assessed-Eating: Edge of bed Grooming: Setup Where Assessed-Grooming: Edge of bed Upper Body Bathing: Supervision/safety Where Assessed-Upper Body Bathing: Shower Lower Body Bathing: Contact guard Where Assessed-Lower Body Bathing: Shower Upper Body Dressing: Minimal assistance Where Assessed-Upper Body Dressing: Other (Comment) (BSC) Lower Body Dressing: Minimal assistance Where Assessed-Lower Body Dressing: Other (Comment) (BSC) Toileting: Minimal assistance Where Assessed-Toileting: IT consultant Method: Proofreader: Raised toilet seat Tub/Shower Transfer: Unable to assess Tub/Shower Transfer Method: Unable to assess Film/video editor: Administrator, arts Method: Designer, industrial/product: Information systems manager with back;Grab bars Mobility  Bed Mobility Bed Mobility:  Supine to Sit Supine to Sit: Supervision/Verbal cueing Transfers Sit to Stand: Contact Guard/Touching assist   Discharge Criteria: Patient will be discharged from OT if patient refuses treatment 3 consecutive times without medical reason, if treatment goals not met, if there is a change in medical status, if patient makes no progress towards goals or if patient is discharged from hospital.  The above assessment, treatment plan, treatment alternatives and goals were discussed and mutually agreed upon: by patient and by family  Melvyn Novas, MS, OTR/L  03/01/2023, 12:01 PM

## 2023-03-01 NOTE — Discharge Summary (Signed)
 Physician Discharge Summary  Patient ID: Lynn Ray MRN: [id number] DOB/AGE: May 28, 1934 89 y.o.  Admit date: 02/28/2023 Discharge date: 03/09/2023  Discharge Diagnoses:  Principal Problem:   CVA (cerebral vascular accident) [facility]) Active Problems:   Stage 4 chronic kidney disease (HCC) Active problems: Functional deficits secondary to CVA Hypertension Hypothyroidism CKD Chronic anemia Morbid obesity Right glenohumeral OA  Discharged Condition: good  Significant Diagnostic Studies: none  Labs:  Basic Metabolic Panel: Recent Labs  Lab 03/05/23 0522 03/07/23 0516 03/08/23 0530  NA 136 136 139  K 4.8 4.8 4.7  CL 109 109 115*  CO2 22 19* 18*  GLUCOSE 95 87 91  BUN 37* 37* 34*  CREATININE 2.48* 2.33* 2.26*  CALCIUM  9.8 9.6 9.5    CBC: Recent Labs  Lab 03/05/23 0522  WBC 5.8  HGB 10.0*  HCT 31.4*  MCV 88.0  PLT 252    Brief HPI:   Lynn Ray is a 89 y.o. female presented to the ED on 02/22/2023 with left sided weakness and facial droop. MRI brain revealed an acute infarct in the right frontal lobe and left thalamus. Neurology consulted and placed on aspirin  and Plavix . MRA of head and neck performed with no LVO. No significant carotid artery stenosis. 2D echo revealed ejection fraction by PLAX is 59%. LDL 84. Hemoglobin A1c 5.8. She was started on Lovenox  subcutaneously for VTE prophylaxis. She will continue on DAPT for 3 weeks then aspirin  alone. She is tolerating heart healthy diet with thin liquids. Past medical history includes depression, hypothyroidism, chronic kidney disease, anemia. There appears to be history of obstructive sleep apnea. Pt able to tolerate increased gait distance without AD, however demonstrates increased instability without UE support.    Hospital Course: Lynn Ray was admitted to rehab 02/28/2023 for inpatient therapies to consist of PT, ST and OT at least three hours five days a week. Past admission physiatrist, therapy team and rehab RN  have worked together to provide customized collaborative inpatient rehab. Serum creatinine on 1/30 up to 2.20, BUN 26. PO fluids encouraged. Hgb up to 11.1. Severe glenohumeral osteoarthritis noted...will consider repeat steroid injection if this interferes with OT/PT. Repeat BMP on 2/03>>BUN 37 and creatinine 2.48. Started normal saline IVF at 75 cc/hr. 2/03 hemoglobin a little lower at 10.0, appears overall stable , may be dilutional with IVF.   Blood pressures were monitored on TID basis and home anti-hypertensives held.   Rehab course: During patient's stay in rehab weekly team conferences were held to monitor patient's progress, set goals and discuss barriers to discharge. At admission, patient required min with basic self-care skills and IADL and min with mobility.   She has had improvement in activity tolerance, balance, postural control as well as ability to compensate for deficits. She has had improvement in functional use RUE/LUE  and RLE/LLE as well as improvement in awareness. Patient has met 9 of 9 long term goals due to improved activity tolerance, improved balance, increased strength, functional use of  right upper extremity and right lower extremity, improved attention, and improved awareness.  Patient to discharge at an ambulatory level modified independent/ Supervision.   Patient's care partner is independent to provide the necessary physical supervision assistance at discharge.   Home health PT and OT arranged through [facility]  Discharge disposition: 06-Home-Health Care Svc     Diet: Hearth healthy  Special Instructions: No driving, alcohol consumption or tobacco use.  30 day heart monitor arranged.  Discharge Instructions  Ambulatory referral to Neurology   Complete by: As directed    An appointment is requested in approximately: 4 weeks   Ambulatory referral to Physical Medicine Rehab   Complete by: As directed    Hospital follow-up appointment   Discharge  patient   Complete by: As directed    Discharge disposition: 06-Home-Health Care Svc   Discharge patient date: 03/08/2023      Allergies as of 03/09/2023       Reactions   Aspirin  Other (See Comments)   Severe abdominal pain   Tape Rash   adhesive        Medication List     STOP taking these medications    hydrochlorothiazide  12.5 MG tablet Commonly known as: HYDRODIURIL    metoprolol  succinate 50 MG 24 hr tablet Commonly known as: TOPROL -XL       TAKE these medications    acetaminophen  325 MG tablet Commonly known as: TYLENOL  Take 1-2 tablets (325-650 mg total) by mouth every 4 (four) hours as needed for mild pain (pain score 1-3).   aspirin  EC 81 MG tablet Take 1 tablet (81 mg total) by mouth daily.   atorvastatin  40 MG tablet Commonly known as: LIPITOR Take 1 tablet (40 mg total) by mouth daily.   cholecalciferol 25 MCG (1000 UNIT) tablet Commonly known as: VITAMIN D3 Take 1,000 Units by mouth daily.   clopidogrel  75 MG tablet Commonly known as: PLAVIX  Take 1 tablet (75 mg total) by mouth daily for 6 days. Start taking on: March 10, 2023   [employer] LUBRICANT EYE DROPS OP Apply 2 drops to eye 2 (two) times daily as needed.   cyanocobalamin  100 MCG tablet Commonly known as: VITAMIN B12 Take 100 mcg by mouth daily.   levothyroxine  100 MCG tablet Commonly known as: SYNTHROID  TAKE 1 TABLET (100 MCG TOTAL) BY MOUTH DAILY. FOLLOW-UP DUE IN [place] WITH LABS        Follow-up Information     Rollene Almarie LABOR, MD Follow up.   Specialty: Internal Medicine Why: Call the office in 1-2 days to make arrangements for hospital follow-up appointment. Contact information: [address] [place] [address] [phone]         Carilyn Prentice BRAVO, MD Follow up.   Specialty: Physical Medicine and Rehabilitation Why: office will call you to arrange your appt (sent) Contact information: [address] [place]  [address] [phone]         [facility] Follow up.   Why: Call the office in 1-2 days to make arrangements for hospital follow-up appointment. Contact information: [address]     [address] [place]  [address] [phone]                Signed: Nena JINNY Buba, PA-C 03/09/2023, 3:48 PM

## 2023-03-01 NOTE — Plan of Care (Signed)
  Problem: RH Balance Goal: LTG Patient will maintain dynamic standing with ADLs (OT) Description: LTG:  Patient will maintain dynamic standing balance with assist during activities of daily living (OT)  Flowsheets (Taken 03/01/2023 1210) LTG: Pt will maintain dynamic standing balance during ADLs with: Independent with assistive device   Problem: Sit to Stand Goal: LTG:  Patient will perform sit to stand in prep for activites of daily living with assistance level (OT) Description: LTG:  Patient will perform sit to stand in prep for activites of daily living with assistance level (OT) Flowsheets (Taken 03/01/2023 1210) LTG: PT will perform sit to stand in prep for activites of daily living with assistance level: Independent with assistive device   Problem: RH Bathing Goal: LTG Patient will bathe all body parts with assist levels (OT) Description: LTG: Patient will bathe all body parts with assist levels (OT) Flowsheets (Taken 03/01/2023 1210) LTG: Pt will perform bathing with assistance level/cueing: Independent with assistive device    Problem: RH Dressing Goal: LTG Patient will perform upper body dressing (OT) Description: LTG Patient will perform upper body dressing with assist, with/without cues (OT). Flowsheets (Taken 03/01/2023 1210) LTG: Pt will perform upper body dressing with assistance level of: Independent with assistive device Goal: LTG Patient will perform lower body dressing w/assist (OT) Description: LTG: Patient will perform lower body dressing with assist, with/without cues in positioning using equipment (OT) Flowsheets (Taken 03/01/2023 1210) LTG: Pt will perform lower body dressing with assistance level of: Independent with assistive device   Problem: RH Toileting Goal: LTG Patient will perform toileting task (3/3 steps) with assistance level (OT) Description: LTG: Patient will perform toileting task (3/3 steps) with assistance level (OT)  Flowsheets (Taken 03/01/2023  1210) LTG: Pt will perform toileting task (3/3 steps) with assistance level: Independent with assistive device   Problem: RH Simple Meal Prep Goal: LTG Patient will perform simple meal prep w/assist (OT) Description: LTG: Patient will perform simple meal prep with assistance, with/without cues (OT). Flowsheets (Taken 03/01/2023 1210) LTG: Pt will perform simple meal prep with assistance level of: Independent with assistive device   Problem: RH Toilet Transfers Goal: LTG Patient will perform toilet transfers w/assist (OT) Description: LTG: Patient will perform toilet transfers with assist, with/without cues using equipment (OT) Flowsheets (Taken 03/01/2023 1210) LTG: Pt will perform toilet transfers with assistance level of: Independent with assistive device   Problem: RH Tub/Shower Transfers Goal: LTG Patient will perform tub/shower transfers w/assist (OT) Description: LTG: Patient will perform tub/shower transfers with assist, with/without cues using equipment (OT) Flowsheets (Taken 03/01/2023 1210) LTG: Pt will perform tub/shower stall transfers with assistance level of: Independent with assistive device

## 2023-03-01 NOTE — Plan of Care (Signed)
  Problem: RH Balance Goal: LTG Patient will maintain dynamic sitting balance (PT) Description: LTG:  Patient will maintain dynamic sitting balance with assistance during mobility activities (PT) Flowsheets (Taken 03/01/2023 1808) LTG: Pt will maintain dynamic sitting balance during mobility activities with:: Independent with assistive device  Goal: LTG Patient will maintain dynamic standing balance (PT) Description: LTG:  Patient will maintain dynamic standing balance with assistance during mobility activities (PT) Flowsheets (Taken 03/01/2023 1808) LTG: Pt will maintain dynamic standing balance during mobility activities with:: Independent with assistive device    Problem: Sit to Stand Goal: LTG:  Patient will perform sit to stand with assistance level (PT) Description: LTG:  Patient will perform sit to stand with assistance level (PT) Flowsheets (Taken 03/01/2023 1808) LTG: PT will perform sit to stand in preparation for functional mobility with assistance level: Independent with assistive device   Problem: RH Bed Mobility Goal: LTG Patient will perform bed mobility with assist (PT) Description: LTG: Patient will perform bed mobility with assistance, with/without cues (PT). Flowsheets (Taken 03/01/2023 1808) LTG: Pt will perform bed mobility with assistance level of: Independent with assistive device    Problem: RH Bed to Chair Transfers Goal: LTG Patient will perform bed/chair transfers w/assist (PT) Description: LTG: Patient will perform bed to chair transfers with assistance (PT). Flowsheets (Taken 03/01/2023 1808) LTG: Pt will perform Bed to Chair Transfers with assistance level: Independent with assistive device    Problem: RH Car Transfers Goal: LTG Patient will perform car transfers with assist (PT) Description: LTG: Patient will perform car transfers with assistance (PT). Flowsheets (Taken 03/01/2023 1808) LTG: Pt will perform car transfers with assist:: Supervision/Verbal  cueing   Problem: RH Ambulation Goal: LTG Patient will ambulate in controlled environment (PT) Description: LTG: Patient will ambulate in a controlled environment, # of feet with assistance (PT). Flowsheets (Taken 03/01/2023 1808) LTG: Pt will ambulate in controlled environ  assist needed:: Independent with assistive device LTG: Ambulation distance in controlled environment: 188ft using LRAD Goal: LTG Patient will ambulate in home environment (PT) Description: LTG: Patient will ambulate in home environment, # of feet with assistance (PT). Flowsheets (Taken 03/01/2023 1808) LTG: Pt will ambulate in home environ  assist needed:: Independent with assistive device LTG: Ambulation distance in home environment: >23ft using LRAD   Problem: RH Stairs Goal: LTG Patient will ambulate up and down stairs w/assist (PT) Description: LTG: Patient will ambulate up and down # of stairs with assistance (PT) Flowsheets (Taken 03/01/2023 1808) LTG: Pt will ambulate up/down stairs assist needed:: Supervision/Verbal cueing LTG: Pt will  ambulate up and down number of stairs: at least 6 steps using 1 HR per home set-up

## 2023-03-01 NOTE — Progress Notes (Signed)
Inpatient Rehabilitation Care Coordinator Assessment and Plan Patient Details  Name: Lynn Ray MRN: [id number] Date of Birth: [birth year]  Today's Date: 03/01/2023  Hospital Problems: Principal Problem:   CVA (cerebral vascular accident) [facility])  Past Medical History:  Past Medical History:  Diagnosis Date   Arthritis    Cancer (HCC) 2004   rectal cancer   Cataract    right eye   Chiari malformation    CKD (chronic kidney disease), stage IV (HCC)    Depression    Diabetes mellitus (HCC)    "pre"   Hypertension    Sleep apnea    no CPAP   Thyroid disease    hyporthyroid   Past Surgical History:  Past Surgical History:  Procedure Laterality Date   CATARACT EXTRACTION Bilateral    chiari malformation  2013   surgery   COLONOSCOPY     FLEXIBLE SIGMOIDOSCOPY     POLYPECTOMY     Social History:  reports that she has quit smoking. She has never used smokeless tobacco. She reports that she does not drink alcohol and does not use drugs.  Family / Support Systems Marital Status: Widow/Widower Patient Roles: Parent, Other (Comment) (retiree) Children: Paula-daughter [phone] Son in [place] Other Supports: Alicia-niece [phone] Anticipated Caregiver: Daughter is with pt Thurs-Sat then has someone stay with everynight from 8pm-6 am Ability/Limitations of Caregiver: If neede will needed will come up with a plan, currently does not have someone during the day Sun-Wed Caregiver Availability: Other (Comment) (is alone Sun-Wed during the day) Family Dynamics: Close with children daughter is out of town but comes Th-Sun and spends time with her. Her son lives in [place]. She is close with her church family and friends.  Social History Preferred language: English Religion: [employer] Cultural Background: No issues Education: HS Health Literacy - How often do you need to have someone help you when you read instructions, pamphlets, or other written material from your doctor or  pharmacy?: Never Writes: Yes Employment Status: Retired Marine scientist Issues: No issues Guardian/Conservator: None-according to MD pt is capable of making her own decisions while here. Will include daughter due to she is very involved with her Mom   Abuse/Neglect Abuse/Neglect Assessment Can Be Completed: Yes Physical Abuse: Denies Verbal Abuse: Denies Sexual Abuse: Denies Exploitation of patient/patient's resources: Denies Self-Neglect: Denies  Patient response to: Social Isolation - How often do you feel lonely or isolated from those around you?: Rarely  Emotional Status Pt's affect, behavior and adjustment status: Pt is motivated to recover and regain her independence she is not one to sit still and wants to do for herself. She was driving prior to admission and knows this may not happen but wants to take care of herself. Recent Psychosocial Issues: other health issues Psychiatric History: Hx-depression takes medications and feels it helps. May benefit from seeing neuro-psych while here Substance Abuse History: NA  Patient / Family Perceptions, Expectations & Goals Pt/Family understanding of illness & functional limitations: Pt and daughter seem to have a good understanding of her CVA and deficits. Both talk with the MD and feel understand her plan moving forward. Premorbid pt/family roles/activities: mom, grandmother, retiree, church member, etc Anticipated changes in roles/activities/participation: resume Pt/family expectations/goals: Pt states: " I hope to be able to do for myself when I leave here."  Daughter states: " I hope she does well I may need to arrange someone to be with her at home."  [employer]: Other (Comment) (has someone at  night 8pm-6 am) Premorbid Home Care/DME Agencies: Other (Comment) (cane and tub seat) Transportation available at discharge: self Is the patient able to respond to transportation needs?: Yes In the  past 12 months, has lack of transportation kept you from medical appointments or from getting medications?: No In the past 12 months, has lack of transportation kept you from meetings, work, or from getting things needed for daily living?: No Resource referrals recommended: Neuropsychology  Discharge Planning Living Arrangements: Alone Support Systems: Children, Other relatives, Psychologist, clinical community, Other (Comment) (caregiver at night) Type of Residence: Private residence Insurance Resources: Media planner (specify) Multimedia programmer Medicare) Financial Resources: Restaurant manager, fast food Screen Referred: No Living Expenses: Own Money Management: Patient, Family Does the patient have any problems obtaining your medications?: No Home Management: self Patient/Family Preliminary Plans: Return home with daughter assisting-daughter waiting to see how she does to make other arrangements if needed for 24/7 since she does not currently have. Will await therapy evaluations anf goals being set Care Coordinator Barriers to Discharge: Insurance for SNF coverage, Decreased caregiver support, Lack of/limited family support Care Coordinator Anticipated Follow Up Needs: HH/OP  Clinical Impression Pleasant female who was very independent prior to admission was still driving. Both daughter who is present hope she will do well here and not need 24/7 care at discharge. Await team's evaluations  Lucy Chris 03/01/2023, 10:36 AM

## 2023-03-01 NOTE — Evaluation (Signed)
Physical Therapy Assessment and Plan  Patient Details  Name: Lynn Ray MRN: [id number] Date of Birth: April 01, 1934  PT Diagnosis: Abnormal posture, Abnormality of gait, Cognitive deficits, Hemiparesis dominant, and Muscle weakness Rehab Potential: Excellent ELOS: 7-10 days   Today's Date: 03/01/2023 PT Individual Time: 1324-4010 PT Individual Time Calculation (min): 71 min    Hospital Problem: Principal Problem:   CVA (cerebral vascular accident) [facility])   Past Medical History:  Past Medical History:  Diagnosis Date   Arthritis    Cancer (HCC) 2004   rectal cancer   Cataract    right eye   Chiari malformation    CKD (chronic kidney disease), stage IV (HCC)    Depression    Diabetes mellitus (HCC)    "pre"   Hypertension    Sleep apnea    no CPAP   Thyroid disease    hyporthyroid   Past Surgical History:  Past Surgical History:  Procedure Laterality Date   CATARACT EXTRACTION Bilateral    chiari malformation  2013   surgery   COLONOSCOPY     FLEXIBLE SIGMOIDOSCOPY     POLYPECTOMY      Assessment & Plan Clinical Impression: Patient is a 89 y.o. year old female who presented to the ED on 02/22/2023 with left sided weakness and facial droop. MRI brain revealed an acute infarct in the right frontal lobe and left thalamus. Neurology consulted and placed on aspirin and Plavix.  MRA of head and neck performed with no LVO.  No significant carotid artery stenosis.  2D echo revealed ejection fraction by PLAX is 59%.  LDL 84.  Hemoglobin A1c 5.8.  She was started on Lovenox subcutaneously for VTE prophylaxis.  She will continue on DAPT for 3 weeks then aspirin alone.  She is tolerating heart healthy diet with thin liquids.  Past medical history includes depression, hypothyroidism, chronic kidney disease, anemia.  There appears to be history of obstructive sleep apnea.  Pt able to tolerate increased gait distance without AD, however demonstrates increased instability without UE  support. The patient requires inpatient medicine and rehabilitation evaluations and services for ongoing dysfunction secondary to CVA. Vital signs currently stable. Patient transferred to CIR on 02/28/2023 .   Patient currently requires min with mobility secondary to muscle weakness, decreased cardiorespiratoy endurance, impaired timing and sequencing, decreased visual motor skills, decreased initiation and decreased memory, and decreased standing balance, decreased postural control, and decreased balance strategies.  Prior to hospitalization, patient was independent  with mobility and lived with Alone (has hired companions or daughter stays with her at night; but alone during the day) in a House home.  Home access is 3Stairs to enter.  Patient will benefit from skilled PT intervention to maximize safe functional mobility, minimize fall risk, and decrease caregiver burden for planned discharge home with intermittent assist.  Anticipate patient will benefit from follow up [facility] at discharge.  PT - End of Session Activity Tolerance: Tolerates 30+ min activity with multiple rests Endurance Deficit: Yes Endurance Deficit Description: fatigue after gait training PT Assessment Rehab Potential (ACUTE/IP ONLY): Excellent PT Barriers to Discharge: Decreased caregiver support;Home environment access/layout PT Patient demonstrates impairments in the following area(s): Balance;Endurance;Motor;Safety PT Transfers Functional Problem(s): Bed Mobility;Bed to Chair;Car;Furniture;Floor PT Locomotion Functional Problem(s): Ambulation;Stairs PT Plan PT Intensity: Minimum of 1-2 x/day ,45 to 90 minutes PT Frequency: 5 out of 7 days PT Duration Estimated Length of Stay: 7-10 days PT Treatment/Interventions: Ambulation/gait training;Community reintegration;DME/adaptive equipment instruction;Neuromuscular re-education;Psychosocial support;Stair training;UE/LE Strength taining/ROM;Balance/vestibular training;Discharge  planning;Pain management;Skin care/wound management;Therapeutic Activities;UE/LE Coordination activities;Cognitive remediation/compensation;Disease management/prevention;Functional mobility training;Patient/family education;Therapeutic Exercise;Visual/perceptual remediation/compensation PT Transfers Anticipated Outcome(s): mod-I using LRAD PT Locomotion Anticipated Outcome(s): mod-I using LRAD household level PT Recommendation Recommendations for Other Services: Therapeutic Recreation consult Therapeutic Recreation Interventions: Kitchen group Follow Up Recommendations: Home health PT;24 hour supervision/assistance Patient destination: Home Equipment Recommended: To be determined   PT Evaluation Precautions/Restrictions Precautions Precautions: Fall;Other (comment) Precaution Comments: very mild R hemiparesis, HOH (hearing aids) Restrictions Weight Bearing Restrictions Per Provider Order: No Pain Pain Assessment Pain Scale: 0-10 Pain Score: 0-No pain Faces Pain Scale: No hurt Pain Type: Acute pain Pain Location: Neck Pain Orientation: Posterior Pain Descriptors / Indicators: Pressure Pain Onset: Other (Comment) (after sitting upright in recliner for prolonged period of time) Patients Stated Pain Goal: 0 Pain Intervention(s): RN made aware;Medication (See eMAR);Distraction;Repositioned Multiple Pain Sites: No Pain Interference Pain Interference Pain Effect on Sleep: 1. Rarely or not at all Pain Interference with Therapy Activities: 1. Rarely or not at all Pain Interference with Day-to-Day Activities: 1. Rarely or not at all Home Living/Prior Functioning Home Living Available Help at Discharge: Family;Available PRN/intermittently (daughter mainly; daughter reports having 2 cousins who could assist but not comitted for regular assist) Type of Home: House Home Access: Stairs to enter Secretary/administrator of Steps: 3 Entrance Stairs-Rails: Left Home Layout:  Multi-level;Bed/bath upstairs (split level) Alternate Level Stairs-Number of Steps: 6 Alternate Level Stairs-Rails: Right Additional Comments: has a cane but doesn't use; does very minimal cooking but otherwise DTR does laundry and cleaning  Lives With: Alone (has hired companions or daughter stays with her at night; but alone during the day) Prior Function Level of Independence: Independent with gait;Independent with transfers;Independent with homemaking with ambulation  Able to Take Stairs?: Yes Driving: Yes Vision/Perception  Vision - History Ability to See in Adequate Light: 1 Impaired Vision - Assessment Eye Alignment: Within Functional Limits Ocular Range of Motion: Within Functional Limits Tracking/Visual Pursuits: Decreased smoothness of horizontal tracking;Decreased smoothness of vertical tracking;Other (comment) (significant difficulty tracking in all directions; difficulty holding eyes on target out of midline) Perception Perception: Within Functional Limits Praxis Praxis: Impaired Praxis Impairment Details: Initiation  Cognition Overall Cognitive Status: Impaired/Different from baseline Arousal/Alertness: Awake/alert Orientation Level: Oriented X4 Year: 2025 Month: January Day of Week: Correct Attention: Sustained;Focused Focused Attention: Appears intact Sustained Attention: Appears intact Memory: Impaired Memory Impairment: Decreased short term memory Awareness: Appears intact Problem Solving: Impaired Problem Solving Impairment: Verbal complex;Functional complex Safety/Judgment: Appears intact Sensation Sensation Light Touch: Appears Intact Hot/Cold: Not tested Proprioception: Appears Intact Coordination Gross Motor Movements are Fluid and Coordinated: No Coordination and Movement Description: GM coordination deficit related to mild Rt sided weakness and overall impaired dynamic balance Motor  Motor Motor: Other (comment) Motor - Skilled Clinical  Observations: mild R hemiparesis, impaired dynamic balance   Trunk/Postural Assessment  Cervical Assessment Cervical Assessment: Exceptions to [facility] (forward head) Thoracic Assessment Thoracic Assessment: Exceptions to [facility] (rounded shoulders) Lumbar Assessment Lumbar Assessment: Exceptions to [facility] (posterior pelvic tilt in sitting) Postural Control Postural Control: Deficits on evaluation Righting Reactions: delayed on R  Balance Balance Balance Assessed: Yes Standardized Balance Assessment Standardized Balance Assessment: Functional Gait Assessment Static Sitting Balance Static Sitting - Balance Support: Feet supported;No upper extremity supported Static Sitting - Level of Assistance: 7: Independent Dynamic Sitting Balance Dynamic Sitting - Balance Support: Feet supported;No upper extremity supported Dynamic Sitting - Level of Assistance: 5: Stand by assistance Static Standing Balance Static Standing - Balance Support: During functional activity;No upper extremity  supported Static Standing - Level of Assistance: 5: Stand by assistance;Other (comment) (CGA) Dynamic Standing Balance Dynamic Standing - Balance Support: During functional activity;No upper extremity supported Dynamic Standing - Level of Assistance: Other (comment);4: Min assist (CGA) Dynamic Standing - Comments: No AD Functional Gait  Assessment Gait assessed : Yes Gait Level Surface: Walks 20 ft, slow speed, abnormal gait pattern, evidence for imbalance or deviates 10-15 in outside of the 12 in walkway width. Requires more than 7 sec to ambulate 20 ft. (requires 11 seconds) Change in Gait Speed: Able to change speed, demonstrates mild gait deviations, deviates 6-10 in outside of the 12 in walkway width, or no gait deviations, unable to achieve a major change in velocity, or uses a change in velocity, or uses an assistive device. Gait with Horizontal Head Turns: Performs head turns smoothly with slight change in gait  velocity (eg, minor disruption to smooth gait path), deviates 6-10 in outside 12 in walkway width, or uses an assistive device. Gait with Vertical Head Turns: Performs task with slight change in gait velocity (eg, minor disruption to smooth gait path), deviates 6 - 10 in outside 12 in walkway width or uses assistive device (more instability with vertical head turns compared to horizontal) Gait and Pivot Turn: Turns slowly, requires verbal cueing, or requires several small steps to catch balance following turn and stop Step Over Obstacle: Cannot perform without assistance. Gait with Narrow Base of Support: Ambulates less than 4 steps heel to toe or cannot perform without assistance. Gait with Eyes Closed: Cannot walk 20 ft without assistance, severe gait deviations or imbalance, deviates greater than 15 in outside 12 in walkway width or will not attempt task. Ambulating Backwards: Cannot walk 20 ft without assistance, severe gait deviations or imbalance, deviates greater than 15 in outside 12 in walkway width or will not attempt task. Steps: Two feet to a stair, must use rail. Total Score: 9 Extremity Assessment  RLE Assessment RLE Assessment: Exceptions to [facility] Active Range of Motion (AROM) Comments: WFL/WNL General Strength Comments: assessed sitting EOB RLE Strength Right Hip Flexion: 4/5 Right Knee Flexion: 4+/5 Right Knee Extension: 4+/5 Right Ankle Dorsiflexion: 4-/5 Right Ankle Plantar Flexion: 4/5 LLE Assessment LLE Assessment: Exceptions to [facility] Active Range of Motion (AROM) Comments: WFL/WNL General Strength Comments: assessed sitting EOB - no significant weakness compared to R side LLE Strength Left Hip Flexion: 4-/5 Left Knee Flexion: 4/5 Left Knee Extension: 4/5 Left Ankle Dorsiflexion: 4-/5 Left Ankle Plantar Flexion: 4-/5  Care Tool Care Tool Bed Mobility Roll left and right activity   Roll left and right assist level: Supervision/Verbal cueing    Sit to lying activity    Sit to lying assist level: Supervision/Verbal cueing    Lying to sitting on side of bed activity   Lying to sitting on side of bed assist level: the ability to move from lying on the back to sitting on the side of the bed with no back support.: Supervision/Verbal cueing     Care Tool Transfers Sit to stand transfer   Sit to stand assist level: Contact Guard/Touching assist    Chair/bed transfer   Chair/bed transfer assist level: Contact Guard/Touching assist    Car transfer Car transfer activity did not occur: Environmental limitations (unavailable)        Care Tool Locomotion Ambulation   Assist level: Contact Guard/Touching assist Assistive device: No Device Max distance: 66ft  Walk 10 feet activity   Assist level: Contact Guard/Touching assist Assistive device: No  Device   Walk 50 feet with 2 turns activity   Assist level: Contact Guard/Touching assist Assistive device: No Device  Walk 150 feet activity Walk 150 feet activity did not occur: Safety/medical concerns      Walk 10 feet on uneven surfaces activity Walk 10 feet on uneven surfaces activity did not occur: Safety/medical concerns      Stairs   Assist level: Minimal Assistance - Patient > 75% Stairs assistive device: 1 hand rail Max number of stairs: 12  Walk up/down 1 step activity   Walk up/down 1 step (curb) assist level: Minimal Assistance - Patient > 75% Walk up/down 1 step or curb assistive device: 1 hand rail  Walk up/down 4 steps activity   Walk up/down 4 steps assist level: Minimal Assistance - Patient > 75% Walk up/down 4 steps assistive device: 1 hand rail  Walk up/down 12 steps activity   Walk up/down 12 steps assist level: Minimal Assistance - Patient > 75% Walk up/down 12 steps assistive device: 1 hand rail  Pick up small objects from floor   Pick up small object from the floor assist level: Minimal Assistance - Patient > 75% Pick up small object from the floor assistive device: HHA   Wheelchair Is the patient using a wheelchair?: Yes (for transport for energy conservation) Type of Wheelchair: Manual   Wheelchair assist level: Dependent - Patient 0%    Wheel 50 feet with 2 turns activity   Assist Level: Dependent - Patient 0%  Wheel 150 feet activity   Assist Level: Dependent - Patient 0%    Refer to Care Plan for Long Term Goals  SHORT TERM GOAL WEEK 1 PT Short Term Goal 1 (Week 1): = to LTGs based on ELOS  Recommendations for other services: Therapeutic Recreation  Kitchen group  Skilled Therapeutic Intervention Pt received supine in bed with NT and nurse present. Pt agreeable to therapy session. Evaluation completed (see details above) with patient education regarding purpose of PT evaluation, PT POC and goals, therapy schedule, weekly team meetings, and other CIR information including safety plan and fall risk safety. Pt performed the below functional mobility tasks with the specified levels of skilled cuing and assistance. During gait training pt noted to have partially shuffled gait pattern with poor foot clearance bilaterally (R>L) and slight R lateral trunk lean bias along with decreased gait speed overall. Performed stair navigation using only 1 HR (either R or L) to simulate home environment with step-to pattern leading with R LE on ascent and L LE on descent - started with only 1 UE support on each HR but with fatigue at end transitioned to B UE support on the rail and sideways pattern. Pt participated in Functional Gait Assessment (FGA) with score of 9/30 demonstrating high fall risk (low fall risk 25-28, medium fall risk 19-24, and high fall risk <19). Pt fatigued and at end of session left supine in bed with needs in reach and bed alarm on.   Mobility Bed Mobility Bed Mobility: Supine to Sit;Sit to Supine Supine to Sit: Supervision/Verbal cueing Sit to Supine: Supervision/Verbal cueing Transfers Transfers: Sit to Stand;Stand to Sit;Stand Pivot  Transfers Sit to Stand: Contact Guard/Touching assist Stand to Sit: Contact Guard/Touching assist Stand Pivot Transfers: Contact Guard/Touching assist Stand Pivot Transfer Details: Tactile cues for weight shifting;Verbal cues for sequencing;Verbal cues for technique Transfer (Assistive device): None Locomotion  Gait Ambulation: Yes Gait Assistance: Contact Guard/Touching assist Gait Distance (Feet): 73 Feet (x2 (seated break)) Assistive device: None Gait  Assistance Details: Tactile cues for weight shifting;Verbal cues for technique;Verbal cues for gait pattern;Visual cues/gestures for sequencing;Verbal cues for sequencing Gait Gait: Yes Gait Pattern: Impaired Gait Pattern: Step-through pattern;Decreased step length - right;Decreased step length - left;Poor foot clearance - left;Poor foot clearance - right;Shuffle;Decreased hip/knee flexion - right;Decreased hip/knee flexion - left Gait velocity: significantly decreased Stairs / Additional Locomotion Stairs: Yes Stairs Assistance: Minimal Assistance - Patient > 75%;Contact Guard/Touching assist Stair Management Technique: Step to pattern;One rail Right;One rail Left;Forwards;Sideways Number of Stairs: 12 Height of Stairs: 6 Wheelchair Mobility Wheelchair Mobility: No   Discharge Criteria: Patient will be discharged from PT if patient refuses treatment 3 consecutive times without medical reason, if treatment goals not met, if there is a change in medical status, if patient makes no progress towards goals or if patient is discharged from hospital.  The above assessment, treatment plan, treatment alternatives and goals were discussed and mutually agreed upon: by patient  Ginny Forth , PT, DPT, NCS, CSRS 03/01/2023, 3:26 PM

## 2023-03-01 NOTE — Discharge Instructions (Signed)
Inpatient Rehab Discharge Instructions  Lynn Ray Discharge date and time:    Activities/Precautions/ Functional Status: Activity: no lifting, driving, or strenuous exercise until cleared by MD Diet: cardiac diet Wound Care: none needed Functional status:  ___ No restrictions     ___ Walk up steps independently _x__ 24/7 supervision/assistance   ___ Walk up steps with assistance ___ Intermittent supervision/assistance  ___ Bathe/dress independently ___ Walk with walker     ___ Bathe/dress with assistance ___ Walk Independently    ___ Shower independently ___ Walk with assistance    __x_ Shower with assistance _x__ No alcohol     ___ Return to work/school ________  Special Instructions: No driving, alcohol consumption or tobacco use.  STROKE/TIA DISCHARGE INSTRUCTIONS SMOKING Cigarette smoking nearly doubles your risk of having a stroke & is the single most alterable risk factor  If you smoke or have smoked in the last 12 months, you are advised to quit smoking for your health. Most of the excess cardiovascular risk related to smoking disappears within a year of stopping. Ask you doctor about anti-smoking medications [facility] Quit Line: 1-800-QUIT NOW Free Smoking Cessation Classes [phone]  CHOLESTEROL Know your levels; limit fat & cholesterol in your diet  Lipid Panel     Component Value Date/Time   CHOL 156 02/23/2023 0458   TRIG 115 02/23/2023 0458   HDL 49 02/23/2023 0458   CHOLHDL 3.2 02/23/2023 0458   VLDL 23 02/23/2023 0458   LDLCALC 84 02/23/2023 0458     Many patients benefit from treatment even if their cholesterol is at goal. Goal: Total Cholesterol (CHOL) less than 160 Goal:  Triglycerides (TRIG) less than 150 Goal:  HDL greater than 40 Goal:  LDL (LDLCALC) less than 100   BLOOD PRESSURE American Stroke Association blood pressure target is less that 120/80 mm/Hg  Your discharge blood pressure is:  BP: 137/64 Monitor your blood pressure Limit your salt and  alcohol intake Many individuals will require more than one medication for high blood pressure  DIABETES (A1c is a blood sugar average for last 3 months) Goal HGBA1c is under 7% (HBGA1c is blood sugar average for last 3 months)  Diabetes: Diagnosis of diabetes:  Your A1c:5.8 %    Lab Results  Component Value Date   HGBA1C 5.8 (H) 02/23/2023    Your HGBA1c can be lowered with medications, healthy diet, and exercise. Check your blood sugar as directed by your physician Call your physician if you experience unexplained or low blood sugars.  PHYSICAL ACTIVITY/REHABILITATION Goal is 30 minutes at least 4 days per week  Activity: Increase activity slowly, Therapies: Physical Therapy: Home Health, Occupational Therapy: Home Health, and Speech Therapy: Home Health Return to work: n/a Activity decreases your risk of heart attack and stroke and makes your heart stronger.  It helps control your weight and blood pressure; helps you relax and can improve your mood. Participate in a regular exercise program. Talk with your doctor about the best form of exercise for you (dancing, walking, swimming, cycling).  DIET/WEIGHT Goal is to maintain a healthy weight  Your discharge diet is:  Diet Order             Diet Heart Room service appropriate? Yes; Fluid consistency: Thin  Diet effective now                  thin liquids Your height is:  Height: 4' 11.5" (151.1 cm) Your current weight is: Weight: 82.2 kg Your Body Mass  Index (BMI) is:  BMI (Calculated): 36 Following the type of diet specifically designed for you will help prevent another stroke. Your goal weight range is:   Your goal Body Mass Index (BMI) is 19-24. Healthy food habits can help reduce 3 risk factors for stroke:  High cholesterol, hypertension, and excess weight.  RESOURCES Stroke/Support Group:  Call [phone]   STROKE EDUCATION PROVIDED/REVIEWED AND GIVEN TO PATIENT Stroke warning signs and symptoms How to activate  emergency medical system (call 911). Medications prescribed at discharge. Need for follow-up after discharge. Personal risk factors for stroke. Pneumonia vaccine given: No Flu vaccine given: No My questions have been answered, the writing is legible, and I understand these instructions.  I will adhere to these goals & educational materials that have been provided to me after my discharge from the hospital.     My questions have been answered and I understand these instructions. I will adhere to these goals and the provided educational materials after my discharge from the hospital.  Patient/Caregiver Signature _______________________________ Date __________  Clinician Signature _______________________________________ Date __________  Please bring this form and your medication list with you to all your follow-up doctor's appointments.

## 2023-03-01 NOTE — Progress Notes (Signed)
PROGRESS NOTE   Subjective/Complaints:  Daughter at bedside who states the patient had a good night.  The patient got up by herself last night however without assistance.  She did not fall however Review of systems negative chest pain shortness of breath nausea vomiting diarrhea or constipation.  Objective:   No results found. Recent Labs    03/01/23 0546  WBC 6.1  HGB 11.1*  HCT 34.8*  PLT 253   Recent Labs    03/01/23 0546  NA 138  K 4.9  CL 107  CO2 24  GLUCOSE 87  BUN 26*  CREATININE 2.20*  CALCIUM 9.9   No intake or output data in the 24 hours ending 03/01/23 0820      Physical Exam: Vital Signs Blood pressure 137/64, pulse 78, temperature 98.2 F (36.8 C), temperature source Oral, resp. rate 18, height 4' 11.5" (1.511 m), weight 82.2 kg, SpO2 100%.   General: No acute distress Mood and affect are appropriate Heart: Regular rate and rhythm no rubs murmurs or extra sounds Lungs: Clear to auscultation, breathing unlabored, no rales or wheezes Abdomen: Positive bowel sounds, soft nontender to palpation, nondistended Extremities: No clubbing, cyanosis, or edema Skin: No evidence of breakdown, no evidence of rash Neurologic: Cranial nerves II through XII intact, motor strength is 5/5 in bilateral deltoid, bicep, tricep, grip, hip flexor, knee extensors, ankle dorsiflexor and plantar flexor Sensory exam normal sensation to light touch and proprioception in bilateral upper and lower extremities Cerebellar exam normal finger to nose to finger as well as heel to shin in bilateral upper and lower extremities Musculoskeletal: Full range of motion in all 4 extremities. No joint swelling   Assessment/Plan: 1. Functional deficits which require 3+ hours per day of interdisciplinary therapy in a comprehensive inpatient rehab setting. Physiatrist is providing close team supervision and 24 hour management of active  medical problems listed below. Physiatrist and rehab team continue to assess barriers to discharge/monitor patient progress toward functional and medical goals  Care Tool:  Bathing              Bathing assist       Upper Body Dressing/Undressing Upper body dressing        Upper body assist      Lower Body Dressing/Undressing Lower body dressing            Lower body assist       Toileting Toileting    Toileting assist       Transfers Chair/bed transfer  Transfers assist           Locomotion Ambulation   Ambulation assist              Walk 10 feet activity   Assist           Walk 50 feet activity   Assist           Walk 150 feet activity   Assist           Walk 10 feet on uneven surface  activity   Assist           Wheelchair     Assist  Wheelchair 50 feet with 2 turns activity    Assist            Wheelchair 150 feet activity     Assist          Blood pressure 137/64, pulse 78, temperature 98.2 F (36.8 C), temperature source Oral, resp. rate 18, height 4' 11.5" (1.511 m), weight 82.2 kg, SpO2 100%.  Medical Problem List and Plan: 1. 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  Antithrombotics: -DVT/anticoagulation:  Pharmaceutical: Lovenox 30 mg daily             -antiplatelet therapy: continue Aspirin and Plavix for three weeks followed by aspirin alone   3. Pain Management: Tylenol as needed   4. Mood/Behavior/Sleep: LCSW to evaluate and provide emotional support             -antipsychotic agents: n/a   5. Neuropsych/cognition: This patient is capable of making decisions on her own behalf.   6. Skin/Wound Care: Routine skin care checks   7. Fluids/Electrolytes/Nutrition: Routine Is and Os and follow-up chemistries              -continue B12 supplement daily   8: Hypertension: monitor TID and prn   9: Hypothyroidism: continue Synthroid   10: CKD: baseline creatinine ~2.0             -follow-up BMP   11: Chronic anemia: H and H stable; follow-up CBC   12. Morbid obesity: provide dietary education    LOS: 1 days A FACE TO FACE EVALUATION WAS PERFORMED  Erick Colace 03/01/2023, 8:20 AM

## 2023-03-02 DIAGNOSIS — I63133 Cerebral infarction due to embolism of bilateral carotid arteries: Secondary | ICD-10-CM | POA: Diagnosis not present

## 2023-03-02 NOTE — Progress Notes (Signed)
Occupational Therapy Session Note  Patient Details  Name: ARIEAL CUOCO MRN: [id number] Date of Birth: 02/06/1934  {CHL IP REHAB OT TIME CALCULATIONS:[id number]}   Short Term Goals: Week 1:  OT Short Term Goal 1 (Week 1): STG = LTG due to ELOS  Skilled Therapeutic Interventions/Progress Updates: Pt greeted *** for skilled OT session with focus on ***.   Pain: Pt reported ***/10 pain, stating "***" in reference to ***. OT offering intermediate rest breaks and positioning suggestions throughout session to address pain/fatigue and maximize participation/safety in session.   Functional Transfers:  Self Care Tasks:  Therapeutic Activities:  Therapeutic Exercise:   Education:  Pt remained *** with 4Ps assessed and immediate needs met. Pt continues to be appropriate for skilled OT intervention to promote further functional independence in ADLs/IADLs.   Therapy Documentation Precautions:  Precautions Precautions: Fall, Other (comment) Precaution Comments: very mild R hemiparesis, HOH (hearing aids) Restrictions Weight Bearing Restrictions Per Provider Order: No   Therapy/Group: Individual Therapy  Lou Cal, OTR/L, MSOT  03/02/2023, 6:47 PM

## 2023-03-02 NOTE — Progress Notes (Signed)
Patient ID: Lynn Ray, female   DOB: Jun 22, 1934, 89 y.o.   MRN: [id number] Met with the patient and grandson to review current medical situation, rehab schedule, team conference and plan of care. Discussed secondary risk management including HTN,, HLD on Lipitor with prediabetes. Discussed DAPT x 3 weeks then ASA solo along with other medications and HH dietary modifications.  Assisted with My Chart access update.  Reports she has lifeline at home. Continue to follow for placement of 30 cardiac monitor; briefly reviewed rationale for monitoring. Continue to follow along to address educational needs to facilitate preparation for discharge. Pamelia Hoit

## 2023-03-02 NOTE — IPOC Note (Signed)
Overall Plan of Care [facility]) Patient Details Name: Lynn Ray MRN: [id number] DOB: 20-Sep-1934  Admitting Diagnosis: CVA (cerebral vascular accident) [facility])  Hospital Problems: Principal Problem:   CVA (cerebral vascular accident) (HCC)     Functional Problem List: Nursing Safety, Endurance, Medication Management  PT Balance, Endurance, Motor, Safety  OT Balance, Endurance, Motor, Edema  SLP Cognition, Linguistic  TR         Basic ADL's: OT Bathing, Dressing, Toileting     Advanced  ADL's: OT Simple Meal Preparation     Transfers: PT Bed Mobility, Bed to Chair, Car, Furniture, Civil Service fast streamer, Research scientist (life sciences): PT Ambulation, Stairs     Additional Impairments: OT None  SLP Social Cognition   Memory, Problem Solving  TR      Anticipated Outcomes Item Anticipated Outcome  Self Feeding Independent  Swallowing      Basic self-care  Mod I  Toileting  Mod I   Bathroom Transfers Mod I  Bowel/Bladder  n/a  Transfers  mod-I using LRAD  Locomotion  mod-I using LRAD household level  Communication  supervision A  Cognition  supervision A  Pain  n/a  Safety/Judgment  manage w cues   Therapy Plan: PT Intensity: Minimum of 1-2 x/day ,45 to 90 minutes PT Frequency: 5 out of 7 days PT Duration Estimated Length of Stay: 7-10 days OT Intensity: Minimum of 1-2 x/day, 45 to 90 minutes OT Frequency: 5 out of 7 days OT Duration/Estimated Length of Stay: 7-10 days SLP Intensity: Minumum of 1-2 x/day, 30 to 90 minutes SLP Frequency: 3 to 5 out of 7 days SLP Duration/Estimated Length of Stay: 7-10 days   Team Interventions: Nursing Interventions Patient/Family Education, Disease Management/Prevention, Discharge Planning, Medication Management  PT interventions Ambulation/gait training, Community reintegration, DME/adaptive equipment instruction, Neuromuscular re-education, Psychosocial support, Stair training, UE/LE Strength taining/ROM,  Warden/ranger, Discharge planning, Pain management, Skin care/wound management, Therapeutic Activities, UE/LE Coordination activities, Cognitive remediation/compensation, Disease management/prevention, Functional mobility training, Patient/family education, Therapeutic Exercise, Visual/perceptual remediation/compensation  OT Interventions Balance/vestibular training, Discharge planning, Functional electrical stimulation, Pain management, Self Care/advanced ADL retraining, Therapeutic Activities, UE/LE Coordination activities, Disease mangement/prevention, Functional mobility training, Patient/family education, Therapeutic Exercise, DME/adaptive equipment instruction, Neuromuscular re-education, UE/LE Strength taining/ROM  SLP Interventions Cognitive remediation/compensation, Internal/external aids, Speech/Language facilitation, Cueing hierarchy, Therapeutic Activities, Functional tasks, Multimodal communication approach, Patient/family education  TR Interventions    SW/CM Interventions Discharge Planning, Psychosocial Support, Patient/Family Education   Barriers to Discharge MD  Medical stability and Weight  Nursing Lack of/limited family support, Home environment access/layout Multi level 3 ste right rail; 6 step to bedroom solo; dtr to assist prn  PT Decreased caregiver support, Home environment access/layout    OT Home environment access/layout, Inaccessible home environment, Decreased caregiver support    SLP      SW Insurance for SNF coverage, Decreased caregiver support, Lack of/limited family support     Team Discharge Planning: Destination: PT-Home ,OT- Home , SLP-Home Projected Follow-up: PT-Home health PT, 24 hour supervision/assistance, OT-  Outpatient OT, SLP-None Projected Equipment Needs: PT-To be determined, OT- To be determined, SLP-None recommended by SLP Equipment Details: PT- , OT-  Patient/family involved in discharge planning: PT- Patient,  OT-Patient,  Family member/caregiver, SLP-Patient  MD ELOS: 7d Medical Rehab Prognosis:  Excellent Assessment: The patient has been admitted for CIR therapies with the diagnosis of CVA. The team will be addressing functional mobility, strength, stamina, balance, safety, adaptive techniques and equipment, self-care, bowel and bladder  mgt, patient and caregiver education, BP management. Goals have been set at Mod I. Anticipated discharge destination is home .        See Team Conference Notes for weekly updates to the plan of care

## 2023-03-02 NOTE — Progress Notes (Signed)
PROGRESS NOTE   Subjective/Complaints:  RIght shoulder pain with elevation , chronic, Sports med visit 01/20/20 Hx suboccipital decompression for Chiari malformation 2002  C spine MRI 2020,  no cervical spinal stenosis 12/22/[place]- severe glenohumeral osteoarthritis  SHe reportedly responded well to Right glenohumeral steroid injection   Review of systems negative chest pain shortness of breath nausea vomiting diarrhea or constipation.  Objective:   No results found. Recent Labs    03/01/23 0546  WBC 6.1  HGB 11.1*  HCT 34.8*  PLT 253   Recent Labs    03/01/23 0546  NA 138  K 4.9  CL 107  CO2 24  GLUCOSE 87  BUN 26*  CREATININE 2.20*  CALCIUM 9.9    Intake/Output Summary (Last 24 hours) at 03/02/2023 0730 Last data filed at 03/02/2023 0600 Gross per 24 hour  Intake 720 ml  Output 1 ml  Net 719 ml        Physical Exam: Vital Signs Blood pressure (!) 134/57, pulse 97, temperature 98 F (36.7 C), temperature source Oral, resp. rate 18, height 4' 11.5" (1.511 m), weight 82.2 kg, SpO2 100%.   General: No acute distress Mood and affect are appropriate Heart: Regular rate and rhythm no rubs murmurs or extra sounds Lungs: Clear to auscultation, breathing unlabored, no rales or wheezes Abdomen: Positive bowel sounds, soft nontender to palpation, nondistended Extremities: No clubbing, cyanosis, or edema Skin: No evidence of breakdown, no evidence of rash Neurologic: Cranial nerves II through XII intact, motor strength is 5/5 in bilateral deltoid, bicep, tricep, grip, hip flexor, knee extensors, ankle dorsiflexor and plantar flexor Sensory exam normal sensation to light touch in bilateral upper and lower extremities Cerebellar exam normal finger to nose to finger as well as heel to shin in bilateral upper and lower extremities Musculoskeletal: Full range of motion in all 4 extremities. No joint  swelling  Pain limiting right shoulder elevation (pt states this is chronic due to Chiari malformation   Assessment/Plan: 1. Functional deficits which require 3+ hours per day of interdisciplinary therapy in a comprehensive inpatient rehab setting. Physiatrist is providing close team supervision and 24 hour management of active medical problems listed below. Physiatrist and rehab team continue to assess barriers to discharge/monitor patient progress toward functional and medical goals  Care Tool:  Bathing    Body parts bathed by patient: Right arm, Left arm, Chest, Abdomen, Front perineal area, Buttocks, Right upper leg, Left upper leg, Right lower leg, Left lower leg, Face         Bathing assist Assist Level: Contact Guard/Touching assist     Upper Body Dressing/Undressing Upper body dressing   What is the patient wearing?: Pull over shirt, Bra    Upper body assist Assist Level: Minimal Assistance - Patient > 75%    Lower Body Dressing/Undressing Lower body dressing      What is the patient wearing?: Incontinence brief, Pants     Lower body assist Assist for lower body dressing: Minimal Assistance - Patient > 75%     Toileting Toileting    Toileting assist Assist for toileting: Minimal Assistance - Patient > 75%     Transfers Chair/bed  transfer  Transfers assist     Chair/bed transfer assist level: Contact Guard/Touching assist     Locomotion Ambulation   Ambulation assist      Assist level: Contact Guard/Touching assist Assistive device: No Device Max distance: 16ft   Walk 10 feet activity   Assist     Assist level: Contact Guard/Touching assist Assistive device: No Device   Walk 50 feet activity   Assist    Assist level: Contact Guard/Touching assist Assistive device: No Device    Walk 150 feet activity   Assist Walk 150 feet activity did not occur: Safety/medical concerns         Walk 10 feet on uneven surface   activity   Assist Walk 10 feet on uneven surfaces activity did not occur: Safety/medical concerns         Wheelchair     Assist Is the patient using a wheelchair?: Yes (for transport for energy conservation) Type of Wheelchair: Manual    Wheelchair assist level: Dependent - Patient 0%      Wheelchair 50 feet with 2 turns activity    Assist        Assist Level: Dependent - Patient 0%   Wheelchair 150 feet activity     Assist      Assist Level: Dependent - Patient 0%   Blood pressure (!) 134/57, pulse 97, temperature 98 F (36.7 C), temperature source Oral, resp. rate 18, height 4' 11.5" (1.511 m), weight 82.2 kg, SpO2 100%.  Medical Problem List and Plan: 1. 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  Antithrombotics: -DVT/anticoagulation:  Pharmaceutical: Lovenox 30 mg daily             -antiplatelet therapy: continue Aspirin and Plavix for three weeks followed by aspirin alone   3. Pain Management: Tylenol as needed   4. Mood/Behavior/Sleep: LCSW to evaluate and provide emotional support             -antipsychotic agents: n/a   5. Neuropsych/cognition: This patient is capable of making decisions on her own behalf.   6. Skin/Wound Care: Routine skin care checks   7. Fluids/Electrolytes/Nutrition: Routine Is and Os and follow-up chemistries             -continue B12 supplement daily   8: Hypertension: monitor TID and prn   9: Hypothyroidism: continue Synthroid   10: CKD: baseline creatinine ~2.0                 Latest Ref Rng & Units 03/01/2023    5:46 AM 02/24/2023    5:14 AM 02/23/2023    4:58 AM  BMP  Glucose 70 - 99 mg/dL 87  87    BUN 8 - 23 mg/dL 26  21    Creatinine 9.14 - 1.00 mg/dL 7.82  9.56  2.13   Sodium 135 - 145 mmol/L 138  137    Potassium 3.5 - 5.1 mmol/L 4.9  3.9    Chloride 98 - 111  mmol/L 107  109    CO2 22 - 32 mmol/L 24  21    Calcium 8.9 - 10.3 mg/dL 9.9  9.6     BUN slightly elevated as  well will enc po fluid    11: Chronic anemia: H and H stable; follow-upHgb improving     Latest Ref Rng & Units 03/01/2023    5:46 AM 02/24/2023    5:14 AM 02/23/2023    4:58 AM  CBC  WBC 4.0 - 10.5 K/uL 6.1  6.0  6.8   Hemoglobin 12.0 - 15.0 g/dL 78.2  95.6  9.9   Hematocrit 36.0 - 46.0 % 34.8  33.8  31.5   Platelets 150 - 400 K/uL 253  222  220       12. Morbid obesity: provide dietary education   13.  Severe glenohumeral osteoarthritis will consider repeat steroid injection if this interferes with OT/PT LOS: 2 days A FACE TO FACE EVALUATION WAS PERFORMED  Erick Colace 03/02/2023, 7:30 AM

## 2023-03-02 NOTE — Progress Notes (Signed)
Occupational Therapy Session Note  Patient Details  Name: Lynn Ray MRN: [id number] Date of Birth: 08-30-1934  Today's Date: 03/02/2023 OT Individual Time: 0800-0910 OT Individual Time Calculation (min): 70 min    Short Term Goals: Week 1:  OT Short Term Goal 1 (Week 1): STG = LTG due to ELOS  Skilled Therapeutic Interventions/Progress Updates:     Pt received sitting EOB with grand son present in room. Pt presenting to be in good spirits receptive to skilled OT session reporting 0/10 pain- OT offering intermittent rest breaks, repositioning, and therapeutic support to optimize participation in therapy session. Pt requesting to take shower this AM. Focus this session BADL retraining with emphasis on completing Pt's morning routine to increase safety and independence in BADLs. Pt completed functional mobility and ambulatory shower transfers this session with CGA-min HHA without LOB noted- mild instability with decreased lateral weight shifting. Pt doffed clothing while seated on shower chair using grab bars for balance CGA. Pt completed majority of shower in standing position for increased balance and endurance challenge utilizing grab bars for balance. Pt able to sequence through shower and attend to R hemi-body without verbal cues required. Pt maintained balance with CGA (no LOB noted) and utilized R UE at a diminished level during shower. Education provided on long-handled sponge use with Pt utilizing it to wash B LEs in seated position. Following shower, Pt completed U/LB dressing seated in wc using hemi-techniques. Pt able to donn shirt with supervision and weave B LEs into pants by leaning forwards towards ground and standing without AD to bring pants to waist CGA. Pt donned bilateral socks by crossing legs into figure-four position with set-up assist. Engaged pt in completing grooming/hygiene tasks in standing position at sink for increased balance and endurance challenge no AD. Pt able to  wash face, groom hair, apply location, and complete oral care with CGA- no LOB or dropping of toiletry items noted. Seated Rest break provided.  Issued Pt pink (moderate resistance) Therputty and educated Pt on purpose with handout on R hand exercises provided. Engaged Pt in completing 1x10 reps of the following exercises to increase R hand strength for improved functional use during BADLs:  -Gross grasp -Thumb Adduction -Thumb Flexion -Lateral Pinch -Individual digit presses Pt was left resting sitting EOB with call bell in reach, bed alarm on, grand son present in room, and all needs met.    Therapy Documentation Precautions:  Precautions Precautions: Fall, Other (comment) Precaution Comments: very mild R hemiparesis, HOH (hearing aids) Restrictions Weight Bearing Restrictions Per Provider Order: No  Therapy/Group: Individual Therapy  Clide Deutscher 03/02/2023, 7:55 AM

## 2023-03-02 NOTE — Progress Notes (Signed)
Physical Therapy Session Note  Patient Details  Name: Lynn Ray MRN: [id number] Date of Birth: [birth year]  Today's Date: 03/02/2023 PT Individual Time: 1131-1202 PT Individual Time Calculation (min): 31 min   Short Term Goals: Week 1:  PT Short Term Goal 1 (Week 1): = to LTGs based on ELOS  Skilled Therapeutic Interventions/Progress Updates:  Patient seated upright on EOB on entrance to room. Patient alert and agreeable to PT session.   Patient with no pain complaint at start of session.  Therapeutic Activity: Transfers: Pt performed sit<>stand and stand pivot transfers throughout session with light CGA/ supervision. Provided vc/ tc for reminder of earlier learned technique.  Gait Training:  Pt ambulated 168ft x2 using no AD with CGA. Demonstrated low step height initially but able to improve with vc. Also, provided vc/ tc for upright posture and heel-to-toe step advancement.  Patient seated on EOB at end of session with brakes locked, bed alarm set, and all needs within reach.   Therapy Documentation Precautions:  Precautions Precautions: Fall, Other (comment) Precaution Comments: very mild R hemiparesis, HOH (hearing aids) Restrictions Weight Bearing Restrictions Per Provider Order: No  Pain:  No pain related this session.   Therapy/Group: Individual Therapy  Loel Dubonnet PT, DPT, CSRS 03/02/2023, 12:57 PM

## 2023-03-02 NOTE — Progress Notes (Signed)
Physical Therapy Session Note  Patient Details  Name: Lynn Ray MRN: [id number] Date of Birth: Aug 21, 1934  Today's Date: 03/02/2023 PT Individual Time: 0922-1005 PT Individual Time Calculation (min): 43 min   Short Term Goals: Week 1:  PT Short Term Goal 1 (Week 1): = to LTGs based on ELOS  Skilled Therapeutic Interventions/Progress Updates:  Patient seated on EOB on entrance to room. Grandson present. Patient alert and agreeable to PT session.   Patient with no pain complaint at start of session.  Therapeutic Activity: Transfers: Pt performed sit<>stand and stand pivot transfers throughout session with close supervision. Provided vc/ tc for increasing forward lean with hip hinge rather than dependence on Bil knee extension into seated surface for assist with decreased strength. Blocked practice for improving technique including forward scoot, posterior feet positioning, push with BUE from seated surface and hip hinge for weight shift forward.   Neuromuscular Re-ed: NMR facilitated during session with focus on standing balance. Pt guided in performance of berg Balance test. See below for scoring. Patient demonstrates increased fall risk as noted by score of  35 /56 on Berg Balance Scale.  (<36= high risk for falls, close to 100%; 37-45 significant >80%; 46-51 moderate >50%; 52-55 lower >25%).   Balance training focused on areas of noted impairment which include NBOS, single leg stance, turning, trunk rotation.   NMR performed for improvements in motor control and coordination, balance, sequencing, judgement, and self confidence/ efficacy in performing all aspects of mobility at highest level of independence.   Patient seated on EOB at end of session with brakes locked, bed alarm set, and all needs within reach. Case Manager and social worker present at end of session to talk with pt and pt's grandson.   Therapy Documentation Precautions:  Precautions Precautions: Fall, Other  (comment) Precaution Comments: very mild R hemiparesis, HOH (hearing aids) Restrictions Weight Bearing Restrictions Per Provider Order: No  Pain:  No pain related this session.   Balance: Balance Balance Assessed: Yes Standardized Balance Assessment Standardized Balance Assessment: Berg Balance Test Berg Balance Test Sit to Stand: Able to stand  independently using hands Standing Unsupported: Able to stand 2 minutes with supervision Sitting with Back Unsupported but Feet Supported on Floor or Stool: Able to sit safely and securely 2 minutes Stand to Sit: Controls descent by using hands Transfers: Able to transfer safely, definite need of hands Standing Unsupported with Eyes Closed: Able to stand 10 seconds with supervision Standing Ubsupported with Feet Together: Able to place feet together independently and stand for 1 minute with supervision From Standing, Reach Forward with Outstretched Arm: Can reach forward >12 cm safely (5") From Standing Position, Pick up Object from Floor: Able to pick up shoe, needs supervision From Standing Position, Turn to Look Behind Over each Shoulder: Turn sideways only but maintains balance Turn 360 Degrees: Needs close supervision or verbal cueing Standing Unsupported, Alternately Place Feet on Step/Stool: Needs assistance to keep from falling or unable to try Standing Unsupported, One Foot in Front: Able to plae foot ahead of the other independently and hold 30 seconds Standing on One Leg: Tries to lift leg/unable to hold 3 seconds but remains standing independently Total Score: 35   Therapy/Group: Individual Therapy  Loel Dubonnet PT, DPT, CSRS 03/02/2023, 10:31 AM

## 2023-03-02 NOTE — Progress Notes (Signed)
Physical Therapy Session Note  Patient Details  Name: Lynn Ray MRN: [id number] Date of Birth: Jun 29, 1934  Today's Date: 03/02/2023 PT Individual Time: 4010-2725 PT Individual Time Calculation (min): 41 min   Short Term Goals: Week 1:  PT Short Term Goal 1 (Week 1): = to LTGs based on ELOS  Skilled Therapeutic Interventions/Progress Updates:   Received pt sitting in standard chair on phone. Pt agreeable to PT treatment and denied any pain during session. Session with emphasis on functional mobility/transfers, generalized strengthening and endurance, dynamic standing balance/coordination, and gait training. Pt performed all transfers without AD with CGA fading to close supervision throughout session - noted difficulty standing from lower surfaces.   Pt ambulated >366ft without AD and CGA/close supervision to dayroom. Pt ambulates with shuffling gait pattern and decreased bilateral foot clearance R>L - minimal improvements noted with cues to "pick feet up". Stood on Airex x 3 trials with CGA and worked on Librarian, academic horseshoes with RUE and CGA for balance x 3 trials with emphasis on functional use of RUE. Pt with 1 LOB requiring min A to correct and appeared to enjoy game, smiling and chatting throughout. During seated rest break educated pt on signs/symptoms of stroke ("BE FAST") - pt reported experiencing vision and speech changes when she experienced her stroke. Transported back to room in [facility] dependently due to fatigue, ambulated to bed without AD and supervision, and transitioned into supine mod I. Concluded session with pt semi-reclined in bed, needs within reach, and bed alarm on.   Therapy Documentation Precautions:  Precautions Precautions: Fall, Other (comment) Precaution Comments: very mild R hemiparesis, HOH (hearing aids) Restrictions Weight Bearing Restrictions Per Provider Order: No  Therapy/Group: Individual Therapy Marlana Salvage Zaunegger Blima Rich PT,  DPT 03/02/2023, 7:15 AM

## 2023-03-03 DIAGNOSIS — I63133 Cerebral infarction due to embolism of bilateral carotid arteries: Secondary | ICD-10-CM | POA: Diagnosis not present

## 2023-03-03 NOTE — Progress Notes (Signed)
PROGRESS NOTE   Subjective/Complaints:    Review of systems negative chest pain shortness of breath nausea vomiting diarrhea or constipation.  Objective:   No results found. Recent Labs    03/01/23 0546  WBC 6.1  HGB 11.1*  HCT 34.8*  PLT 253   Recent Labs    03/01/23 0546  NA 138  K 4.9  CL 107  CO2 24  GLUCOSE 87  BUN 26*  CREATININE 2.20*  CALCIUM 9.9    Intake/Output Summary (Last 24 hours) at 03/03/2023 0917 Last data filed at 03/03/2023 0817 Gross per 24 hour  Intake 1020 ml  Output --  Net 1020 ml        Physical Exam: Vital Signs Blood pressure (!) 135/52, pulse 80, temperature 98.2 F (36.8 C), resp. rate 18, height 4' 11.5" (1.511 m), weight 80.2 kg, SpO2 99%.   General: No acute distress Mood and affect are appropriate Heart: Regular rate and rhythm no rubs murmurs or extra sounds Lungs: Clear to auscultation, breathing unlabored, no rales or wheezes Abdomen: Positive bowel sounds, soft nontender to palpation, nondistended Extremities: No clubbing, cyanosis, or edema Skin: No evidence of breakdown, no evidence of rash Neurologic: Cranial nerves II through XII intact, motor strength is 5/5 in bilateral deltoid, bicep, tricep, grip, hip flexor, knee extensors, ankle dorsiflexor and plantar flexor Sensory exam normal sensation to light touch in bilateral upper and lower extremities Cerebellar exam normal finger to nose to finger as well as heel to shin in bilateral upper and lower extremities Musculoskeletal: Full range of motion lower extremities. No joint swelling  Pain limiting right shoulder elevation  Assessment/Plan: 1. Functional deficits which require 3+ hours per day of interdisciplinary therapy in a comprehensive inpatient rehab setting. Physiatrist is providing close team supervision and 24 hour management of active medical problems listed below. Physiatrist and rehab team continue  to assess barriers to discharge/monitor patient progress toward functional and medical goals  Care Tool:  Bathing    Body parts bathed by patient: Right arm, Left arm, Chest, Abdomen, Front perineal area, Buttocks, Right upper leg, Left upper leg, Right lower leg, Left lower leg, Face         Bathing assist Assist Level: Contact Guard/Touching assist     Upper Body Dressing/Undressing Upper body dressing   What is the patient wearing?: Pull over shirt, Bra    Upper body assist Assist Level: Minimal Assistance - Patient > 75%    Lower Body Dressing/Undressing Lower body dressing      What is the patient wearing?: Incontinence brief, Pants     Lower body assist Assist for lower body dressing: Minimal Assistance - Patient > 75%     Toileting Toileting    Toileting assist Assist for toileting: Minimal Assistance - Patient > 75%     Transfers Chair/bed transfer  Transfers assist     Chair/bed transfer assist level: Contact Guard/Touching assist     Locomotion Ambulation   Ambulation assist      Assist level: Contact Guard/Touching assist Assistive device: No Device Max distance: >331ft   Walk 10 feet activity   Assist     Assist level: Contact Guard/Touching assist  Assistive device: No Device   Walk 50 feet activity   Assist    Assist level: Contact Guard/Touching assist Assistive device: No Device    Walk 150 feet activity   Assist Walk 150 feet activity did not occur: Safety/medical concerns  Assist level: Contact Guard/Touching assist Assistive device: No Device    Walk 10 feet on uneven surface  activity   Assist Walk 10 feet on uneven surfaces activity did not occur: Safety/medical concerns         Wheelchair     Assist Is the patient using a wheelchair?: Yes (for transport for energy conservation) Type of Wheelchair: Manual    Wheelchair assist level: Dependent - Patient 0%      Wheelchair 50 feet with 2 turns  activity    Assist        Assist Level: Dependent - Patient 0%   Wheelchair 150 feet activity     Assist      Assist Level: Dependent - Patient 0%   Blood pressure (!) 135/52, pulse 80, temperature 98.2 F (36.8 C), resp. rate 18, height 4' 11.5" (1.511 m), weight 80.2 kg, SpO2 99%.  Medical Problem List and Plan: 1. 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  Antithrombotics: -DVT/anticoagulation:  Pharmaceutical: Lovenox 30 mg daily             -antiplatelet therapy: continue Aspirin and Plavix for three weeks followed by aspirin alone   3. Pain Management: Tylenol as needed  4. Mood/Behavior/Sleep: LCSW to evaluate and provide emotional support             -antipsychotic agents: n/a   5. Neuropsych/cognition: This patient is capable of making decisions on her own behalf.   6. Skin/Wound Care: Routine skin care checks   7. Fluids/Electrolytes/Nutrition: Routine Is and Os and follow-up chemistries             -continue B12 supplement daily   8: Hypertension: monitor TID and prn   9: Hypothyroidism: continue Synthroid   10: CKD: baseline creatinine ~2.0                 Latest Ref Rng & Units 03/01/2023    5:46 AM 02/24/2023    5:14 AM 02/23/2023    4:58 AM  BMP  Glucose 70 - 99 mg/dL 87  87    BUN 8 - 23 mg/dL 26  21    Creatinine 1.61 - 1.00 mg/dL 0.96  0.45  4.09   Sodium 135 - 145 mmol/L 138  137    Potassium 3.5 - 5.1 mmol/L 4.9  3.9    Chloride 98 - 111 mmol/L 107  109    CO2 22 - 32 mmol/L 24  21    Calcium 8.9 - 10.3 mg/dL 9.9  9.6     BUN slightly elevated as well will enc po fluid , recheck BMP on Monday    11: Chronic anemia: H and H stable; follow-upHgb improving     Latest Ref Rng & Units 03/01/2023    5:46 AM 02/24/2023    5:14 AM 02/23/2023    4:58 AM  CBC  WBC 4.0 - 10.5 K/uL 6.1  6.0  6.8    Hemoglobin 12.0 - 15.0 g/dL 16.1  09.6  9.9   Hematocrit 36.0 - 46.0 % 34.8  33.8  31.5   Platelets 150 - 400 K/uL 253  222  220       12. Morbid obesity: provide dietary education   13.  Severe glenohumeral osteoarthritis will consider repeat steroid injection if this interferes with OT/PT  RIght shoulder pain with elevation , chronic, Sports med visit 01/20/20 Hx suboccipital decompression for Chiari malformation 2002  C spine MRI 2020,  no cervical spinal stenosis [place] [facility] severe glenohumeral osteoarthritis  SHe reportedly responded well to Right glenohumeral steroid injection  LOS: 3 days A FACE TO FACE EVALUATION WAS PERFORMED  Erick Colace 03/03/2023, 9:17 AM

## 2023-03-03 NOTE — Plan of Care (Signed)
  Problem: RH SAFETY Goal: RH STG ADHERE TO SAFETY PRECAUTIONS W/ASSISTANCE/DEVICE Description: STG Adhere to Safety Precautions With cues Assistance/Device. Outcome: Progressing   Problem: Consults Goal: RH STROKE PATIENT EDUCATION Description: See Patient Education module for education specifics  Outcome: Adequate for Discharge   Problem: RH KNOWLEDGE DEFICIT Goal: RH STG INCREASE KNOWLEDGE OF DIABETES Description: Patient will be able to manage prediabetes using educational resources for dietary modifications independently Outcome: Adequate for Discharge Goal: RH STG INCREASE KNOWLEDGE OF HYPERTENSION Description: Patient will be able to manage HTN using educational resources for dietary modifications independently Outcome: Adequate for Discharge Goal: RH STG INCREASE KNOWLEGDE OF HYPERLIPIDEMIA Description: Patient will be able to manage HLD using educational resources for medications and  dietary modifications independently Outcome: Adequate for Discharge Goal: RH STG INCREASE KNOWLEDGE OF STROKE PROPHYLAXIS Description: Patient will be able to manage secondary risks using educational resources for medications and  dietary modifications independently Outcome: Adequate for Discharge

## 2023-03-03 NOTE — Progress Notes (Signed)
Physical Therapy Session Note  Patient Details  Name: Lynn Ray MRN: [id number] Date of Birth: [birth year]  Today's Date: 03/03/2023 PT Individual Time: 1120-1206 PT Individual Time Calculation (min): 46 min   Short Term Goals: Week 1:  PT Short Term Goal 1 (Week 1): = to LTGs based on ELOS  Skilled Therapeutic Interventions/Progress Updates:   Pt received sitting in recliner chair. Reports no pain. Agreeable to PT tx.  GT: Gait without AD x 228 ft x 2 with PT CGA and verbal cues for posture, decreasing cadence and increased foot clearance R due to her dragging foot esp when fatigued.  Therex: Bridge x 20; SLR x 20; SLR/hip abdct combo x 20; standing step taps to top of cone x 20 to increase single limb functional strengthening and balance. Airex: 1 lb therabar shoulder flexion/ext x20 followed by bicep curls x 20; alternating shoulder punch x 1 min; minisquats x 20. Lateral side steps x 19 ft 2 bouts each direction with PT CGA and verbal cues for R foot clearance.  All TE performed for strengthening to assist with gait. Pt returned to recliner in room. Seat belt alarm on. No complaints of pain.      Therapy Documentation Precautions:  Precautions Precautions: Fall, Other (comment) Precaution Comments: very mild R hemiparesis, HOH (hearing aids) Restrictions Weight Bearing Restrictions Per Provider Order: No    Therapy/Group: Individual Therapy  Luna Fuse 03/03/2023, 7:30 AM

## 2023-03-03 NOTE — Progress Notes (Signed)
Physical Therapy Session Note  Patient Details  Name: Lynn Ray MRN: [id number] Date of Birth: Jun 04, 1934  Today's Date: 03/03/2023 PT Individual Time: [phone] and 3244-0102 PT Individual Time Calculation (min): 55 min and 44 min  Short Term Goals: Week 1:  PT Short Term Goal 1 (Week 1): = to LTGs based on ELOS  Skilled Therapeutic Interventions/Progress Updates:    Session 1: Pt received supine in bed resting, but easily awakens and is agreeable to therapy session. Pt's dtr, Gunnar Fusi, is present. Supine>sitting L EOB, HOB partially elevated and using bedrail, mod-I. Sitting EOB donned clothes (UB and LB) set-up assist and shoes total A for time management. Sit<>stands, no AD, with CGA throughout session.  Gait training ~12ft, no AD, with CGA assist for steadying to main therapy gym- continues to have bilateral shuffle gait pattern with poor foot clearance - cuing for improvement and pt reports it is a "habit."  Gait training ~161ft, no AD, with CGA intermittent light min A for steadying balance with focus on fast walking with larger step lengths and heel strike on initial contact bilaterally.   Stair navigation training ascending/descending 12 steps (6" height) using B HRs then only 1 HR (L x1 and R x1) with CGA/light min assist for steadying - step-to pattern leading with R LE on ascent and L LE on descent.   HR 108bpm decreasing to 96bpm with seated rest break  Participated in high-intensity dynamic gait and dynamic balance task in a circuit fashion including the following:  Alternating heel taps to 6" step 2x 10 reps Reciprocal stepping pattern through agility ladder Forward diagonal steps to target with UE reaching to external target to add oculomotor/visual scanning task   Requires CGA occasional light min A for steadying HR 105bpm decreasing to 93bpm with seated rest break   Increased intensity by including 2.5lb ankle weights on B LEs   Repeated above circuit but with the  added challenges to the above numbered items: 2. Side stepping through agility ladder  3. Added walking sticks to step forward/backwards over when performing the above forward diagonal steps  Continues to require CGA with occasional light min A for balance - often has minor LOB to L when not stepping that LE to keep wider BOS.  Repeated the 3rd dynamic stepping balance task above with increased repetition - pt frequently reaching to use UE support for steadying - CGA for balance.  Gait training back to room with cuing for faster gait speed, longer step lengths bilaterally and increased heel strike - today tends to shuffle L foot more.  Pt left seated on EOB with needs in reach and bed alarm on.   Session 2: Pt received sitting in recliner and agreeable to therapy session. Sit<>stands, no AD, with CGA progressing to close SBA. Stand pivot recliner>w/c, no AD, with CGA. Notified nurse and transported pt outside in w/c.  Therapy session focused on community level gait training without AD. Gait training ~157ft outside, no AD, on paved unlevel concrete with CGA for steadying - slow gait speed and slight balance instability throughout but no overt LOB.   Gait training ~124ft through gift shop with dual-task of visually scanning, turning head, and navigating around obstacles with CGA for steadying - continues to have slow gait speed with no overt shuffling gait nor LOB, and improved balance in this environment compared to outside; however, pt also frequently pausing to look at items.  Gait training ~129ft back up to room including navigating on/off elevator and  pt with good recall on how to get back to room after therapist provided verbal instructions - continued cuing to avoid shuffle gait pattern and pt with good carryover.  Pt left seated on EOB with needs in reach and bed alarm on.    Therapy Documentation Precautions:  Precautions Precautions: Fall, Other (comment) Precaution Comments:  very mild R hemiparesis, HOH (hearing aids) Restrictions Weight Bearing Restrictions Per Provider Order: No   Pain:  Session 1: Reports low back discomfort or "tightness" that improves with seated rest breaks during session.  Session 2: Continues to have some back discomfort stating it was "warn out" from earlier therapy sessions, but was utilizing a heating pad on therapist arrival and reports that had helped it "calm down" now.    Therapy/Group: Individual Therapy  Ginny Forth , PT, DPT, NCS, CSRS 03/03/2023, 7:44 AM

## 2023-03-04 DIAGNOSIS — I63133 Cerebral infarction due to embolism of bilateral carotid arteries: Secondary | ICD-10-CM | POA: Diagnosis not present

## 2023-03-04 NOTE — Progress Notes (Signed)
PROGRESS NOTE   Subjective/Complaints:  Well motivated for therapy, no complaints of shoulder pain today , put her makeup on independently today   Review of systems negative chest pain shortness of breath nausea vomiting diarrhea or constipation.  Objective:   No results found. No results for input(s): "WBC", "HGB", "HCT", "PLT" in the last 72 hours.  No results for input(s): "NA", "K", "CL", "CO2", "GLUCOSE", "BUN", "CREATININE", "CALCIUM" in the last 72 hours.   Intake/Output Summary (Last 24 hours) at 03/04/2023 0951 Last data filed at 03/04/2023 0841 Gross per 24 hour  Intake 760 ml  Output --  Net 760 ml        Physical Exam: Vital Signs Blood pressure (!) 112/48, pulse 74, temperature 97.9 F (36.6 C), resp. rate 17, height 4' 11.5" (1.511 m), weight 80.2 kg, SpO2 100%.   General: No acute distress Mood and affect are appropriate Heart: Regular rate and rhythm no rubs murmurs or extra sounds Lungs: Clear to auscultation, breathing unlabored, no rales or wheezes Abdomen: Positive bowel sounds, soft nontender to palpation, nondistended Extremities: No clubbing, cyanosis, or edema Skin: No evidence of breakdown, no evidence of rash Neurologic: Cranial nerves II through XII intact, motor strength is 5/5 in bilateral deltoid, bicep, tricep, grip, hip flexor, knee extensors, ankle dorsiflexor and plantar flexor Sensory exam normal sensation to light touch in bilateral upper and lower extremities Cerebellar exam normal finger to nose to finger as well as heel to shin in bilateral upper and lower extremities Musculoskeletal: Full range of motion lower extremities. No joint swelling  Pain limiting right shoulder elevation  Assessment/Plan: 1. Functional deficits which require 3+ hours per day of interdisciplinary therapy in a comprehensive inpatient rehab setting. Physiatrist is providing close team supervision and 24  hour management of active medical problems listed below. Physiatrist and rehab team continue to assess barriers to discharge/monitor patient progress toward functional and medical goals  Care Tool:  Bathing    Body parts bathed by patient: Right arm, Left arm, Chest, Abdomen, Front perineal area, Buttocks, Right upper leg, Left upper leg, Right lower leg, Left lower leg, Face         Bathing assist Assist Level: Contact Guard/Touching assist     Upper Body Dressing/Undressing Upper body dressing   What is the patient wearing?: Pull over shirt, Bra    Upper body assist Assist Level: Minimal Assistance - Patient > 75%    Lower Body Dressing/Undressing Lower body dressing      What is the patient wearing?: Incontinence brief, Pants     Lower body assist Assist for lower body dressing: Minimal Assistance - Patient > 75%     Toileting Toileting    Toileting assist Assist for toileting: Minimal Assistance - Patient > 75%     Transfers Chair/bed transfer  Transfers assist     Chair/bed transfer assist level: Contact Guard/Touching assist     Locomotion Ambulation   Ambulation assist      Assist level: Contact Guard/Touching assist Assistive device: No Device Max distance: >370ft   Walk 10 feet activity   Assist     Assist level: Contact Guard/Touching assist Assistive device: No Device  Walk 50 feet activity   Assist    Assist level: Contact Guard/Touching assist Assistive device: No Device    Walk 150 feet activity   Assist Walk 150 feet activity did not occur: Safety/medical concerns  Assist level: Contact Guard/Touching assist Assistive device: No Device    Walk 10 feet on uneven surface  activity   Assist Walk 10 feet on uneven surfaces activity did not occur: Safety/medical concerns         Wheelchair     Assist Is the patient using a wheelchair?: Yes (for transport for energy conservation) Type of Wheelchair:  Manual    Wheelchair assist level: Dependent - Patient 0%      Wheelchair 50 feet with 2 turns activity    Assist        Assist Level: Dependent - Patient 0%   Wheelchair 150 feet activity     Assist      Assist Level: Dependent - Patient 0%   Blood pressure (!) 112/48, pulse 74, temperature 97.9 F (36.6 C), resp. rate 17, height 4' 11.5" (1.511 m), weight 80.2 kg, SpO2 100%.  Medical Problem List and Plan: 1. Functional deficits secondary to acute right frontal and left thalamic CVA             -patient may shower             -ELOS/Goals: 7 days modI             Admit to CIR   2.  Antithrombotics: -DVT/anticoagulation:  Pharmaceutical: Lovenox 30 mg daily             -antiplatelet therapy: continue Aspirin and Plavix for three weeks followed by aspirin alone   3. Pain Management: Tylenol as needed  4. Mood/Behavior/Sleep: LCSW to evaluate and provide emotional support             -antipsychotic agents: n/a   5. Neuropsych/cognition: This patient is capable of making decisions on her own behalf.   6. Skin/Wound Care: Routine skin care checks   7. Fluids/Electrolytes/Nutrition: Routine Is and Os and follow-up chemistries             -continue B12 supplement daily   8: Hypertension: monitor TID and prn   9: Hypothyroidism: continue Synthroid   10: CKD: baseline creatinine ~2.0                 Latest Ref Rng & Units 03/01/2023    5:46 AM 02/24/2023    5:14 AM 02/23/2023    4:58 AM  BMP  Glucose 70 - 99 mg/dL 87  87    BUN 8 - 23 mg/dL 26  21    Creatinine 1.61 - 1.00 mg/dL 0.96  0.45  4.09   Sodium 135 - 145 mmol/L 138  137    Potassium 3.5 - 5.1 mmol/L 4.9  3.9    Chloride 98 - 111 mmol/L 107  109    CO2 22 - 32 mmol/L 24  21    Calcium 8.9 - 10.3 mg/dL 9.9  9.6     BUN slightly elevated as well will enc po fluid , recheck BMP on Monday    11: Chronic anemia: H and H stable; follow-upHgb improving     Latest Ref Rng & Units 03/01/2023    5:46  AM 02/24/2023    5:14 AM 02/23/2023    4:58 AM  CBC  WBC 4.0 - 10.5 K/uL 6.1  6.0  6.8   Hemoglobin  12.0 - 15.0 g/dL 40.9  81.1  9.9   Hematocrit 36.0 - 46.0 % 34.8  33.8  31.5   Platelets 150 - 400 K/uL 253  222  220       12. Morbid obesity: provide dietary education   13.  Severe glenohumeral osteoarthritis will consider repeat steroid injection if this interferes with OT/PT  RIght shoulder pain with elevation , chronic, Sports med visit 01/20/20 Hx suboccipital decompression for Chiari malformation 2002  C spine MRI 2020,  no cervical spinal stenosis 12/22/19 [facility]- severe glenohumeral osteoarthritis  SHe reportedly responded well to Right glenohumeral steroid injection  LOS: 4 days A FACE TO FACE EVALUATION WAS PERFORMED  Erick Colace 03/04/2023, 9:51 AM

## 2023-03-05 ENCOUNTER — Ambulatory Visit: Payer: Medicare PPO | Admitting: Internal Medicine

## 2023-03-05 DIAGNOSIS — D649 Anemia, unspecified: Secondary | ICD-10-CM

## 2023-03-05 DIAGNOSIS — N184 Chronic kidney disease, stage 4 (severe): Secondary | ICD-10-CM

## 2023-03-05 DIAGNOSIS — I10 Essential (primary) hypertension: Secondary | ICD-10-CM

## 2023-03-05 DIAGNOSIS — I63133 Cerebral infarction due to embolism of bilateral carotid arteries: Secondary | ICD-10-CM | POA: Diagnosis not present

## 2023-03-05 LAB — BASIC METABOLIC PANEL
Anion gap: 5 (ref 5–15)
BUN: 37 mg/dL — ABNORMAL HIGH (ref 8–23)
CO2: 22 mmol/L (ref 22–32)
Calcium: 9.8 mg/dL (ref 8.9–10.3)
Chloride: 109 mmol/L (ref 98–111)
Creatinine, Ser: 2.48 mg/dL — ABNORMAL HIGH (ref 0.44–1.00)
GFR, Estimated: 18 mL/min — ABNORMAL LOW (ref >60)
Glucose, Bld: 95 mg/dL (ref 70–99)
Potassium: 4.8 mmol/L (ref 3.5–5.1)
Sodium: 136 mmol/L (ref 135–145)

## 2023-03-05 LAB — CBC
HCT: 31.4 % — ABNORMAL LOW (ref 36.0–46.0)
Hemoglobin: 10 g/dL — ABNORMAL LOW (ref 12.0–15.0)
MCH: 28 pg (ref 26.0–34.0)
MCHC: 31.8 g/dL (ref 30.0–36.0)
MCV: 88 fL (ref 80.0–100.0)
Platelets: 252 10*3/uL (ref 150–400)
RBC: 3.57 MIL/uL — ABNORMAL LOW (ref 3.87–5.11)
RDW: 13.1 % (ref 11.5–15.5)
WBC: 5.8 10*3/uL (ref 4.0–10.5)
nRBC: 0 % (ref 0.0–0.2)

## 2023-03-05 MED ORDER — SODIUM CHLORIDE 0.9 % IV SOLN
INTRAVENOUS | Status: DC
Start: 2023-03-05 — End: 2023-03-09

## 2023-03-05 NOTE — Progress Notes (Signed)
Occupational Therapy Session Note  Patient Details  Name: Lynn Ray MRN: [id number] Date of Birth: 07/01/1934  Today's Date: 03/05/2023 OT Individual Time: 8657-8469 OT Individual Time Calculation (min): 30 min    Short Term Goals: Week 1:  OT Short Term Goal 1 (Week 1): STG = LTG due to ELOS  Skilled Therapeutic Interventions/Progress Updates:      Therapy Documentation Precautions:  Precautions Precautions: Fall, Other (comment) Precaution Comments: very mild R hemiparesis, HOH (hearing aids) Restrictions Weight Bearing Restrictions Per Provider Order: No General: "I like this!" Pt supine in bed upon OT arrival, agreeable to OT session.  Pain: no pain reported  ADL: Pt able to complete supine><EOB at SBA with flat bed. OT assisted to don shoes total A for time management. Pt then able to ambulate CGA from room><day room with one seated rest break.  Exercises:  Pt recieved foam block hand home exercise program in order to increase strength/ROM of Lt hand. Pt educated on purpose of exercises and reps/sets to complete. Pt given blue foam block and instructed on the following exercises:  -power grip -key pinch -pincer grasp -three finger pinch -thumb flexion -PIP grip    Pt supine in bed with bed alarm activated, 2 bed rails up, call light within reach and 4Ps assessed.    Therapy/Group: Individual Therapy  Velia Meyer, OTD, OTR/L 03/05/2023, 3:52 PM

## 2023-03-05 NOTE — Progress Notes (Signed)
PROGRESS NOTE   Subjective/Complaints:  No acute events noted overnight. Had a soft BM this morning.  She reports pain is under control.  No additional concerns this morning.  Review of systems negative fever, chills, chest pain shortness of breath nausea vomiting diarrhea or constipation.  Objective:   No results found. Recent Labs    03/05/23 0522  WBC 5.8  HGB 10.0*  HCT 31.4*  PLT 252    Recent Labs    03/05/23 0522  NA 136  K 4.8  CL 109  CO2 22  GLUCOSE 95  BUN 37*  CREATININE 2.48*  CALCIUM 9.8     Intake/Output Summary (Last 24 hours) at 03/05/2023 1756 Last data filed at 03/05/2023 1324 Gross per 24 hour  Intake 720 ml  Output --  Net 720 ml        Physical Exam: Vital Signs Blood pressure (!) 122/33, pulse 85, temperature 97.9 F (36.6 C), temperature source Oral, resp. rate 18, height 4' 11.5" (1.511 m), weight 80.2 kg, SpO2 100%.   General: No acute distress Mood and affect are appropriate Heart: Regular rate and rhythm no rubs murmurs or extra sounds Lungs: Clear to auscultation, breathing unlabored, on room air Abdomen: Positive bowel sounds, soft nontender to palpation, nondistended Extremities: No clubbing, cyanosis, or edema Skin: No evidence of breakdown, no evidence of rash Neurologic: Cranial nerves II through XII grossly intact, motor strength is 5/5 in bilateral deltoid, bicep, tricep, grip, hip flexor, knee extensors, ankle dorsiflexor and plantar flexor Sensory exam normal sensation to light touch in bilateral upper and lower extremities  Musculoskeletal: No joint swelling  Pain limiting right shoulder elevation-  Assessment/Plan: 1. Functional deficits which require 3+ hours per day of interdisciplinary therapy in a comprehensive inpatient rehab setting. Physiatrist is providing close team supervision and 24 hour management of active medical problems listed  below. Physiatrist and rehab team continue to assess barriers to discharge/monitor patient progress toward functional and medical goals  Care Tool:  Bathing    Body parts bathed by patient: Right arm, Left arm, Chest, Abdomen, Front perineal area, Buttocks, Right upper leg, Left upper leg, Right lower leg, Left lower leg, Face         Bathing assist Assist Level: Contact Guard/Touching assist     Upper Body Dressing/Undressing Upper body dressing   What is the patient wearing?: Pull over shirt, Bra    Upper body assist Assist Level: Minimal Assistance - Patient > 75%    Lower Body Dressing/Undressing Lower body dressing      What is the patient wearing?: Incontinence brief, Pants     Lower body assist Assist for lower body dressing: Minimal Assistance - Patient > 75%     Toileting Toileting    Toileting assist Assist for toileting: Minimal Assistance - Patient > 75%     Transfers Chair/bed transfer  Transfers assist     Chair/bed transfer assist level: Contact Guard/Touching assist     Locomotion Ambulation   Ambulation assist      Assist level: Contact Guard/Touching assist Assistive device: No Device Max distance: >340ft   Walk 10 feet activity   Assist  Assist level: Contact Guard/Touching assist Assistive device: No Device   Walk 50 feet activity   Assist    Assist level: Contact Guard/Touching assist Assistive device: No Device    Walk 150 feet activity   Assist Walk 150 feet activity did not occur: Safety/medical concerns  Assist level: Contact Guard/Touching assist Assistive device: No Device    Walk 10 feet on uneven surface  activity   Assist Walk 10 feet on uneven surfaces activity did not occur: Safety/medical concerns         Wheelchair     Assist Is the patient using a wheelchair?: Yes (for transport for energy conservation) Type of Wheelchair: Manual    Wheelchair assist level: Dependent - Patient  0%      Wheelchair 50 feet with 2 turns activity    Assist        Assist Level: Dependent - Patient 0%   Wheelchair 150 feet activity     Assist      Assist Level: Dependent - Patient 0%   Blood pressure (!) 122/33, pulse 85, temperature 97.9 F (36.6 C), temperature source Oral, resp. rate 18, height 4' 11.5" (1.511 m), weight 80.2 kg, SpO2 100%.  Medical Problem List and Plan: 1. 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  Antithrombotics: -DVT/anticoagulation:  Pharmaceutical: Lovenox 30 mg daily             -antiplatelet therapy: continue Aspirin and Plavix for three weeks followed by aspirin alone   3. Pain Management: Tylenol as needed  4. Mood/Behavior/Sleep: LCSW to evaluate and provide emotional support             -antipsychotic agents: n/a   5. Neuropsych/cognition: This patient is capable of making decisions on her own behalf.   6. Skin/Wound Care: Routine skin care checks   7. Fluids/Electrolytes/Nutrition: Routine Is and Os and follow-up chemistries             -continue B12 supplement daily   8: Hypertension: monitor TID and prn     03/05/2023    1:21 PM 03/05/2023    5:37 AM 03/04/2023    8:23 PM  Vitals with BMI  Systolic 122 120 696  Diastolic 33 50 52  Pulse 85 77 74   2/3 BP stable, continue to monitor    9: Hypothyroidism: continue Synthroid   10: CKD: baseline creatinine ~2.0                 Latest Ref Rng & Units 03/05/2023    5:22 AM 03/01/2023    5:46 AM 02/24/2023    5:14 AM  BMP  Glucose 70 - 99 mg/dL 95  87  87   BUN 8 - 23 mg/dL 37  26  21   Creatinine 0.44 - 1.00 mg/dL 2.95  2.84  1.32   Sodium 135 - 145 mmol/L 136  138  137   Potassium 3.5 - 5.1 mmol/L 4.8  4.9  3.9   Chloride 98 - 111 mmol/L 109  107  109   CO2 22 - 32 mmol/L 22  24  21    Calcium 8.9 - 10.3 mg/dL 9.8  9.9  9.6  BUN slightly elevated as well will enc po fluid , recheck BMP on Monday   -2/3 trending higher at BUN/creatinine 37/2.48, will start IVF NS 38ml/HR   11: Chronic anemia: H and H stable; follow-upHgb improving     Latest Ref Rng & Units 03/05/2023    5:22 AM 03/01/2023    5:46 AM 02/24/2023    5:14 AM  CBC  WBC 4.0 - 10.5 K/uL 5.8  6.1  6.0   Hemoglobin 12.0 - 15.0 g/dL 16.1  09.6  04.5   Hematocrit 36.0 - 46.0 % 31.4  34.8  33.8   Platelets 150 - 400 K/uL 252  253  222   2/3 hemoglobin a little lower at 10.0, appears overall stable   12. Morbid obesity: provide dietary education   13.  Severe glenohumeral osteoarthritis will consider repeat steroid injection if this interferes with OT/PT  RIght shoulder pain with elevation , chronic, Sports med visit 01/20/20 Hx suboccipital decompression for Chiari malformation 2002  C spine MRI 2020,  no cervical spinal stenosis 12/22/19 [facility]- severe glenohumeral osteoarthritis  SHe reportedly responded well to Right glenohumeral steroid injection   LOS: 5 days A FACE TO FACE EVALUATION WAS PERFORMED  Fanny Dance 03/05/2023, 5:56 PM

## 2023-03-05 NOTE — Progress Notes (Addendum)
Speech Language Pathology Daily Session Note  Patient Details  Name: Lynn Ray MRN: [id number] Date of Birth: [birth year]  Today's Date: 03/05/2023 SLP Individual Time: 0830-0916 SLP Individual Time Calculation (min): 46 min  Short Term Goals: Week 1: SLP Short Term Goal 1 (Week 1): STG = LTG due to ELOS  Skilled Therapeutic Interventions:   Pt greeted at bedside, awake and alert at EOB. She was pleasant and cooperative throughout tx tasks, greeting SLP with a smile upon her arrival. SLP facilitated a variety of tx tasks targeting cognitive-linguistic skills. She independently recalled broad information re events of the morning/last few days. She benefited from spv cues for complex problem solving during conversational task re stroke education, stroke prevention, and planning for assistance in the home environment upon d/c. She then completed abstract generative naming task (3-5 items). She initially required s verbal semantic cues, though minA required as task continued. She was independently able to recall 4/5 details provided re stroke edu after ~10 min delay. At the end of tx tasks, she was left sitting EOB with bed alarm set and call light within reach. Recommend cont ST per POC.   Pain  No pain reported.   Therapy/Group: Individual Therapy  [name] 03/05/2023, 8:25 AM

## 2023-03-05 NOTE — Progress Notes (Signed)
Occupational Therapy Session Note  Patient Details  Name: Lynn Ray MRN: [id number] Date of Birth: 1934/12/26  Today's Date: 03/05/2023 OT Individual Time: 4098-1191 OT Individual Time Calculation (min): 43 min    Short Term Goals: Week 1:  OT Short Term Goal 1 (Week 1): STG = LTG due to ELOS  Skilled Therapeutic Interventions/Progress Updates:  Pt greeted seated EOB, pt agreeable to OT intervention.      Transfers/bed mobility/functional mobility: pt completed all functional mobility with no AD and CGA. Pt completed ambulatory transfers in apt with CGA to flat HOB, recliner and couch with no AD. Pt completed functional mobility to TTB with pt completing transfer with CGA, pt agreeable to TTB for home.   Therapeutic activity: pt completed lateral stepping tasks in // bars with pt instructed to step over cones to R and L side to simulate stepping over shower and to challenge dynamic balance. Pt completed task with CGA with BUE support, MIN verbal cues needed for sequencing and technique.    IADLS:pt completed functional reaching in kitchen with pt able to reach out of BOS to load dishes in dishwasher and reach items OH in cabinets. Pt completed task with CGA no LOB noted. Pt additionally completed bed making task as pt reports she makes her bed every morning. Pt completed task with close supervision.     Exercises: pt completed x10 blocked practice of sit>stands from EOM with an emphasis on only using BLEs to stand up, graded task up and has pt hold 4 lb weighted ball to challenge strength/endurance for higher level functional mobility tasks. Pt able to stand and completed x10 chest presses with 4 lb weighted ball. Pt completed tasks with CGA no LOB.   Ended session with pt seated in recliner with all needs within reach.                   Therapy Documentation Precautions:  Precautions Precautions: Fall, Other (comment) Precaution Comments: very mild R hemiparesis, HOH (hearing  aids) Restrictions Weight Bearing Restrictions Per Provider Order: No Pain: No pain reported during session    Therapy/Group: Individual Therapy  Barron Schmid 03/05/2023, 12:13 PM

## 2023-03-05 NOTE — Progress Notes (Signed)
Occupational Therapy Session Note  Patient Details  Name: Lynn Ray MRN: [id number] Date of Birth: 1934/05/26  Today's Date: 03/05/2023 OT Individual Time: 0800-0828 OT Individual Time Calculation (min): 28 min    Short Term Goals: Week 1:  OT Short Term Goal 1 (Week 1): STG = LTG due to ELOS  Skilled Therapeutic Interventions/Progress Updates:    Pt received supine with no c/o pain, agreeable to OT session. She came to EOB with mod I, OT provided hearing aids. Socks and shoes donned max A EOB 2/2 time constraints. She completed functional mobility into the bathroom with CGA. Toileting tasks with CGA, including standing level peri hygiene following BM. Rest break required EOB following. She then completed grooming tasks at the sink in standing with close (S). Discussed home energy conservation strategies during ADLs at home, as well as safety precautions to consider when living alone- like carrying a cell phone on her at all times and provided recommendation to use lanyard type case to hang around her neck which pt reported liking the idea of. She did require UE leaning on the sink 2/2 fatigue but was able to stand for almost 10 min. She dressed from EOB- mod I to don shirt, OT assisted with pants 2/2 time constraint. She was left sitting EOB with bed alarm set, all needs met.     Therapy Documentation Precautions:  Precautions Precautions: Fall, Other (comment) Precaution Comments: very mild R hemiparesis, HOH (hearing aids) Restrictions Weight Bearing Restrictions Per Provider Order: No   Therapy/Group: Individual Therapy  Crissie Reese 03/05/2023, 6:31 AM

## 2023-03-06 DIAGNOSIS — I63133 Cerebral infarction due to embolism of bilateral carotid arteries: Secondary | ICD-10-CM | POA: Diagnosis not present

## 2023-03-06 NOTE — Progress Notes (Signed)
 Physical Therapy Session Note  Patient Details  Name: Lynn Ray MRN: [id number] Date of Birth: 12-20-1934  Today's Date: 03/06/2023 PT Individual Time: 0915-0945 PT Individual Time Calculation (min): 30 min   Short Term Goals: Week 1:  PT Short Term Goal 1 (Week 1): = to LTGs based on ELOS  Skilled Therapeutic Interventions/Progress Updates:    Session focused on functional transfers, gait without device, and stair negotiation for home access. Pt performed sit <> stand from EOB to don extra gown with S and CGA for balance during turns and transfers. Functional gait training without device in hallway > 200' with overall CGA with somewhat antalgic gait and lateral leans. Stair negotiation training with single rail for home access x 8 steps and then again x 4 with seated rest break in between with overall CGA. Pt discussed home set up and reports not using stairs often when alone. She also states she has a [employer] for safety as well.Returned back to room and sat EOB with supervision and all needs in reach. Decreased step length noted as fatigued.   Therapy Documentation Precautions:  Precautions Precautions: Fall, Other (comment) Precaution Comments: very mild R hemiparesis, HOH (hearing aids) Restrictions Weight Bearing Restrictions Per Provider Order: No    Pain: Reports slight headache at end of session. No intervention needed but educated her to tell RN if thinks she needed pain medication. C/o mild L knee discomfort this AM also.     Therapy/Group: Individual Therapy  Elnor Pizza Sherrell Pizza WENDI Elnor, PT, DPT, CBIS  03/06/2023, 9:46 AM

## 2023-03-06 NOTE — Progress Notes (Signed)
Physical Therapy Session Note  Patient Details  Name: Lynn Ray MRN: [id number] Date of Birth: 1934/10/24  Today's Date: 03/05/2023 PT Individual Time: 1116-1200  PT Individual Time Calculation (min): 44 min  Short Term Goals: Week 1:  PT Short Term Goal 1 (Week 1): = to LTGs based on ELOS  Skilled Therapeutic Interventions/Progress Updates:  Patient seated upright in recliner with BLE elevated on entrance to room. Patient alert and agreeable to PT session. Relates discomfort with legs elevated and difficulty lowering recliner.   Patient with no pain complaint at start of session. But does relate discomfort with LE in elevated position for extended time.   Therapeutic Activity: Transfers: Pt performed sit<>stand and stand pivot transfers throughout session with supervision/ CGA. Provided vc/ tc for forward scoot in order to decrease knee extension into seated surface for assist in rise to stand.   Neuromuscular Re-ed: NMR facilitated during session with focus on dynamic standing balance, cognition/ problem solving, awareness. Pt guided in toe touches to 6" step with intermittent reach out to therapist's hand for steadying. Progressed to reciprocal step ups which she is able to perform initially with HHA but improves to no UE support. No LOB.   Then guided in activity to create foam lego build according to picture. Colors blocks separated and placed on tables to each side of pt and outside of BOS. Pt guided initially with vc for number of color blocks required and collection, then build according to picture. Level 1 picture performed perfectly with minimal cues for build. Level 2 picture with front facing picture and pt also completes well with minimal cues. Next level 2 picture with skewed reference picture and initial incorrect placement of base blocks but is able to recognize mistake during build and makes correction with minimal cues.   One instance throughout activity of pt performing  crossover step over RLE and minimal stumble. Cued for lateral stepping rather than crossover stepping for safe repositioning to reach blocks. No other instance of lateral step.   NMR performed for improvements in motor control and coordination, balance, sequencing, judgement, and self confidence/ efficacy in performing all aspects of mobility at highest level of independence.   Gait Training:  Pt ambulated >150 x1 using no AD with CGA/ supervision to walk to therapy gym. Demonstrated slow pace with decreased step height/ length and slight increase in lateral sway. Provided vc/ tc for slight increase in pace and step height.  For return trip to room, pt ambulated with hold to 5# tidal tank and demos good ability to maintain/ correct hold of tank level and in front of her with minor cues. Reaches room and places on armchair. No LOB throughout.   Patient seated on EOB to set self up to eat lunch at end of session with brakes locked, bed alarm set, and all needs within reach.   Therapy Documentation Precautions:  Precautions Precautions: Fall, Other (comment) Precaution Comments: very mild R hemiparesis, HOH (hearing aids) Restrictions Weight Bearing Restrictions Per Provider Order: No  Pain:       Therapy/Group: Individual Therapy  Loel Dubonnet PT, DPT, CSRS 03/05/2023, 5:56 PM

## 2023-03-06 NOTE — Progress Notes (Signed)
 Occupational Therapy Session Note  Patient Details  Name: Lynn Ray MRN: [id number] Date of Birth: Nov 12, 1934  Today's Date: 03/06/2023 OT Individual Time: 8981-8885 OT Individual Time Calculation (min): 56 min    Short Term Goals: Week 1:  OT Short Term Goal 1 (Week 1): STG = LTG due to ELOS  Skilled Therapeutic Interventions/Progress Updates:     Pt received reclined in bed presenting to be in good spirits receptive to skilled OT session reporting 0/10 pain- OT offering intermittent rest breaks, repositioning, and therapeutic support to optimize participation in therapy session. Pt requesting to take shower this AM- focused session on BADL retraining to increase safety and independence in shower level BADLs. Pt completed functional mobility and ambulatory transfers this session with close supervision- Pt with decreased gait speed and mildly shuffles her feet, however no LOB noted. Pt doffed clothing in standing position using grab bars for balance close supervision. Pt chose to sit for majority of her shower for energy conservation, standing only to wash her buttocks using grab bars for balance completing U/LB bathing tasks with close supervision for safety. Following shower, U/LB dressing tasks completed seated in wc with close supervision +increased time for energy conservation. Pt demonstrating increased safety awareness this session- appropriately taking rest breaks when needed, utilizing grab bars for balance, and providing self increased time to complete tasks. Engaged Pt in completing grooming/hygiene tasks standing at sink for increased balance and activity tolerance challenge. Pt able to complete oral care, apply light make-up, and apply lotion with supervision, manipulating small toiletry items without dropping and using R UE at a dominant level during tasks. Pt was left resting in recliner with call bell in reach, chair alarm on, and all needs met.    Therapy  Documentation Precautions:  Precautions Precautions: Fall, Other (comment) Precaution Comments: very mild R hemiparesis, HOH (hearing aids) Restrictions Weight Bearing Restrictions Per Provider Order: No  Therapy/Group: Individual Therapy  Katheryn SHAUNNA Mines 03/06/2023, 7:58 AM

## 2023-03-06 NOTE — Progress Notes (Signed)
 PROGRESS NOTE   Subjective/Complaints:  No issues ovenite , discussed need to increase fluid intake  Review of systems negative fever, chills, chest pain shortness of breath nausea vomiting diarrhea or constipation.  Objective:   No results found. Recent Labs    03/05/23 0522  WBC 5.8  HGB 10.0*  HCT 31.4*  PLT 252    Recent Labs    03/05/23 0522  NA 136  K 4.8  CL 109  CO2 22  GLUCOSE 95  BUN 37*  CREATININE 2.48*  CALCIUM  9.8     Intake/Output Summary (Last 24 hours) at 03/06/2023 0845 Last data filed at 03/06/2023 9662 Gross per 24 hour  Intake 922.98 ml  Output --  Net 922.98 ml        Physical Exam: Vital Signs Blood pressure (!) 129/55, pulse 83, temperature 98.1 F (36.7 C), temperature source Oral, resp. rate 17, height 4' 11.5 (1.511 m), weight 80.2 kg, SpO2 100%.   General: No acute distress Mood and affect are appropriate Heart: Regular rate and rhythm no rubs murmurs or extra sounds Lungs: Clear to auscultation, breathing unlabored, on room air Abdomen: Positive bowel sounds, soft nontender to palpation, nondistended Extremities: No clubbing, cyanosis, or edema Skin: No evidence of breakdown, no evidence of rash Neurologic: Cranial nerves II through XII grossly intact, motor strength is 5/5 in bilateral deltoid, bicep, tricep, grip, hip flexor, knee extensors, ankle dorsiflexor and plantar flexor Sensory exam normal sensation to light touch in bilateral upper and lower extremities  Musculoskeletal: No joint swelling    Assessment/Plan: 1. Functional deficits which require 3+ hours per day of interdisciplinary therapy in a comprehensive inpatient rehab setting. Physiatrist is providing close team supervision and 24 hour management of active medical problems listed below. Physiatrist and rehab team continue to assess barriers to discharge/monitor patient progress toward functional and  medical goals  Care Tool:  Bathing    Body parts bathed by patient: Right arm, Left arm, Chest, Abdomen, Front perineal area, Buttocks, Right upper leg, Left upper leg, Right lower leg, Left lower leg, Face         Bathing assist Assist Level: Contact Guard/Touching assist     Upper Body Dressing/Undressing Upper body dressing   What is the patient wearing?: Pull over shirt, Bra    Upper body assist Assist Level: Minimal Assistance - Patient > 75%    Lower Body Dressing/Undressing Lower body dressing      What is the patient wearing?: Incontinence brief, Pants     Lower body assist Assist for lower body dressing: Minimal Assistance - Patient > 75%     Toileting Toileting    Toileting assist Assist for toileting: Minimal Assistance - Patient > 75%     Transfers Chair/bed transfer  Transfers assist     Chair/bed transfer assist level: Contact Guard/Touching assist     Locomotion Ambulation   Ambulation assist      Assist level: Contact Guard/Touching assist Assistive device: No Device Max distance: >336ft   Walk 10 feet activity   Assist     Assist level: Contact Guard/Touching assist Assistive device: No Device   Walk 50 feet activity   Assist  Assist level: Contact Guard/Touching assist Assistive device: No Device    Walk 150 feet activity   Assist Walk 150 feet activity did not occur: Safety/medical concerns  Assist level: Contact Guard/Touching assist Assistive device: No Device    Walk 10 feet on uneven surface  activity   Assist Walk 10 feet on uneven surfaces activity did not occur: Safety/medical concerns         Wheelchair     Assist Is the patient using a wheelchair?: Yes (for transport for energy conservation) Type of Wheelchair: Manual    Wheelchair assist level: Dependent - Patient 0%      Wheelchair 50 feet with 2 turns activity    Assist        Assist Level: Dependent - Patient 0%    Wheelchair 150 feet activity     Assist      Assist Level: Dependent - Patient 0%   Blood pressure (!) 129/55, pulse 83, temperature 98.1 F (36.7 C), temperature source Oral, resp. rate 17, height 4' 11.5 (1.511 m), weight 80.2 kg, SpO2 100%.  Medical Problem List and Plan: 1. 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  Antithrombotics: -DVT/anticoagulation:  Pharmaceutical: Lovenox  30 mg daily             -antiplatelet therapy: continue Aspirin  and Plavix  for three weeks followed by aspirin  alone   3. Pain Management: Tylenol  as needed  4. Mood/Behavior/Sleep: LCSW to evaluate and provide emotional support             -antipsychotic agents: n/a   5. Neuropsych/cognition: This patient is capable of making decisions on her own behalf.   6. Skin/Wound Care: Routine skin care checks   7. Fluids/Electrolytes/Nutrition: Routine Is and Os and follow-up chemistries             -continue B12 supplement daily   8: Hypertension: monitor TID and prn     03/06/2023    5:13 AM 03/05/2023    7:40 PM 03/05/2023    1:21 PM  Vitals with BMI  Systolic 129 116 877  Diastolic 55 43 33  Pulse 83 88 85   2/3 BP stable, continue to monitor    9: Hypothyroidism: continue Synthroid    10: CKD: baseline creatinine ~2.0                 Latest Ref Rng & Units 03/05/2023    5:22 AM 03/01/2023    5:46 AM 02/24/2023    5:14 AM  BMP  Glucose 70 - 99 mg/dL 95  87  87   BUN 8 - 23 mg/dL 37  26  21   Creatinine 0.44 - 1.00 mg/dL 7.51  7.79  8.16   Sodium 135 - 145 mmol/L 136  138  137   Potassium 3.5 - 5.1 mmol/L 4.8  4.9  3.9   Chloride 98 - 111 mmol/L 109  107  109   CO2 22 - 32 mmol/L 22  24  21    Calcium  8.9 - 10.3 mg/dL 9.8  9.9  9.6    BUN slightly elevated as well will enc po fluid , recheck BMP on Monday   -2/3 trending higher at BUN/creatinine  37/2.48, will start IVF NS 59ml/HR  recheck BMP in am , pt to cont IVF overnite  11: Chronic anemia: H and H stable; follow-upHgb improving     Latest Ref Rng & Units 03/05/2023    5:22 AM 03/01/2023    5:46 AM 02/24/2023    5:14 AM  CBC  WBC 4.0 - 10.5 K/uL 5.8  6.1  6.0   Hemoglobin 12.0 - 15.0 g/dL 89.9  88.8  89.2   Hematocrit 36.0 - 46.0 % 31.4  34.8  33.8   Platelets 150 - 400 K/uL 252  253  222   2/3 hemoglobin a little lower at 10.0, appears overall stable   12. Morbid obesity: provide dietary education   13.  Severe glenohumeral osteoarthritis will consider repeat steroid injection if this interferes with OT/PT  RIght shoulder pain with elevation , chronic, Sports med visit 01/20/20 Hx suboccipital decompression for Chiari malformation 2002  C spine MRI 2020,  no cervical spinal stenosis [place] [facility]- severe glenohumeral osteoarthritis  SHe reportedly responded well to Right glenohumeral steroid injection   LOS: 6 days A FACE TO FACE EVALUATION WAS PERFORMED  Lynn Ray 03/06/2023, 8:45 AM

## 2023-03-06 NOTE — Progress Notes (Signed)
 Speech Language Pathology Daily Session Note  Patient Details  Name: Lynn Ray MRN: [id number] Date of Birth: [birth year]  Today's Date: 03/06/2023 SLP Individual Time: 1300-1400 SLP Individual Time Calculation (min): 60 min  Short Term Goals: Week 1: SLP Short Term Goal 1 (Week 1): STG = LTG due to ELOS  Skilled Therapeutic Interventions: Skilled therapy session focused on communication and cognitive goals. At the beginning of the session, SLP reviewed stroke signs/symptoms taught prior day. Patient recalled 5/6 items given minA. SLP targeted communication goals through reviewing word finding strategies and encouraging use during word description task. Patient utilized primary school teacher (describe) to communicate vocabulary words to SLP with supervision-minA. Patient left in chair with alarm set and call bell in reach. Continue POC.   Pain None reported   Therapy/Group: Individual Therapy  Luria Rosario M.A., CF-SLP 03/06/2023, 7:47 AM

## 2023-03-07 DIAGNOSIS — N184 Chronic kidney disease, stage 4 (severe): Secondary | ICD-10-CM | POA: Diagnosis not present

## 2023-03-07 DIAGNOSIS — I63133 Cerebral infarction due to embolism of bilateral carotid arteries: Secondary | ICD-10-CM | POA: Diagnosis not present

## 2023-03-07 LAB — BASIC METABOLIC PANEL
Anion gap: 8 (ref 5–15)
BUN: 37 mg/dL — ABNORMAL HIGH (ref 8–23)
CO2: 19 mmol/L — ABNORMAL LOW (ref 22–32)
Calcium: 9.6 mg/dL (ref 8.9–10.3)
Chloride: 109 mmol/L (ref 98–111)
Creatinine, Ser: 2.33 mg/dL — ABNORMAL HIGH (ref 0.44–1.00)
GFR, Estimated: 20 mL/min — ABNORMAL LOW (ref >60)
Glucose, Bld: 87 mg/dL (ref 70–99)
Potassium: 4.8 mmol/L (ref 3.5–5.1)
Sodium: 136 mmol/L (ref 135–145)

## 2023-03-07 NOTE — Progress Notes (Signed)
 Physical Therapy Session Note  Patient Details  Name: Lynn Ray MRN: [id number] Date of Birth: [birth year]  Today's Date: 03/06/2023 PT Individual Time:  8567-8468  PT Individual Time Calculation (min): 59 min  Short Term Goals: Week 1:  PT Short Term Goal 1 (Week 1): = to LTGs based on ELOS  Skilled Therapeutic Interventions/Progress Updates:  Patient seated upright in recliner on entrance to room. Patient alert and agreeable to PT session. Relates new issue with use of bed remote for TV and call bell not working. Connections checked and NT notified. New callbell acquired and tested with same problems. RN notified. Pt with new Hill-ROM unit in room and is not yet calibrated/ programmed.   Patient with no pain complaint at start of session.  Therapeutic Activity: Transfers: Pt performed sit<>stand and stand pivot transfers throughout session with supervision. Provided vc/ tc for forward scoot in order to decrease pt's compensatory BLE extension into seated surface to find initial balance.   Gait Training:  Pt ambulated 180' x1 to ADL apartment and able to transfer to/ from couch with distant supervision. Then 160' x1 to dayroomt using no AD with supervision. Return trip to room with dual task of conversation re: family and d/c plans with no deviation to path but continued slow, consistent pace with slight increase in lateral sway. No cueing required for quality of gait.  Neuromuscular Re-ed: NMR facilitated during session with focus on standing balance, proactive/ reactive balance. Pt guided in stance on Airex and placement/ removal of Squigz on mirror from overhead to ankle height. Pt maintains balance with slow movements when moving into extreme reaches low/ high. Good arm strength noted in order to maintain foot placement on Airex. Ankle strategy to maintain balance throughout. NMR performed for improvements in motor control and coordination, balance, sequencing, judgement, and self  confidence/ efficacy in performing all aspects of mobility at highest level of independence.   Patient seated upright on EOB at end of session with brakes locked, bed alarm set, and all needs within reach.   Therapy Documentation Precautions:  Precautions Precautions: Fall, Other (comment) Precaution Comments: very mild R hemiparesis, HOH (hearing aids) Restrictions Weight Bearing Restrictions Per Provider Order: No  Pain:  No pain related this session.    Therapy/Group: Individual Therapy  [name] PT, DPT, CSRS 03/06/2023, 6:44 PM

## 2023-03-07 NOTE — Progress Notes (Signed)
 Occupational Therapy Session Note  Patient Details  Name: Lynn Ray MRN: [id number] Date of Birth: 05/21/1934  Today's Date: 03/07/2023 OT Individual Time: 9081-8971 OT Individual Time Calculation (min): 70 min    Short Term Goals: Week 1:  OT Short Term Goal 1 (Week 1): STG = LTG due to ELOS  Skilled Therapeutic Interventions/Progress Updates:     Pt received sitting EOB, dressed and ready for the day with all BADL needs met upon OT arrival. Pt presenting to be in good spirits receptive to skilled OT session reporting 0/10 pain- OT offering intermittent rest breaks, repositioning, and therapeutic support to optimize participation in therapy session. Focus this session functional transfer training, IADL retraining, and Pt education.   Mobility:  -Functional Mobility: Pt completed functional mobility <> therapy gym without AD during session with close supervision no LOB.  -TTB Transfer: Provided education on TTB technique with demonstration and verbal education provided- Pt receptive to education and verbalizing understanding. Engaged Pt in practicing TTB transfers to support learning and carryover with Pt able to complete with close supervision x2 trials.   Home Management:  -Engaged Pt in simulated home management towel folding task to work on reciprocal B UE movements and coordination and increase Pt's independence in IADLs. Education provided on energy conservation techniques and fall prevention as it relates to home management during activity with Pt receptive to education and applying techniques during task. Pt chose to fold towels in seated position using chair as a folding table to conserve energy. Pt then transported towels in laundry basket ~15 ft to cabinets and places towels in elevated cabinet with close supervision, no LOB noted. Pt able provide self increased time and identify locations to take rest breaks during activity if it were needed.  -Engaged Pt in simulated cleaning and  dishes tasks in therapy kitchen with Pt instructed to place large and small dishes in dish washer, retrieve items in from dishwasher, and place items into overhead cabinet. Pt able to safety position self around dishwasher and utilize stable countertop for balance- Pt demonstrating appropriate safety awareness and problem solving skills during IADLs this session.   Therapeutic Activity:  -Engaged Pt in completing picture replication activity in standing position at elevated table to work on standing tolerance, FM coordination/ manipulation, and finger tip<>palm translation. Pt able to maintain balance during task with supervision and complete moderate complexity picture replication with 100% accuracy and min dropping. Short seated rest break provided following. Pt then removed the small pegs using tweezers in R UE with min dropping in standing position, no AD.   Pt was left resting EOB with call bell in reach, bed alarm on, and all needs met.    Therapy Documentation Precautions:  Precautions Precautions: Fall, Other (comment) Precaution Comments: very mild R hemiparesis, HOH (hearing aids) Restrictions Weight Bearing Restrictions Per Provider Order: No General:   Vital Signs: Therapy Vitals Temp: 98 F (36.7 C) Pulse Rate: 80 Resp: 18 BP: (!) 126/57 Patient Position (if appropriate): Lying Oxygen Therapy SpO2: 100 % O2 Device: Room Air Pain:   ADL: ADL Eating: Set up Where Assessed-Eating: Edge of bed Grooming: Setup Where Assessed-Grooming: Edge of bed Upper Body Bathing: Supervision/safety Where Assessed-Upper Body Bathing: Shower Lower Body Bathing: Contact guard Where Assessed-Lower Body Bathing: Shower Upper Body Dressing: Minimal assistance Where Assessed-Upper Body Dressing: Other (Comment) (BSC) Lower Body Dressing: Minimal assistance Where Assessed-Lower Body Dressing: Other (Comment) (BSC) Toileting: Minimal assistance Where Assessed-Toileting: Public House Manager Method:  Ambulating Acupuncturist: Raised toilet seat Tub/Shower Transfer: Unable to assess Tub/Shower Transfer Method: Unable to assess Film/video Editor: Administrator, Arts Method: Designer, Industrial/product: Information systems manager with back, Engineer, Production   Exercises:   Other Treatments:     Therapy/Group: Individual Therapy  Katheryn SHAUNNA [place]ines 03/07/2023, 8:00 AM

## 2023-03-07 NOTE — Progress Notes (Signed)
 Physical Therapy Discharge Summary  Patient Details  Name: Lynn Ray MRN: [id number] Date of Birth: 1934-11-29  Date of Discharge from PT service:March 08, 2023    Patient has met 9 of 9 long term goals due to improved activity tolerance, improved balance, increased strength, functional use of  right upper extremity and right lower extremity, improved attention, and improved awareness.  Patient to discharge at an ambulatory level modified independent/ Supervision.   Patient's care partner is independent to provide the necessary physical supervision assistance at discharge.  Reasons goals not met: n/a all goals met  Recommendation:  Patient will benefit from ongoing skilled PT services in outpatient setting to continue to advance safe functional mobility, address ongoing impairments in strength, coordination, balance, activity tolerance, cognition/memory, safety awareness, and to minimize fall risk.  Equipment: No equipment provided  Reasons for discharge: treatment goals met  Patient/family agrees with progress made and goals achieved: Yes  PT Discharge Precautions/Restrictions Precautions Precautions: Fall;Other (comment) Precaution Comments: very mild R hemiparesis, HOH (hearing aids) Restrictions Weight Bearing Restrictions Per Provider Order: No Pain  Pain Assessment Pain Scale: 0-10 Pain Score: 0-No pain Pain Interference  Pain Interference Pain Effect on Sleep: 1. Rarely or not at all Pain Interference with Therapy Activities: 1. Rarely or not at all Pain Interference with Day-to-Day Activities: 1. Rarely or not at all Vision/Perception  Vision - History Ability to See in Adequate Light: 1 Impaired Vision - Assessment Eye Alignment: Within Functional Limits Ocular Range of Motion: Within Functional Limits Alignment/Gaze Preference: Within Defined Limits Perception Perception: Within Functional Limits Praxis Praxis: WFL  Cognition Overall Cognitive Status:  Impaired/Different from baseline Arousal/Alertness: Awake/alert Orientation Level: Oriented X4 Focused Attention: Appears intact Memory: Impaired Memory Impairment: Decreased short term memory Awareness: Appears intact Safety/Judgment: Appears intact Sensation Sensation Light Touch: Appears Intact Coordination Gross Motor Movements are Fluid and Coordinated: No Fine Motor Movements are Fluid and Coordinated: Yes Coordination and Movement Description: GM coordination deficit related to mild Rt sided weakness and overall impaired dynamic balance - continued but improved from eval Heel Shin Test: [facility] Motor  Motor Motor: Other (comment) Motor - Discharge Observations: very mild R hemiparesis, impaired dynamic balance - continued but improved from eval  Mobility Bed Mobility Bed Mobility: Supine to Sit;Sit to Supine Supine to Sit: Independent Sit to Supine: Independent Transfers Transfers: Sit to Stand;Stand to [employer] Transfers Sit to Stand: Independent with assistive device Stand to Sit: Independent with assistive device (reaches back with hands) Stand Pivot Transfers: Independent with assistive device (extra time for safety, aware of lines) Stand Pivot Transfer Details: Other (comment) (vc for forward weight shift to reduce use of LE extension into seat) Transfer (Assistive device): None Locomotion  Gait Ambulation: Yes Gait Assistance: Independent with assistive device (extra time) Assistive device: None Gait Gait: Yes Gait Pattern: Step-through pattern;Decreased hip/knee flexion - right;Decreased hip/knee flexion - left;Decreased step length - left;Decreased step length - right (increased lateral sway) Gait velocity: significantly decreased Stairs / Additional Locomotion Stairs: Yes Stairs Assistance: Supervision/Verbal cueing Stair Management Technique: Step to pattern;One rail Right;One rail Left;Forwards Height of Stairs: 6 Ramp: Supervision/Verbal  cueing Curb: Supervision/Verbal cueing Wheelchair Mobility Wheelchair Mobility: No  Trunk/Postural Assessment  Cervical Assessment Cervical Assessment: Exceptions to [facility] (forward head) Thoracic Assessment Thoracic Assessment: Exceptions to [facility] (rounded shoulders) Lumbar Assessment Lumbar Assessment: Exceptions to [facility] (posterior pelvic tilt in sitting) Postural Control Postural Control: Deficits on evaluation Righting Reactions: continued to be slightly delayed on R but improved reaction time  from eval  Balance Balance Balance Assessed: Yes Standardized Balance Assessment Standardized Balance Assessment: Functional Gait Assessment Static Sitting Balance Static Sitting - Balance Support: Feet supported;No upper extremity supported Static Sitting - Level of Assistance: 7: Independent Dynamic Sitting Balance Dynamic Sitting - Balance Support: Feet supported;No upper extremity supported Dynamic Sitting - Level of Assistance: 7: Independent Static Standing Balance Static Standing - Balance Support: During functional activity;No upper extremity supported Static Standing - Level of Assistance: 7: Independent Dynamic Standing Balance Dynamic Standing - Balance Support: During functional activity;Left upper extremity supported Dynamic Standing - Level of Assistance: 6: Modified independent (Device/Increase time) (uses UE support for independence and d/t FOF) Functional Gait  Assessment Gait assessed : Yes Gait Level Surface: Walks 20 ft in less than 7 sec but greater than 5.5 sec, uses assistive device, slower speed, mild gait deviations, or deviates 6-10 in outside of the 12 in walkway width. (requires 11 seconds) Change in Gait Speed: Able to smoothly change walking speed without loss of balance or gait deviation. Deviate no more than 6 in outside of the 12 in walkway width. Gait with Horizontal Head Turns: Performs head turns smoothly with no change in gait. Deviates no more than 6 in  outside 12 in walkway width Gait with Vertical Head Turns: Performs head turns with no change in gait. Deviates no more than 6 in outside 12 in walkway width. (more instability with vertical head turns compared to horizontal) Gait and Pivot Turn: Turns slowly, requires verbal cueing, or requires several small steps to catch balance following turn and stop Step Over Obstacle: Is able to step over one shoe box (4.5 in total height) without changing gait speed. No evidence of imbalance. Gait with Narrow Base of Support: Ambulates less than 4 steps heel to toe or cannot perform without assistance. Gait with Eyes Closed: Walks 20 ft, uses assistive device, slower speed, mild gait deviations, deviates 6-10 in outside 12 in walkway width. Ambulates 20 ft in less than 9 sec but greater than 7 sec. Ambulating Backwards: Walks 20 ft, uses assistive device, slower speed, mild gait deviations, deviates 6-10 in outside 12 in walkway width. Steps: Alternating feet, must use rail. Total Score: 20 FGA comment:: Medium Fall Risk Extremity Assessment  RUE Assessment RUE Assessment: Exceptions to [facility] Active Range of Motion (AROM) Comments: Shoulder flexion limited to ~100 degrees; WFL distally General Strength Comments: 3+/5 proximally, 4-/5 distally LUE Assessment LUE Assessment: Within Functional Limits Active Range of Motion (AROM) Comments: WFL General Strength Comments: 4-/5 grossly RLE Assessment RLE Assessment: Exceptions to [facility] General Strength Comments: grossly 4+/5, knee ext 4-5 LLE Assessment LLE Assessment: Within Functional Limits   [name] PT, DPT, CSRS 03/07/2023, 6:58 PM  Donald WENDI Pereyra, PT, DPT, CBIS 03/08/23 12:33 PM

## 2023-03-07 NOTE — Progress Notes (Signed)
 Speech Language Pathology Discharge Summary  Patient Details  Name: Lynn Ray MRN: [id number] Date of Birth: July 31, 1934  Date of Discharge from SLP service:March 07, 2023  Patient has met 2 of 3 long term goals.  Patient to discharge at overall Supervision;Min level.  Reasons goals not met: continues to require minA for word finding   Clinical Impression/Discharge Summary:  Pt has made good gains and has met 2 of 3 LTG's this admission due to improved cognition. Pt is currently an overall supervisionA for cognitive tasks and requires min cues for utilization of word finding compensatory strategies during conversation. Pt/family education complete and pt will discharge home with supervision from friends/family/etc. Pt would benefit from f/u ST services to maximize communication in order to maximize functional independence.    Care Partner:  Caregiver Able to Provide Assistance: Yes  Type of Caregiver Assistance: Physical;Cognitive  Recommendation:  Outpatient SLP  Rationale for SLP Follow Up: Maximize functional communication;Maximize cognitive function and independence   Equipment: n/a   Reasons for discharge: Discharged from hospital   Patient/Family Agrees with Progress Made and Goals Achieved: Yes    Carlotta Telfair M.A., CF-SLP 03/07/2023, 4:08 PM

## 2023-03-07 NOTE — Progress Notes (Signed)
 Speech Language Pathology Daily Session Note  Patient Details  Name: HADLEA FURUYA MRN: [id number] Date of Birth: October 28, 1934  Today's Date: 03/07/2023 SLP Individual Time: 1446-1530 SLP Individual Time Calculation (min): 44 min  Short Term Goals: Week 1: SLP Short Term Goal 1 (Week 1): STG = LTG due to ELOS  Skilled Therapeutic Interventions: Skilled therapy session focused on cognitive goals. SLP faciliated session by providing supervision A during identification of medication mistakes task. SLP continued to challenge patient through prompting completion of BID pillbox according to written directions. Patient utilized problem solving and organizational skills to complete task given supervision A. At this time recommend patient complete medication box at d/c with daughter checking for accuracy. Patient left in bed with alarm set and call bell in reach. Continue POC.   Pain None reported   Therapy/Group: Individual Therapy  Reata Petrov M.A., CF-SLP 03/07/2023, 7:43 AM

## 2023-03-07 NOTE — Progress Notes (Signed)
 Physical Therapy Session Note  Patient Details  Name: Lynn Ray MRN: [id number] Date of Birth: May 15, 1934  Today's Date: 03/07/2023 PT Individual Time: 1347-1430 PT Individual Time Calculation (min): 43 min   Short Term Goals: Week 1:  PT Short Term Goal 1 (Week 1): = to LTGs based on ELOS  Skilled Therapeutic Interventions/Progress Updates:  Patient seated on EOB on entrance to room. Patient alert and agreeable to PT session. Continuous IV fluids running during session.   Patient with no pain complaint at start of session.  Therapeutic Activity: Transfers: Pt performed sit<>stand and stand pivot transfers throughout session with improved performance and modI through use of hands.   Gait Training:  Pt ambulated 200' x2  using no AD with Mod I for time. Continues to demonstrate increased lateral sway and adequate but low step height bilaterally. No cueing required this session.   Ambulates up/ down twelve 6-in steps using R HR to ascend as in home environment in split level home. Completes with supervision.    Neuromuscular Re-ed: NMR facilitated during session with focus on dynamic gait and standing balance. Pt guided in remaining tasks of Functional Gait Assessment. See testing on d/c summary. Patient demonstrates medium fall risk as noted by score of 20/30 on  Functional Gait Assessment. Significant improved demo'd over previous assessment one week ago by increase in 11 points.  <22/30 = predictive of falls, <20/30 = fall in 6 months, <18/30 = predictive of falls in PD MCID: 5 points stroke population, 4 points geriatric population (ANPTA Core Set of Outcome Measures for Adults with Neurologic Conditions, 2018) Continues to demo impairments on NBOS items without UE support.    NMR performed for improvements in motor control and coordination, balance, sequencing, judgement, and self confidence/ efficacy in performing all aspects of mobility at highest level of independence.    Patient seated on EOB at end of session with brakes locked, bed alarm set, and all needs within reach.   Therapy Documentation Precautions:  Precautions Precautions: Fall, Other (comment) Precaution Comments: very mild R hemiparesis, HOH (hearing aids) Restrictions Weight Bearing Restrictions Per Provider Order: No  Pain:  No pain related this session.    Therapy/Group: Individual Therapy  [name] PT, DPT, CSRS 03/07/2023, 6:41 PM

## 2023-03-07 NOTE — Plan of Care (Signed)
  Problem: Consults Goal: RH STROKE PATIENT EDUCATION Description: See Patient Education module for education specifics  Outcome: Progressing   Problem: RH SAFETY Goal: RH STG ADHERE TO SAFETY PRECAUTIONS W/ASSISTANCE/DEVICE Description: STG Adhere to Safety Precautions With cues Assistance/Device. Outcome: Progressing   Problem: RH KNOWLEDGE DEFICIT Goal: RH STG INCREASE KNOWLEDGE OF DIABETES Description: Patient will be able to manage prediabetes using educational resources for dietary modifications independently Outcome: Progressing Goal: RH STG INCREASE KNOWLEDGE OF HYPERTENSION Description: Patient will be able to manage HTN using educational resources for dietary modifications independently Outcome: Progressing Goal: RH STG INCREASE KNOWLEGDE OF HYPERLIPIDEMIA Description: Patient will be able to manage HLD using educational resources for medications and  dietary modifications independently Outcome: Progressing Goal: RH STG INCREASE KNOWLEDGE OF STROKE PROPHYLAXIS Description: Patient will be able to manage secondary risks using educational resources for medications and  dietary modifications independently Outcome: Progressing

## 2023-03-07 NOTE — Progress Notes (Signed)
 Patient ID: Lynn Ray, female   DOB: [birth year], 89 y.o.   MRN: [id number]  Met with pt and daughter who is present to give them team conference update regarding goals of mod/I and discharge date of 2/7. Daughter has been here since her admission and will stay with short time to make sure doing ok. Feels home health would be better  and will order tub bench aware it is private pay. Glad to be going home on Friday

## 2023-03-07 NOTE — Progress Notes (Signed)
 PROGRESS NOTE   Subjective/Complaints:  No issues ovenite , discussed need to increase fluid intake  Review of systems negative fever, chills, chest pain shortness of breath nausea vomiting diarrhea or constipation.  Objective:   No results found. Recent Labs    03/05/23 0522  WBC 5.8  HGB 10.0*  HCT 31.4*  PLT 252    Recent Labs    03/05/23 0522 03/07/23 0516  NA 136 136  K 4.8 4.8  CL 109 109  CO2 22 19*  GLUCOSE 95 87  BUN 37* 37*  CREATININE 2.48* 2.33*  CALCIUM  9.8 9.6     Intake/Output Summary (Last 24 hours) at 03/07/2023 0748 Last data filed at 03/06/2023 1839 Gross per 24 hour  Intake 240 ml  Output --  Net 240 ml        Physical Exam: Vital Signs Blood pressure (!) 126/57, pulse 80, temperature 98 F (36.7 C), resp. rate 18, height 4' 11.5 (1.511 m), weight 80.2 kg, SpO2 100%.   General: No acute distress Mood and affect are appropriate Heart: Regular rate and rhythm no rubs murmurs or extra sounds Lungs: Clear to auscultation, breathing unlabored, on room air Abdomen: Positive bowel sounds, soft nontender to palpation, nondistended Extremities: No clubbing, cyanosis, or edema Skin: No evidence of breakdown, no evidence of rash Neurologic: Cranial nerves II through XII grossly intact, motor strength is 5/5 in bilateral deltoid, bicep, tricep, grip, hip flexor, knee extensors, ankle dorsiflexor and plantar flexor Sensory exam normal sensation to light touch in bilateral upper and lower extremities  Musculoskeletal: No joint swelling    Assessment/Plan: 1. Functional deficits which require 3+ hours per day of interdisciplinary therapy in a comprehensive inpatient rehab setting. Physiatrist is providing close team supervision and 24 hour management of active medical problems listed below. Physiatrist and rehab team continue to assess barriers to discharge/monitor patient progress toward  functional and medical goals  Care Tool:  Bathing    Body parts bathed by patient: Right arm, Left arm, Chest, Abdomen, Front perineal area, Buttocks, Right upper leg, Left upper leg, Right lower leg, Left lower leg, Face         Bathing assist Assist Level: Contact Guard/Touching assist     Upper Body Dressing/Undressing Upper body dressing   What is the patient wearing?: Pull over shirt, Bra    Upper body assist Assist Level: Minimal Assistance - Patient > 75%    Lower Body Dressing/Undressing Lower body dressing      What is the patient wearing?: Incontinence brief, Pants     Lower body assist Assist for lower body dressing: Minimal Assistance - Patient > 75%     Toileting Toileting    Toileting assist Assist for toileting: Minimal Assistance - Patient > 75%     Transfers Chair/bed transfer  Transfers assist     Chair/bed transfer assist level: Contact Guard/Touching assist     Locomotion Ambulation   Ambulation assist      Assist level: Contact Guard/Touching assist Assistive device: No Device Max distance: 200'   Walk 10 feet activity   Assist     Assist level: Contact Guard/Touching assist Assistive device: No Device  Walk 50 feet activity   Assist    Assist level: Contact Guard/Touching assist Assistive device: No Device    Walk 150 feet activity   Assist Walk 150 feet activity did not occur: Safety/medical concerns  Assist level: Contact Guard/Touching assist Assistive device: No Device    Walk 10 feet on uneven surface  activity   Assist Walk 10 feet on uneven surfaces activity did not occur: Safety/medical concerns         Wheelchair     Assist Is the patient using a wheelchair?: Yes (for transport for energy conservation) Type of Wheelchair: Manual    Wheelchair assist level: Dependent - Patient 0%      Wheelchair 50 feet with 2 turns activity    Assist        Assist Level: Dependent -  Patient 0%   Wheelchair 150 feet activity     Assist      Assist Level: Dependent - Patient 0%   Blood pressure (!) 126/57, pulse 80, temperature 98 F (36.7 C), resp. rate 18, height 4' 11.5 (1.511 m), weight 80.2 kg, SpO2 100%.  Medical Problem List and Plan: 1. 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  Antithrombotics: -DVT/anticoagulation:  Pharmaceutical: Lovenox  30 mg daily             -antiplatelet therapy: continue Aspirin  and Plavix  for three weeks followed by aspirin  alone   3. Pain Management: Tylenol  as needed  4. Mood/Behavior/Sleep: LCSW to evaluate and provide emotional support             -antipsychotic agents: n/a   5. Neuropsych/cognition: This patient is capable of making decisions on her own behalf.   6. Skin/Wound Care: Routine skin care checks   7. Fluids/Electrolytes/Nutrition: Routine Is and Os and follow-up chemistries             -continue B12 supplement daily   8: Hypertension: monitor TID and prn     03/07/2023    5:47 AM 03/06/2023    7:43 PM 03/06/2023    2:14 PM  Vitals with BMI  Systolic 126 126 873  Diastolic 57 44 51  Pulse 80 80 79   2/3 BP stable, continue to monitor    9: Hypothyroidism: continue Synthroid    10: CKD: baseline creatinine ~2.0                 Latest Ref Rng & Units 03/07/2023    5:16 AM 03/05/2023    5:22 AM 03/01/2023    5:46 AM  BMP  Glucose 70 - 99 mg/dL 87  95  87   BUN 8 - 23 mg/dL 37  37  26   Creatinine 0.44 - 1.00 mg/dL 7.66  7.51  7.79   Sodium 135 - 145 mmol/L 136  136  138   Potassium 3.5 - 5.1 mmol/L 4.8  4.8  4.9   Chloride 98 - 111 mmol/L 109  109  107   CO2 22 - 32 mmol/L 19  22  24    Calcium  8.9 - 10.3 mg/dL 9.6  9.8  9.9    BUN slightly elevated as well will enc po fluid , recheck BMP on Monday  Minimal improvements will cont IV and recheck in am , has  normal LV fxn  11: Chronic anemia: H and H stable; follow-upHgb improving     Latest Ref Rng & Units 03/05/2023    5:22 AM 03/01/2023    5:46 AM 02/24/2023    5:14 AM  CBC  WBC 4.0 - 10.5 K/uL 5.8  6.1  6.0   Hemoglobin 12.0 - 15.0 g/dL 89.9  88.8  89.2   Hematocrit 36.0 - 46.0 % 31.4  34.8  33.8   Platelets 150 - 400 K/uL 252  253  222   2/3 hemoglobin a little lower at 10.0, appears overall stable , may be dilutional with IVF  12. Morbid obesity: provide dietary education   13.  Severe glenohumeral osteoarthritis will consider repeat steroid injection if this interferes with OT/PT  RIght shoulder pain with elevation , chronic, Sports med visit 01/20/20 Hx suboccipital decompression for Chiari malformation 2002  C spine MRI 2020,  no cervical spinal stenosis [place]- severe glenohumeral osteoarthritis  SHe reportedly responded well to Right glenohumeral steroid injection   LOS: 7 days A FACE TO FACE EVALUATION WAS PERFORMED  Prentice FORBES Compton 03/07/2023, 7:48 AM

## 2023-03-07 NOTE — Progress Notes (Signed)
 Recreational Therapy Session Note  Patient Details  Name: Lynn Ray MRN: [id number] Date of Birth: [birth year] Today's Date: 03/07/2023  Pain:no c/o  Pt participated in animal assisted activity seated EOB with supervision.  Pt enjoyed visiting with the pet partner team and easily engaged in conversation with team.   Lynn Ray 03/07/2023, 3:51 PM

## 2023-03-07 NOTE — Progress Notes (Signed)
 Physical Therapy Session Note  Patient Details  Name: Lynn Ray MRN: [id number] Date of Birth: 1934-12-10  Today's Date: 03/07/2023 PT Individual Time: 0805-0847 PT Individual Time Calculation (min): 42 min   Short Term Goals: Week 1:  PT Short Term Goal 1 (Week 1): = to LTGs based on ELOS  Skilled Therapeutic Interventions/Progress Updates:  Patient supine in bed on entrance to room. Patient alert and agreeable to PT session.   Patient with no pain complaint at start of session.  Therapeutic Activity: Bed Mobility: Pt performed supine <> sit with IND from hospital bed as well as flat mattress/ bed in ADL apartment. No cueing required. Transfers: Pt performed sit<>stand and stand pivot transfers throughout session with Mod I d/t LE extension into seated surface. Reminded of session where pt sat and stood from rolling stool without moving stool. Provided with training during this session for forward scoot and hip hinge for appropriate weight shift over feet for improved balance upon standing. Pt self chooses to perform blocked practice x6 with no need for LE extension into seat and with IND.   Ambulatory transfer to toilet after choosing clothes for day provided with washcloth for spot cleaning and then is able to don brief, pants and shirt with Mod I/ setup. Washes hands at sink with IND.   Gait Training/ NMR:  Pt ambulated 200' x2 using no AD with distant supervision. Demonstrated continued low but adequate step height and slight lean to R that improves with time in gait. Provided vc for direction only.  Pt guided in most components of FGA with rest to be completed in afternoon session. Most difficulty with tasks requiring NBOS and complete turns.   Pt also guided in lateral step over of 4 obstacle. VC provided for stepping out laterally in order to make room for trailing LE. Requires adjustment step when leading with RLE. No LOB throughout.   Is also able to ambulate up/ down ramped  surface and through uneven surface in mulch pit with supervision/ Mod I as pt uses rail for improved confidence.  NMR performed for improvements in motor control and coordination, balance, sequencing, judgement, and self confidence/ efficacy in performing all aspects of mobility at highest level of independence.   Patient seated upright in recliner at end of session with brakes locked, belt alarm set, and all needs within reach.  Therapy Documentation Precautions:  Precautions Precautions: Fall, Other (comment) Precaution Comments: very mild R hemiparesis, HOH (hearing aids) Restrictions Weight Bearing Restrictions Per Provider Order: No  Pain:  No pain related this session.   Therapy/Group: Individual Therapy  [name] PT, DPT, CSRS 03/07/2023, 6:31 PM

## 2023-03-07 NOTE — Patient Care Conference (Signed)
 Inpatient RehabilitationTeam Conference and Plan of Care Update Date: 03/07/2023   Time: 10:37 AM    Patient Name: Lynn Ray      Medical Record Number: [id number]  Date of Birth: 03/24/1934 Sex: Female         Room/Bed: 4M09C/4M09C-01 Payor Info: Payor: [employer] MEDICARE / Plan: [employer] MEDICARE CHOICE PPO / Product Type: *No Product type* /    Admit Date/Time:  02/28/2023  1:36 PM  Primary Diagnosis:  CVA (cerebral vascular accident) [facility])  Hospital Problems: Principal Problem:   CVA (cerebral vascular accident) [facility]) Active Problems:   Stage 4 chronic kidney disease (HCC)    Expected Discharge Date: Expected Discharge Date: 03/09/23  Team Members Present: Physician leading conference: Dr. Prentice Compton Social Worker Present: Rhoda Clement, LCSW Nurse Present: Barnie Ronde, [place], RN PT Present: Recardo Milliner, PT OT Present: Katheryn Mines, OT SLP Present: Blaise Alderman, SLP     Current Status/Progress Goal Weekly Team Focus  Bowel/Bladder   Pt is continent of bowel/bladder   Pt will remain continent of bowel/bladder   Will assess qshift and PRN    Swallow/Nutrition/ Hydration               ADL's   UB BADLs mod I, LB BADLs and toileting supervision-CGA, toilet/shower transfers supervision-CGA without AD; Barriers- balance and activity tolerance   mod I   BADL retraining, balance training, activity tolerance, Pt education, IADL retraining    Mobility               Communication   minA   supervision   occasional word finding difficulties in conversation    Safety/Cognition/ Behavioral Observations  supervision-minA   supervision A   recall and use of memory/problem solving strategies    Pain   Pt denies pain   Pt will continue to deny pain   Will assess qshift and PRN    Skin   Pt's skin is intact   Pt's skin will remain intact  Will assess qshift and PRN      Discharge Planning:  HOme with daughter who is there TH-Sat and  then has someone at night Sun-TH. Alone during the day Sun-Wed. Seems to need 24/7 due to speech issues. Will discuss with daughter and await team's recommendations   Team Discussion: Patient admitted post CVA with balance (protective/reactive) issues and occasional word finding issues.    Patient on target to meet rehab goals: yes, currently need supervision for ADLs; mod I for upper body and supervision for toilet transfers without an assistive device. Patient able to complete IADLs with close supervision. Able to ambulate up to 200' without an assistive device.  Goals for discharge set for mod I overall.  *See Care Plan and progress notes for long and short-term goals.   Revisions to Treatment Plan:  Working on high level cognitive tasks; medication management, etc.   Teaching Needs: Safety, medications, diet changes, transfers, toileting,etc.   Current Barriers to Discharge: Decreased caregiver support  Possible Resolutions to Barriers: Family education OP follow up services DME: TTB     Medical Summary Current Status: acute exacerbation of chronic renal  Barriers to Discharge: Uncontrolled Pain   Possible Resolutions to Barriers/Weekly Focus: monitor BMET   Continued Need for Acute Rehabilitation Level of Care: The patient requires daily medical management by a physician with specialized training in physical medicine and rehabilitation for the following reasons: Direction of a multidisciplinary physical rehabilitation program to maximize functional independence : Yes Medical management  of patient stability for increased activity during participation in an intensive rehabilitation regime.: Yes Analysis of laboratory values and/or radiology reports with any subsequent need for medication adjustment and/or medical intervention. : Yes   I attest that I was present, lead the team conference, and concur with the assessment and plan of the team.   Fredericka Sober B 03/07/2023,  1:56 PM

## 2023-03-07 NOTE — Plan of Care (Signed)
  Problem: RH Expression Communication Goal: LTG Patient will increase word finding of common (SLP) Description: LTG:  Patient will increase word finding of common objects/daily info/abstract thoughts with cues using compensatory strategies (SLP). Outcome: Not Met (continues to require minA)   Problem: RH Problem Solving Goal: LTG Patient will demonstrate problem solving for (SLP) Description: LTG:  Patient will demonstrate problem solving for basic/complex daily situations with cues  (SLP) Outcome: Completed/Met   Problem: RH Memory Goal: LTG Patient will use memory compensatory aids to (SLP) Description: LTG:  Patient will use memory compensatory aids to recall biographical/new, daily complex information with cues (SLP) Outcome: Completed/Met

## 2023-03-08 ENCOUNTER — Other Ambulatory Visit (HOSPITAL_COMMUNITY): Payer: Self-pay

## 2023-03-08 DIAGNOSIS — N184 Chronic kidney disease, stage 4 (severe): Secondary | ICD-10-CM | POA: Diagnosis not present

## 2023-03-08 DIAGNOSIS — I63133 Cerebral infarction due to embolism of bilateral carotid arteries: Secondary | ICD-10-CM | POA: Diagnosis not present

## 2023-03-08 LAB — BASIC METABOLIC PANEL
Anion gap: 6 (ref 5–15)
BUN: 34 mg/dL — ABNORMAL HIGH (ref 8–23)
CO2: 18 mmol/L — ABNORMAL LOW (ref 22–32)
Calcium: 9.5 mg/dL (ref 8.9–10.3)
Chloride: 115 mmol/L — ABNORMAL HIGH (ref 98–111)
Creatinine, Ser: 2.26 mg/dL — ABNORMAL HIGH (ref 0.44–1.00)
GFR, Estimated: 21 mL/min — ABNORMAL LOW (ref >60)
Glucose, Bld: 91 mg/dL (ref 70–99)
Potassium: 4.7 mmol/L (ref 3.5–5.1)
Sodium: 139 mmol/L (ref 135–145)

## 2023-03-08 MED ORDER — CLOPIDOGREL BISULFATE 75 MG PO TABS
75.0000 mg | ORAL_TABLET | Freq: Every day | ORAL | 0 refills | Status: AC
  Filled 2023-03-08: qty 6, 6d supply, fill #0

## 2023-03-08 MED ORDER — ACETAMINOPHEN 325 MG PO TABS: 325.0000 mg | ORAL_TABLET | ORAL | Status: AC | PRN

## 2023-03-08 MED ORDER — ATORVASTATIN CALCIUM 40 MG PO TABS
40.0000 mg | ORAL_TABLET | Freq: Every day | ORAL | 0 refills | Status: DC
  Filled 2023-03-08: qty 30, 30d supply, fill #0

## 2023-03-08 MED ORDER — ASPIRIN 81 MG PO TBEC: 81.0000 mg | DELAYED_RELEASE_TABLET | Freq: Every day | ORAL | Status: AC

## 2023-03-08 NOTE — Progress Notes (Signed)
 Physical Therapy Session Note  Patient Details  Name: Lynn Ray MRN: [id number] Date of Birth: 12-Oct-1934  Today's Date: 03/08/2023 PT Individual Time: 1017-1130 PT Individual Time Calculation (min): 73 min   Short Term Goals: Week 1:  PT Short Term Goal 1 (Week 1): = to LTGs based on ELOS  Skilled Therapeutic Interventions/Progress Updates:    Session focused on completing d/c assessment, issuing HEP for home strengthening and balance program, various functional transfers, and gait in community environment setting downstairs in gift shop.  Pt performs transfers throughout session overall modified independent without device for extra time or intermittent use of armrests or surface for support. Pt performed gait in hallway modified independent without device (PT managing IV pole only) with lateral leans, slightly antalgic gait pattern and decreased stance time on the R with slow cadence but not unsafe.   Simulated car transfer to sedan height modified independent with safe technique to prepare for d/c tomorrow. Pt performed gait over ramped surface and mulch with supervision for uneven surfaces and community mobility simulation.  Instructed and issued HEP below with pt performed 10 reps each for practice and reinforcement. Gave her suggestions about how to modify and increase challenge for exercises as well.   Access Code: [email or web address] URL: [email or web address] Date: 03/08/2023 Prepared by: Ray  Exercises - Sit to Stand  - 1 x daily - 7 x weekly - 3 sets - 10 reps - Standing Heel Raise with Support  - 1 x daily - 7 x weekly - 3 sets - 10 reps - Standing Toe Raises at Chair  - 1 x daily - 7 x weekly - 3 sets - 10 reps - Standing Hip Abduction  - 1 x daily - 7 x weekly - 3 sets - 10 reps - Tandem Stance with Support  - 1 x daily - 7 x weekly - 3 sets - 10 reps   Dynamic gait downstairs in gift shop including reaching for items on various height shelves (high and low),  navigating through tight spaces, and obstacles. Pt completed at mod I level. Pt denies any concerns about d/c tomorrow.  Therapy Documentation Precautions:  Precautions Precautions: Fall, Other (comment) Precaution Comments: very mild R hemiparesis, HOH (hearing aids) Restrictions Weight Bearing Restrictions Per Provider Order: No    Pain: Pain Assessment Pain Scale: 0-10 Pain Score: 0-No pain      Therapy/Group: Individual Therapy  Lynn Ray [facility] 03/08/2023, 12:21 PM

## 2023-03-08 NOTE — Progress Notes (Addendum)
 Inpatient Rehabilitation Discharge Medication Review by a Pharmacist  A complete drug regimen review was completed for this patient to identify any potential clinically significant medication issues.  High Risk Drug Classes Is patient taking? Indication by Medication  Antipsychotic No   Anticoagulant No   Antibiotic No   Opioid No   Antiplatelet Yes Aspirin  81 mg and clopidogrel  x 3 weeks, then Aspirin  alone - CVA prophylaxis  Hypoglycemics/insulin No   Vasoactive Medication No   Chemotherapy No   Other Yes Atorvastatin  - hyperlipidemia Vitamin D, B12 - supplements Levothyroxine  - hypothyroidism Lubricant eye drops - dry eyes  PRNs: Acetaminophen  - mild pain     Type of Medication Issue Identified Description of Issue Recommendation(s)  Drug Interaction(s) (clinically significant)     Duplicate Therapy     Allergy     No Medication Administration End Date     Incorrect Dose     Additional Drug Therapy Needed     Significant med changes from prior encounter (inform family/care partners about these prior to discharge). Metoprolol  and hydrochlorthiazide discontinued. communicate changes with patient/family prior to discharge.  Other       Clinically significant medication issues were identified that warrant physician communication and completion of prescribed/recommended actions by midnight of the next day:  No  Pharmacist comments:  - Aspirin  81 mg and Clopidogrel  x 3 weeks, then Aspirin  alone. Stop date in place.  Time spent performing this drug regimen review (minutes):  [address]  Lynn Ray, [place] 03/08/2023, 5:05 PM

## 2023-03-08 NOTE — Progress Notes (Signed)
 PROGRESS NOTE   Subjective/Complaints:  No c/o, discussed D/C process  Hope to have mod I in room today   Review of systems negative fever, chills, chest pain shortness of breath nausea vomiting diarrhea or constipation.  Objective:   No results found. No results for input(s): WBC, HGB, HCT, PLT in the last 72 hours.   Recent Labs    03/07/23 0516 03/08/23 0530  NA 136 139  K 4.8 4.7  CL 109 115*  CO2 19* 18*  GLUCOSE 87 91  BUN 37* 34*  CREATININE 2.33* 2.26*  CALCIUM  9.6 9.5     Intake/Output Summary (Last 24 hours) at 03/08/2023 0757 Last data filed at 03/08/2023 0412 Gross per 24 hour  Intake 3887.88 ml  Output --  Net 3887.88 ml        Physical Exam: Vital Signs Blood pressure (!) 151/67, pulse 86, temperature 97.9 F (36.6 C), resp. rate 18, height 4' 11.5 (1.511 m), weight 81.5 kg, SpO2 99%.   General: No acute distress Mood and affect are appropriate Heart: Regular rate and rhythm no rubs murmurs or extra sounds Lungs: Clear to auscultation, breathing unlabored, on room air Abdomen: Positive bowel sounds, soft nontender to palpation, nondistended Extremities: No clubbing, cyanosis, or edema Skin: No evidence of breakdown, no evidence of rash Neurologic: Cranial nerves II through XII grossly intact, motor strength is 5/5 in bilateral deltoid, bicep, tricep, grip, hip flexor, knee extensors, ankle dorsiflexor and plantar flexor Sensory exam normal sensation to light touch in bilateral upper and lower extremities  Musculoskeletal: No joint swelling    Assessment/Plan: 1. Functional deficits which require 3+ hours per day of interdisciplinary therapy in a comprehensive inpatient rehab setting. Physiatrist is providing close team supervision and 24 hour management of active medical problems listed below. Physiatrist and rehab team continue to assess barriers to discharge/monitor patient  progress toward functional and medical goals  Care Tool:  Bathing    Body parts bathed by patient: Right arm, Left arm, Chest, Abdomen, Front perineal area, Buttocks, Right upper leg, Left upper leg, Right lower leg, Left lower leg, Face         Bathing assist Assist Level: Contact Guard/Touching assist     Upper Body Dressing/Undressing Upper body dressing   What is the patient wearing?: Pull over shirt, Bra    Upper body assist Assist Level: Minimal Assistance - Patient > 75%    Lower Body Dressing/Undressing Lower body dressing      What is the patient wearing?: Incontinence brief, Pants     Lower body assist Assist for lower body dressing: Minimal Assistance - Patient > 75%     Toileting Toileting    Toileting assist Assist for toileting: Minimal Assistance - Patient > 75%     Transfers Chair/bed transfer  Transfers assist     Chair/bed transfer assist level: Contact Guard/Touching assist     Locomotion Ambulation   Ambulation assist      Assist level: Contact Guard/Touching assist Assistive device: No Device Max distance: 200'   Walk 10 feet activity   Assist     Assist level: Contact Guard/Touching assist Assistive device: No Device   Walk 50  feet activity   Assist    Assist level: Contact Guard/Touching assist Assistive device: No Device    Walk 150 feet activity   Assist Walk 150 feet activity did not occur: Safety/medical concerns  Assist level: Contact Guard/Touching assist Assistive device: No Device    Walk 10 feet on uneven surface  activity   Assist Walk 10 feet on uneven surfaces activity did not occur: Safety/medical concerns         Wheelchair     Assist Is the patient using a wheelchair?: Yes (for transport for energy conservation) Type of Wheelchair: Manual    Wheelchair assist level: Dependent - Patient 0%      Wheelchair 50 feet with 2 turns activity    Assist        Assist Level:  Dependent - Patient 0%   Wheelchair 150 feet activity     Assist      Assist Level: Dependent - Patient 0%   Blood pressure (!) 151/67, pulse 86, temperature 97.9 F (36.6 C), resp. rate 18, height 4' 11.5 (1.511 m), weight 81.5 kg, SpO2 99%.  Medical Problem List and Plan: 1. Functional deficits secondary to acute right frontal and left thalamic CVA             -patient may shower             -ELOS/Goals: 2/7 mod I             -Continue CIR   2.  Antithrombotics: -DVT/anticoagulation:  Pharmaceutical: Lovenox  30 mg daily             -antiplatelet therapy: continue Aspirin  and Plavix  for three weeks followed by aspirin  alone   3. Pain Management: Tylenol  as needed  4. Mood/Behavior/Sleep: LCSW to evaluate and provide emotional support             -antipsychotic agents: n/a   5. Neuropsych/cognition: This patient is capable of making decisions on her own behalf.   6. Skin/Wound Care: Routine skin care checks   7. Fluids/Electrolytes/Nutrition: Routine Is and Os and follow-up chemistries             -continue B12 supplement daily   8: Hypertension: monitor TID and prn     03/08/2023    4:30 AM 03/07/2023    7:36 PM 03/07/2023    6:27 PM  Vitals with BMI  Weight   179 lbs 11 oz  BMI   35.7  Systolic 151 137   Diastolic 67 98   Pulse 86 74    2/6 mildly elevated today , likely due to IVF    9: Hypothyroidism: continue Synthroid    10: CKD: baseline creatinine ~2.0                 Latest Ref Rng & Units 03/08/2023    5:30 AM 03/07/2023    5:16 AM 03/05/2023    5:22 AM  BMP  Glucose 70 - 99 mg/dL 91  87  95   BUN 8 - 23 mg/dL 34  37  37   Creatinine 0.44 - 1.00 mg/dL 7.73  7.66  7.51   Sodium 135 - 145 mmol/L 139  136  136   Potassium 3.5 - 5.1 mmol/L 4.7  4.8  4.8   Chloride 98 - 111 mmol/L 115  109  109   CO2 22 - 32 mmol/L 18  19  22    Calcium  8.9 - 10.3 mg/dL 9.5  9.6  9.8  Creatinine at baseline BUN is mildly elevated , will d/c IVF and enc po fluids   11: Chronic anemia: H and H stable; follow-upHgb improving     Latest Ref Rng & Units 03/05/2023    5:22 AM 03/01/2023    5:46 AM 02/24/2023    5:14 AM  CBC  WBC 4.0 - 10.5 K/uL 5.8  6.1  6.0   Hemoglobin 12.0 - 15.0 g/dL 89.9  88.8  89.2   Hematocrit 36.0 - 46.0 % 31.4  34.8  33.8   Platelets 150 - 400 K/uL 252  253  222   2/3 hemoglobin a little lower at 10.0, appears overall stable , may be dilutional with IVF  12. Morbid obesity: provide dietary education   13.  Severe glenohumeral osteoarthritis will consider repeat steroid injection if this interferes with OT/PT  RIght shoulder pain with elevation , chronic, Sports med visit 01/20/20 Hx suboccipital decompression for Chiari malformation 2002  C spine MRI 2020,  no cervical spinal stenosis [place] [facility] severe glenohumeral [place]  SHe reportedly responded well to Right glenohumeral steroid injection   LOS: 8 days A FACE TO FACE EVALUATION WAS PERFORMED  Prentice FORBES Compton 03/08/2023, 7:57 AM

## 2023-03-08 NOTE — Progress Notes (Addendum)
 Occupational Therapy Discharge Summary  Patient Details  Name: Lynn Ray MRN: [id number] Date of Birth: 1934/03/29  Date of Discharge from OT service:March 08, 2023  Today's Date: 03/08/2023 OT Individual Time:  0800-0915    OT Time Calculation: 75 min    Patient has met 9 of 9 long term goals due to improved activity tolerance, improved balance, postural control, ability to compensate for deficits, functional use of  RIGHT upper and RIGHT lower extremity, improved attention, improved awareness, and improved coordination.  Patient to discharge at overall Modified Independent level.  Patient's care partner is independent to provide the necessary physical and cognitive assistance at discharge. Recommending intermittent supervision for higher level IADLs to increase Pt's safety.    Reasons goals not met: All goals met  Recommendation:  Patient will benefit from ongoing skilled OT services in home health setting to continue to advance functional skills in the area of BADL, iADL, and Reduce care partner burden.  Equipment: TTB  Reasons for discharge: treatment goals met and discharge from hospital  Patient/family agrees with progress made and goals achieved: Yes  OT Discharge Skilled Therapeutic Interventions/Progress Updates:  Pt received using restroom with nursing staff present in room. Pt presenting to be in good spirits receptive to skilled OT session reporting 0/10 pain- OT offering intermittent rest breaks, repositioning, and therapeutic support to optimize participation in therapy session. Pt's DTR present in room upon OT arrival for informal family education BADL focused session. Provided education on fall prevention, energy conservation, and CVA recovery and impact on Pt's functional status with Pt and Pt's DTR receptive to education. Provided education on follow-up therapy recommendations of HHOT and DME recommendations for TTB with both receptive to plan. Pt and Pt's DTR  reporting all questions were answered following educational session. Remainder of session focused on completing Pt's morning BADL routine to increase safety and independence in BADLs. Pt completed functional toilet and shower transfers mod I this session without AD or LOB. Pt able to complete U/LB bathing in shower seated on shower seat with back and standing using grab bars to wash buttocks. U/LB dressing completed sitting EOB with Pt able to donn bra, sweatshirt, brief, pants, and slip-on shoes mod I this session. Engaged Pt in completing grooming/hygiene tasks standing at sink for increased balance and endurance challenge with Pt able to complete oral hygiene, groom hair, and apply lotion in standing position without LOB. Pt with improved safety awareness and insight into her deficits this session. Pt also able to independently apply energy conservation and fall prevention techniques during BADLs- providing self increased time, taking seated rest breaks, and checking environment for hazards prior to ambulating. Pt mad mod I in room at end of session with nursing staff made aware and in agreement with plan as Pt is safety to independently ambulate and complete BADLs in her room.  Pt was left resting EOB with call bell in reach and all needs met.   Precautions/Restrictions  Precautions Precautions: Fall;Other (comment) Precaution Comments: very mild R hemiparesis, HOH (hearing aids) Restrictions Weight Bearing Restrictions Per Provider Order: No ADL ADL Eating: Independent Where Assessed-Eating: Edge of bed Grooming: Independent Where Assessed-Grooming: Standing at sink Upper Body Bathing: Independent Where Assessed-Upper Body Bathing: Shower Lower Body Bathing: Independent Where Assessed-Lower Body Bathing: Shower Upper Body Dressing: Independent Where Assessed-Upper Body Dressing: Edge of bed Lower Body Dressing: Independent Where Assessed-Lower Body Dressing: Edge of bed Toileting:  Independent Where Assessed-Toileting: Teacher, Adult Education: Engineer, Agricultural Method: [employer]  Transfer Equipment: Engineer, Technical Sales: Modified independent Web Designer Method: Ship Broker: Insurance Underwriter: Modified independent Film/video Editor Method: Designer, Industrial/product: Information systems manager with back, Grab bars Vision Baseline Vision/History: 1 Wears glasses Patient Visual Report: No change from baseline Vision Assessment?: Yes Eye Alignment: Within Functional Limits Ocular Range of Motion: Within Functional Limits Alignment/Gaze Preference: Within Defined Limits Tracking/Visual Pursuits: Decreased smoothness of horizontal tracking;Decreased smoothness of vertical tracking;Other (comment) Saccades: Additional head turns occurred during testing Convergence: Within functional limits Visual Fields: No apparent deficits Perception  Perception: Within Functional Limits Praxis Praxis: WFL Cognition Cognition Overall Cognitive Status: Impaired/Different from baseline Arousal/Alertness: Awake/alert Orientation Level: Person;Place;Situation Person: Oriented Place: Oriented Situation: Oriented Memory: Impaired Memory Impairment: Decreased short term memory Focused Attention: Appears intact Sustained Attention: Appears intact Awareness: Appears intact Problem Solving: Impaired Problem Solving Impairment: Functional complex Safety/Judgment: Appears intact Brief Interview for Mental Status (BIMS) Repetition of Three Words (First Attempt): 3 Temporal Orientation: Year: Correct Temporal Orientation: Month: Accurate within 5 days Temporal Orientation: Day: Correct Recall: Sock: Yes, no cue required Recall: Blue: Yes, no cue required Recall: Bed: Yes, after cueing (a piece of furniture) BIMS Summary Score: 14 Sensation Sensation Light Touch: Appears  Intact Hot/Cold: Appears Intact Proprioception: Appears Intact Stereognosis: Not tested Additional Comments: Pt reports numbness in Rt hand however intact light touch with formal assessment Coordination Gross Motor Movements are Fluid and Coordinated: No Fine Motor Movements are Fluid and Coordinated: Yes Coordination and Movement Description: GM coordination deficit related to mild Rt sided weakness and overall impaired dynamic balance - continued but improved from eval Finger Nose Finger Test: [facility] Heel Shin Test: [facility] Motor  Motor Motor: Other (comment) Motor - Discharge Observations: very mild R hemiparesis, impaired dynamic balance - continued but improved from eval Mobility  Bed Mobility Bed Mobility: Supine to Sit;Sit to Supine Supine to Sit: Independent Sit to Supine: Independent Transfers Sit to Stand: Independent with assistive device Stand to Sit: Independent with assistive device  Trunk/Postural Assessment  Cervical Assessment Cervical Assessment: Exceptions to [facility] (forward head) Thoracic Assessment Thoracic Assessment: Exceptions to [facility] (rounded shoulders) Lumbar Assessment Lumbar Assessment: Exceptions to [facility] (posterior pelvic tilt in sitting) Postural Control Postural Control: Deficits on evaluation Righting Reactions: continued to be slightly delayed on R but improved reaction time from eval  Balance Balance Balance Assessed: Yes Standardized Balance Assessment Standardized Balance Assessment: Functional Gait Assessment Static Sitting Balance Static Sitting - Balance Support: Feet supported;No upper extremity supported Static Sitting - Level of Assistance: 7: Independent Dynamic Sitting Balance Dynamic Sitting - Balance Support: Feet supported;No upper extremity supported Dynamic Sitting - Level of Assistance: 7: Independent Static Standing Balance Static Standing - Balance Support: During functional activity;No upper extremity supported Static Standing -  Level of Assistance: 7: Independent Dynamic Standing Balance Dynamic Standing - Balance Support: During functional activity;Left upper extremity supported Dynamic Standing - Level of Assistance: 6: Modified independent (Device/Increase time) Functional Gait  Assessment Gait assessed : Yes Gait Level Surface: Walks 20 ft in less than 7 sec but greater than 5.5 sec, uses assistive device, slower speed, mild gait deviations, or deviates 6-10 in outside of the 12 in walkway width. (requires 11 seconds) Change in Gait Speed: Able to smoothly change walking speed without loss of balance or gait deviation. Deviate no more than 6 in outside of the 12 in walkway width. Gait with Horizontal Head Turns: Performs head turns smoothly with no change in gait. Deviates no more  than 6 in outside 12 in walkway width Gait with Vertical Head Turns: Performs head turns with no change in gait. Deviates no more than 6 in outside 12 in walkway width. (more instability with vertical head turns compared to horizontal) Gait and Pivot Turn: Turns slowly, requires verbal cueing, or requires several small steps to catch balance following turn and stop Step Over Obstacle: Is able to step over one shoe box (4.5 in total height) without changing gait speed. No evidence of imbalance. Gait with Narrow Base of Support: Ambulates less than 4 steps heel to toe or cannot perform without assistance. Gait with Eyes Closed: Walks 20 ft, uses assistive device, slower speed, mild gait deviations, deviates 6-10 in outside 12 in walkway width. Ambulates 20 ft in less than 9 sec but greater than 7 sec. Ambulating Backwards: Walks 20 ft, uses assistive device, slower speed, mild gait deviations, deviates 6-10 in outside 12 in walkway width. Steps: Alternating feet, must use rail. Total Score: 20 FGA comment:: Medium Fall Risk Extremity/Trunk Assessment RUE Assessment RUE Assessment: Exceptions to [facility] Active Range of Motion (AROM) Comments:  Shoulder flexion limited to ~100 degrees; WFL distally General Strength Comments: 3+/5 proximally, 4-/5 distally LUE Assessment LUE Assessment: Within Functional Limits Active Range of Motion (AROM) Comments: WFL General Strength Comments: 4-/5 grossly   Lynn Ray 03/08/2023, 8:51 AM

## 2023-03-08 NOTE — Group Note (Signed)
 Patient Details Name: Lynn Ray MRN: [id number] DOB: [birth year] Today's Date: 03/08/2023  Time Calculation: OT Group Time Calculation OT Group Start Time: 1430 OT Group Stop Time: 1530 OT Group Time Calculation (min): 60 min      Group Description: Dance Group: Pt participated in dance group with an emphasis on social interaction, motor planning, increasing overall activity tolerance and bimanual tasks. All songs were selected by group members. Dance moves included AROM of BUE/BLE gross motor movements with an emphasis on building functional endurance.    Individual level documentation: Patient completed group from sitting level. Patientt needed supervision to complete various dance moves with OT providing a visual model  Patient able to create her own modifications during group.  Pain:  0/10  Precautions: Precautions: Fall, Other (comment)  Katheryn SQUIBB [facility] 03/08/2023, 3:48 PM

## 2023-03-08 NOTE — Plan of Care (Signed)
  Problem: RH Balance Goal: LTG Patient will maintain dynamic standing with ADLs (OT) Description: LTG:  Patient will maintain dynamic standing balance with assist during activities of daily living (OT)  Outcome: Completed/Met   Problem: Sit to Stand Goal: LTG:  Patient will perform sit to stand in prep for activites of daily living with assistance level (OT) Description: LTG:  Patient will perform sit to stand in prep for activites of daily living with assistance level (OT) Outcome: Completed/Met   Problem: RH Bathing Goal: LTG Patient will bathe all body parts with assist levels (OT) Description: LTG: Patient will bathe all body parts with assist levels (OT) Outcome: Completed/Met   Problem: RH Dressing Goal: LTG Patient will perform upper body dressing (OT) Description: LTG Patient will perform upper body dressing with assist, with/without cues (OT). Outcome: Completed/Met Goal: LTG Patient will perform lower body dressing w/assist (OT) Description: LTG: Patient will perform lower body dressing with assist, with/without cues in positioning using equipment (OT) Outcome: Completed/Met   Problem: RH Toileting Goal: LTG Patient will perform toileting task (3/3 steps) with assistance level (OT) Description: LTG: Patient will perform toileting task (3/3 steps) with assistance level (OT)  Outcome: Completed/Met   Problem: RH Simple Meal Prep Goal: LTG Patient will perform simple meal prep w/assist (OT) Description: LTG: Patient will perform simple meal prep with assistance, with/without cues (OT). Outcome: Completed/Met   Problem: RH Toilet Transfers Goal: LTG Patient will perform toilet transfers w/assist (OT) Description: LTG: Patient will perform toilet transfers with assist, with/without cues using equipment (OT) Outcome: Completed/Met   Problem: RH Tub/Shower Transfers Goal: LTG Patient will perform tub/shower transfers w/assist (OT) Description: LTG: Patient will perform  tub/shower transfers with assist, with/without cues using equipment (OT) Outcome: Completed/Met   

## 2023-03-08 NOTE — Plan of Care (Signed)
  Problem: Consults Goal: RH STROKE PATIENT EDUCATION Description: See Patient Education module for education specifics  Outcome: Progressing   Problem: RH SAFETY Goal: RH STG ADHERE TO SAFETY PRECAUTIONS W/ASSISTANCE/DEVICE Description: STG Adhere to Safety Precautions With cues Assistance/Device. Outcome: Progressing   Problem: RH KNOWLEDGE DEFICIT Goal: RH STG INCREASE KNOWLEDGE OF DIABETES Description: Patient will be able to manage prediabetes using educational resources for dietary modifications independently Outcome: Progressing Goal: RH STG INCREASE KNOWLEDGE OF HYPERTENSION Description: Patient will be able to manage HTN using educational resources for dietary modifications independently Outcome: Progressing Goal: RH STG INCREASE KNOWLEGDE OF HYPERLIPIDEMIA Description: Patient will be able to manage HLD using educational resources for medications and  dietary modifications independently Outcome: Progressing Goal: RH STG INCREASE KNOWLEDGE OF STROKE PROPHYLAXIS Description: Patient will be able to manage secondary risks using educational resources for medications and  dietary modifications independently Outcome: Progressing

## 2023-03-09 ENCOUNTER — Other Ambulatory Visit (HOSPITAL_COMMUNITY): Payer: Self-pay

## 2023-03-09 DIAGNOSIS — N184 Chronic kidney disease, stage 4 (severe): Secondary | ICD-10-CM | POA: Diagnosis not present

## 2023-03-09 DIAGNOSIS — I63133 Cerebral infarction due to embolism of bilateral carotid arteries: Secondary | ICD-10-CM | POA: Diagnosis not present

## 2023-03-09 NOTE — Progress Notes (Signed)
 Patient ID: Lynn Ray, female   DOB: [birth year], 89 y.o.   MRN: [id number]   INPATIENT REHABILITATION DISCHARGE NOTE   Discharge instructions by: Nena Buba, PA-C  Verbalized understanding: yes  Skin care/Wound care healing: CDI  Pain: Slight headache, Tylenol  given  IV's: discontinued  Tubes/Drains: n/a   O2: n/a  Safety instructions: provided  Patient belongings: returned  Discharged to: home  Discharged via: daughter

## 2023-03-09 NOTE — Progress Notes (Signed)
 PROGRESS NOTE   Subjective/Complaints:    Review of systems negative fever, chills, chest pain shortness of breath nausea vomiting diarrhea or constipation.  Objective:   No results found. No results for input(s): WBC, HGB, HCT, PLT in the last 72 hours.   Recent Labs    03/07/23 0516 03/08/23 0530  NA 136 139  K 4.8 4.7  CL 109 115*  CO2 19* 18*  GLUCOSE 87 91  BUN 37* 34*  CREATININE 2.33* 2.26*  CALCIUM  9.6 9.5     Intake/Output Summary (Last 24 hours) at 03/09/2023 0759 Last data filed at 03/08/2023 1814 Gross per 24 hour  Intake 1003.05 ml  Output --  Net 1003.05 ml        Physical Exam: Vital Signs Blood pressure (!) 131/55, pulse 79, temperature 98.1 F (36.7 C), resp. rate 18, height 4' 11.5 (1.511 m), weight 81.5 kg, SpO2 98%.   General: No acute distress Mood and affect are appropriate Heart: Regular rate and rhythm no rubs murmurs or extra sounds Lungs: Clear to auscultation, breathing unlabored, on room air Abdomen: Positive bowel sounds, soft nontender to palpation, nondistended Extremities: No clubbing, cyanosis, or edema Skin: No evidence of breakdown, no evidence of rash Neurologic: Cranial nerves II through XII grossly intact, motor strength is 5/5 in bilateral deltoid, bicep, tricep, grip, hip flexor, knee extensors, ankle dorsiflexor and plantar flexor Sensory exam normal sensation to light touch in bilateral upper and lower extremities  Musculoskeletal: No joint swelling    Assessment/Plan: 1. Functional deficits due to right frontal and left thalamic stroke ,  Stable for D/C today F/u PCP in 3-4 weeks F/u PM&R 2 weeks F/u Neuro 1-2 mo See D/C summary See D/C instructions   Care Tool:  Bathing    Body parts bathed by patient: Right arm, Left arm, Chest, Abdomen, Front perineal area, Buttocks, Right upper leg, Left upper leg, Right lower leg, Left lower leg, Face          Bathing assist Assist Level: Independent with assistive device Assistive Device Comment: seated on shower chair   Upper Body Dressing/Undressing Upper body dressing   What is the patient wearing?: Pull over shirt, Bra    Upper body assist Assist Level: Independent with assistive device    Lower Body Dressing/Undressing Lower body dressing      What is the patient wearing?: Incontinence brief, Pants     Lower body assist Assist for lower body dressing: Independent     Toileting Toileting    Toileting assist Assist for toileting: Independent with assistive device Assistive Device Comment: grab bars   Transfers Chair/bed transfer  Transfers assist     Chair/bed transfer assist level: Independent with assistive device     Locomotion Ambulation   Ambulation assist      Assist level: Independent with assistive device (extra time) Assistive device: No Device Max distance: 200'   Walk 10 feet activity   Assist     Assist level: Independent with assistive device Assistive device: No Device   Walk 50 feet activity   Assist    Assist level: Independent with assistive device Assistive device: No Device    Walk 150  feet activity   Assist Walk 150 feet activity did not occur: Safety/medical concerns  Assist level: Independent with assistive device Assistive device: No Device    Walk 10 feet on uneven surface  activity   Assist Walk 10 feet on uneven surfaces activity did not occur: Safety/medical concerns   Assist level: Supervision/Verbal cueing     Wheelchair     Assist Is the patient using a wheelchair?: No Type of Wheelchair: Manual    Wheelchair assist level: Dependent - Patient 0%      Wheelchair 50 feet with 2 turns activity    Assist        Assist Level: Dependent - Patient 0%   Wheelchair 150 feet activity     Assist      Assist Level: Dependent - Patient 0%   Blood pressure (!) 131/55, pulse  79, temperature 98.1 F (36.7 C), resp. rate 18, height 4' 11.5 (1.511 m), weight 81.5 kg, SpO2 98%.  Medical Problem List and Plan: 1. Functional deficits secondary to acute right frontal and left thalamic CVA             -patient may shower             -ELOS/Goals: 2/7 mod I             -Continue CIR   2.  Antithrombotics: -DVT/anticoagulation:  Pharmaceutical: Lovenox  30 mg daily             -antiplatelet therapy: continue Aspirin  and Plavix  for three weeks followed by aspirin  alone   3. Pain Management: Tylenol  as needed  4. Mood/Behavior/Sleep: LCSW to evaluate and provide emotional support             -antipsychotic agents: n/a   5. Neuropsych/cognition: This patient is capable of making decisions on her own behalf.   6. Skin/Wound Care: Routine skin care checks   7. Fluids/Electrolytes/Nutrition: Routine Is and Os and follow-up chemistries             -continue B12 supplement daily   8: Hypertension: monitor TID and prn     03/09/2023    5:52 AM 03/08/2023    7:34 PM 03/08/2023    6:15 PM  Vitals with BMI  Systolic 131 126 91  Diastolic 55 40 66  Pulse 79 80 86   2/7 controlled     9: Hypothyroidism: continue Synthroid    10: CKD: baseline creatinine ~2.0                 Latest Ref Rng & Units 03/08/2023    5:30 AM 03/07/2023    5:16 AM 03/05/2023    5:22 AM  BMP  Glucose 70 - 99 mg/dL 91  87  95   BUN 8 - 23 mg/dL 34  37  37   Creatinine 0.44 - 1.00 mg/dL 7.73  7.66  7.51   Sodium 135 - 145 mmol/L 139  136  136   Potassium 3.5 - 5.1 mmol/L 4.7  4.8  4.8   Chloride 98 - 111 mmol/L 115  109  109   CO2 22 - 32 mmol/L 18  19  22    Calcium  8.9 - 10.3 mg/dL 9.5  9.6  9.8    Creatinine at baseline BUN is mildly elevated , will d/c IVF and enc po fluids  11: Chronic anemia: H and H stable; follow-upHgb improving     Latest Ref Rng & Units 03/05/2023    5:22 AM 03/01/2023  5:46 AM 02/24/2023    5:14 AM  CBC  WBC 4.0 - 10.5 K/uL 5.8  6.1  6.0   Hemoglobin 12.0 -  15.0 g/dL 89.9  88.8  89.2   Hematocrit 36.0 - 46.0 % 31.4  34.8  33.8   Platelets 150 - 400 K/uL 252  253  222   2/3 hemoglobin a little lower at 10.0, appears overall stable , may be dilutional with IVF  12. Morbid obesity: provide dietary education   13.  Severe glenohumeral osteoarthritis will consider repeat steroid injection if this interferes with OT/PT  RIght shoulder pain with elevation , chronic, Sports med visit 01/20/20 Hx suboccipital decompression for Chiari malformation 2002  C spine MRI 2020,  no cervical spinal stenosis 12/22/[place]- severe glenohumeral osteoarthritis  F/u as OP with Sports Med  LOS: 9 days A FACE TO FACE EVALUATION WAS PERFORMED  Prentice FORBES [place] 03/09/2023, 7:59 AM

## 2023-03-09 NOTE — Progress Notes (Signed)
 Inpatient Rehabilitation Care Coordinator Discharge Note   Patient Details  Name: Lynn Ray MRN: [id number] Date of Birth: [birth year]   Discharge location: HOME WITH DAUGHGER BEING MAIN CAREGIVER-EVENTUALLY MAY GO BACK TO DAUGHTER IS THERE TH-SAT AND SOMEONE WITH EVERY NIGHT  Length of Stay: 9 DAYS  Discharge activity level: MOD/I LEVEL  Home/community participation: ACTIVE  Patient response un:Yzjouy Literacy - How often do you need to have someone help you when you read instructions, pamphlets, or other written material from your doctor or pharmacy?: Never  Patient response un:Dnrpjo Isolation - How often do you feel lonely or isolated from those around you?: Rarely  Services provided included: MD, RD, OT, PT, SLP, RN, CM, Pharmacy, SW  Financial Services:  Field Seismologist Utilized: Scientific Laboratory Technician MEDICARE  Choices offered to/list presented to: PT AND DAUGHTER  Follow-up services arranged:  Home Health, DME, Patient/Family has no preference for HH/DME agencies Home Health Agency: [facility]  PT & OT    DME : [facility]    Patient response to transportation need: Is the patient able to respond to transportation needs?: Yes In the past 12 months, has lack of transportation kept you from medical appointments or from getting medications?: No In the past 12 months, has lack of transportation kept you from meetings, work, or from getting things needed for daily living?: No   Patient/Family verbalized understanding of follow-up arrangements:  Yes  Individual responsible for coordination of the follow-up plan: PAULA-DAUGHTER [phone]  Confirmed correct DME delivered: Raymonde Asberry MATSU 03/09/2023    Comments (or additional information):PT DID WELL AN DAUGHTER WAS HERE THE WHOLE TIME. BOTH FEEL READY TO GO HOME  Summary of Stay    Date/Time Discharge Planning CSW  03/07/23 [phone] HOme with daughter who is there TH-Sat and then has  someone at night Sun-TH. Alone during the day Sun-Wed. Seems to need 24/7 due to speech issues. Will discuss with daughter and await team's recommendations RGD       Tzippy Testerman, Asberry MATSU

## 2023-03-10 DIAGNOSIS — J449 Chronic obstructive pulmonary disease, unspecified: Secondary | ICD-10-CM | POA: Diagnosis not present

## 2023-03-10 DIAGNOSIS — M19011 Primary osteoarthritis, right shoulder: Secondary | ICD-10-CM | POA: Diagnosis not present

## 2023-03-10 DIAGNOSIS — N184 Chronic kidney disease, stage 4 (severe): Secondary | ICD-10-CM | POA: Diagnosis not present

## 2023-03-10 DIAGNOSIS — I69392 Facial weakness following cerebral infarction: Secondary | ICD-10-CM | POA: Diagnosis not present

## 2023-03-10 DIAGNOSIS — I69354 Hemiplegia and hemiparesis following cerebral infarction affecting left non-dominant side: Secondary | ICD-10-CM | POA: Diagnosis not present

## 2023-03-10 DIAGNOSIS — E1122 Type 2 diabetes mellitus with diabetic chronic kidney disease: Secondary | ICD-10-CM | POA: Diagnosis not present

## 2023-03-10 DIAGNOSIS — F32A Depression, unspecified: Secondary | ICD-10-CM | POA: Diagnosis not present

## 2023-03-10 DIAGNOSIS — I129 Hypertensive chronic kidney disease with stage 1 through stage 4 chronic kidney disease, or unspecified chronic kidney disease: Secondary | ICD-10-CM | POA: Diagnosis not present

## 2023-03-12 ENCOUNTER — Telehealth: Payer: Self-pay | Admitting: Internal Medicine

## 2023-03-12 DIAGNOSIS — I69392 Facial weakness following cerebral infarction: Secondary | ICD-10-CM | POA: Diagnosis not present

## 2023-03-12 DIAGNOSIS — E1122 Type 2 diabetes mellitus with diabetic chronic kidney disease: Secondary | ICD-10-CM | POA: Diagnosis not present

## 2023-03-12 DIAGNOSIS — N184 Chronic kidney disease, stage 4 (severe): Secondary | ICD-10-CM | POA: Diagnosis not present

## 2023-03-12 DIAGNOSIS — M19011 Primary osteoarthritis, right shoulder: Secondary | ICD-10-CM | POA: Diagnosis not present

## 2023-03-12 DIAGNOSIS — I69354 Hemiplegia and hemiparesis following cerebral infarction affecting left non-dominant side: Secondary | ICD-10-CM | POA: Diagnosis not present

## 2023-03-12 DIAGNOSIS — I129 Hypertensive chronic kidney disease with stage 1 through stage 4 chronic kidney disease, or unspecified chronic kidney disease: Secondary | ICD-10-CM | POA: Diagnosis not present

## 2023-03-12 DIAGNOSIS — F32A Depression, unspecified: Secondary | ICD-10-CM | POA: Diagnosis not present

## 2023-03-12 DIAGNOSIS — J449 Chronic obstructive pulmonary disease, unspecified: Secondary | ICD-10-CM | POA: Diagnosis not present

## 2023-03-12 NOTE — Telephone Encounter (Signed)
Copied from CRM [id number]. Topic: Clinical - Home Health Verbal Orders >> Mar 12, 2023 10:05 AM [place] J wrote: Caller/Agency: Randa Ngo Number: [phone] Service Requested: Physical Therapy Frequency: 1x a week for 9 weeks Any new concerns about the patient? Yes

## 2023-03-12 NOTE — Telephone Encounter (Signed)
 Ok for verbals but will need to keep upcoming visit on 2/14

## 2023-03-14 DIAGNOSIS — E1122 Type 2 diabetes mellitus with diabetic chronic kidney disease: Secondary | ICD-10-CM | POA: Diagnosis not present

## 2023-03-14 DIAGNOSIS — F32A Depression, unspecified: Secondary | ICD-10-CM | POA: Diagnosis not present

## 2023-03-14 DIAGNOSIS — J449 Chronic obstructive pulmonary disease, unspecified: Secondary | ICD-10-CM | POA: Diagnosis not present

## 2023-03-14 DIAGNOSIS — I129 Hypertensive chronic kidney disease with stage 1 through stage 4 chronic kidney disease, or unspecified chronic kidney disease: Secondary | ICD-10-CM | POA: Diagnosis not present

## 2023-03-14 DIAGNOSIS — I69392 Facial weakness following cerebral infarction: Secondary | ICD-10-CM | POA: Diagnosis not present

## 2023-03-14 DIAGNOSIS — I69354 Hemiplegia and hemiparesis following cerebral infarction affecting left non-dominant side: Secondary | ICD-10-CM | POA: Diagnosis not present

## 2023-03-14 DIAGNOSIS — N184 Chronic kidney disease, stage 4 (severe): Secondary | ICD-10-CM | POA: Diagnosis not present

## 2023-03-14 DIAGNOSIS — M19011 Primary osteoarthritis, right shoulder: Secondary | ICD-10-CM | POA: Diagnosis not present

## 2023-03-14 NOTE — Telephone Encounter (Signed)
Called and lvm VS on secure chat

## 2023-03-16 ENCOUNTER — Telehealth: Payer: Self-pay

## 2023-03-16 ENCOUNTER — Encounter: Payer: Self-pay | Admitting: Internal Medicine

## 2023-03-16 ENCOUNTER — Ambulatory Visit: Payer: Medicare PPO | Admitting: Internal Medicine

## 2023-03-16 VITALS — BP 140/80 | HR 71 | Temp 98.2°F | Ht 59.5 in | Wt 179.0 lb

## 2023-03-16 DIAGNOSIS — E039 Hypothyroidism, unspecified: Secondary | ICD-10-CM | POA: Diagnosis not present

## 2023-03-16 DIAGNOSIS — Z8673 Personal history of transient ischemic attack (TIA), and cerebral infarction without residual deficits: Secondary | ICD-10-CM | POA: Diagnosis not present

## 2023-03-16 DIAGNOSIS — R6889 Other general symptoms and signs: Secondary | ICD-10-CM

## 2023-03-16 DIAGNOSIS — R7303 Prediabetes: Secondary | ICD-10-CM | POA: Diagnosis not present

## 2023-03-16 DIAGNOSIS — I10 Essential (primary) hypertension: Secondary | ICD-10-CM

## 2023-03-16 DIAGNOSIS — N184 Chronic kidney disease, stage 4 (severe): Secondary | ICD-10-CM

## 2023-03-16 LAB — POCT INFLUENZA A/B
Influenza A, POC: NEGATIVE
Influenza B, POC: NEGATIVE

## 2023-03-16 MED ORDER — ATORVASTATIN CALCIUM 40 MG PO TABS: 40.0000 mg | ORAL_TABLET | Freq: Every day | ORAL | 3 refills | Status: AC

## 2023-03-16 MED ORDER — METOPROLOL SUCCINATE ER 50 MG PO TB24: 50.0000 mg | ORAL_TABLET | Freq: Every day | ORAL | 3 refills | Status: DC

## 2023-03-16 NOTE — Assessment & Plan Note (Signed)
Stable labs at the hospital.

## 2023-03-16 NOTE — Telephone Encounter (Signed)
Can you call and add on orders for speech therapist assessment and treatment.

## 2023-03-16 NOTE — Assessment & Plan Note (Signed)
Stable on recent labs. Continue levothyroxine 100 mcg daily.

## 2023-03-16 NOTE — Assessment & Plan Note (Signed)
BMI 35 and complicated by hypertension and pre-diabetes.

## 2023-03-16 NOTE — Assessment & Plan Note (Signed)
She will continue plavix until gone then aspirin 81 mg daily alone. She is taking lipitor 40 mg daily without side effects. Needs to add speech therapy to home health and continue PT.

## 2023-03-16 NOTE — Progress Notes (Signed)
   Subjective:   Patient ID: Lynn Ray, female    DOB: [birth year], 89 y.o.   MRN: [id number]  HPI The patient is an 89 YO female coming in for hospital follow up (in for stroke then with some rehab, discharged recently on aspirin and plavix and on higher dose statin. She is having some difficulty with speech and interrupted speech. Did some preliminary speech therapy at rehab. Getting PT as well and OT assessed and she does not need OT services. Recent exposure to flu and some congestion.  PMH, [facility], social history reviewed and updated  Discharge medication list reconciliation reviewed and updated as appropriate.   Review of Systems  Constitutional: Negative.   HENT:  Positive for congestion.   Eyes: Negative.   Respiratory:  Negative for cough, chest tightness and shortness of breath.   Cardiovascular:  Negative for chest pain, palpitations and leg swelling.  Gastrointestinal:  Negative for abdominal distention, abdominal pain, constipation, diarrhea, nausea and vomiting.  Musculoskeletal: Negative.   Skin: Negative.   Neurological:  Positive for speech difficulty.  Psychiatric/Behavioral: Negative.      Objective:  Physical Exam Constitutional:      Appearance: She is well-developed.  HENT:     Head: Normocephalic and atraumatic.  Cardiovascular:     Rate and Rhythm: Normal rate and regular rhythm.  Pulmonary:     Effort: Pulmonary effort is normal. No respiratory distress.     Breath sounds: Normal breath sounds. No wheezing or rales.  Abdominal:     General: Bowel sounds are normal. There is no distension.     Palpations: Abdomen is soft.     Tenderness: There is no abdominal tenderness. There is no rebound.  Musculoskeletal:     Cervical back: Normal range of motion.  Skin:    General: Skin is warm and dry.  Neurological:     Mental Status: She is alert and oriented to person, place, and time.     Coordination: Coordination normal.     Vitals:   03/16/23 1007  03/16/23 1010  BP: (!) 140/80 (!) 140/80  Pulse: 71   Temp: 98.2 F (36.8 C)   TempSrc: Oral   SpO2: 96%   Weight: 179 lb (81.2 kg)   Height: 4' 11.5" (1.511 m)     Assessment & Plan:

## 2023-03-16 NOTE — Assessment & Plan Note (Signed)
Labs stable at hospital recently.

## 2023-03-16 NOTE — Assessment & Plan Note (Signed)
BP is elevated above goal and outside window for permissive hypertension. Will resume metoprolol 50 mg daily. (Previously on hydrochlorothiazide 12.5 mg daily as well will not resume now). Follow up 1 month.

## 2023-03-16 NOTE — Assessment & Plan Note (Signed)
POC flu testing done due to recent exposure which was negative.

## 2023-03-16 NOTE — Patient Instructions (Addendum)
Stop taking plavix which you run out. Keep taking aspirin 81 mg daily for life.  We have refilled the atorvastatin 40 mg daily to keep taking for life.  We will resume the metoprolol 1 pill daily for blood pressure.

## 2023-03-16 NOTE — Telephone Encounter (Signed)
Called and informed this about this to add on

## 2023-03-19 DIAGNOSIS — N184 Chronic kidney disease, stage 4 (severe): Secondary | ICD-10-CM | POA: Diagnosis not present

## 2023-03-19 DIAGNOSIS — J449 Chronic obstructive pulmonary disease, unspecified: Secondary | ICD-10-CM | POA: Diagnosis not present

## 2023-03-19 DIAGNOSIS — F32A Depression, unspecified: Secondary | ICD-10-CM | POA: Diagnosis not present

## 2023-03-19 DIAGNOSIS — I69392 Facial weakness following cerebral infarction: Secondary | ICD-10-CM | POA: Diagnosis not present

## 2023-03-19 DIAGNOSIS — E1122 Type 2 diabetes mellitus with diabetic chronic kidney disease: Secondary | ICD-10-CM | POA: Diagnosis not present

## 2023-03-19 DIAGNOSIS — M19011 Primary osteoarthritis, right shoulder: Secondary | ICD-10-CM | POA: Diagnosis not present

## 2023-03-19 DIAGNOSIS — I69354 Hemiplegia and hemiparesis following cerebral infarction affecting left non-dominant side: Secondary | ICD-10-CM | POA: Diagnosis not present

## 2023-03-19 DIAGNOSIS — I129 Hypertensive chronic kidney disease with stage 1 through stage 4 chronic kidney disease, or unspecified chronic kidney disease: Secondary | ICD-10-CM | POA: Diagnosis not present

## 2023-03-20 NOTE — Telephone Encounter (Signed)
That is probably okay.

## 2023-03-22 ENCOUNTER — Encounter: Payer: Medicare PPO | Admitting: Registered Nurse

## 2023-03-28 DIAGNOSIS — F32A Depression, unspecified: Secondary | ICD-10-CM | POA: Diagnosis not present

## 2023-03-28 DIAGNOSIS — M19011 Primary osteoarthritis, right shoulder: Secondary | ICD-10-CM | POA: Diagnosis not present

## 2023-03-28 DIAGNOSIS — E1122 Type 2 diabetes mellitus with diabetic chronic kidney disease: Secondary | ICD-10-CM | POA: Diagnosis not present

## 2023-03-28 DIAGNOSIS — I129 Hypertensive chronic kidney disease with stage 1 through stage 4 chronic kidney disease, or unspecified chronic kidney disease: Secondary | ICD-10-CM | POA: Diagnosis not present

## 2023-03-28 DIAGNOSIS — I69392 Facial weakness following cerebral infarction: Secondary | ICD-10-CM | POA: Diagnosis not present

## 2023-03-28 DIAGNOSIS — J449 Chronic obstructive pulmonary disease, unspecified: Secondary | ICD-10-CM | POA: Diagnosis not present

## 2023-03-28 DIAGNOSIS — N184 Chronic kidney disease, stage 4 (severe): Secondary | ICD-10-CM | POA: Diagnosis not present

## 2023-03-28 DIAGNOSIS — I69354 Hemiplegia and hemiparesis following cerebral infarction affecting left non-dominant side: Secondary | ICD-10-CM | POA: Diagnosis not present

## 2023-03-30 ENCOUNTER — Encounter: Payer: Self-pay | Admitting: Registered Nurse

## 2023-03-30 ENCOUNTER — Encounter: Payer: Medicare PPO | Attending: Registered Nurse | Admitting: Registered Nurse

## 2023-03-30 ENCOUNTER — Ambulatory Visit: Payer: Medicare PPO | Attending: Cardiology

## 2023-03-30 VITALS — BP 149/75 | HR 65 | Ht 59.5 in | Wt 181.5 lb

## 2023-03-30 DIAGNOSIS — E7849 Other hyperlipidemia: Secondary | ICD-10-CM | POA: Insufficient documentation

## 2023-03-30 DIAGNOSIS — I639 Cerebral infarction, unspecified: Secondary | ICD-10-CM

## 2023-03-30 NOTE — Progress Notes (Signed)
 Subjective:    Patient ID: Lynn Ray, female    DOB: 10/02/1934, 89 y.o.   MRN: [id number]  HPI: Lynn Ray is a 89 y.o. female who  is scheduled for HFU appointment for F/U of her Cerebrovascular Accident and Hyperlipidemia.   She arrived to [facility] ED on 02/22/2023, with left sided weaknesss and facial droop.   Dr. Toniann Fail H&P Note: 02/23/2023 HPI: Lynn Ray is a 89 y.o. female with history of hypertension, postoperative hypothyroidism, chronic kidney disease stage IV, chronic anemia, MGUS, prediabetes, rectal cancer was brought to the ER after patient was noticed to have difficulty expressing words.  As per patient's daughter who provided most of the history patient's son initially called yesterday morning at around 10 AM when patient talked normally.  At around 1 PM patient's son again called on the patient through the phone when patient's son felt that patient had some difficulty talking.  Later last evening when patient's daughter came to check on the patient patient was finding it difficult to express words.  Patient was brought to the ER for stroke workup.  There was also some concern for left facial droop.   MR Brain:  IMPRESSION: 1. Patchy acute ischemic infarct involving the parasagittal left frontal lobe. Minimal associated petechial blood products without hemorrhagic transformation or significant mass effect. 2. Additional subcentimeter acute to early subacute ischemic nonhemorrhagic infarcts involving the subcortical right frontal lobe and superior left thalamus. 3. Underlying age-related cerebral atrophy with chronic microvascular ischemic disease, with a few scattered remote lacunar infarcts about the hemispheric cerebral white matter and cerebellum.  MRA:  IMPRESSION: 1. No proximal intracranial large vessel occlusion or high-grade proximal arterial stenosis identified. 2. 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 2 mm vascular protrusion  arising from the right internal carotid artery at the petrous-cavernous junction, which may reflect an aneurysm or infundibulum. 2 mm vascular protrusion arising from the cavernous right internal carotid artery more distally, which may reflect an aneurysm or infundibulum. Neurointerventional consultation recommended.  Neurology was consulted: she was placed on aspirin and Plavix for 3 weeks then aspirin alone.   Lynn Ray was admitted to inpatient rehabilitation on 02/28/2023 and discharged home on 03/09/2023. She is receiving Home Health Therapy at [facility]. She denies any pain. She rates her pain 0.  Also reports she has a good appetite.    Pain Inventory Average Pain 0 Pain Right Now 0  BOWEL Number of stools per week: 7 Oral laxative use No   BLADDER Normal and Pads  Mobility walk without assistance ability to climb steps?  yes do you drive?  yes (was driving before stroke)  Function I need assistance with the following:  meal prep, household duties, and shopping  Neuro/Psych bladder control problems  Prior Studies Any changes since last visit?  yes  Has seen her PCP last Friday and has appt with neuro in April. She is having home physical therapy  Physicians involved in your care Any changes since last visit?  no   Family History  Problem Relation Age of Onset   Alzheimer's disease Mother    Stroke Mother        mini   Heart attack Mother    Hypertension Mother    Depression Other    Anemia Other    Obesity Other    Colon cancer Neg Hx    Esophageal cancer Neg Hx    Stomach cancer Neg Hx  Social History   Socioeconomic History   Marital status: Widowed    Spouse name: [name]   Number of children: 2   Years of education: Masters   Highest education level: Not on file  Occupational History   Occupation: retired    Comment: retired  Tobacco Use   Smoking status: Former    Current packs/day: 0.25    Types: Cigarettes   Smokeless  tobacco: Never   Tobacco comments:    04/2013,quit  Vaping Use   Vaping status: Never Used  Substance and Sexual Activity   Alcohol use: No    Alcohol/week: 0.0 standard drinks of alcohol   Drug use: No   Sexual activity: Not Currently  Other Topics Concern   Not on file  Social History Narrative   Patient consumes one cup of coffee daily   Social Drivers of Health   Financial Resource Strain: Low Risk  (10/22/2020)   Overall Financial Resource Strain (CARDIA)    Difficulty of Paying Living Expenses: Not hard at all  Food Insecurity: No Food Insecurity (02/23/2023)   Hunger Vital Sign    Worried About Running Out of Food in the Last Year: Never true    Ran Out of Food in the Last Year: Never true  Transportation Needs: No Transportation Needs (02/23/2023)   PRAPARE - Administrator, Civil Service (Medical): No    Lack of Transportation (Non-Medical): No  Physical Activity: Inactive (10/22/2020)   Exercise Vital Sign    Days of Exercise per Week: 0 days    Minutes of Exercise per Session: 0 min  Stress: No Stress Concern Present (10/22/2020)   [employer] of Occupational Health - Occupational Stress Questionnaire    Feeling of Stress : Not at all  Social Connections: Socially Integrated (02/23/2023)   Social Connection and Isolation Panel [NHANES]    Frequency of Communication with Friends and Family: More than three times a week    Frequency of Social Gatherings with Friends and Family: Once a week    Attends Religious Services: More than 4 times per year    Active Member of [employer] or Organizations: No    Attends Engineer, structural: More than 4 times per year    Marital Status: Married   Past Surgical History:  Procedure Laterality Date   CATARACT EXTRACTION Bilateral    chiari malformation  2013   surgery   COLONOSCOPY     FLEXIBLE SIGMOIDOSCOPY     POLYPECTOMY     Past Medical History:  Diagnosis Date   Arthritis    Cancer (HCC) 2004    rectal cancer   Cataract    right eye   Chiari malformation    CKD (chronic kidney disease), stage IV (HCC)    Depression    Diabetes mellitus (HCC)    "pre"   Hypertension    Sleep apnea    no CPAP   Thyroid disease    hyporthyroid   BP (!) 149/75   Pulse 65   Ht 4' 11.5" (1.511 m)   Wt 181 lb 8 oz (82.3 kg)   SpO2 99%   BMI 36.05 kg/m   Opioid Risk Score:   Fall Risk Score:  `1  Depression screen PHQ 2/9     03/30/2023    2:13 PM 07/21/2022    1:42 PM 11/25/2021    3:32 PM 08/16/2021    1:39 PM 10/22/2020    1:39 PM 09/28/2020    1:47 PM 07/04/2019  3:03 PM  Depression screen PHQ 2/9  Decreased Interest 0 0 0 0 0 0 1  Down, Depressed, Hopeless 0 0 0 0 0 0 0  PHQ - 2 Score 0 0 0 0 0 0 1  Altered sleeping 1 0  0     Tired, decreased energy 0 0  0     Change in appetite 0 0  0     Feeling bad or failure about yourself  0 0  0     Trouble concentrating 1 0  0     Moving slowly or fidgety/restless 0 0  0     Suicidal thoughts 0 0  0     PHQ-9 Score 2 0  0     Difficult doing work/chores  Not difficult at all         Review of Systems  All other systems reviewed and are negative.      Objective:   Physical Exam Vitals and nursing note reviewed.  Constitutional:      Appearance: Normal appearance. She is obese.  Cardiovascular:     Rate and Rhythm: Normal rate and regular rhythm.     Pulses: Normal pulses.     Heart sounds: Normal heart sounds.  Pulmonary:     Effort: Pulmonary effort is normal.     Breath sounds: Normal breath sounds.  Musculoskeletal:     Comments: Normal Muscle Bulk and Muscle Testing Reveals:  Upper Extremities: Full ROM and Muscle Strength 5/5  Lower Extremities: Full ROM and Muscle Strength 5/5 Arises from Table slowly Narrow Based  Gait     Skin:    General: Skin is warm and dry.  Neurological:     Mental Status: She is alert and oriented to person, place, and time.  Psychiatric:        Mood and Affect: Mood normal.         Behavior: Behavior normal.          Assessment & Plan:  Cerebrovascular Accident: She has a schedule appointment with Neurology. Continue Home Health Therapy with [facility]. Continue to Monitor.   Hyperlipidemia: Continue current medication regimen. PCP following. Continue to Monitor.   F/U with Dr Wynn Banker in 4- 6 weeks .

## 2023-03-30 NOTE — Patient Instructions (Signed)
 My- Chart:   [phone]

## 2023-04-02 DIAGNOSIS — M19011 Primary osteoarthritis, right shoulder: Secondary | ICD-10-CM | POA: Diagnosis not present

## 2023-04-02 DIAGNOSIS — N184 Chronic kidney disease, stage 4 (severe): Secondary | ICD-10-CM | POA: Diagnosis not present

## 2023-04-02 DIAGNOSIS — G4733 Obstructive sleep apnea (adult) (pediatric): Secondary | ICD-10-CM

## 2023-04-02 DIAGNOSIS — I359 Nonrheumatic aortic valve disorder, unspecified: Secondary | ICD-10-CM

## 2023-04-02 DIAGNOSIS — J449 Chronic obstructive pulmonary disease, unspecified: Secondary | ICD-10-CM | POA: Diagnosis not present

## 2023-04-02 DIAGNOSIS — I129 Hypertensive chronic kidney disease with stage 1 through stage 4 chronic kidney disease, or unspecified chronic kidney disease: Secondary | ICD-10-CM | POA: Diagnosis not present

## 2023-04-02 DIAGNOSIS — I69354 Hemiplegia and hemiparesis following cerebral infarction affecting left non-dominant side: Secondary | ICD-10-CM | POA: Diagnosis not present

## 2023-04-02 DIAGNOSIS — E1122 Type 2 diabetes mellitus with diabetic chronic kidney disease: Secondary | ICD-10-CM | POA: Diagnosis not present

## 2023-04-02 DIAGNOSIS — F32A Depression, unspecified: Secondary | ICD-10-CM | POA: Diagnosis not present

## 2023-04-02 DIAGNOSIS — I69392 Facial weakness following cerebral infarction: Secondary | ICD-10-CM | POA: Diagnosis not present

## 2023-04-02 DIAGNOSIS — D631 Anemia in chronic kidney disease: Secondary | ICD-10-CM

## 2023-04-03 ENCOUNTER — Other Ambulatory Visit: Payer: Self-pay | Admitting: Internal Medicine

## 2023-04-06 DIAGNOSIS — I129 Hypertensive chronic kidney disease with stage 1 through stage 4 chronic kidney disease, or unspecified chronic kidney disease: Secondary | ICD-10-CM | POA: Diagnosis not present

## 2023-04-06 DIAGNOSIS — F32A Depression, unspecified: Secondary | ICD-10-CM | POA: Diagnosis not present

## 2023-04-06 DIAGNOSIS — N184 Chronic kidney disease, stage 4 (severe): Secondary | ICD-10-CM | POA: Diagnosis not present

## 2023-04-06 DIAGNOSIS — E1122 Type 2 diabetes mellitus with diabetic chronic kidney disease: Secondary | ICD-10-CM | POA: Diagnosis not present

## 2023-04-06 DIAGNOSIS — M19011 Primary osteoarthritis, right shoulder: Secondary | ICD-10-CM | POA: Diagnosis not present

## 2023-04-06 DIAGNOSIS — J449 Chronic obstructive pulmonary disease, unspecified: Secondary | ICD-10-CM | POA: Diagnosis not present

## 2023-04-06 DIAGNOSIS — I69354 Hemiplegia and hemiparesis following cerebral infarction affecting left non-dominant side: Secondary | ICD-10-CM | POA: Diagnosis not present

## 2023-04-06 DIAGNOSIS — I69392 Facial weakness following cerebral infarction: Secondary | ICD-10-CM | POA: Diagnosis not present

## 2023-04-09 DIAGNOSIS — I129 Hypertensive chronic kidney disease with stage 1 through stage 4 chronic kidney disease, or unspecified chronic kidney disease: Secondary | ICD-10-CM | POA: Diagnosis not present

## 2023-04-09 DIAGNOSIS — M19011 Primary osteoarthritis, right shoulder: Secondary | ICD-10-CM | POA: Diagnosis not present

## 2023-04-09 DIAGNOSIS — I69354 Hemiplegia and hemiparesis following cerebral infarction affecting left non-dominant side: Secondary | ICD-10-CM | POA: Diagnosis not present

## 2023-04-09 DIAGNOSIS — F32A Depression, unspecified: Secondary | ICD-10-CM | POA: Diagnosis not present

## 2023-04-09 DIAGNOSIS — N184 Chronic kidney disease, stage 4 (severe): Secondary | ICD-10-CM | POA: Diagnosis not present

## 2023-04-09 DIAGNOSIS — J449 Chronic obstructive pulmonary disease, unspecified: Secondary | ICD-10-CM | POA: Diagnosis not present

## 2023-04-09 DIAGNOSIS — E1122 Type 2 diabetes mellitus with diabetic chronic kidney disease: Secondary | ICD-10-CM | POA: Diagnosis not present

## 2023-04-09 DIAGNOSIS — I69392 Facial weakness following cerebral infarction: Secondary | ICD-10-CM | POA: Diagnosis not present

## 2023-04-10 DIAGNOSIS — E1122 Type 2 diabetes mellitus with diabetic chronic kidney disease: Secondary | ICD-10-CM | POA: Diagnosis not present

## 2023-04-10 DIAGNOSIS — J449 Chronic obstructive pulmonary disease, unspecified: Secondary | ICD-10-CM | POA: Diagnosis not present

## 2023-04-10 DIAGNOSIS — F32A Depression, unspecified: Secondary | ICD-10-CM | POA: Diagnosis not present

## 2023-04-10 DIAGNOSIS — I69392 Facial weakness following cerebral infarction: Secondary | ICD-10-CM | POA: Diagnosis not present

## 2023-04-10 DIAGNOSIS — M19011 Primary osteoarthritis, right shoulder: Secondary | ICD-10-CM | POA: Diagnosis not present

## 2023-04-10 DIAGNOSIS — I69354 Hemiplegia and hemiparesis following cerebral infarction affecting left non-dominant side: Secondary | ICD-10-CM | POA: Diagnosis not present

## 2023-04-10 DIAGNOSIS — I129 Hypertensive chronic kidney disease with stage 1 through stage 4 chronic kidney disease, or unspecified chronic kidney disease: Secondary | ICD-10-CM | POA: Diagnosis not present

## 2023-04-10 DIAGNOSIS — N184 Chronic kidney disease, stage 4 (severe): Secondary | ICD-10-CM | POA: Diagnosis not present

## 2023-04-11 ENCOUNTER — Telehealth: Payer: Self-pay | Admitting: Cardiology

## 2023-04-11 NOTE — Telephone Encounter (Signed)
   Cardiac Monitor Alert  Date of alert:  04/11/2023   Patient Name: Lynn Ray  DOB: [birth year]  MRN: [id number]   [facility] Cardiologist: None  [facility] EP:  None    Monitor Information: Cardiac Event Monitor [[employer]]  Reason:  CVA Ordering provider:  Perlie Gold PA-C   Alert Pause(s) - Longest:  3.4 seconds This is the 1st alert for this rhythm.   Next Cardiology Appointment   Date:  04/26/2023  Provider: Dr.Tobb   The patient was contacted today.  She is asymptomatic. Arrhythmia, symptoms and history reviewed with Dr. Tresa Endo.   Plan:  Keep scheduled follow up appointment with Dr. Servando Salina on 04/26/2023   Benay Spice, LPN  6/57/8469 6:29 AM

## 2023-04-11 NOTE — Telephone Encounter (Signed)
 Called patient to follow up on monitor alert. No answer. Left message to call back.  Josie LPN

## 2023-04-11 NOTE — Telephone Encounter (Signed)
 Calling to report EKG results. Call transferred

## 2023-04-13 ENCOUNTER — Encounter: Payer: Self-pay | Admitting: Internal Medicine

## 2023-04-13 ENCOUNTER — Ambulatory Visit: Payer: Medicare PPO | Admitting: Internal Medicine

## 2023-04-13 VITALS — BP 128/86 | HR 60 | Temp 97.7°F | Ht 59.5 in | Wt 181.0 lb

## 2023-04-13 DIAGNOSIS — I10 Essential (primary) hypertension: Secondary | ICD-10-CM | POA: Diagnosis not present

## 2023-04-13 DIAGNOSIS — G8929 Other chronic pain: Secondary | ICD-10-CM | POA: Diagnosis not present

## 2023-04-13 DIAGNOSIS — Z8673 Personal history of transient ischemic attack (TIA), and cerebral infarction without residual deficits: Secondary | ICD-10-CM | POA: Diagnosis not present

## 2023-04-13 DIAGNOSIS — M25511 Pain in right shoulder: Secondary | ICD-10-CM | POA: Diagnosis not present

## 2023-04-13 DIAGNOSIS — J4489 Other specified chronic obstructive pulmonary disease: Secondary | ICD-10-CM | POA: Diagnosis not present

## 2023-04-13 NOTE — Assessment & Plan Note (Signed)
 No SOB or chronic cough and monitor for symptoms.

## 2023-04-13 NOTE — Patient Instructions (Signed)
 It is okay to try driving.

## 2023-04-13 NOTE — Assessment & Plan Note (Signed)
 Still working with PT and OT and SLP home health and improving.

## 2023-04-13 NOTE — Assessment & Plan Note (Signed)
 BP at goal on metoprolol 50 mg daily. Will not add back additional medications at this time.

## 2023-04-13 NOTE — Assessment & Plan Note (Signed)
 Overall stable and working with PT currently which has helped.

## 2023-04-13 NOTE — Progress Notes (Signed)
   Subjective:   Patient ID: Lynn Ray, female    DOB: September 06, 1934, 89 y.o.   MRN: [id number]  HPI The patient is an 89 YO female coming in for follow up. Still working with home health post stroke. Some improvement in function. Speech is still sluggish and starting with SLP soon.   Review of Systems  Constitutional: Negative.   HENT: Negative.    Eyes: Negative.   Respiratory:  Negative for cough, chest tightness and shortness of breath.   Cardiovascular:  Negative for chest pain, palpitations and leg swelling.  Gastrointestinal:  Negative for abdominal distention, abdominal pain, constipation, diarrhea, nausea and vomiting.  Musculoskeletal: Negative.   Skin: Negative.   Neurological:  Positive for speech difficulty and weakness.  Psychiatric/Behavioral: Negative.      Objective:  Physical Exam Constitutional:      Appearance: She is well-developed.  HENT:     Head: Normocephalic and atraumatic.  Cardiovascular:     Rate and Rhythm: Normal rate and regular rhythm.  Pulmonary:     Effort: Pulmonary effort is normal. No respiratory distress.     Breath sounds: Normal breath sounds. No wheezing or rales.  Abdominal:     General: Bowel sounds are normal. There is no distension.     Palpations: Abdomen is soft.     Tenderness: There is no abdominal tenderness. There is no rebound.  Musculoskeletal:     Cervical back: Normal range of motion.  Skin:    General: Skin is warm and dry.  Neurological:     Mental Status: She is alert and oriented to person, place, and time.     Coordination: Coordination normal.     Comments: Speech is fluid but slow to start     Vitals:   04/13/23 0929  BP: 128/86  Pulse: 60  Temp: 97.7 F (36.5 C)  TempSrc: Oral  SpO2: 99%  Weight: 181 lb (82.1 kg)  Height: 4' 11.5" (1.511 m)    Assessment & Plan:  Visit time 25 minutes in face to face communication with patient and coordination of care, additional 5 minutes spent in record review,  coordination or care, ordering tests, communicating/referring to other healthcare professionals, documenting in medical records all on the same day of the visit for total time 30 minutes spent on the visit.

## 2023-04-18 DIAGNOSIS — I69392 Facial weakness following cerebral infarction: Secondary | ICD-10-CM | POA: Diagnosis not present

## 2023-04-18 DIAGNOSIS — E1122 Type 2 diabetes mellitus with diabetic chronic kidney disease: Secondary | ICD-10-CM | POA: Diagnosis not present

## 2023-04-18 DIAGNOSIS — M19011 Primary osteoarthritis, right shoulder: Secondary | ICD-10-CM | POA: Diagnosis not present

## 2023-04-18 DIAGNOSIS — I129 Hypertensive chronic kidney disease with stage 1 through stage 4 chronic kidney disease, or unspecified chronic kidney disease: Secondary | ICD-10-CM | POA: Diagnosis not present

## 2023-04-18 DIAGNOSIS — I69354 Hemiplegia and hemiparesis following cerebral infarction affecting left non-dominant side: Secondary | ICD-10-CM | POA: Diagnosis not present

## 2023-04-18 DIAGNOSIS — F32A Depression, unspecified: Secondary | ICD-10-CM | POA: Diagnosis not present

## 2023-04-18 DIAGNOSIS — N184 Chronic kidney disease, stage 4 (severe): Secondary | ICD-10-CM | POA: Diagnosis not present

## 2023-04-18 DIAGNOSIS — J449 Chronic obstructive pulmonary disease, unspecified: Secondary | ICD-10-CM | POA: Diagnosis not present

## 2023-04-19 ENCOUNTER — Telehealth: Payer: Self-pay

## 2023-04-19 NOTE — Telephone Encounter (Signed)
   Cardiac Monitor Alert  Date of alert:  04/19/2023   Patient Name: Lynn Ray  DOB: [birth year]  MRN: [id number]   [facility] Cardiologist: None  [facility] EP:  None    Monitor Information: Cardiac Event Monitor [[employer]]  Reason:  4.7 second pause (possible artifact)  Ordering provider:  Dr Servando Salina  :1}  Alert Pause(s) - Longest:  4.7 seconds This is the 1st alert for this rhythm.   Next Cardiology Appointment   Date:  04/19/23  Provider:  Dr Lalla Brothers  The patient was contacted today.  She is asymptomatic. Arrhythmia, symptoms and history reviewed with Dr Lalla Brothers, possible artifact.   Plan:  No changes, keep follow up on 04/26/23   Other: Spoke with patient, denies any symptoms since monitor placement (denies any dizziness, palpitations, lightheadedness).   Festus Holts, RN  04/19/2023 12:41 PM

## 2023-04-20 ENCOUNTER — Telehealth: Payer: Self-pay

## 2023-04-20 DIAGNOSIS — R7303 Prediabetes: Secondary | ICD-10-CM | POA: Diagnosis not present

## 2023-04-20 DIAGNOSIS — I129 Hypertensive chronic kidney disease with stage 1 through stage 4 chronic kidney disease, or unspecified chronic kidney disease: Secondary | ICD-10-CM | POA: Diagnosis not present

## 2023-04-20 DIAGNOSIS — D472 Monoclonal gammopathy: Secondary | ICD-10-CM | POA: Diagnosis not present

## 2023-04-20 DIAGNOSIS — E039 Hypothyroidism, unspecified: Secondary | ICD-10-CM | POA: Diagnosis not present

## 2023-04-20 DIAGNOSIS — N184 Chronic kidney disease, stage 4 (severe): Secondary | ICD-10-CM | POA: Diagnosis not present

## 2023-04-20 NOTE — Telephone Encounter (Signed)
   Cardiac Monitor Alert  Date of alert:  04/20/2023   Patient Name: Lynn Ray  DOB: [birth year]  MRN: [id number]   [facility] Cardiologist: None  [facility] EP:  None    Monitor Information: Cardiac Event Monitor [[employer]]  Reason:  CVA Ordering provider:  PA Perlie Gold   Alert Pause(s) - Longest:  4.7 seconds This is the 1st alert for this rhythm.   Next Cardiology Appointment   Date:  04/26/23  Provider:  Dr. Servando Salina  The patient could NOT be reached by telephone today.  Called twice, no answer Arrhythmia, symptoms and history reviewed with DOD Dr. Bjorn Pippin.   Plan:  Stop Metoprolol S 50mg , follow up at 3/27 OV    Other: Attempted to call patient x2, no answer, unable to leave voicemail.   [name], RN  04/20/2023 9:45 AM

## 2023-04-24 ENCOUNTER — Encounter: Payer: Self-pay | Admitting: Cardiology

## 2023-04-24 ENCOUNTER — Other Ambulatory Visit: Payer: Self-pay | Admitting: Internal Medicine

## 2023-04-24 DIAGNOSIS — F32A Depression, unspecified: Secondary | ICD-10-CM | POA: Diagnosis not present

## 2023-04-24 DIAGNOSIS — I69392 Facial weakness following cerebral infarction: Secondary | ICD-10-CM | POA: Diagnosis not present

## 2023-04-24 DIAGNOSIS — E1122 Type 2 diabetes mellitus with diabetic chronic kidney disease: Secondary | ICD-10-CM | POA: Diagnosis not present

## 2023-04-24 DIAGNOSIS — I69354 Hemiplegia and hemiparesis following cerebral infarction affecting left non-dominant side: Secondary | ICD-10-CM | POA: Diagnosis not present

## 2023-04-24 DIAGNOSIS — M19011 Primary osteoarthritis, right shoulder: Secondary | ICD-10-CM | POA: Diagnosis not present

## 2023-04-24 DIAGNOSIS — J449 Chronic obstructive pulmonary disease, unspecified: Secondary | ICD-10-CM | POA: Diagnosis not present

## 2023-04-24 DIAGNOSIS — N184 Chronic kidney disease, stage 4 (severe): Secondary | ICD-10-CM | POA: Diagnosis not present

## 2023-04-24 DIAGNOSIS — I129 Hypertensive chronic kidney disease with stage 1 through stage 4 chronic kidney disease, or unspecified chronic kidney disease: Secondary | ICD-10-CM | POA: Diagnosis not present

## 2023-04-24 MED ORDER — LEVOTHYROXINE SODIUM 100 MCG PO TABS: 100.0000 ug | ORAL_TABLET | Freq: Every day | ORAL | 0 refills | Status: DC

## 2023-04-24 NOTE — Telephone Encounter (Signed)
 Patient identification verified by 2 forms. Lynn Rail, RN    Called and spoke to patient  Patient states:   -she has been feeling well   -no issues or concerns   -is hesitant about d/c Metoprolol   -Metoprolol is for her BP, does not want BP to increase  Patient denies:  -lightheadedness/dizziness   -heart palpitations  Informed patient:  -Message sent to Dr. Servando Salina for input   -keep 3/27 OV for follow up  Reviewed ED warning signs/precautions  Patient verbalized understanding, no questions at this time

## 2023-04-24 NOTE — Telephone Encounter (Signed)
 Copied from CRM [id number]. Topic: Clinical - Medication Refill >> Apr 24, 2023 12:59 PM Rodman Pickle T wrote: Most Recent Primary Care Visit:  Provider: Hillard Danker A  Department: [facility]  Visit Type: OFFICE VISIT  Date: 04/13/2023  Medication: levothyroxine (SYNTHROID) 100 MCG tablet  Has the patient contacted their pharmacy? Yes (Agent: If no, request that the patient contact the pharmacy for the refill. If patient does not wish to contact the pharmacy document the reason why and proceed with request.) (Agent: If yes, when and what did the pharmacy advise?)  Is this the correct pharmacy for this prescription? Yes If no, delete pharmacy and type the correct one.  This is the patient's preferred pharmacy:  [id number] - [place], [place] - [address] AT [facility] [address] [place] [address] Phone: [phone] Fax: [phone]   Has the prescription been filled recently? No  Is the patient out of the medication? Yes  Has the patient been seen for an appointment in the last year OR does the patient have an upcoming appointment? Yes  Can we respond through MyChart? No  Agent: Please be advised that Rx refills may take up to 3 business days. We ask that you follow-up with your pharmacy.

## 2023-04-26 ENCOUNTER — Encounter: Payer: Self-pay | Admitting: Cardiology

## 2023-04-26 ENCOUNTER — Ambulatory Visit: Payer: Medicare PPO | Attending: Cardiology | Admitting: Cardiology

## 2023-04-26 VITALS — BP 158/80 | HR 86 | Ht 59.0 in | Wt 180.6 lb

## 2023-04-26 DIAGNOSIS — E782 Mixed hyperlipidemia: Secondary | ICD-10-CM | POA: Diagnosis not present

## 2023-04-26 DIAGNOSIS — Z8673 Personal history of transient ischemic attack (TIA), and cerebral infarction without residual deficits: Secondary | ICD-10-CM | POA: Diagnosis not present

## 2023-04-26 DIAGNOSIS — I455 Other specified heart block: Secondary | ICD-10-CM | POA: Diagnosis not present

## 2023-04-26 DIAGNOSIS — I10 Essential (primary) hypertension: Secondary | ICD-10-CM

## 2023-04-26 NOTE — Patient Instructions (Signed)
 Medication Instructions:  Your physician has recommended you make the following change in your medication:  START: Amlodipine 2.5 mg once daily *If you need a refill on your cardiac medications before your next appointment, please call your pharmacy*   Follow-Up: At [facility], you and your health needs are our priority.  As part of our continuing mission to provide you with exceptional heart care, our providers are all part of one team.  This team includes your primary Cardiologist (physician) and Advanced Practice Providers or APPs (Physician Assistants and Nurse Practitioners) who all work together to provide you with the care you need, when you need it.  Your next appointment:   16 week(s)  Provider:   Thomasene Ripple, DO     We recommend signing up for the patient portal called "MyChart".  Sign up information is provided on this After Visit Summary.  MyChart is used to connect with patients for Virtual Visits (Telemedicine).  Patients are able to view lab/test results, encounter notes, upcoming appointments, etc.  Non-urgent messages can be sent to your provider as well.   To learn more about what you can do with MyChart, go to [email or web address].   Other Instructions       1st Floor: - Lobby - Registration  - Pharmacy  - Lab - Cafe  2nd Floor: - PV Lab - Diagnostic Testing (echo, CT, nuclear med)  3rd Floor: - Vacant  4th Floor: - TCTS (cardiothoracic surgery) - AFib Clinic - Structural Heart Clinic - Vascular Surgery  - Vascular Ultrasound  5th Floor: - HeartCare Cardiology (general and EP) - Clinical Pharmacy for coumadin, hypertension, lipid, weight-loss medications, and med management appointments    Valet parking services will be available as well.

## 2023-04-27 ENCOUNTER — Encounter: Payer: Self-pay | Admitting: Physical Medicine & Rehabilitation

## 2023-04-27 ENCOUNTER — Encounter: Payer: Medicare PPO | Attending: Registered Nurse | Admitting: Physical Medicine & Rehabilitation

## 2023-04-27 VITALS — BP 139/79 | HR 73 | Wt 179.4 lb

## 2023-04-27 DIAGNOSIS — I69920 Aphasia following unspecified cerebrovascular disease: Secondary | ICD-10-CM | POA: Insufficient documentation

## 2023-04-27 NOTE — Progress Notes (Signed)
 Subjective:    Patient ID: Lynn Ray, female    DOB: [birth year], 89 y.o.   MRN: [id number] 89 year old female who preented to the ED on 02/22/2023 with left sided weakness and facial droop. MRI brain revealed an acute infarct in the right frontal lobe and left thalamus. Neurology consulted and placed on aspirin and Plavix. MRA of head and neck performed with no LVO. No significant carotid artery stenosis. 2D echo revealed ejection fraction by PLAX is 59%. LDL 84. Hemoglobin A1c 5.8. She was started on Lovenox subcutaneously for VTE prophylaxis. She will continue on DAPT for 3 weeks then aspirin alone. She is tolerating heart healthy diet with thin liquids. Past medical history includes depression, hypothyroidism, chronic kidney disease, anemia.  Admit date: 02/28/2023 Discharge date: 03/09/2023 HPI PT and SLP Mod I dressing and bathing  Light meal prep  No falls at home Has caregiver at night  Daytime home by herself   Patient denies any numbness or tingling or weakness in her arms or legs.  No swallowing issues She feels like from a cognitive standpoint she is having some difficulty thinking of words. She does not feel comfortable driving the car yet although she did try driving with her family member once and it went okay.  She was just in a empty parking lot  Pain Inventory Average Pain 2 Pain Right Now 1 My pain is intermittent, burning, and aching  LOCATION OF PAIN  Shoulder  BOWEL Number of stools per week: everyday Oral laxative use No  Type of laxative none Enema or suppository use No  History of colostomy No  Incontinent No   BLADDER Normal In and out cath, frequency none  Able to self cath  n/a Bladder incontinence No  Frequent urination No  Leakage with coughing No  Difficulty starting stream No  Incomplete bladder emptying No    Mobility use a cane  Function retired  Neuro/Psych No problems in this area  Prior Studies Any changes since last visit?   no  Physicians involved in your care Any changes since last visit?  no   Family History  Problem Relation Age of Onset   Alzheimer's disease Mother    Stroke Mother        mini   Heart attack Mother    Hypertension Mother    Depression Other    Anemia Other    Obesity Other    Colon cancer Neg Hx    Esophageal cancer Neg Hx    Stomach cancer Neg Hx    Social History   Socioeconomic History   Marital status: Widowed    Spouse name: [name]   Number of children: 2   Years of education: Masters   Highest education level: Not on file  Occupational History   Occupation: retired    Comment: retired  Tobacco Use   Smoking status: Former    Current packs/day: 0.25    Types: Cigarettes   Smokeless tobacco: Never   Tobacco comments:    04/2013,quit  Vaping Use   Vaping status: Never Used  Substance and Sexual Activity   Alcohol use: No    Alcohol/week: 0.0 standard drinks of alcohol   Drug use: No   Sexual activity: Not Currently  Other Topics Concern   Not on file  Social History Narrative   Patient consumes one cup of coffee daily   Social Drivers of Health   Financial Resource Strain: Low Risk  (10/22/2020)   Overall Financial  Resource Strain (CARDIA)    Difficulty of Paying Living Expenses: Not hard at all  Food Insecurity: No Food Insecurity (02/23/2023)   Hunger Vital Sign    Worried About Running Out of Food in the Last Year: Never true    Ran Out of Food in the Last Year: Never true  Transportation Needs: No Transportation Needs (02/23/2023)   PRAPARE - Administrator, Civil Service (Medical): No    Lack of Transportation (Non-Medical): No  Physical Activity: Inactive (10/22/2020)   Exercise Vital Sign    Days of Exercise per Week: 0 days    Minutes of Exercise per Session: 0 min  Stress: No Stress Concern Present (10/22/2020)   [employer] of Occupational Health - Occupational Stress Questionnaire    Feeling of Stress : Not at all  Social  Connections: Socially Integrated (02/23/2023)   Social Connection and Isolation Panel [NHANES]    Frequency of Communication with Friends and Family: More than three times a week    Frequency of Social Gatherings with Friends and Family: Once a week    Attends Religious Services: More than 4 times per year    Active Member of [employer] or Organizations: No    Attends Engineer, structural: More than 4 times per year    Marital Status: Married   Past Surgical History:  Procedure Laterality Date   CATARACT EXTRACTION Bilateral    chiari malformation  2013   surgery   COLONOSCOPY     FLEXIBLE SIGMOIDOSCOPY     POLYPECTOMY     Past Medical History:  Diagnosis Date   Arthritis    Cancer (HCC) 2004   rectal cancer   Cataract    right eye   Chiari malformation    CKD (chronic kidney disease), stage IV (HCC)    Depression    Diabetes mellitus (HCC)    "pre"   Hypertension    Sleep apnea    no CPAP   Thyroid disease    hyporthyroid   There were no vitals taken for this visit.  Opioid Risk Score:   Fall Risk Score:  `1  Depression screen PHQ 2/9     03/30/2023    2:13 PM 07/21/2022    1:42 PM 11/25/2021    3:32 PM 08/16/2021    1:39 PM 10/22/2020    1:39 PM 09/28/2020    1:47 PM 07/04/2019    3:03 PM  Depression screen PHQ 2/9  Decreased Interest 0 0 0 0 0 0 1  Down, Depressed, Hopeless 0 0 0 0 0 0 0  PHQ - 2 Score 0 0 0 0 0 0 1  Altered sleeping 1 0  0     Tired, decreased energy 0 0  0     Change in appetite 0 0  0     Feeling bad or failure about yourself  0 0  0     Trouble concentrating 1 0  0     Moving slowly or fidgety/restless 0 0  0     Suicidal thoughts 0 0  0     PHQ-9 Score 2 0  0     Difficult doing work/chores  Not difficult at all         Review of Systems     Objective:   Physical Exam General No acute distress Mood and affect appropriate Extremities without edema Negative straight leg raising bilaterally Motor strength is 5/5 bilateral  deltoid, bicep, tricep, grip,  hip flexor, knee extensor, ankle dorsiflexion plantarflexion Sensation intact light touch bilateral upper and lower limb There is no evidence of facial droop Extraocular muscles are intact Visual fields are intact confrontation testing There is occasional mild anomia but this is fairly rare Processing speed appears to be normal   Gait velocity is slow she uses a cane wide-based support no evidence of toe drag or knee instability     Assessment & Plan:  1.  History of left thalamic and right frontal lobe infarcts.  We discussed her residual stroke deficits mainly in the form of anomia.  Because her stroke was not in the primary speech area I would expect this should improve over the course of several months.  I do think physical therapy and speech therapy should be helpful for her speech problems as well as increased balance and gait velocity. She will need to follow-up with neurology, cardiology as well as PCP I do not think she needs follow-up with PMNR. If she wishes to pursue outpatient therapy once she starts driving she can call this office we can call in some orders for her.

## 2023-04-29 NOTE — Progress Notes (Signed)
 Cardiology Office Note:    Date:  04/29/2023   ID:  Lynn Ray, DOB 24-Jun-1934, MRN [id number]  PCP:  [name], MD  Cardiologist:  None  Electrophysiologist:  None   Referring MD: [name], *   " I am ok"   History of Present Illness:    Lynn Ray is a 89 y.o. female  Discussed the use of AI scribe software for clinical note transcription with the patient, who gave verbal consent to proceed.  The patient, with a history of hypertension and a recent stroke, presents with concerns about being taken off her blood pressure medication, metoprolol. Prior to this visit - there is a phone note that her monitor showed 4.7 seconds pause and her beta blocker was stopped. She was surprised by this instruction, given her recent stroke, and questioned the decision. However, she did not receive a follow-up call as promised. The patient denies any heart palpitations and reports no problems with her heart. She also mentions that she has been feeling tired, which her daughter attributes to a potentially low heart rate due to the metoprolol. The patient has been wearing a heart monitor for the past two weeks, which is due to be removed and returned for analysis.       Past Medical History:  Diagnosis Date   Arthritis    Cancer (HCC) 2004   rectal cancer   Cataract    right eye   Chiari malformation    CKD (chronic kidney disease), stage IV (HCC)    Depression    Diabetes mellitus (HCC)    "pre"   Hypertension    Sleep apnea    no CPAP   Thyroid disease    hyporthyroid    Past Surgical History:  Procedure Laterality Date   CATARACT EXTRACTION Bilateral    chiari malformation  2013   surgery   COLONOSCOPY     FLEXIBLE SIGMOIDOSCOPY     POLYPECTOMY      Current Medications: Current Meds  Medication Sig   acetaminophen (TYLENOL) 325 MG tablet Take 1-2 tablets (325-650 mg total) by mouth every 4 (four) hours as needed for mild pain (pain score 1-3).    aspirin EC 81 MG tablet Take 1 tablet (81 mg total) by mouth daily.   atorvastatin (LIPITOR) 40 MG tablet Take 1 tablet (40 mg total) by mouth daily.   Carboxymethylcellulose Sodium ([employer] LUBRICANT EYE DROPS OP) Apply 2 drops to eye 2 (two) times daily as needed.   cholecalciferol (VITAMIN D3) 25 MCG (1000 UT) tablet Take 1,000 Units by mouth daily.   levothyroxine (SYNTHROID) 100 MCG tablet Take 1 tablet (100 mcg total) by mouth daily. Follow-up due in June with labs   vitamin B-12 (CYANOCOBALAMIN) 100 MCG tablet Take 100 mcg by mouth daily.     Allergies:   Aspirin and Tape   Social History   Socioeconomic History   Marital status: Widowed    Spouse name: Lynn Ray   Number of children: 2   Years of education: Masters   Highest education level: Not on file  Occupational History   Occupation: retired    Comment: retired  Tobacco Use   Smoking status: Former    Current packs/day: 0.25    Types: Cigarettes   Smokeless tobacco: Never   Tobacco comments:    04/2013,quit  Vaping Use   Vaping status: Never Used  Substance and Sexual Activity   Alcohol use: No    Alcohol/week: 0.0 standard  drinks of alcohol   Drug use: No   Sexual activity: Not Currently  Other Topics Concern   Not on file  Social History Narrative   Patient consumes one cup of coffee daily   Social Drivers of Health   Financial Resource Strain: Low Risk  (10/22/2020)   Overall Financial Resource Strain (CARDIA)    Difficulty of Paying Living Expenses: Not hard at all  Food Insecurity: No Food Insecurity (02/23/2023)   Hunger Vital Sign    Worried About Running Out of Food in the Last Year: Never true    Ran Out of Food in the Last Year: Never true  Transportation Needs: No Transportation Needs (02/23/2023)   PRAPARE - Administrator, Civil Service (Medical): No    Lack of Transportation (Non-Medical): No  Physical Activity: Inactive (10/22/2020)   Exercise Vital Sign    Days of Exercise per Week: 0  days    Minutes of Exercise per Session: 0 min  Stress: No Stress Concern Present (10/22/2020)   [employer] of Occupational Health - Occupational Stress Questionnaire    Feeling of Stress : Not at all  Social Connections: Socially Integrated (02/23/2023)   Social Connection and Isolation Panel [NHANES]    Frequency of Communication with Friends and Family: More than three times a week    Frequency of Social Gatherings with Friends and Family: Once a week    Attends Religious Services: More than 4 times per year    Active Member of [employer] or Organizations: No    Attends Engineer, structural: More than 4 times per year    Marital Status: Married     Family History: The patient's family history includes Alzheimer's disease in her mother; Anemia in an other family member; Depression in an other family member; Heart attack in her mother; Hypertension in her mother; Obesity in an other family member; Stroke in her mother. There is no history of Colon cancer, Esophageal cancer, or Stomach cancer.  ROS:   Review of Systems  Constitution: Negative for decreased appetite, fever and weight gain.  HENT: Negative for congestion, ear discharge, hoarse voice and sore throat.   Eyes: Negative for discharge, redness, vision loss in right eye and visual halos.  Cardiovascular: Negative for chest pain, dyspnea on exertion, leg swelling, orthopnea and palpitations.  Respiratory: Negative for cough, hemoptysis, shortness of breath and snoring.   Endocrine: Negative for heat intolerance and polyphagia.  Hematologic/Lymphatic: Negative for bleeding problem. Does not bruise/bleed easily.  Skin: Negative for flushing, nail changes, rash and suspicious lesions.  Musculoskeletal: Negative for arthritis, joint pain, muscle cramps, myalgias, neck pain and stiffness.  Gastrointestinal: Negative for abdominal pain, bowel incontinence, diarrhea and excessive appetite.  Genitourinary: Negative for  decreased libido, genital sores and incomplete emptying.  Neurological: Negative for brief paralysis, focal weakness, headaches and loss of balance.  Psychiatric/Behavioral: Negative for altered mental status, depression and suicidal ideas.  Allergic/Immunologic: Negative for HIV exposure and persistent infections.    EKGs/Labs/Other Studies Reviewed:    The following studies were reviewed today:   EKG:  The ekg ordered today demonstrates   Recent Labs: 07/21/2022: TSH 0.18 02/24/2023: Magnesium 2.1 03/01/2023: ALT 11 03/05/2023: Hemoglobin 10.0; Platelets 252 03/08/2023: BUN 34; Creatinine, Ser 2.26; Potassium 4.7; Sodium 139  Recent Lipid Panel    Component Value Date/Time   CHOL 156 02/23/2023 0458   TRIG 115 02/23/2023 0458   HDL 49 02/23/2023 0458   CHOLHDL 3.2 02/23/2023 0458  VLDL 23 02/23/2023 0458   LDLCALC 84 02/23/2023 0458   LDLDIRECT 78.0 07/21/2022 1416    Physical Exam:    VS:  BP (!) 158/80 (BP Location: Left Arm, Patient Position: Sitting, Cuff Size: Normal)   Pulse 86   Ht 4\' 11"  (1.499 m)   Wt 180 lb 9.6 oz (81.9 kg)   SpO2 100%   BMI 36.48 kg/m     Wt Readings from Last 3 Encounters:  04/27/23 179 lb 6.4 oz (81.4 kg)  04/26/23 180 lb 9.6 oz (81.9 kg)  04/13/23 181 lb (82.1 kg)     GEN: Well nourished, well developed in no acute distress HEENT: Normal NECK: No JVD; No carotid bruits LYMPHATICS: No lymphadenopathy CARDIAC: S1S2 noted,RRR, no murmurs, rubs, gallops RESPIRATORY:  Clear to auscultation without rales, wheezing or rhonchi  ABDOMEN: Soft, non-tender, non-distended, +bowel sounds, no guarding. EXTREMITIES: No edema, No cyanosis, no clubbing MUSCULOSKELETAL:  No deformity  SKIN: Warm and dry NEUROLOGIC:  Alert and oriented x 3, non-focal PSYCHIATRIC:  Normal affect, good insight  ASSESSMENT:    1. Primary hypertension   2. Sinus pause   3. History of CVA (cerebrovascular accident)   4. Mixed hyperlipidemia    PLAN:        Hypertension Suboptimal management due to metoprolol discontinuation.  - Prescribe amlodipine 2.5 mg daily. - Discontinue metoprolol, due to monitor notification - results still pending. - Monitor blood pressure and adjust medication dosage as needed.  Bradycardia Potential bradycardia from metoprolol causing fatigue and low heart rate. Discontinuation advised to maintain heart rate in the 80s. - Discontinue metoprolol. - Monitor heart rate and symptoms of fatigue.  Stroke Cardiac monitor in use to assess for arrhythmias post-stroke. Monitor due for removal and analysis. - Instruct her to remove cardiac monitor and mail it back for analysis. - Review cardiac monitor results once available.  Follow-up Necessary to assess hypertension management and review cardiac monitor results. - Schedule follow-up appointment in 16 weeks. - Contact her with cardiac monitor results once available.        The patient is in agreement with the above plan. The patient left the office in stable condition.  The patient will follow up in   Medication Adjustments/Labs and Tests Ordered: Current medicines are reviewed at length with the patient today.  Concerns regarding medicines are outlined above.  No orders of the defined types were placed in this encounter.  No orders of the defined types were placed in this encounter.   Patient Instructions  Medication Instructions:  Your physician has recommended you make the following change in your medication:  START: Amlodipine 2.5 mg once daily *If you need a refill on your cardiac medications before your next appointment, please call your pharmacy*   Follow-Up: At [facility], you and your health needs are our priority.  As part of our continuing mission to provide you with exceptional heart care, our providers are all part of one team.  This team includes your primary Cardiologist (physician) and Advanced Practice Providers or APPs (Physician  Assistants and Nurse Practitioners) who all work together to provide you with the care you need, when you need it.  Your next appointment:   16 week(s)  Provider:   Thomasene Ripple, DO     We recommend signing up for the patient portal called "MyChart".  Sign up information is provided on this After Visit Summary.  MyChart is used to connect with patients for Virtual Visits (Telemedicine).  Patients are  able to view lab/test results, encounter notes, upcoming appointments, etc.  Non-urgent messages can be sent to your provider as well.   To learn more about what you can do with MyChart, go to [email or web address].   Other Instructions       1st Floor: - Lobby - Registration  - Pharmacy  - Lab - Cafe  2nd Floor: - PV Lab - Diagnostic Testing (echo, CT, nuclear med)  3rd Floor: - Vacant  4th Floor: - TCTS (cardiothoracic surgery) - AFib Clinic - Structural Heart Clinic - Vascular Surgery  - Vascular Ultrasound  5th Floor: - [facility] (general and EP) - Clinical Pharmacy for coumadin, hypertension, lipid, weight-loss medications, and med management appointments    Valet parking services will be available as well.      Adopting a Healthy Lifestyle.  Know what a healthy weight is for you (roughly BMI <25) and aim to maintain this   Aim for 7+ servings of fruits and vegetables daily   65-80+ fluid ounces of water or unsweet tea for healthy kidneys   Limit to max 1 drink of alcohol per day; avoid smoking/tobacco   Limit animal fats in diet for cholesterol and heart health - choose grass fed whenever available   Avoid highly processed foods, and foods high in saturated/trans fats   Aim for low stress - take time to unwind and care for your mental health   Aim for 150 min of moderate intensity exercise weekly for heart health, and weights twice weekly for bone health   Aim for 7-9 hours of sleep daily   When it comes to diets, agreement about the  perfect plan isnt easy to find, even among the experts. Experts at the [facility] [employer] developed an idea known as the Healthy Eating Plate. Just imagine a plate divided into logical, healthy portions.   The emphasis is on diet quality:   Load up on vegetables and fruits - one-half of your plate: Aim for color and variety, and remember that potatoes dont count.   Go for whole grains - one-quarter of your plate: Whole wheat, barley, wheat berries, quinoa, oats, brown rice, and foods made with them. If you want pasta, go with whole wheat pasta.   Protein power - one-quarter of your plate: Fish, chicken, beans, and nuts are all healthy, versatile protein sources. Limit red meat.   The diet, however, does go beyond the plate, offering a few other suggestions.   Use healthy plant oils, such as olive, canola, soy, corn, sunflower and peanut. Check the labels, and avoid partially hydrogenated oil, which have unhealthy trans fats.   If youre thirsty, drink water. Coffee and tea are good in moderation, but skip sugary drinks and limit milk and dairy products to one or two daily servings.   The type of carbohydrate in the diet is more important than the amount. Some sources of carbohydrates, such as vegetables, fruits, whole grains, and beans-are healthier than others.   Finally, stay active  Signed, Thomasene Ripple, DO  04/29/2023 10:32 AM     [facility]

## 2023-04-30 DIAGNOSIS — N184 Chronic kidney disease, stage 4 (severe): Secondary | ICD-10-CM | POA: Diagnosis not present

## 2023-04-30 DIAGNOSIS — E1122 Type 2 diabetes mellitus with diabetic chronic kidney disease: Secondary | ICD-10-CM | POA: Diagnosis not present

## 2023-04-30 DIAGNOSIS — I69392 Facial weakness following cerebral infarction: Secondary | ICD-10-CM | POA: Diagnosis not present

## 2023-04-30 DIAGNOSIS — J449 Chronic obstructive pulmonary disease, unspecified: Secondary | ICD-10-CM | POA: Diagnosis not present

## 2023-04-30 DIAGNOSIS — I129 Hypertensive chronic kidney disease with stage 1 through stage 4 chronic kidney disease, or unspecified chronic kidney disease: Secondary | ICD-10-CM | POA: Diagnosis not present

## 2023-04-30 DIAGNOSIS — I69354 Hemiplegia and hemiparesis following cerebral infarction affecting left non-dominant side: Secondary | ICD-10-CM | POA: Diagnosis not present

## 2023-04-30 DIAGNOSIS — F32A Depression, unspecified: Secondary | ICD-10-CM | POA: Diagnosis not present

## 2023-04-30 DIAGNOSIS — M19011 Primary osteoarthritis, right shoulder: Secondary | ICD-10-CM | POA: Diagnosis not present

## 2023-05-07 DIAGNOSIS — I69354 Hemiplegia and hemiparesis following cerebral infarction affecting left non-dominant side: Secondary | ICD-10-CM | POA: Diagnosis not present

## 2023-05-07 DIAGNOSIS — N184 Chronic kidney disease, stage 4 (severe): Secondary | ICD-10-CM | POA: Diagnosis not present

## 2023-05-07 DIAGNOSIS — M19011 Primary osteoarthritis, right shoulder: Secondary | ICD-10-CM | POA: Diagnosis not present

## 2023-05-07 DIAGNOSIS — I69392 Facial weakness following cerebral infarction: Secondary | ICD-10-CM | POA: Diagnosis not present

## 2023-05-07 DIAGNOSIS — F32A Depression, unspecified: Secondary | ICD-10-CM | POA: Diagnosis not present

## 2023-05-07 DIAGNOSIS — J449 Chronic obstructive pulmonary disease, unspecified: Secondary | ICD-10-CM | POA: Diagnosis not present

## 2023-05-07 DIAGNOSIS — E1122 Type 2 diabetes mellitus with diabetic chronic kidney disease: Secondary | ICD-10-CM | POA: Diagnosis not present

## 2023-05-07 DIAGNOSIS — I129 Hypertensive chronic kidney disease with stage 1 through stage 4 chronic kidney disease, or unspecified chronic kidney disease: Secondary | ICD-10-CM | POA: Diagnosis not present

## 2023-05-07 NOTE — Telephone Encounter (Signed)
 Patient had OV 3/27 Per 3/27 OV note Metoprolol was discontinued    No further nursing outreach needed at this time

## 2023-05-08 DIAGNOSIS — I639 Cerebral infarction, unspecified: Secondary | ICD-10-CM

## 2023-05-10 ENCOUNTER — Encounter: Payer: Self-pay | Admitting: Nurse Practitioner

## 2023-05-28 ENCOUNTER — Inpatient Hospital Stay: Payer: Medicare Other | Admitting: Neurology

## 2023-06-11 ENCOUNTER — Encounter (HOSPITAL_COMMUNITY): Payer: Self-pay

## 2023-06-14 ENCOUNTER — Encounter: Payer: Self-pay | Admitting: Neurology

## 2023-06-14 ENCOUNTER — Ambulatory Visit: Payer: Medicare Other | Admitting: Neurology

## 2023-06-14 VITALS — BP 138/73 | HR 70 | Ht 59.5 in | Wt 178.5 lb

## 2023-06-14 DIAGNOSIS — I6381 Other cerebral infarction due to occlusion or stenosis of small artery: Secondary | ICD-10-CM | POA: Diagnosis not present

## 2023-06-14 NOTE — Progress Notes (Signed)
 [facility]  PATIENT: Lynn Ray DOB: [birth year]  REQUESTING CLINICIAN: Zorita Hiss Hamp Levine, PA-C HISTORY FROM: Patient/Daughter and chart review  REASON FOR VISIT: CVA Follow up    HISTORICAL  CHIEF COMPLAINT:  Chief Complaint  Patient presents with   New Patient (Initial Visit)    RM 13. Hospital f/u CVA. Was at [facility] 02/28/23-03/09/23. Completed home PT.OT.  Ambulates with cane. Marvell Slider once since being discharged.  Could not tolerate CPAP in past.     HISTORY OF PRESENT ILLNESS:  This is a 89 year old woman with medical conditions including CKD, hypertension, prediabetes, hypothyroidism, hypertension, hyperlipidemia who is presenting who daughter after being admitted to the hospital for left frontal stroke. She initially presented with work finding difficulty and found to have the left frontal stroke. She has completed DAPT, and currently on Aspirin  alone. She has completed physical and occupational therapies and continues to do the exercise at home. She is still complaining of weak hand grip on the right.  The main complaint today is memory complaint but mainly word finding difficulty. She reports difficulty getting the words out .   Hospital Course and Summary  89 y.o. female with history of hypertension, postoperative hypothyroidism, chronic kidney disease stage IV, chronic anemia, MGUS, prediabetes, rectal cancer was brought to the ER after patient was noticed to have difficulty expressing words.  As per patient's daughter who provided most of the history patient's son initially called yesterday morning at around 10 AM when patient talked normally.  At around 1 PM patient's son again called on the patient through the phone when patient's son felt that patient had some difficulty talking.  Later last evening when patient's daughter came to check on the patient patient was finding it difficult to express words.  Patient was brought to the ER for stroke workup.  There  was also some concern for left facial droop.  The workup confirmed acute ischemic stroke in the right frontal and left thalamic area and she was admitted.  Acute CVA -    appreciate neurology consult.  Stroke workup done, currently on DAPT for 3 weeks thereafter aspirin  alone, LDL above goal hence added statin, PT OT and speech eval, may require CIR/rehab.  Stable echo and carotid duplex.   OTHER MEDICAL CONDITIONS: Hypertension, hyperlipidemia, CKD, Sleep apnea    REVIEW OF SYSTEMS: Full 14 system review of systems performed and negative with exception of: As noted in the HPI   ALLERGIES: Allergies  Allergen Reactions   Aspirin  Other (See Comments)    Severe abdominal pain   Tape Rash    adhesive    HOME MEDICATIONS: Outpatient Medications Prior to Visit  Medication Sig Dispense Refill   acetaminophen  (TYLENOL ) 325 MG tablet Take 1-2 tablets (325-650 mg total) by mouth every 4 (four) hours as needed for mild pain (pain score 1-3).     aspirin  EC 81 MG tablet Take 1 tablet (81 mg total) by mouth daily.     atorvastatin  (LIPITOR) 40 MG tablet Take 1 tablet (40 mg total) by mouth daily. 90 tablet 3   Carboxymethylcellulose Sodium ([employer] LUBRICANT EYE DROPS OP) Apply 2 drops to eye 2 (two) times daily as needed.     cholecalciferol (VITAMIN D3) 25 MCG (1000 UT) tablet Take 1,000 Units by mouth daily.     levothyroxine  (SYNTHROID ) 100 MCG tablet Take 1 tablet (100 mcg total) by mouth daily. Follow-up due in June with labs 90 tablet 0   vitamin B-12 (CYANOCOBALAMIN )  100 MCG tablet Take 100 mcg by mouth daily.     metoprolol  succinate (TOPROL -XL) 50 MG 24 hr tablet Take 1 tablet (50 mg total) by mouth daily. Take with or immediately following a meal. 90 tablet 3   No facility-administered medications prior to visit.    PAST MEDICAL HISTORY: Past Medical History:  Diagnosis Date   Arthritis    Cancer (HCC) 2004   rectal cancer   Cataract    right eye   Chiari malformation    CKD  (chronic kidney disease), stage IV (HCC)    Depression    Diabetes mellitus (HCC)    "pre"   Hypertension    Sleep apnea    no CPAP   Thyroid  disease    hyporthyroid    PAST SURGICAL HISTORY: Past Surgical History:  Procedure Laterality Date   CATARACT EXTRACTION Bilateral    chiari malformation  2013   surgery   COLONOSCOPY     FLEXIBLE SIGMOIDOSCOPY     POLYPECTOMY     FAMILY HISTORY: Family History  Problem Relation Age of Onset   Alzheimer's disease Mother    Stroke Mother        mini   Heart attack Mother    Hypertension Mother    Depression Other    Anemia Other    Obesity Other    Colon cancer Neg Hx    Esophageal cancer Neg Hx    Stomach cancer Neg Hx     SOCIAL HISTORY: Social History   Socioeconomic History   Marital status: Widowed    Spouse name: Donavon Fudge   Number of children: 2   Years of education: Masters   Highest education level: Not on file  Occupational History   Occupation: retired    Comment: retired  Tobacco Use   Smoking status: Former    Current packs/day: 0.25    Types: Cigarettes   Smokeless tobacco: Never   Tobacco comments:    04/2013,quit  Vaping Use   Vaping status: Never Used  Substance and Sexual Activity   Alcohol use: No    Alcohol/week: 0.0 standard drinks of alcohol   Drug use: No   Sexual activity: Not Currently  Other Topics Concern   Not on file  Social History Narrative   Patient consumes one cup of coffee daily   Right handed   Caffeine use: coffee daily, tea sometimes.    Lives alone   Social Drivers of Health   Financial Resource Strain: Low Risk  (10/22/2020)   Overall Financial Resource Strain (CARDIA)    Difficulty of Paying Living Expenses: Not hard at all  Food Insecurity: No Food Insecurity (02/23/2023)   Hunger Vital Sign    Worried About Running Out of Food in the Last Year: Never true    Ran Out of Food in the Last Year: Never true  Transportation Needs: No Transportation Needs (02/23/2023)    PRAPARE - Administrator, Civil Service (Medical): No    Lack of Transportation (Non-Medical): No  Physical Activity: Inactive (10/22/2020)   Exercise Vital Sign    Days of Exercise per Week: 0 days    Minutes of Exercise per Session: 0 min  Stress: No Stress Concern Present (10/22/2020)   [employer] of Occupational Health - Occupational Stress Questionnaire    Feeling of Stress : Not at all  Social Connections: Socially Integrated (02/23/2023)   Social Connection and Isolation Panel [NHANES]    Frequency of Communication with Friends and  Family: More than three times a week    Frequency of Social Gatherings with Friends and Family: Once a week    Attends Religious Services: More than 4 times per year    Active Member of Clubs or Organizations: No    Attends Engineer, structural: More than 4 times per year    Marital Status: Married  Catering manager Violence: Not At Risk (02/23/2023)   Humiliation, Afraid, Rape, and Kick questionnaire    Fear of Current or Ex-Partner: No    Emotionally Abused: No    Physically Abused: No    Sexually Abused: No    PHYSICAL EXAM  GENERAL EXAM/CONSTITUTIONAL: Vitals:  Vitals:   06/14/23 1441 06/14/23 1452  BP: (!) 143/77 138/73  Pulse: 73 70  Weight: 178 lb 8 oz (81 kg)   Height: 4' 11.5" (1.511 m)    Body mass index is 35.45 kg/m. Wt Readings from Last 3 Encounters:  06/14/23 178 lb 8 oz (81 kg)  04/27/23 179 lb 6.4 oz (81.4 kg)  04/26/23 180 lb 9.6 oz (81.9 kg)   Patient is in no distress; well developed, nourished and groomed; neck is supple  MUSCULOSKELETAL: Gait, strength, tone, movements noted in Neurologic exam below  NEUROLOGIC: MENTAL STATUS:     06/18/2017    1:56 PM  MMSE - Mini Mental State Exam  Orientation to time 5  Orientation to Place 5  Registration 3  Attention/ Calculation 4  Recall 3  Language- name 2 objects 2  Language- repeat 1  Language- follow 3 step command 3  Language- read  & follow direction 1  Write a sentence 1  Copy design 1  Total score 29   awake, alert, oriented to person, place and time recent and remote memory intact normal attention and concentration language fluent, comprehension intact, naming intact fund of knowledge appropriate  CRANIAL NERVE:  2nd, 3rd, 4th, 6th - Visual fields full to confrontation, extraocular muscles intact, no nystagmus 5th - facial sensation symmetric 7th - facial strength symmetric 8th - hearing intact 9th - palate elevates symmetrically, uvula midline 11th - shoulder shrug symmetric 12th - tongue protrusion midline  MOTOR:  normal bulk and tone, decrease right hand grip otherwise normal  SENSORY:  normal and symmetric to light touch  COORDINATION:  finger-nose-finger, fine finger movements normal   DIAGNOSTIC DATA (LABS, IMAGING, TESTING) - I reviewed patient records, labs, notes, testing and imaging myself where available.  Lab Results  Component Value Date   WBC 5.8 03/05/2023   HGB 10.0 (L) 03/05/2023   HCT 31.4 (L) 03/05/2023   MCV 88.0 03/05/2023   PLT 252 03/05/2023      Component Value Date/Time   NA 139 03/08/2023 0530   NA 138 02/14/2022 0000   K 4.7 03/08/2023 0530   CL 115 (H) 03/08/2023 0530   CO2 18 (L) 03/08/2023 0530   GLUCOSE 91 03/08/2023 0530   BUN 34 (H) 03/08/2023 0530   BUN 21 02/14/2022 0000   CREATININE 2.26 (H) 03/08/2023 0530   CREATININE 2.25 (H) 12/04/2022 1421   CALCIUM  9.5 03/08/2023 0530   PROT 7.0 03/01/2023 0546   PROT 7.2 01/20/2015 1112   ALBUMIN 3.2 (L) 03/01/2023 0546   ALBUMIN 4.0 01/20/2015 1112   AST 15 03/01/2023 0546   AST 15 12/04/2022 1421   ALT 11 03/01/2023 0546   ALT 8 12/04/2022 1421   ALKPHOS 89 03/01/2023 0546   BILITOT 0.4 03/01/2023 0546   BILITOT 0.2 12/04/2022 1421  GFRNONAA 21 (L) 03/08/2023 0530   GFRNONAA 21 (L) 12/04/2022 1421   GFRAA 40 (L) 01/20/2015 1112   Lab Results  Component Value Date   CHOL 156 02/23/2023   HDL  49 02/23/2023   LDLCALC 84 02/23/2023   LDLDIRECT 78.0 07/21/2022   TRIG 115 02/23/2023   CHOLHDL 3.2 02/23/2023   Lab Results  Component Value Date   HGBA1C 5.8 (H) 02/23/2023   Lab Results  Component Value Date   VITAMINB12 607 05/18/2021   Lab Results  Component Value Date   TSH 0.18 (L) 07/21/2022    MRI Brain 02/23/2023 1. Patchy acute ischemic infarct involving the parasagittal left frontal lobe. Minimal associated petechial blood products without hemorrhagic transformation or significant mass effect. 2. Additional subcentimeter acute to early subacute ischemic nonhemorrhagic infarcts involving the subcortical right frontal lobe and superior left thalamus. 3. Underlying age-related cerebral atrophy with chronic microvascular ischemic disease, with a few scattered remote lacunar infarcts about the hemispheric cerebral white matter and cerebellu  MRA Head 02/23/2023 1. No proximal intracranial large vessel occlusion or high-grade proximal arterial stenosis identified. 2. 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 2 mm vascular protrusion arising from the right internal carotid artery at the petrous-cavernous junction, which may reflect an aneurysm or infundibulum. 2 mm vascular protrusion arising from the cavernous right internal carotid artery more distally, which may reflect an aneurysm or infundibulum. Neurointerventional consultation recommended.   ASSESSMENT AND PLAN  89 y.o. year old female with CKD, hypertension, prediabetes, hypothyroidism, hypertension, hyperlipidemia who is presenting who daughter after being admitted to the hospital for left frontal stroke. Stroke etiology likely small vessel disease. She has already completed PT/OT/Speech but still complaints of decrease hand grip, however she continues to do the exercises at home. Advised patient to continue current medications, exercise plans and to return as needed. She voiced understanding.    1.  Cerebrovascular accident (CVA) due to stenosis of small artery [facility])      Patient Instructions  Continue current medications  Continue with physical and occupation therapies at home Continue to follow up with PCP and return as needed   No orders of the defined types were placed in this encounter.   No orders of the defined types were placed in this encounter.   Return if symptoms worsen or fail to improve.    Cassandra Cleveland, MD 06/16/2023, 11:16 AM  [facility] [address], [address] [place], [place] [address] [phone]

## 2023-06-15 ENCOUNTER — Telehealth: Payer: Self-pay | Admitting: Cardiology

## 2023-06-15 MED ORDER — AMLODIPINE BESYLATE 2.5 MG PO TABS: 2.5000 mg | ORAL_TABLET | Freq: Every day | ORAL | 3 refills | Status: DC

## 2023-06-15 NOTE — Telephone Encounter (Signed)
 Spoke with pt's daughter over the phone and explained that the monitor results automatically get uploaded to the pt's chart but it doesn't appear that Dr. Emmette Harms has resulted the monitor result yet. Explained to pt that I will forward this to Dr. Ryan Coyer nurse to keep an eye out for the results. Pt's daughter also stated that the pt's pharmacy never received the Amlodipine rx that was started at the OV on 3/27 and had been taking Metoprolol  since then because she didn't want to not take anything for her HTN. Metoprolol  has now been discontinued from pt's med list and Amlodipine 2.5 mg has been ordered and sent to pt preferred pharmacy. No further questions at this time.

## 2023-06-15 NOTE — Telephone Encounter (Signed)
 New Message:     Daughter called and said patient had worn  a Monitor>  She said they did not know the results were in My Chart, until patient saw her Neurologist. Daughter would like for Dr Emmette Harms to please call and explain the results and also what does the patient need to do  and if there are any changes in her medicine please.

## 2023-06-16 ENCOUNTER — Encounter: Payer: Self-pay | Admitting: Neurology

## 2023-06-16 NOTE — Patient Instructions (Signed)
 Continue current medications  Continue with physical and occupation therapies at home Continue to follow up with PCP and return as needed

## 2023-06-18 ENCOUNTER — Encounter

## 2023-06-18 NOTE — Patient Instructions (Signed)
 Lynn Ray , Thank you for taking time out of your busy schedule to complete your Annual Wellness Visit with me. I enjoyed our conversation and look forward to speaking with you again next year. I, as well as your care team,  appreciate your ongoing commitment to your health goals. Please review the following plan we discussed and let me know if I can assist you in the future. Your Game plan/ To Do List    Follow up Visits: Next Medicare AWV with our clinical staff: Aaron Aas   Have you seen your provider in the last 6 months (3 months if uncontrolled diabetes)? Yes Next Office Visit with your provider: 08/09/23  Clinician Recommendations:  Aim for 30 minutes of exercise or brisk walking, 6-8 glasses of water, and 5 servings of fruits and vegetables each day.       This is a list of the screening recommended for you and due dates:  Health Maintenance  Topic Date Due   DEXA scan (bone density measurement)  Never done   COVID-19 Vaccine (3 - 2024-25 season) 10/01/2022   Colon Cancer Screening  05/04/2023   Medicare Annual Wellness Visit  07/21/2023   Flu Shot  08/31/2023   DTaP/Tdap/Td vaccine (2 - Td or Tdap) 07/12/2027   Pneumonia Vaccine  Completed   Zoster (Shingles) Vaccine  Completed   HPV Vaccine  Aged Out   Meningitis B Vaccine  Aged Out    Advanced directives:  Advance Care Planning is important because it:  [x]  Makes sure you receive the medical care that is consistent with your values, goals, and preferences  [x]  It provides guidance to your family and loved ones and reduces their decisional burden about whether or not they are making the right decisions based on your wishes.  Follow the link provided in your after visit summary or read over the paperwork we have mailed to you to help you started getting your Advance Directives in place. If you need assistance in completing these, please reach out to us  so that we can help you!  See attachments for Preventive Care and Fall  Prevention Tips.

## 2023-06-18 NOTE — Progress Notes (Signed)
 This encounter was created in error - please disregard. Called x 3 with no answer/ LDM to call office to reschedule

## 2023-07-03 ENCOUNTER — Emergency Department (HOSPITAL_BASED_OUTPATIENT_CLINIC_OR_DEPARTMENT_OTHER): Admitting: Radiology

## 2023-07-03 ENCOUNTER — Emergency Department (HOSPITAL_BASED_OUTPATIENT_CLINIC_OR_DEPARTMENT_OTHER)
Admission: EM | Admit: 2023-07-03 | Discharge: 2023-07-03 | Disposition: A | Discharge status: Home / Self Care | Attending: Emergency Medicine | Admitting: Emergency Medicine

## 2023-07-03 ENCOUNTER — Other Ambulatory Visit: Payer: Self-pay

## 2023-07-03 ENCOUNTER — Encounter (HOSPITAL_BASED_OUTPATIENT_CLINIC_OR_DEPARTMENT_OTHER): Payer: Self-pay

## 2023-07-03 DIAGNOSIS — R0981 Nasal congestion: Secondary | ICD-10-CM | POA: Diagnosis present

## 2023-07-03 DIAGNOSIS — R059 Cough, unspecified: Secondary | ICD-10-CM | POA: Diagnosis not present

## 2023-07-03 DIAGNOSIS — J069 Acute upper respiratory infection, unspecified: Secondary | ICD-10-CM | POA: Insufficient documentation

## 2023-07-03 DIAGNOSIS — R0602 Shortness of breath: Secondary | ICD-10-CM | POA: Diagnosis not present

## 2023-07-03 DIAGNOSIS — Z7982 Long term (current) use of aspirin: Secondary | ICD-10-CM | POA: Diagnosis not present

## 2023-07-03 LAB — CBC
HCT: 30.1 % — ABNORMAL LOW (ref 36.0–46.0)
Hemoglobin: 9.3 g/dL — ABNORMAL LOW (ref 12.0–15.0)
MCH: 27.8 pg (ref 26.0–34.0)
MCHC: 30.9 g/dL (ref 30.0–36.0)
MCV: 90.1 fL (ref 80.0–100.0)
Platelets: 244 10*3/uL (ref 150–400)
RBC: 3.34 MIL/uL — ABNORMAL LOW (ref 3.87–5.11)
RDW: 13.7 % (ref 11.5–15.5)
WBC: 4.6 10*3/uL (ref 4.0–10.5)
nRBC: 0 % (ref 0.0–0.2)

## 2023-07-03 LAB — BASIC METABOLIC PANEL WITH GFR
Anion gap: 12 (ref 5–15)
BUN: 17 mg/dL (ref 8–23)
CO2: 22 mmol/L (ref 22–32)
Calcium: 9.9 mg/dL (ref 8.9–10.3)
Chloride: 105 mmol/L (ref 98–111)
Creatinine, Ser: 2.13 mg/dL — ABNORMAL HIGH (ref 0.44–1.00)
GFR, Estimated: 22 mL/min — ABNORMAL LOW (ref >60)
Glucose, Bld: 104 mg/dL — ABNORMAL HIGH (ref 70–99)
Potassium: 4.2 mmol/L (ref 3.5–5.1)
Sodium: 139 mmol/L (ref 135–145)

## 2023-07-03 LAB — RESP PANEL BY RT-PCR (RSV, FLU A&B, COVID)  RVPGX2
Influenza A by PCR: NEGATIVE
Influenza B by PCR: NEGATIVE
Resp Syncytial Virus by PCR: NEGATIVE
SARS Coronavirus 2 by RT PCR: NEGATIVE

## 2023-07-03 MED ORDER — BENZONATATE 100 MG PO CAPS: 100.0000 mg | ORAL_CAPSULE | Freq: Three times a day (TID) | ORAL | 0 refills | Status: AC

## 2023-07-03 NOTE — ED Triage Notes (Signed)
 Pt presents with generalized weakness, decreased appetite, cough, ShOB x 2 days. Pt was at a large gathering on Saturday and became sick the next day.

## 2023-07-03 NOTE — ED Provider Notes (Signed)
 [place] EMERGENCY DEPARTMENT AT [facility] Provider Note   CSN: [id number] Arrival date & time: 07/03/23  2004     History  Chief Complaint  Patient presents with   Weakness    Lynn Ray is a 89 y.o. female.   Weakness    Pt started to cough and congestion, malaise.  SX started this past Saturday.  She has been having chills.  SOme decreased appetite.  She is concerned she coulg have covid.  No fever.  No vomit or diarrrhea.  Home Medications Prior to Admission medications   Medication Sig Start Date End Date Taking? Authorizing Provider  benzonatate (TESSALON) 100 MG capsule Take 1 capsule (100 mg total) by mouth every 8 (eight) hours. 07/03/23  Yes Trish Furl, MD  acetaminophen  (TYLENOL ) 325 MG tablet Take 1-2 tablets (325-650 mg total) by mouth every 4 (four) hours as needed for mild pain (pain score 1-3). 03/08/23   Setzer, Sandra J, PA-C  amLODipine  (NORVASC ) 2.5 MG tablet Take 1 tablet (2.5 mg total) by mouth daily. 06/15/23   Tobb, Kardie, DO  aspirin  EC 81 MG tablet Take 1 tablet (81 mg total) by mouth daily. 03/08/23   Setzer, Hamp Levine, PA-C  atorvastatin  (LIPITOR) 40 MG tablet Take 1 tablet (40 mg total) by mouth daily. 03/16/23   Adelia Homestead, MD  Carboxymethylcellulose Sodium ([employer] LUBRICANT EYE DROPS OP) Apply 2 drops to eye 2 (two) times daily as needed.    [provider]  cholecalciferol (VITAMIN D3) 25 MCG (1000 UT) tablet Take 1,000 Units by mouth daily.    [provider]  levothyroxine  (SYNTHROID ) 100 MCG tablet Take 1 tablet (100 mcg total) by mouth daily. Follow-up due in June with labs 04/24/23   Adelia Homestead, MD  vitamin B-12 (CYANOCOBALAMIN ) 100 MCG tablet Take 100 mcg by mouth daily.    [provider]      Allergies    Aspirin  and Tape    Review of Systems   Review of Systems  Neurological:  Positive for weakness.    Physical Exam Updated Vital Signs BP 108/83 (BP Location: Right Arm)   Pulse  87   Temp 97.8 F (36.6 C)   Resp 20   Ht 1.511 m (4' 11.5")   Wt 79.8 kg   SpO2 95%   BMI 34.95 kg/m  Physical Exam Vitals and nursing note reviewed.  Constitutional:      General: She is not in acute distress.    Appearance: She is well-developed.  HENT:     Head: Normocephalic and atraumatic.     Right Ear: External ear normal.     Left Ear: External ear normal.  Eyes:     General: No scleral icterus.       Right eye: No discharge.        Left eye: No discharge.     Conjunctiva/sclera: Conjunctivae normal.  Neck:     Trachea: No tracheal deviation.  Cardiovascular:     Rate and Rhythm: Normal rate and regular rhythm.  Pulmonary:     Effort: Pulmonary effort is normal. No respiratory distress.     Breath sounds: Normal breath sounds. No stridor. No wheezing or rales.  Abdominal:     General: Bowel sounds are normal. There is no distension.     Palpations: Abdomen is soft.     Tenderness: There is no abdominal tenderness. There is no guarding or rebound.  Musculoskeletal:  General: No tenderness or deformity.     Cervical back: Neck supple.  Skin:    General: Skin is warm and dry.     Findings: No rash.  Neurological:     General: No focal deficit present.     Mental Status: She is alert.     Cranial Nerves: No cranial nerve deficit, dysarthria or facial asymmetry.     Sensory: No sensory deficit.     Motor: No abnormal muscle tone or seizure activity.     Coordination: Coordination normal.  Psychiatric:        Mood and Affect: Mood normal.     ED Results / Procedures / Treatments   Labs (all labs ordered are listed, but only abnormal results are displayed) Labs Reviewed  BASIC METABOLIC PANEL WITH GFR - Abnormal; Notable for the following components:      Result Value   Glucose, Bld 104 (*)    Creatinine, Ser 2.13 (*)    GFR, Estimated 22 (*)    All other components within normal limits  CBC - Abnormal; Notable for the following components:   RBC  3.34 (*)    Hemoglobin 9.3 (*)    HCT 30.1 (*)    All other components within normal limits  RESP PANEL BY RT-PCR (RSV, FLU A&B, COVID)  [id number]    EKG None  Radiology DG Chest 2 View Result Date: 07/03/2023 CLINICAL DATA:  Shortness of breath with cough EXAM: CHEST - 2 VIEW COMPARISON:  10/17/2022 FINDINGS: The heart size and mediastinal contours are within normal limits. Both lungs are clear. There is severe degenerative changes of the right shoulder. IMPRESSION: No active cardiopulmonary disease. Electronically Signed   By: Tyron Gallon M.D.   On: 07/03/2023 20:59    Procedures Procedures    Medications Ordered in ED Medications - No data to display  ED Course/ Medical Decision Making/ A&P Clinical Course as of 07/03/23 2220  Tue Jul 03, 2023  2110 CBC(!) Metabolic panel shows anemia slightly decreased compared to previous [JK]  2110 DG Chest 2 View Chest x-ray without acute findings [JK]  2210 Resp panel by RT-PCR (RSV, Flu A&B, Covid) Anterior Nasal Swab COVID flu RSV negative. [JK]  2210 Basic metabolic panel(!) Bolick panel shows elevated creatinine, unchanged compared to previous values [JK]    Clinical Course User Index [JK] Trish Furl, MD                                 Medical Decision Making Problems Addressed: Upper respiratory tract infection, unspecified type: acute illness or injury that poses a threat to life or bodily functions  Amount and/or Complexity of Data Reviewed Labs: ordered. Decision-making details documented in ED Course. Radiology: ordered and independent interpretation performed. Decision-making details documented in ED Course.  Risk Prescription drug management.   Patient presented with upper respiratory type infections.  She also has noted some decreased appetite and cough.  ED workup reassuring.  No evidence of pneumonia on x-ray.  Patient does not have an oxygen requirement.  She is breathing without difficulty.  Laboratory test do  not show signs of COVID flu or RSV.  No signs of severe anemia or dehydration.  Possibly viral URI.  Will discharge home with prescription for Tessalon.  Evaluation and diagnostic testing in the emergency department does not suggest an emergent condition requiring admission or immediate intervention beyond what has been performed at this time.  The patient is safe for discharge and has been instructed to return immediately for worsening symptoms, change in symptoms or any other concerns.       Final Clinical Impression(s) / ED Diagnoses Final diagnoses:  Upper respiratory tract infection, unspecified type    Rx / DC Orders ED Discharge Orders          Ordered    benzonatate (TESSALON) 100 MG capsule  Every 8 hours        07/03/23 2219              Trish Furl, MD 07/03/23 2221

## 2023-07-03 NOTE — Discharge Instructions (Signed)
 Take the medications to help with your cough.  Follow-up with your doctor to be rechecked next week if your symptoms have not resolved.  Return to the ED for shortness of breath fevers or other concerning symptoms.

## 2023-07-06 ENCOUNTER — Encounter: Payer: Self-pay | Admitting: Internal Medicine

## 2023-07-06 ENCOUNTER — Ambulatory Visit: Admitting: Internal Medicine

## 2023-07-06 VITALS — BP 140/80 | HR 88 | Temp 98.2°F | Ht 59.5 in | Wt 180.0 lb

## 2023-07-06 DIAGNOSIS — Z8673 Personal history of transient ischemic attack (TIA), and cerebral infarction without residual deficits: Secondary | ICD-10-CM | POA: Diagnosis not present

## 2023-07-06 DIAGNOSIS — N184 Chronic kidney disease, stage 4 (severe): Secondary | ICD-10-CM

## 2023-07-06 DIAGNOSIS — I69920 Aphasia following unspecified cerebrovascular disease: Secondary | ICD-10-CM

## 2023-07-06 DIAGNOSIS — H9193 Unspecified hearing loss, bilateral: Secondary | ICD-10-CM

## 2023-07-06 DIAGNOSIS — I455 Other specified heart block: Secondary | ICD-10-CM | POA: Insufficient documentation

## 2023-07-06 DIAGNOSIS — J4489 Other specified chronic obstructive pulmonary disease: Secondary | ICD-10-CM

## 2023-07-06 DIAGNOSIS — I10 Essential (primary) hypertension: Secondary | ICD-10-CM | POA: Diagnosis not present

## 2023-07-06 NOTE — Assessment & Plan Note (Signed)
 Lungs clear today and recent normal CXR reassurance given that symptoms could linger a few weeks. If worsening or lack of improvement let us  know.

## 2023-07-06 NOTE — Assessment & Plan Note (Signed)
 This has improved some since last visit and is not back to pre-stroke baseline yet. She is working on this.

## 2023-07-06 NOTE — Assessment & Plan Note (Signed)
 Labs stable at recent ER visit.

## 2023-07-06 NOTE — Patient Instructions (Signed)
 I will check in with Dr. Emmette Harms about the heart monitor and visit.

## 2023-07-06 NOTE — Progress Notes (Signed)
   Subjective:   Patient ID: Lynn Ray, female    DOB: 12/06/1934, 89 y.o.   MRN: [id number]  HPI The patient is an 89 YO female coming in for concerns about heart monitor. She is unsure about follow up and if cardiology will be seeing her again. She was changed from metoprolol  and started amlodipine  about 1 month ago. She is still having fatigue. Recent ER visit for cold symptoms with benign workup given tessalon perles. She is not worsening but stable to mild improvement.   Review of Systems  Constitutional:  Positive for activity change and fatigue.  HENT:  Positive for hearing loss.   Eyes: Negative.   Respiratory:  Positive for cough. Negative for chest tightness and shortness of breath.   Cardiovascular:  Negative for chest pain, palpitations and leg swelling.  Gastrointestinal:  Negative for abdominal distention, abdominal pain, constipation, diarrhea, nausea and vomiting.  Musculoskeletal:  Positive for arthralgias.  Skin: Negative.   Neurological:  Positive for weakness.  Psychiatric/Behavioral: Negative.      Objective:  Physical Exam Constitutional:      Appearance: She is well-developed.  HENT:     Head: Normocephalic and atraumatic.     Comments: Hearing loss Cardiovascular:     Rate and Rhythm: Normal rate and regular rhythm.  Pulmonary:     Effort: Pulmonary effort is normal. No respiratory distress.     Breath sounds: Normal breath sounds. No wheezing or rales.  Abdominal:     General: Bowel sounds are normal. There is no distension.     Palpations: Abdomen is soft.     Tenderness: There is no abdominal tenderness. There is no rebound.  Musculoskeletal:     Cervical back: Normal range of motion.  Skin:    General: Skin is warm and dry.  Neurological:     Mental Status: She is alert and oriented to person, place, and time. Mental status is at baseline.     Coordination: Coordination normal.     Vitals:   07/06/23 1048 07/06/23 1054  BP: (!) 140/80 (!)  140/80  Pulse: 88   Temp: 98.2 F (36.8 C)   TempSrc: Oral   SpO2: 98%   Weight: 180 lb (81.6 kg)   Height: 4' 11.5" (1.511 m)     Assessment & Plan:  Visit time 20 minutes in face to face communication with patient and coordination of care, additional 15 minutes spent in record review, coordination or care, ordering tests, communicating/referring to other healthcare professionals, documenting in medical records all on the same day of the visit for total time 35 minutes spent on the visit.

## 2023-07-06 NOTE — Assessment & Plan Note (Signed)
 Multiple pauses on heart monitor longest 4.7 sec. She has stopped metoprolol  and on amlodipine . Still having some symptoms. I will message her cardiologist to see if repeat is needed. We discussed in detail her monitor results since she did not get follow up about this from cardiology.

## 2023-07-06 NOTE — Assessment & Plan Note (Signed)
 BP acceptable since she is still having some weakness and fatigue. Ideal BP 130/80 but today 140/80.

## 2023-07-06 NOTE — Assessment & Plan Note (Signed)
 She is still working on strength and cognitive recall and speed. Doing intentional reading to process and think critically about information.

## 2023-07-09 ENCOUNTER — Encounter

## 2023-07-09 DIAGNOSIS — T161XXA Foreign body in right ear, initial encounter: Secondary | ICD-10-CM | POA: Diagnosis not present

## 2023-07-09 NOTE — Progress Notes (Signed)
 This encounter was created in error - please disregard. Called [facility] with no answer.  I LDM for patient to call office and get rescheduled.

## 2023-07-18 ENCOUNTER — Ambulatory Visit

## 2023-08-01 ENCOUNTER — Ambulatory Visit

## 2023-08-01 VITALS — Ht 59.5 in | Wt 180.0 lb

## 2023-08-01 DIAGNOSIS — Z0000 Encounter for general adult medical examination without abnormal findings: Secondary | ICD-10-CM | POA: Diagnosis not present

## 2023-08-01 NOTE — Patient Instructions (Signed)
 [name] , Thank you for taking time out of your busy schedule to complete your Annual Wellness Visit with me. I enjoyed our conversation and look forward to speaking with you again next year. I, as well as your care team,  appreciate your ongoing commitment to your health goals. Please review the following plan we discussed and let me know if I can assist you in the future. Your Game plan/ To Do List   Follow up Visits: Next Medicare AWV with our clinical staff: 08/04/2024.   Have you seen your provider in the last 6 months (3 months if uncontrolled diabetes)? Yes Next Office Visit with your provider: 10/05/2023.  Clinician Recommendations:  Aim for 30 minutes of exercise or brisk walking, 6-8 glasses of water, and 5 servings of fruits and vegetables each day. Remember to discuss the tingling feeling in your rt hand, since having your stroke, with provider during your next office visit.  Also discuss getting more physical therapy to help regain more mobility.      This is a list of the screening recommended for you and due dates:  Health Maintenance  Topic Date Due   DEXA scan (bone density measurement)  Never done   COVID-19 Vaccine (3 - 2024-25 season) 10/01/2022   Colon Cancer Screening  05/04/2023   Flu Shot  08/31/2023   Medicare Annual Wellness Visit  07/31/2024   DTaP/Tdap/Td vaccine (2 - Td or Tdap) 07/12/2027   Pneumococcal Vaccine for age over 53  Completed   Zoster (Shingles) Vaccine  Completed   Hepatitis B Vaccine  Aged Out   HPV Vaccine  Aged Out   Meningitis B Vaccine  Aged Out    Advanced directives: (Copy Requested) Please bring a copy of your health care power of attorney and living will to the office to be added to your chart at your convenience. You can mail to [facility] [address] [place], [place] [address] or email to [email or web address] Advance Care Planning is important because it:  [x]  Makes sure you receive the medical care that is  consistent with your values, goals, and preferences  [x]  It provides guidance to your family and loved ones and reduces their decisional burden about whether or not they are making the right decisions based on your wishes.  Follow the link provided in your after visit summary or read over the paperwork we have mailed to you to help you started getting your Advance Directives in place. If you need assistance in completing these, please reach out to us  so that we can help you!  See attachments for Preventive Care and Fall Prevention Tips.

## 2023-08-01 NOTE — Progress Notes (Cosign Needed Addendum)
 Subjective:   Lynn Ray is a 89 y.o. who presents for a Medicare Wellness preventive visit.  As a reminder, Annual Wellness Visits don't include a physical exam, and some assessments may be limited, especially if this visit is performed virtually. We may recommend an in-person follow-up visit with your provider if needed.  Visit Complete: Virtual I connected with  Delray CHRISTELLA Bathe on 08/01/23 by a audio enabled telemedicine application and verified that I am speaking with the correct person using two identifiers.  Patient Location: Home  Provider Location: Home Office  I discussed the limitations of evaluation and management by telemedicine. The patient expressed understanding and agreed to proceed.  Vital Signs: Because this visit was a virtual/telehealth visit, some criteria may be missing or patient reported. Any vitals not documented were not able to be obtained and vitals that have been documented are patient reported.  VideoDeclined- This patient declined Librarian, academic. Therefore the visit was completed with audio only.  Persons Participating in Visit: Patient.  AWV Questionnaire: No: Patient Medicare AWV questionnaire was not completed prior to this visit.  Cardiac Risk Factors include: advanced age (>34men, >51 women);hypertension;Other (see comment), Risk factor comments: COPD, CKD stage 4     Objective:    Today's Vitals   08/01/23 1252 08/01/23 1301  Weight: 180 lb (81.6 kg)   Height: 4' 11.5 (1.511 m)   PainSc:  3    Body mass index is 35.75 kg/m.     08/01/2023    1:05 PM 07/03/2023    8:15 PM 02/28/2023    2:09 PM 02/22/2023    9:29 PM 10/22/2020    1:37 PM 07/04/2019    3:02 PM 07/01/2018    2:25 PM  Advanced Directives  Does Patient Have a Medical Advance Directive? Yes Yes Yes No Yes Yes Yes  Type of Estate agent of [place];Living will Healthcare Power of [place];Living will Healthcare Power of Attorney   Living will;Healthcare Power of Attorney Living will;Healthcare Power of [employer] Power of [place];Living will  Does patient want to make changes to medical advance directive?   No - Patient declined  No - Patient declined No - Patient declined   Copy of Healthcare Power of Attorney in Chart? No - copy requested  No - copy requested  No - copy requested No - copy requested No - copy requested   Would patient like information on creating a medical advance directive?   No - Patient declined No - Patient declined        Data saved with a previous flowsheet row definition    Current Medications (verified) Outpatient Encounter Medications as of 08/01/2023  Medication Sig   acetaminophen  (TYLENOL ) 325 MG tablet Take 1-2 tablets (325-650 mg total) by mouth every 4 (four) hours as needed for mild pain (pain score 1-3).   amLODipine  (NORVASC ) 2.5 MG tablet Take 1 tablet (2.5 mg total) by mouth daily.   aspirin  EC 81 MG tablet Take 1 tablet (81 mg total) by mouth daily.   atorvastatin  (LIPITOR) 40 MG tablet Take 1 tablet (40 mg total) by mouth daily.   benzonatate  (TESSALON ) 100 MG capsule Take 1 capsule (100 mg total) by mouth every 8 (eight) hours.   Carboxymethylcellulose Sodium ([employer] LUBRICANT EYE DROPS OP) Apply 2 drops to eye 2 (two) times daily as needed.   cholecalciferol (VITAMIN D3) 25 MCG (1000 UT) tablet Take 1,000 Units by mouth daily.   levothyroxine  (SYNTHROID ) 100 MCG  tablet Take 1 tablet (100 mcg total) by mouth daily. Follow-up due in June with labs   vitamin B-12 (CYANOCOBALAMIN ) 100 MCG tablet Take 100 mcg by mouth daily.   No facility-administered encounter medications on file as of 08/01/2023.    Allergies (verified) Aspirin  and Tape   History: Past Medical History:  Diagnosis Date   Arthritis    Cancer (HCC) 2004   rectal cancer   Cataract    right eye   Chiari malformation    CKD (chronic kidney disease), stage IV (HCC)    Depression    Diabetes mellitus  (HCC)    pre   Hypertension    Sleep apnea    no CPAP   Thyroid  disease    hyporthyroid   Past Surgical History:  Procedure Laterality Date   CATARACT EXTRACTION Bilateral    chiari malformation  2013   surgery   COLONOSCOPY     FLEXIBLE SIGMOIDOSCOPY     POLYPECTOMY     Family History  Problem Relation Age of Onset   Alzheimer's disease Mother    Stroke Mother        mini   Heart attack Mother    Hypertension Mother    Depression Other    Anemia Other    Obesity Other    Colon cancer Neg Hx    Esophageal cancer Neg Hx    Stomach cancer Neg Hx    Social History   Socioeconomic History   Marital status: Widowed    Spouse name: Deward   Number of children: 2   Years of education: Masters   Highest education level: Master's degree (e.g., MA, MS, MEng, MEd, MSW, MBA)  Occupational History   Occupation: retired    Comment: retired  Tobacco Use   Smoking status: Former    Current packs/day: 0.25    Types: Cigarettes   Smokeless tobacco: Never   Tobacco comments:    04/2013,quit  Vaping Use   Vaping status: Never Used  Substance and Sexual Activity   Alcohol use: No    Alcohol/week: 0.0 standard drinks of alcohol   Drug use: No   Sexual activity: Not Currently  Other Topics Concern   Not on file  Social History Narrative   Patient consumes one cup of coffee daily   Right handed   Caffeine use: coffee daily, tea sometimes.    Lives alone/at night she has a caregiver/2025   Social Drivers of Corporate investment banker Strain: Low Risk  (08/01/2023)   Overall Financial Resource Strain (CARDIA)    Difficulty of Paying Living Expenses: Not hard at all  Food Insecurity: No Food Insecurity (08/01/2023)   Hunger Vital Sign    Worried About Running Out of Food in the Last Year: Never true    Ran Out of Food in the Last Year: Never true  Transportation Needs: No Transportation Needs (08/01/2023)   PRAPARE - Administrator, Civil Service (Medical): No     Lack of Transportation (Non-Medical): No  Physical Activity: Inactive (08/01/2023)   Exercise Vital Sign    Days of Exercise per Week: 0 days    Minutes of Exercise per Session: 0 min  Stress: No Stress Concern Present (08/01/2023)   [employer] of Occupational Health - Occupational Stress Questionnaire    Feeling of Stress: Only a little  Social Connections: Moderately Integrated (08/01/2023)   Social Connection and Isolation Panel    Frequency of Communication with Friends and Family: More  than three times a week    Frequency of Social Gatherings with Friends and Family: Twice a week    Attends Religious Services: More than 4 times per year    Active Member of [employer] or Organizations: Yes    Attends Banker Meetings: Never    Marital Status: Widowed    Tobacco Counseling Counseling given: Not Answered Tobacco comments: 04/2013,quit    Clinical Intake:  Pre-visit preparation completed: Yes  Pain : 0-10 Pain Score: 3  Pain Type: Chronic pain Pain Location: Arm Pain Orientation: Right Pain Onset: More than a month ago Pain Frequency: Constant        Lab Results  Component Value Date   HGBA1C 5.8 (H) 02/23/2023   HGBA1C 6.0 07/21/2022   HGBA1C 6.0 11/25/2021     How often do you need to have someone help you when you read instructions, pamphlets, or other written materials from your doctor or pharmacy?: 1 - Never     Information entered by :: Rashonda Warrior, RMA   Activities of Daily Living     08/01/2023    1:02 PM 07/05/2023    4:07 PM  In your present state of health, do you have any difficulty performing the following activities:  Hearing? 1 1  Comment wears hearing aides   Vision? 0 0  Difficulty concentrating or making decisions? 0 0  Walking or climbing stairs? 0 0  Dressing or bathing? 0 0  Doing errands, shopping? 0 1  Comment Pt drives locally   Preparing Food and eating ? N N  Using the Toilet? N N  In the past six months, have  you accidently leaked urine? N Y  Do you have problems with loss of bowel control? N N  Managing your Medications? N N  Managing your Finances? N N  Housekeeping or managing your Housekeeping? LOISE GRADE    Patient Care Team: Rollene Almarie LABOR, MD as PCP - General (Internal Medicine) Octavia Charleston, MD as Consulting Physician (Ophthalmology)  I have updated your Care Teams any recent Medical Services you may have received from other providers in the past year.     Assessment:   This is a routine wellness examination for Desirey.  Hearing/Vision screen Hearing Screening - Comments:: wears hearing aides Vision Screening - Comments:: Wears eyeglasses/ [facility]   Goals Addressed               This Visit's Progress     Client understands the importance of follow-up with providers by attending scheduled visits (pt-stated)   On track     Would like to improve my mobility and maintain my mental status.       Depression Screen     08/01/2023    1:14 PM 04/27/2023    2:24 PM 03/30/2023    2:13 PM 07/21/2022    1:42 PM 11/25/2021    3:32 PM 08/16/2021    1:39 PM 10/22/2020    1:39 PM  PHQ 2/9 Scores  PHQ - 2 Score 0 0 0 0 0 0 0  PHQ- 9 Score 2  2 0  0     Fall Risk     08/01/2023    1:06 PM 07/06/2023   10:55 AM 07/05/2023    4:07 PM 04/27/2023    2:24 PM 03/30/2023    2:13 PM  Fall Risk   Falls in the past year? 1 0 1 0 0  Number falls in past yr: 0  0 0 0 0  Injury with Fall? 0 0 0 0   Risk for fall due to : Impaired balance/gait   No Fall Risks   Follow up Falls evaluation completed;Falls prevention discussed Falls evaluation completed       MEDICARE RISK AT HOME:  Medicare Risk at Home Any stairs in or around the home?: Yes (tri level) If so, are there any without handrails?: No Home free of loose throw rugs in walkways, pet beds, electrical cords, etc?: Yes Adequate lighting in your home to reduce risk of falls?: Yes Life alert?: Yes Use of a cane, walker or  w/c?: Yes (uses a cane) Grab bars in the bathroom?: Yes Shower chair or bench in shower?: Yes Elevated toilet seat or a handicapped toilet?: Yes  TIMED UP AND GO:  Was the test performed?  No  Cognitive Function: 6CIT completed    06/18/2017    1:56 PM  MMSE - Mini Mental State Exam  Orientation to time 5  Orientation to Place 5  Registration 3  Attention/ Calculation 4  Recall 3  Language- name 2 objects 2  Language- repeat 1  Language- follow 3 step command 3  Language- read & follow direction 1  Write a sentence 1  Copy design 1  Total score 29        08/01/2023    1:08 PM  6CIT Screen  What Year? 0 points  What month? 0 points  What time? 0 points  Count back from 20 0 points  Months in reverse 0 points  Repeat phrase 0 points  Total Score 0 points    Immunizations Immunization History  Administered Date(s) Administered   Fluad Quad(high Dose 65+) 11/20/2018, 12/22/2019, 10/27/2020, 11/25/2021   Fluad Trivalent(High Dose 65+) 03/01/2023   Influenza, High Dose Seasonal PF 12/26/2016, 10/08/2017, 11/20/2018   Influenza-Unspecified 10/30/2013   PFIZER(Purple Top)SARS-COV-2 Vaccination 02/21/2019, 03/14/2019   Pneumococcal Conjugate-13 03/06/2016   Pneumococcal Polysaccharide-23 06/18/2017   Tdap 07/11/2017   Zoster Recombinant(Shingrix) 01/01/2019, 04/29/2019   Zoster, Unspecified 01/01/2019, 04/29/2019    Screening Tests Health Maintenance  Topic Date Due   DEXA SCAN  Never done   COVID-19 Vaccine (3 - 2024-25 season) 10/01/2022   Colonoscopy  05/04/2023   INFLUENZA VACCINE  08/31/2023   Medicare Annual Wellness (AWV)  07/31/2024   DTaP/Tdap/Td (2 - Td or Tdap) 07/12/2027   Pneumococcal Vaccine: 50+ Years  Completed   Zoster Vaccines- Shingrix  Completed   Hepatitis B Vaccines  Aged Out   HPV VACCINES  Aged Out   Meningococcal B Vaccine  Aged Out    Health Maintenance  Health Maintenance Due  Topic Date Due   DEXA SCAN  Never done   COVID-19  Vaccine (3 - 2024-25 season) 10/01/2022   Colonoscopy  05/04/2023   Health Maintenance Items Addressed: See Nurse Notes at the end of this note  Additional Screening:  Vision Screening: Recommended annual ophthalmology exams for early detection of glaucoma and other disorders of the eye. Would you like a referral to an eye doctor? No    Dental Screening: Recommended annual dental exams for proper oral hygiene  Community Resource Referral / Chronic Care Management: CRR required this visit?  No   CCM required this visit?  No   Plan:    I have personally reviewed and noted the following in the patient's chart:   Medical and social history Use of alcohol, tobacco or illicit drugs  Current medications and supplements including opioid prescriptions. Patient  is not currently taking opioid prescriptions. Functional ability and status Nutritional status Physical activity Advanced directives List of other physicians Hospitalizations, surgeries, and ER visits in previous 12 months Vitals Screenings to include cognitive, depression, and falls Referrals and appointments  In addition, I have reviewed and discussed with patient certain preventive protocols, quality metrics, and best practice recommendations. A written personalized care plan for preventive services as well as general preventive health recommendations were provided to patient.   Paizley Ramella L Taelyr Jantz, CMA   08/01/2023   After Visit Summary: (MyChart) Due to this being a telephonic visit, the after visit summary with patients personalized plan was offered to patient via MyChart   Notes: Patient has complaints about her hands being iced cold most of the time.  She also stated that she has a tingling feeling in her rt hand constantly since her stroke and would like to discuss with provider during her next office visit.  Patient also would like to discuss starting up with PT to try and regain more mobility.  She had no other concerns  to address today.  Medical screening examination/treatment/procedure(s) were performed by non-physician practitioner and as supervising physician I was immediately available for consultation/collaboration.  I agree with above. Karlynn Noel, MD

## 2023-08-13 ENCOUNTER — Ambulatory Visit (INDEPENDENT_AMBULATORY_CARE_PROVIDER_SITE_OTHER): Admitting: Audiology

## 2023-08-13 ENCOUNTER — Ambulatory Visit (INDEPENDENT_AMBULATORY_CARE_PROVIDER_SITE_OTHER): Admitting: Physician Assistant

## 2023-08-13 ENCOUNTER — Encounter (INDEPENDENT_AMBULATORY_CARE_PROVIDER_SITE_OTHER): Payer: Self-pay | Admitting: Physician Assistant

## 2023-08-13 VITALS — BP 116/68 | HR 95

## 2023-08-13 DIAGNOSIS — H9041 Sensorineural hearing loss, unilateral, right ear, with unrestricted hearing on the contralateral side: Secondary | ICD-10-CM

## 2023-08-13 DIAGNOSIS — H903 Sensorineural hearing loss, bilateral: Secondary | ICD-10-CM

## 2023-08-13 DIAGNOSIS — H90A32 Mixed conductive and sensorineural hearing loss, unilateral, left ear with restricted hearing on the contralateral side: Secondary | ICD-10-CM

## 2023-08-13 DIAGNOSIS — H9072 Mixed conductive and sensorineural hearing loss, unilateral, left ear, with unrestricted hearing on the contralateral side: Secondary | ICD-10-CM

## 2023-08-13 NOTE — Progress Notes (Signed)
 Dear Dr. Rollene, Here is my assessment for our mutual patient, Lynn Ray. Thank you for allowing me the opportunity to care for your patient. Please do not hesitate to contact me should you have any other questions. Sincerely, Chyrl Cohen PA-C  Otolaryngology Clinic Note Referring provider: Dr. Rollene HPI:  Lynn Ray is a 89 y.o. female kindly referred by Dr. Rollene   The patient is an 89 year old female seen today for evaluation of right ear irritation.  She notes a longstanding history of bilateral hearing loss.  She notes some itchiness in the right ear predominantly, she notes she uses Q-tips.  She notes that she has worn hearing aids for the last 2 to 3 years and did not notice any significant improvement in her hearing.  She denies any significant changes in the meantime.  She denies any pain in the ear, no dizziness, no ringing.     Independent Review of Additional Tests or Records:  Audiological evaluation on 08/13/2023   Otoscopy: Right ear: Clear external ear canal and notable landmarks visualized on the tympanic membrane. Left ear:  Clear external ear canal and notable landmarks visualized on the tympanic membrane.   Tympanometry: Right ear: Type A- Normal external ear canal volume with normal middle ear pressure and tympanic membrane compliance. Left ear: Normal external ear canal volume with negative middle ear pressure and reduced tympanic membrane compliance.     Pure tone Audiometry: Right ear- Mild to moderately severe sensorineural hearing loss from 125 Hz - 8000 Hz. Left ear-  Mild to moderately severe mixed hearing loss from 125 Hz - 8000 Hz.   Speech Audiometry: Right ear- Speech Reception Threshold (SRT) was obtained at 50 dBHL. Left ear-Speech Reception Threshold (SRT) was obtained at 50 dBHL.   Word Recognition Score Tested using NU-6 (recorded) Right ear: 76% was obtained at a presentation level of 95 dBHL with contralateral masking which is  deemed as  fair. Left ear: 88% was obtained at a presentation level of 95 dBHL with contralateral masking which is deemed as  good .   The hearing test results were completed under headphones and re-checked with inserts and results are deemed to be of fair reliability. Test technique:  conventional     Impression: Repeat audiogram after normal left tympanometry. Recommend running real ear measurements with hearing aids to make sure they are working correctly. Of note, patient has [employer] hearing aids and its programming software may be locked.  PMH/Meds/All/SocHx/FamHx/ROS:   Past Medical History:  Diagnosis Date   Arthritis    Cancer (HCC) 2004   rectal cancer   Cataract    right eye   Chiari malformation    CKD (chronic kidney disease), stage IV (HCC)    Depression    Diabetes mellitus (HCC)    pre   Hypertension    Sleep apnea    no CPAP   Thyroid  disease    hyporthyroid     Past Surgical History:  Procedure Laterality Date   CATARACT EXTRACTION Bilateral    chiari malformation  2013   surgery   COLONOSCOPY     FLEXIBLE SIGMOIDOSCOPY     POLYPECTOMY      Family History  Problem Relation Age of Onset   Alzheimer's disease Mother    Stroke Mother        mini   Heart attack Mother    Hypertension Mother    Depression Other    Anemia Other    Obesity Other  Colon cancer Neg Hx    Esophageal cancer Neg Hx    Stomach cancer Neg Hx      Social Connections: Moderately Integrated (08/01/2023)   Social Connection and Isolation Panel    Frequency of Communication with Friends and Family: More than three times a week    Frequency of Social Gatherings with Friends and Family: Twice a week    Attends Religious Services: More than 4 times per year    Active Member of [employer] or Organizations: Yes    Attends Banker Meetings: Never    Marital Status: Widowed      Current Outpatient Medications:    acetaminophen  (TYLENOL ) 325 MG tablet, Take 1-2  tablets (325-650 mg total) by mouth every 4 (four) hours as needed for mild pain (pain score 1-3)., Disp: , Rfl:    amLODipine  (NORVASC ) 2.5 MG tablet, Take 1 tablet (2.5 mg total) by mouth daily., Disp: 30 tablet, Rfl: 3   aspirin  EC 81 MG tablet, Take 1 tablet (81 mg total) by mouth daily., Disp: , Rfl:    atorvastatin  (LIPITOR) 40 MG tablet, Take 1 tablet (40 mg total) by mouth daily., Disp: 90 tablet, Rfl: 3   benzonatate  (TESSALON ) 100 MG capsule, Take 1 capsule (100 mg total) by mouth every 8 (eight) hours., Disp: 21 capsule, Rfl: 0   Carboxymethylcellulose Sodium ([facility] LUBRICANT EYE DROPS OP), Apply 2 drops to eye 2 (two) times daily as needed., Disp: , Rfl:    cholecalciferol (VITAMIN D3) 25 MCG (1000 UT) tablet, Take 1,000 Units by mouth daily., Disp: , Rfl:    levothyroxine  (SYNTHROID ) 100 MCG tablet, Take 1 tablet (100 mcg total) by mouth daily. Follow-up due in June with labs, Disp: 90 tablet, Rfl: 0   vitamin B-12 (CYANOCOBALAMIN ) 100 MCG tablet, Take 100 mcg by mouth daily., Disp: , Rfl:    Physical Exam:   BP 116/68   Pulse 95   SpO2 94%   Pertinent Findings  CN II-XII intact Bilateral EAC clear and TM intact with well pneumatized middle ear spaces Anterior rhinoscopy: Septum midline; bilateral inferior turbinates with no hypertrophy No lesions of oral cavity/oropharynx; dentition dentures No obviously palpable neck masses/lymphadenopathy/thyromegaly No respiratory distress or stridor    Seprately Identifiable Procedures:  None  Impression & Plans:  [name] is a 89 y.o. female with the following   Hearing loss-  Patient here with hearing loss, some minimal asymmetry on exam today.  No significant changes from her reported baseline.  Her main complaint today is some discomfort and itching in the right ear.  She does use Q-tips in this is likely source of her ongoing irritation of the ear.  I recommend she avoid using those.  She also question using drops, sweet oil  drops would be reasonable although with hearing aids that this may cause dysfunction of the hearing aids.  She has a follow-up appointment with her audiologist and will have her hearing aids adjusted, I am happy to see her back in the office at any point in future for repeat evaluation.  Patient verbalized understanding and agreement to today's plan had no further questions or concerns.   - f/u PRN   Thank you for allowing me the opportunity to care for your patient. Please do not hesitate to contact me should you have any other questions.  Sincerely, Chyrl Cohen PA-C [facility] Phone: [phone] Fax: [phone]  08/13/2023, 11:52 AM

## 2023-08-13 NOTE — Progress Notes (Signed)
  [address], [address] [place], [place] [address] [phone]  Audiological Evaluation    Name: Lynn Ray     DOB:   Jul 05, 1934      MRN:   [id number]                                                                                     Service Date: 08/13/2023     Accompanied by: daughter   Patient comes today after Reyes Cohen, PA-C sent a referral for a hearing evaluation due to concerns with hearing loss.   Symptoms Yes Details  Hearing loss  [x]  Reports cannot hear well even with the aids in  Tinnitus  []    Ear pain/ infections/pressure  [x]  Reports right ear pain- had urgent care remove a dome after it remained stuck  in her right ear external ear canal  Balance problems  []    Noise exposure history  []    Previous ear surgeries  []    Family history of hearing loss  []    Amplification  []    Other  []      Otoscopy: Right ear: Clear external ear canal and notable landmarks visualized on the tympanic membrane. Left ear:  Clear external ear canal and notable landmarks visualized on the tympanic membrane.  Tympanometry: Right ear: Type A- Normal external ear canal volume with normal middle ear pressure and tympanic membrane compliance. Left ear: Normal external ear canal volume with negative middle ear pressure and reduced tympanic membrane compliance.    Pure tone Audiometry: Right ear- Mild to moderately severe sensorineural hearing loss from 125 Hz - 8000 Hz. Left ear-  Mild to moderately severe mixed hearing loss from 125 Hz - 8000 Hz.  Speech Audiometry: Right ear- Speech Reception Threshold (SRT) was obtained at 50 dBHL. Left ear-Speech Reception Threshold (SRT) was obtained at 50 dBHL.   Word Recognition Score Tested using NU-6 (recorded) Right ear: 76% was obtained at a presentation level of 95 dBHL with contralateral masking which is deemed as  fair. Left ear: 88% was obtained at a presentation level of 95 dBHL with contralateral masking which is  deemed as  good .   The hearing test results were completed under headphones and re-checked with inserts and results are deemed to be of fair reliability. Test technique:  conventional    Impression: Repeat audiogram after normal left tympanometry. Recommend running real ear measurements with hearing aids to make sure they are working correctly. Of note, patient has [employer] hearing aids and its programming software may be locked.   Recommendations: Follow up with ENT as scheduled for today. Return for a hearing evaluation if concerns with hearing changes arise or per MD recommendation.   [name], AUD

## 2023-09-09 ENCOUNTER — Other Ambulatory Visit: Payer: Self-pay | Admitting: Cardiology

## 2023-09-21 ENCOUNTER — Other Ambulatory Visit: Payer: Self-pay | Admitting: Internal Medicine

## 2023-09-27 ENCOUNTER — Other Ambulatory Visit: Payer: Self-pay | Admitting: Internal Medicine

## 2023-09-27 NOTE — Telephone Encounter (Signed)
 Copied from CRM [id number]. Topic: Clinical - Medication Refill >> Sep 27, 2023  4:01 PM Thersia C wrote: Medication: amLODipine  (NORVASC ) 2.5 MG tablet - Patient called in stated she needed to see if Dr.Crawford can get this medication refill for her   Has the patient contacted their pharmacy? Yes (Agent: If no, request that the patient contact the pharmacy for the refill. If patient does not wish to contact the pharmacy document the reason why and proceed with request.) (Agent: If yes, when and what did the pharmacy advise?)  This is the patient's preferred pharmacy:  [id number] - [place], [place] - [address] AT [facility] [address] [place] [address] Phone: [phone] Fax: [phone]    Is this the correct pharmacy for this prescription? Yes If no, delete pharmacy and type the correct one.   Has the prescription been filled recently? No  Is the patient out of the medication? Yes  Has the patient been seen for an appointment in the last year OR does the patient have an upcoming appointment? Yes  Can we respond through MyChart? Yes  Agent: Please be advised that Rx refills may take up to 3 business days. We ask that you follow-up with your pharmacy.

## 2023-10-02 MED ORDER — AMLODIPINE BESYLATE 2.5 MG PO TABS: 2.5000 mg | ORAL_TABLET | Freq: Every day | ORAL | 3 refills | Status: DC

## 2023-10-05 ENCOUNTER — Encounter: Payer: Self-pay | Admitting: Internal Medicine

## 2023-10-05 ENCOUNTER — Ambulatory Visit: Admitting: Internal Medicine

## 2023-10-05 VITALS — BP 126/80 | HR 79 | Temp 98.0°F | Ht 59.5 in | Wt 181.0 lb

## 2023-10-05 DIAGNOSIS — E039 Hypothyroidism, unspecified: Secondary | ICD-10-CM | POA: Diagnosis not present

## 2023-10-05 DIAGNOSIS — N184 Chronic kidney disease, stage 4 (severe): Secondary | ICD-10-CM | POA: Diagnosis not present

## 2023-10-05 DIAGNOSIS — R7303 Prediabetes: Secondary | ICD-10-CM | POA: Diagnosis not present

## 2023-10-05 DIAGNOSIS — Z23 Encounter for immunization: Secondary | ICD-10-CM | POA: Diagnosis not present

## 2023-10-05 DIAGNOSIS — Z0000 Encounter for general adult medical examination without abnormal findings: Secondary | ICD-10-CM

## 2023-10-05 DIAGNOSIS — I10 Essential (primary) hypertension: Secondary | ICD-10-CM | POA: Diagnosis not present

## 2023-10-05 DIAGNOSIS — H52223 Regular astigmatism, bilateral: Secondary | ICD-10-CM | POA: Diagnosis not present

## 2023-10-05 LAB — COMPREHENSIVE METABOLIC PANEL WITH GFR
ALT: 23 U/L (ref 0–35)
AST: 30 U/L (ref 0–37)
Albumin: 3.7 g/dL (ref 3.5–5.2)
Alkaline Phosphatase: 177 U/L — ABNORMAL HIGH (ref 39–117)
BUN: 21 mg/dL (ref 6–23)
CO2: 25 meq/L (ref 19–32)
Calcium: 9.8 mg/dL (ref 8.4–10.5)
Chloride: 108 meq/L (ref 96–112)
Creatinine, Ser: 2.06 mg/dL — ABNORMAL HIGH (ref 0.40–1.20)
GFR: 21.38 mL/min — ABNORMAL LOW (ref >60.00)
Glucose, Bld: 91 mg/dL (ref 70–99)
Potassium: 4.6 meq/L (ref 3.5–5.1)
Sodium: 139 meq/L (ref 135–145)
Total Bilirubin: 0.3 mg/dL (ref 0.2–1.2)
Total Protein: 7.3 g/dL (ref 6.0–8.3)

## 2023-10-05 LAB — LIPID PANEL
Cholesterol: 110 mg/dL (ref 0–200)
HDL: 58.4 mg/dL (ref >39.00)
LDL Cholesterol: 29 mg/dL (ref 0–99)
NonHDL: 51.58
Total CHOL/HDL Ratio: 2
Triglycerides: 113 mg/dL (ref 0.0–149.0)
VLDL: 22.6 mg/dL (ref 0.0–40.0)

## 2023-10-05 LAB — CBC
HCT: 32.7 % — ABNORMAL LOW (ref 36.0–46.0)
Hemoglobin: 10.4 g/dL — ABNORMAL LOW (ref 12.0–15.0)
MCHC: 32 g/dL (ref 30.0–36.0)
MCV: 87.7 fl (ref 78.0–100.0)
Platelets: 250 K/uL (ref 150.0–400.0)
RBC: 3.73 Mil/uL — ABNORMAL LOW (ref 3.87–5.11)
RDW: 13.9 % (ref 11.5–15.5)
WBC: 6.4 K/uL (ref 4.0–10.5)

## 2023-10-05 LAB — HEMOGLOBIN A1C: Hgb A1c MFr Bld: 6.3 % (ref 4.6–6.5)

## 2023-10-05 LAB — TSH: TSH: 0.13 u[IU]/mL — ABNORMAL LOW (ref 0.35–5.50)

## 2023-10-05 MED ORDER — LEVOTHYROXINE SODIUM 100 MCG PO TABS: 100.0000 ug | ORAL_TABLET | Freq: Every day | ORAL | 3 refills | Status: AC

## 2023-10-05 MED ORDER — AMLODIPINE BESYLATE 2.5 MG PO TABS: 2.5000 mg | ORAL_TABLET | Freq: Every day | ORAL | 3 refills | Status: AC

## 2023-10-05 NOTE — Assessment & Plan Note (Signed)
 Flu shot given. Pneumonia complete. Shingrix complete. Tetanus up to date. Colonoscopy aged out. Mammogram aged out, pap smear aged out and dexa complete do not have records. Counseled about sun safety and mole surveillance. Counseled about the dangers of distracted driving. Given 10 year screening recommendations.

## 2023-10-05 NOTE — Progress Notes (Signed)
   Subjective:   Patient ID: Lynn Ray, female    DOB: 1934/04/01, 89 y.o.   MRN: [id number]  The patient is here for physical. Pertinent topics discussed: Discussed the use of AI scribe software for clinical note transcription with the patient, who gave verbal consent to proceed.  History of Present Illness Lynn Ray is an 89 year old female with hypertension who presents for blood pressure management and medication refill.  Her blood pressure was elevated today, and she uses a wrist blood pressure monitor at home but is uncertain about its accuracy. Her blood pressure was normal at an ENT visit in July and at a recent health fair. A nurse at her church also checked her blood pressure, which was 'pretty good', though not perfect.  She is currently on blood pressure medication as recommended by her cardiologist. There was an issue with the pharmacy not refilling her prescription initially, but it was resolved. She is on a 30-day supply but prefers a 90-day supply for convenience.  She experiences random chest pressure but no new chest pain or breathing difficulties. She occasionally has heartburn, which is diet-dependent, and sometimes experiences diarrhea but not constipation.  She reports worsening hand function, particularly in her right hand, affecting her handwriting and ability to perform fine motor tasks like buttoning and putting on earrings. She describes having 'a case of the dropsies', dropping items frequently.  She received an RSV shot at a pharmacy and is due for a flu shot today. She mentions occasional swelling in her ankles.  PMH, [facility], social history reviewed and updated  Review of Systems  Constitutional: Negative.   HENT: Negative.    Eyes: Negative.   Respiratory:  Negative for cough, chest tightness and shortness of breath.   Cardiovascular:  Negative for chest pain, palpitations and leg swelling.  Gastrointestinal:  Negative for abdominal distention, abdominal  pain, constipation, diarrhea, nausea and vomiting.  Musculoskeletal:  Positive for arthralgias.  Skin: Negative.   Neurological: Negative.   Psychiatric/Behavioral: Negative.      Objective:  Physical Exam Constitutional:      Appearance: She is well-developed.  HENT:     Head: Normocephalic and atraumatic.  Cardiovascular:     Rate and Rhythm: Normal rate and regular rhythm.  Pulmonary:     Effort: Pulmonary effort is normal. No respiratory distress.     Breath sounds: Normal breath sounds. No wheezing or rales.  Abdominal:     General: Bowel sounds are normal. There is no distension.     Palpations: Abdomen is soft.     Tenderness: There is no abdominal tenderness.  Musculoskeletal:        General: Tenderness present.     Cervical back: Normal range of motion.  Skin:    General: Skin is warm and dry.  Neurological:     Mental Status: She is alert and oriented to person, place, and time.     Coordination: Coordination normal.     Vitals:   10/05/23 1054 10/05/23 1127  BP: (!) 158/80 126/80  Pulse: 79   Temp: 98 F (36.7 C)   TempSrc: Oral   SpO2: 99%   Weight: 181 lb (82.1 kg)   Height: 4' 11.5 (1.511 m)     Assessment & Plan:  Flu shot given at visit

## 2023-10-05 NOTE — Assessment & Plan Note (Addendum)
 BP at goal recent appointments and recheck close to normal here. Will keep regimen the same with amlodipine .Checking CMP.

## 2023-10-05 NOTE — Assessment & Plan Note (Signed)
 Checking CMP for stability.

## 2023-10-05 NOTE — Assessment & Plan Note (Signed)
 Checking HgA1c and adjust as needed.

## 2023-10-05 NOTE — Assessment & Plan Note (Signed)
Checking TSH and adjust as needed synthroid 100 mcg daily.

## 2023-10-10 ENCOUNTER — Ambulatory Visit: Payer: Self-pay | Admitting: Internal Medicine

## 2023-10-18 ENCOUNTER — Telehealth: Payer: Self-pay | Admitting: Radiology

## 2023-10-18 DIAGNOSIS — Z8673 Personal history of transient ischemic attack (TIA), and cerebral infarction without residual deficits: Secondary | ICD-10-CM

## 2023-10-18 NOTE — Telephone Encounter (Signed)
 Please place referral if can

## 2023-10-18 NOTE — Telephone Encounter (Signed)
 Copied from CRM [id number]. Topic: Referral - Question >> Oct 18, 2023 12:24 PM Harlene ORN wrote: Reason for CRM: Patient called to see if she has been referred to Aquatic Therapy in [place] for Dr. Rollene yet. Please call back the patient to confirm referral.

## 2023-10-19 DIAGNOSIS — E039 Hypothyroidism, unspecified: Secondary | ICD-10-CM | POA: Diagnosis not present

## 2023-10-19 DIAGNOSIS — I129 Hypertensive chronic kidney disease with stage 1 through stage 4 chronic kidney disease, or unspecified chronic kidney disease: Secondary | ICD-10-CM | POA: Diagnosis not present

## 2023-10-19 DIAGNOSIS — R7303 Prediabetes: Secondary | ICD-10-CM | POA: Diagnosis not present

## 2023-10-19 DIAGNOSIS — D472 Monoclonal gammopathy: Secondary | ICD-10-CM | POA: Diagnosis not present

## 2023-10-19 DIAGNOSIS — N184 Chronic kidney disease, stage 4 (severe): Secondary | ICD-10-CM | POA: Diagnosis not present

## 2023-10-22 ENCOUNTER — Ambulatory Visit: Admitting: Internal Medicine

## 2023-10-22 NOTE — Telephone Encounter (Signed)
 I looked all through referrals last Friday and was unable to find also I have called [facility] and call was unsuccessful to see how we can place a referral for aquatic therapy

## 2023-10-22 NOTE — Telephone Encounter (Signed)
 Is there a specific condition she wanted to be referred to PT for?

## 2023-10-23 NOTE — Telephone Encounter (Unsigned)
 Copied from CRM #[id number]. Topic: General - Other >> Oct 23, 2023  9:01 AM Aleatha C wrote: Reason for CRM: Patient called to confirm that she wants to go to Aquatic therapy  because she believes it will benefit her in her from recovering from her stroke and improve her mobility in her limbs she was at a conference at [facility] and they had a table set up discussing all the benefits and that's why she asked for the referral please give her a call to further discuss, she wont be available today but will be tomorrow 9/24 morning

## 2023-10-24 NOTE — Telephone Encounter (Signed)
 Called patient and informed her about her referral she verbalized she understood and if no one has contacted her then she should call us  back

## 2023-10-24 NOTE — Telephone Encounter (Signed)
 Referral placed.

## 2023-10-24 NOTE — Addendum Note (Signed)
 Addended by: ROLLENE ALMARIE LABOR on: 10/24/2023 09:27 AM   Modules accepted: Orders

## 2023-11-22 NOTE — Therapy (Signed)
 OUTPATIENT PHYSICAL THERAPY NEURO EVALUATION   Patient Name: Lynn Ray MRN: [id number] DOB:[birth year], 89 y.o., female Today's Date: 11/23/2023   PCP: Rollene Almarie LABOR, MD  REFERRING PROVIDER: Rollene Almarie LABOR, MD   END OF SESSION:  PT End of Session - 11/23/23 1148     Visit Number 1    Number of Visits 17    Date for Recertification  01/25/24    Authorization Type [employer]    Authorization Time Period auth submitted    PT Start Time 1108    PT Stop Time 1143    PT Time Calculation (min) 35 min    Equipment Utilized During Treatment Gait belt    Activity Tolerance Patient tolerated treatment well    Behavior During Therapy WFL for tasks assessed/performed          Past Medical History:  Diagnosis Date   Arthritis    Cancer (HCC) 2004   rectal cancer   Cataract    right eye   Chiari malformation    CKD (chronic kidney disease), stage IV (HCC)    Depression    Diabetes mellitus (HCC)    pre   Hypertension    Sleep apnea    no CPAP   Thyroid  disease    hyporthyroid   Past Surgical History:  Procedure Laterality Date   CATARACT EXTRACTION Bilateral    chiari malformation  2013   surgery   COLONOSCOPY     FLEXIBLE SIGMOIDOSCOPY     POLYPECTOMY     Patient Active Problem List   Diagnosis Date Noted   Sinus pause 07/06/2023   Aphasia, late effect of cerebrovascular disease 04/27/2023   Hx of completed stroke 03/16/2023   Anemia 02/23/2023   Vision changes 04/18/2022   Pre-diabetes 09/29/2020   Stage 4 chronic kidney disease (HCC) 12/22/2019   History of Chiari malformation 10/09/2017   Chronic right shoulder pain 10/09/2017   Routine general medical examination at a health care facility 07/13/2017   History of malignant neoplasm of large intestine 11/18/2010   BARRETT'S ESOPHAGUS 01/14/2008   Hypothyroidism 12/10/2007   Morbid obesity (HCC) 12/10/2007   Essential hypertension 12/10/2007   AORTIC VALVE DISORDERS 12/10/2007    COPD 12/10/2007   GERD 12/09/2007    ONSET DATE: 02/22/23  REFERRING DIAG: S13.26 (ICD-10-CM) - Hx of completed stroke Rollene Almarie LABOR, MD  THERAPY DIAG:  Hemiplegia and hemiparesis following cerebral infarction affecting right dominant side (HCC)  Other abnormalities of gait and mobility  Unsteadiness on feet  Muscle weakness (generalized)  Rationale for Evaluation and Treatment: Rehabilitation  SUBJECTIVE:  SUBJECTIVE STATEMENT: Patient reports that she had a stroke February 22 2023. Had some HH after that. Reports that she is still dealing with some issues finding the right word and difficulty using the R hand and reaching overhead. Trying to get back into crafts. Had a fall stepping up onto a curb in Feb or March. Pt is R handed.    Pt accompanied by: family memberdaughter  PERTINENT HISTORY: Rectal CA, chiari malformation s/p surgery, CKD IV, depression, DM, HTN,   PAIN:  Are you having pain? Yes: NPRS scale: 5/10 Pain location: back of head and neck Pain description: ache Aggravating factors: pt reports s/p surgery, sleeping position Relieving factors: tylenol    PRECAUTIONS: Fall  RED FLAGS: None   WEIGHT BEARING RESTRICTIONS: No  FALLS: Has patient fallen in last 6 months? Yes. Number of falls 1  LIVING ENVIRONMENT: Lives with: lives alone and has a PCA and daughter that stays with her on nights  Lives in: House/apartment Stairs: 2 steps to enter ; split level home; bedroom on 2nd floor Has following equipment at home: Single point cane, Shower bench, and Grab bars  PLOF: Independent with basic ADLs and Leisure: crafts; PCA helps with more vigorous chores   PATIENT GOALS: get back to normal as much as possible   OBJECTIVE:  Note: Objective measures were completed at  Evaluation unless otherwise noted.  DIAGNOSTIC FINDINGS: none recent  COGNITION: Overall cognitive status: Within functional limits for tasks assessed   SENSATION: Pt reports R>L hand numbness   COORDINATION: Alternating pronation/supination: slowed B Alternating toe tap: WNL B Finger to nose: slight dysmetria R>L   MUSCLE TONE: WNL B LEs  POSTURE: rounded shoulders and forward head  LOWER EXTREMITY ROM:     Active  Right Eval Left Eval  Hip flexion    Hip extension    Hip abduction    Hip adduction    Hip internal rotation    Hip external rotation    Knee flexion    Knee extension    Ankle dorsiflexion 15 10  Ankle plantarflexion    Ankle inversion    Ankle eversion     (Blank rows = not tested)  LOWER EXTREMITY MMT:    MMT  Right Eval Left Eval  Hip flexion 4 4+  Hip extension    Hip abduction 4+ 4+  Hip adduction 4 4  Hip internal rotation    Hip external rotation    Knee flexion 4 4  Knee extension 4 4  Ankle dorsiflexion 3+ 4  Ankle plantarflexion 4 4  Ankle inversion    Ankle eversion    (Blank rows = not tested)  GAIT: Findings: Assistive device utilized:Single point cane, Level of assistance: SBA, and Comments: slowed, foot flat B and shuffling gait   FUNCTIONAL TESTS:  5 times sit to stand: 22.04 sec without standing fully and pushing off knees 10 meter walk test: 22.59 sec (1.45 ft/sec)  TREATMENT DATE: 11/23/23    PATIENT EDUCATION: Education details: edu on benefits of OT and ST for her complaints- pt agreeable to this; prognosis, POC, exam findings as they relate to functional impairments  Person educated: Patient and daughter Education method: Explanation Education comprehension: verbalized understanding  HOME EXERCISE PROGRAM: Not yet initiated  GOALS: Goals reviewed with patient? Yes  SHORT TERM  GOALS: Target date: 12/21/2023  Patient to be independent with initial HEP. Baseline: HEP initiated Goal status: INITIAL    LONG TERM GOALS: Target date: 01/25/24  Patient to be independent with advanced HEP. Baseline: Not yet initiated  Goal status: INITIAL  Patient to demonstrate B LE strength >/=4+/5.  Baseline: See above Goal status: INITIAL  Patient to demonstrate 30% improvement in foot clearance with gait.  Baseline: foot flat B Goal status: INITIAL  Patient to demonstrate gait speed of at least 2 ft/sec in order to improve falls risk.  Baseline: 1.45 ft/sec Goal status: INITIAL  Patient to demonstrate 5xSTS test in <15 sec in order to decrease risk of falls.  Baseline: 22.05 Goal status: INITIAL   ASSESSMENT:  CLINICAL IMPRESSION:  Patient is an 89 y/o F presenting to OPPT with c/o imbalance, R UE weakness, and word finding issues s/p CVA on 02/22/23.  Patient today presenting with Discoordination, rounded posture, LE weakness, gait deviations, and decreased transfer and gait speed.. Would benefit from skilled PT services 2 x/week for 8 weeks to address aforementioned impairments in order to optimize level of function.    OBJECTIVE IMPAIRMENTS: Abnormal gait, decreased activity tolerance, decreased balance, decreased coordination, decreased endurance, difficulty walking, decreased strength, postural dysfunction, and pain.   ACTIVITY LIMITATIONS: carrying, lifting, bending, sitting, standing, squatting, sleeping, stairs, transfers, bed mobility, bathing, toileting, dressing, reach over head, hygiene/grooming, and locomotion level  PARTICIPATION LIMITATIONS: meal prep, shopping, community activity, and church  PERSONAL FACTORS: Age, Past/current experiences, Time since onset of injury/illness/exacerbation, and 3+ comorbidities: Rectal CA, chiari malformation s/p surgery, CKD IV, depression, DM, HTN,  are also affecting patient's functional outcome.   REHAB  POTENTIAL: Good  CLINICAL DECISION MAKING: Evolving/moderate complexity  EVALUATION COMPLEXITY: Moderate  PLAN:  PT FREQUENCY: 2x/week  PT DURATION: 8 weeks  PLANNED INTERVENTIONS: 97110-Therapeutic exercises, 97530- Therapeutic activity, [id number]- Neuromuscular re-education, 97535- Self Care, [id number]- Manual therapy, [phone]- Gait training, [phone]- Orthotic Initial, [phone]- Canalith repositioning, [phone]- Aquatic Therapy, [phone]- Electrical stimulation (manual), Patient/Family education, Balance training, Stair training, Taping, Vestibular training, Cryotherapy, and Moist heat  PLAN FOR NEXT SESSION: initiate HEP for LE strength, balance, work on speed of movement, transfers, foot clearance; check on OT and ST referrals  Louana Terrilyn Christians, PT, DPT 11/23/23 12:00 PM  [facility] Re[facility] at [facility] [address], [address] [place], [place] [address] Phone # [phone] Fax # [phone]      Referring diagnosis? Z86.73 (ICD-10-CM) - Hx of completed stroke Treatment diagnosis? (if different than referring diagnosis)  Hemiplegia and hemiparesis following cerebral infarction affecting right dominant side (HCC)  Other abnormalities of gait and mobility  Unsteadiness on feet  Muscle weakness (generalized) What was this (referring dx) caused by? []  Surgery []  Fall []  Ongoing issue []  Arthritis [x]  Other: __stroke__________  Laterality: [x]  Rt []  Lt []  Both  Check all possible CPT codes:  *CHOOSE 10 OR LESS*    See Planned Interventions listed in the Plan section of the Evaluation.

## 2023-11-23 ENCOUNTER — Telehealth: Payer: Self-pay | Admitting: Physical Therapy

## 2023-11-23 ENCOUNTER — Other Ambulatory Visit: Payer: Self-pay

## 2023-11-23 ENCOUNTER — Encounter: Payer: Self-pay | Admitting: Physical Therapy

## 2023-11-23 ENCOUNTER — Ambulatory Visit: Attending: Internal Medicine | Admitting: Physical Therapy

## 2023-11-23 DIAGNOSIS — M6281 Muscle weakness (generalized): Secondary | ICD-10-CM | POA: Insufficient documentation

## 2023-11-23 DIAGNOSIS — R2689 Other abnormalities of gait and mobility: Secondary | ICD-10-CM | POA: Diagnosis not present

## 2023-11-23 DIAGNOSIS — Z8673 Personal history of transient ischemic attack (TIA), and cerebral infarction without residual deficits: Secondary | ICD-10-CM | POA: Insufficient documentation

## 2023-11-23 DIAGNOSIS — I69351 Hemiplegia and hemiparesis following cerebral infarction affecting right dominant side: Secondary | ICD-10-CM | POA: Diagnosis not present

## 2023-11-23 DIAGNOSIS — R4701 Aphasia: Secondary | ICD-10-CM

## 2023-11-23 DIAGNOSIS — R2681 Unsteadiness on feet: Secondary | ICD-10-CM | POA: Diagnosis not present

## 2023-11-23 DIAGNOSIS — R29898 Other symptoms and signs involving the musculoskeletal system: Secondary | ICD-10-CM

## 2023-11-23 NOTE — Telephone Encounter (Signed)
 Dr. Rollene,  Ms. Blatt was evaluated by OPPT s/p CVA per your referral.  The patient would benefit from OT and ST evaluation for reduced hand dexterity and aphasia.    If you agree, please place an order in [facility] in [facility] or fax the order to [phone].  Thank you,  Louana Terrilyn Christians, PT, DPT 11/23/23 12:09 PM  [facility] Outpatient Rehab at [facility] [address], [address] [place], [place] [address] Phone # [phone] Fax # [phone]

## 2023-11-26 ENCOUNTER — Telehealth: Payer: Self-pay

## 2023-11-26 NOTE — Telephone Encounter (Signed)
 Ok to place OT and ST for co-sign

## 2023-11-26 NOTE — Addendum Note (Signed)
 Addended by: Srihith Aquilino E on: 11/26/2023 08:54 AM   Modules accepted: Orders

## 2023-11-26 NOTE — Telephone Encounter (Signed)
 Copied from CRM [id number]. Topic: Referral - Question >> Nov 26, 2023 11:21 AM Laymon HERO wrote: Reason for CRM: Patient calling to get an update on referral to Aquatic therapy at [facility]-

## 2023-11-26 NOTE — Telephone Encounter (Signed)
 Referrals to OT and ST placed.

## 2023-11-26 NOTE — Telephone Encounter (Signed)
 Called pt and gave number to schedule per referral

## 2023-12-04 ENCOUNTER — Other Ambulatory Visit: Payer: Self-pay

## 2023-12-04 ENCOUNTER — Encounter: Payer: Self-pay | Admitting: Physical Therapy

## 2023-12-04 ENCOUNTER — Ambulatory Visit: Attending: Internal Medicine | Admitting: Physical Therapy

## 2023-12-04 DIAGNOSIS — R2689 Other abnormalities of gait and mobility: Secondary | ICD-10-CM | POA: Diagnosis not present

## 2023-12-04 DIAGNOSIS — R278 Other lack of coordination: Secondary | ICD-10-CM | POA: Insufficient documentation

## 2023-12-04 DIAGNOSIS — R4701 Aphasia: Secondary | ICD-10-CM | POA: Insufficient documentation

## 2023-12-04 DIAGNOSIS — M6281 Muscle weakness (generalized): Secondary | ICD-10-CM | POA: Insufficient documentation

## 2023-12-04 DIAGNOSIS — R2681 Unsteadiness on feet: Secondary | ICD-10-CM | POA: Diagnosis not present

## 2023-12-04 DIAGNOSIS — D472 Monoclonal gammopathy: Secondary | ICD-10-CM

## 2023-12-04 DIAGNOSIS — I69351 Hemiplegia and hemiparesis following cerebral infarction affecting right dominant side: Secondary | ICD-10-CM | POA: Insufficient documentation

## 2023-12-04 NOTE — Therapy (Signed)
 OUTPATIENT PHYSICAL THERAPY NEURO TREATMENT NOTE   Patient Name: Lynn Ray MRN: [id number] DOB:[birth year], 89 y.o., female Today's Date: 12/04/2023   PCP: Rollene Almarie LABOR, MD  REFERRING PROVIDER: Rollene Almarie LABOR, MD   END OF SESSION:  PT End of Session - 12/04/23 1405     Visit Number 2    Number of Visits 17    Date for Recertification  01/25/24    Authorization Type [employer]    Authorization Time Period auth submitted    PT Start Time 1403    PT Stop Time 1441    PT Time Calculation (min) 38 min    Equipment Utilized During Treatment Gait belt    Activity Tolerance Patient tolerated treatment well    Behavior During Therapy [facility] for tasks assessed/performed           Past Medical History:  Diagnosis Date   Arthritis    Cancer (HCC) 2004   rectal cancer   Cataract    right eye   Chiari malformation    CKD (chronic kidney disease), stage IV (HCC)    Depression    Diabetes mellitus (HCC)    pre   Hypertension    Sleep apnea    no CPAP   Thyroid  disease    hyporthyroid   Past Surgical History:  Procedure Laterality Date   CATARACT EXTRACTION Bilateral    chiari malformation  2013   surgery   COLONOSCOPY     FLEXIBLE SIGMOIDOSCOPY     POLYPECTOMY     Patient Active Problem List   Diagnosis Date Noted   Sinus pause 07/06/2023   Aphasia, late effect of cerebrovascular disease 04/27/2023   Hx of completed stroke 03/16/2023   Anemia 02/23/2023   Vision changes 04/18/2022   Pre-diabetes 09/29/2020   Stage 4 chronic kidney disease (HCC) 12/22/2019   History of Chiari malformation 10/09/2017   Chronic right shoulder pain 10/09/2017   Routine general medical examination at a health care facility 07/13/2017   History of malignant neoplasm of large intestine 11/18/2010   BARRETT'S ESOPHAGUS 01/14/2008   Hypothyroidism 12/10/2007   Morbid obesity (HCC) 12/10/2007   Essential hypertension 12/10/2007   AORTIC VALVE DISORDERS  12/10/2007   COPD 12/10/2007   GERD 12/09/2007    ONSET DATE: 02/22/23  REFERRING DIAG: S13.26 (ICD-10-CM) - Hx of completed stroke Rollene Almarie LABOR, MD  THERAPY DIAG:  Other abnormalities of gait and mobility  Unsteadiness on feet  Muscle weakness (generalized)  Rationale for Evaluation and Treatment: Rehabilitation  SUBJECTIVE:  SUBJECTIVE STATEMENT: Want to work on strength and balance.    Pt accompanied by: family memberdaughter  PERTINENT HISTORY: Rectal CA, chiari malformation s/p surgery, CKD IV, depression, DM, HTN,   PAIN:  Are you having pain? No  PRECAUTIONS: Fall  RED FLAGS: None   WEIGHT BEARING RESTRICTIONS: No  FALLS: Has patient fallen in last 6 months? Yes. Number of falls 1  LIVING ENVIRONMENT: Lives with: lives alone and has a PCA and daughter that stays with her on nights  Lives in: House/apartment Stairs: 2 steps to enter ; split level home; bedroom on 2nd floor Has following equipment at home: Single point cane, Shower bench, and Grab bars  PLOF: Independent with basic ADLs and Leisure: crafts; PCA helps with more vigorous chores   PATIENT GOALS: get back to normal as much as possible   OBJECTIVE:    TODAY'S TREATMENT: 12/04/2023 Activity Comments  Sit to stand: 5 reps C/o pain in knees  LAQ 2 x 10 Seated march 2 x 10 Seated ankle pumps 2 x 10 Seated hamstring curls 2 x 10, red band   Standing heel/toe raises 10 reps   Sidestepping along counter, 3 reps Decreased foot clearance RLE, decreased SLS noted throughout  SLS taps to 4 step, 2 x 10 BUE>1 UE support  Standing hip abduction step taps Hip extension step taps  10 reps at counter, BUE support-increased lean through RUE and reports pain, so transitioned to step taps instead of kicks    Access Code: [email or web address] URL: [email or web address] Date: 12/04/2023 Prepared by: [facility] - Outpatient  Rehab - [facility]  Exercises - Seated Long Arc Quad  - 1 x daily - 7 x weekly - 3 sets - 10 reps - Seated March  - 1 x daily - 7 x weekly - 3 sets - 10 reps - Seated Ankle Dorsiflexion AROM  - 1 x daily - 7 x weekly - 3 sets - 10 reps  PATIENT EDUCATION: Education details: HEP initiated Person educated: Patient Education method: Explanation, Demonstration, and Handouts Education comprehension: verbalized understanding, returned demonstration, and needs further education  ------------------------------------- Note: Objective measures were completed at Evaluation unless otherwise noted.  DIAGNOSTIC FINDINGS: none recent  COGNITION: Overall cognitive status: Within functional limits for tasks assessed   SENSATION: Pt reports R>L hand numbness   COORDINATION: Alternating pronation/supination: slowed B Alternating toe tap: WNL B Finger to nose: slight dysmetria R>L   MUSCLE TONE: WNL B LEs  POSTURE: rounded shoulders and forward head  LOWER EXTREMITY ROM:     Active  Right Eval Left Eval  Hip flexion    Hip extension    Hip abduction    Hip adduction    Hip internal rotation    Hip external rotation    Knee flexion    Knee extension    Ankle dorsiflexion 15 10  Ankle plantarflexion    Ankle inversion    Ankle eversion     (Blank rows = not tested)  LOWER EXTREMITY MMT:    MMT  Right Eval Left Eval  Hip flexion 4 4+  Hip extension    Hip abduction 4+ 4+  Hip adduction 4 4  Hip internal rotation    Hip external rotation    Knee flexion 4 4  Knee extension 4 4  Ankle dorsiflexion 3+ 4  Ankle plantarflexion 4 4  Ankle inversion    Ankle eversion    (Blank rows = not tested)  GAIT: Findings: Assistive device utilized:Single point cane,  Level of assistance: SBA, and Comments: slowed, foot flat B and shuffling gait    FUNCTIONAL TESTS:  5 times sit to stand: 22.04 sec without standing fully and pushing off knees 10 meter walk test: 22.59 sec (1.45 ft/sec)                                                                                                                                TREATMENT DATE: 11/23/23    PATIENT EDUCATION: Education details: edu on benefits of OT and ST for her complaints- pt agreeable to this; prognosis, POC, exam findings as they relate to functional impairments  Person educated: Patient and daughter Education method: Explanation Education comprehension: verbalized understanding  HOME EXERCISE PROGRAM: Not yet initiated  GOALS: Goals reviewed with patient? Yes  SHORT TERM GOALS: Target date: 12/21/2023  Patient to be independent with initial HEP. Baseline: HEP initiated Goal status: INITIAL    LONG TERM GOALS: Target date: 01/25/24  Patient to be independent with advanced HEP. Baseline: Not yet initiated  Goal status: INITIAL  Patient to demonstrate B LE strength >/=4+/5.  Baseline: See above Goal status: INITIAL  Patient to demonstrate 30% improvement in foot clearance with gait.  Baseline: foot flat B Goal status: INITIAL  Patient to demonstrate gait speed of at least 2 ft/sec in order to improve falls risk.  Baseline: 1.45 ft/sec Goal status: INITIAL  Patient to demonstrate 5xSTS test in <15 sec in order to decrease risk of falls.  Baseline: 22.05 Goal status: INITIAL   ASSESSMENT:  CLINICAL IMPRESSION: Pt presents today with no new complaints. Skilled PT session focused on initiating HEP for strength and balance; with attempts at repeated sit to stand exercise, pt's knees bother her from arthritis pain.  She seems pleased with initial seated HEP to address BLE strengthening and flexibility.  With standing exercises, she has heavy UE support at counter and decreased stance time, decreased foot clearance on RLE.  She will continue to benefit from  skilled PT towards goals for improved functional mobility and decreased fall risk.    OBJECTIVE IMPAIRMENTS: Abnormal gait, decreased activity tolerance, decreased balance, decreased coordination, decreased endurance, difficulty walking, decreased strength, postural dysfunction, and pain.   ACTIVITY LIMITATIONS: carrying, lifting, bending, sitting, standing, squatting, sleeping, stairs, transfers, bed mobility, bathing, toileting, dressing, reach over head, hygiene/grooming, and locomotion level  PARTICIPATION LIMITATIONS: meal prep, shopping, community activity, and church  PERSONAL FACTORS: Age, Past/current experiences, Time since onset of injury/illness/exacerbation, and 3+ comorbidities: Rectal CA, chiari malformation s/p surgery, CKD IV, depression, DM, HTN,  are also affecting patient's functional outcome.   REHAB POTENTIAL: Good  CLINICAL DECISION MAKING: Evolving/moderate complexity  EVALUATION COMPLEXITY: Moderate  PLAN:  PT FREQUENCY: 2x/week  PT DURATION: 8 weeks  PLANNED INTERVENTIONS: 97110-Therapeutic exercises, 97530- Therapeutic activity, [id number]- Neuromuscular re-education, 97535- Self Care, 02859- Manual therapy, [id number]- Gait training, [phone]- Orthotic Initial, [phone]- Canalith repositioning, [id number]- Aquatic Therapy, [phone]- Electrical stimulation (manual), Patient/Family education,  Balance training, Stair training, Taping, Vestibular training, Cryotherapy, and Moist heat  PLAN FOR NEXT SESSION: Review and progress HEP for RLE strength, balance, work on speed of movement, transfers, foot clearance  Greig Anon, PT 12/04/23 6:15 PM Phone: [phone] Fax: [phone]  [facility] Health Outpatient Rehab at [facility] [address], [address] [place], [place] [address] Phone # [phone] Fax # [phone]

## 2023-12-05 ENCOUNTER — Inpatient Hospital Stay: Payer: Medicare PPO | Attending: Hematology

## 2023-12-05 DIAGNOSIS — Z85048 Personal history of other malignant neoplasm of rectum, rectosigmoid junction, and anus: Secondary | ICD-10-CM | POA: Diagnosis not present

## 2023-12-05 DIAGNOSIS — Z87891 Personal history of nicotine dependence: Secondary | ICD-10-CM | POA: Diagnosis not present

## 2023-12-05 DIAGNOSIS — D472 Monoclonal gammopathy: Secondary | ICD-10-CM

## 2023-12-05 DIAGNOSIS — D649 Anemia, unspecified: Secondary | ICD-10-CM | POA: Diagnosis not present

## 2023-12-05 DIAGNOSIS — N184 Chronic kidney disease, stage 4 (severe): Secondary | ICD-10-CM | POA: Diagnosis not present

## 2023-12-05 DIAGNOSIS — R7989 Other specified abnormal findings of blood chemistry: Secondary | ICD-10-CM | POA: Diagnosis not present

## 2023-12-05 LAB — CMP (CANCER CENTER ONLY)
ALT: 10 U/L (ref 0–44)
AST: 17 U/L (ref 15–41)
Albumin: 3.8 g/dL (ref 3.5–5.0)
Alkaline Phosphatase: 118 U/L (ref 38–126)
Anion gap: 5 (ref 5–15)
BUN: 25 mg/dL — ABNORMAL HIGH (ref 8–23)
CO2: 24 mmol/L (ref 22–32)
Calcium: 9.8 mg/dL (ref 8.9–10.3)
Chloride: 111 mmol/L (ref 98–111)
Creatinine: 2.24 mg/dL — ABNORMAL HIGH (ref 0.44–1.00)
GFR, Estimated: 21 mL/min — ABNORMAL LOW (ref >60)
Glucose, Bld: 100 mg/dL — ABNORMAL HIGH (ref 70–99)
Potassium: 4.3 mmol/L (ref 3.5–5.1)
Sodium: 140 mmol/L (ref 135–145)
Total Bilirubin: 0.3 mg/dL (ref 0.0–1.2)
Total Protein: 7.3 g/dL (ref 6.5–8.1)

## 2023-12-05 LAB — CBC WITH DIFFERENTIAL (CANCER CENTER ONLY)
Abs Immature Granulocytes: 0.01 K/uL (ref 0.00–0.07)
Basophils Absolute: 0.1 K/uL (ref 0.0–0.1)
Basophils Relative: 1 %
Eosinophils Absolute: 0.4 K/uL (ref 0.0–0.5)
Eosinophils Relative: 7 %
HCT: 32.6 % — ABNORMAL LOW (ref 36.0–46.0)
Hemoglobin: 10.4 g/dL — ABNORMAL LOW (ref 12.0–15.0)
Immature Granulocytes: 0 %
Lymphocytes Relative: 28 %
Lymphs Abs: 1.7 K/uL (ref 0.7–4.0)
MCH: 28.1 pg (ref 26.0–34.0)
MCHC: 31.9 g/dL (ref 30.0–36.0)
MCV: 88.1 fL (ref 80.0–100.0)
Monocytes Absolute: 0.5 K/uL (ref 0.1–1.0)
Monocytes Relative: 8 %
Neutro Abs: 3.5 K/uL (ref 1.7–7.7)
Neutrophils Relative %: 56 %
Platelet Count: 251 K/uL (ref 150–400)
RBC: 3.7 MIL/uL — ABNORMAL LOW (ref 3.87–5.11)
RDW: 13.2 % (ref 11.5–15.5)
WBC Count: 6.2 K/uL (ref 4.0–10.5)
nRBC: 0 % (ref 0.0–0.2)

## 2023-12-05 NOTE — Therapy (Signed)
 OUTPATIENT PHYSICAL THERAPY NEURO TREATMENT NOTE   Patient Name: Lynn Ray MRN: [id number] DOB:Sep 16, 1934, 89 y.o., female Today's Date: 12/06/2023   PCP: Rollene Almarie LABOR, MD  REFERRING PROVIDER: Rollene Almarie LABOR, MD   END OF SESSION:  PT End of Session - 12/06/23 1446     Visit Number 3    Number of Visits 17    Date for Recertification  01/25/24    Authorization Type [employer]    Authorization Time Period 8 visits 10/24-12/26    PT Start Time 1408    PT Stop Time 1451    PT Time Calculation (min) 43 min    Equipment Utilized During Treatment Gait belt    Activity Tolerance Patient tolerated treatment well;Patient limited by fatigue    Behavior During Therapy WFL for tasks assessed/performed            Past Medical History:  Diagnosis Date   Arthritis    Cancer (HCC) 2004   rectal cancer   Cataract    right eye   Chiari malformation    CKD (chronic kidney disease), stage IV (HCC)    Depression    Diabetes mellitus (HCC)    pre   Hypertension    Sleep apnea    no CPAP   Thyroid  disease    hyporthyroid   Past Surgical History:  Procedure Laterality Date   CATARACT EXTRACTION Bilateral    chiari malformation  2013   surgery   COLONOSCOPY     FLEXIBLE SIGMOIDOSCOPY     POLYPECTOMY     Patient Active Problem List   Diagnosis Date Noted   Sinus pause 07/06/2023   Aphasia, late effect of cerebrovascular disease 04/27/2023   Hx of completed stroke 03/16/2023   Anemia 02/23/2023   Vision changes 04/18/2022   Pre-diabetes 09/29/2020   Stage 4 chronic kidney disease (HCC) 12/22/2019   History of Chiari malformation 10/09/2017   Chronic right shoulder pain 10/09/2017   Routine general medical examination at a health care facility 07/13/2017   History of malignant neoplasm of large intestine 11/18/2010   BARRETT'S ESOPHAGUS 01/14/2008   Hypothyroidism 12/10/2007   Morbid obesity (HCC) 12/10/2007   Essential hypertension 12/10/2007    AORTIC VALVE DISORDERS 12/10/2007   COPD 12/10/2007   GERD 12/09/2007    ONSET DATE: 02/22/23  REFERRING DIAG: S13.26 (ICD-10-CM) - Hx of completed stroke Rollene Almarie LABOR, MD  THERAPY DIAG:  Other abnormalities of gait and mobility  Unsteadiness on feet  Muscle weakness (generalized)  Rationale for Evaluation and Treatment: Rehabilitation  SUBJECTIVE:  SUBJECTIVE STATEMENT: I have trouble hearing, L ear is better. Been doing HEP which is going okay.   Pt accompanied by: family memberdaughter  PERTINENT HISTORY: Rectal CA, chiari malformation s/p surgery, CKD IV, depression, DM, HTN,   PAIN:  Are you having pain? No  PRECAUTIONS: Fall  RED FLAGS: None   WEIGHT BEARING RESTRICTIONS: No  FALLS: Has patient fallen in last 6 months? Yes. Number of falls 1  LIVING ENVIRONMENT: Lives with: lives alone and has a PCA and daughter that stays with her on nights  Lives in: House/apartment Stairs: 2 steps to enter ; split level home; bedroom on 2nd floor Has following equipment at home: Single point cane, Shower bench, and Grab bars  PLOF: Independent with basic ADLs and Leisure: crafts; PCA helps with more vigorous chores   PATIENT GOALS: get back to normal as much as possible   OBJECTIVE:    TODAY'S TREATMENT: 12/06/23 Activity Comments  STS 10x Without UE support. Improved ability to stand upright with increased reps   STS standing on airex10x Cueing to scoot forward and tuck feet under to avoid LEs pushing back against mat. CGA for safety   heel/toe raise  gastroc stretch 30 each  staggered ant/pos wt shift alt toe tap under sink  alt backwards steps backwards walking At counter for safety  Cueing for form an avoiding compensations. Pt demo great amplitude with B UE  support wit wt shifts, harder to maintain with 1 UE support d/t imbalance. Able to wean to no UE support w/ toe taps.  Tendency for step-to pattern with backwards walking   Nustep L3 x 4 min UEs/LEs For endurance. Pt reports good workout for the R arm               HOME EXERCISE PROGRAM Last updated: 12/06/23 Access Code: [email or web address] URL: [email or web address] Date: 12/06/2023 Prepared by: [facility] - Outpatient  Rehab - [facility]  Program Notes perform standing exercises at counter for safety  Exercises - Seated Long Arc Quad  - 1 x daily - 7 x weekly - 3 sets - 10 reps - Seated March  - 1 x daily - 7 x weekly - 3 sets - 10 reps - Seated Ankle Dorsiflexion AROM  - 1 x daily - 7 x weekly - 3 sets - 10 reps - Heel Toe Raises with Counter Support  - 1 x daily - 5 x weekly - 2 sets - 10 reps - Standing Toe Taps  - 1 x daily - 5 x weekly - 2 sets - 10 reps  PATIENT EDUCATION: Education details: HEP update and edu for safety  Person educated: Patient Education method: Explanation, Demonstration, and Handouts Education comprehension: verbalized understanding and returned demonstration   ------------------------------------- Note: Objective measures were completed at Evaluation unless otherwise noted.  DIAGNOSTIC FINDINGS: none recent  COGNITION: Overall cognitive status: Within functional limits for tasks assessed   SENSATION: Pt reports R>L hand numbness   COORDINATION: Alternating pronation/supination: slowed B Alternating toe tap: WNL B Finger to nose: slight dysmetria R>L   MUSCLE TONE: WNL B LEs  POSTURE: rounded shoulders and forward head  LOWER EXTREMITY ROM:     Active  Right Eval Left Eval  Hip flexion    Hip extension    Hip abduction    Hip adduction    Hip internal rotation    Hip external rotation    Knee flexion    Knee extension    Ankle dorsiflexion 15 10  Ankle plantarflexion    Ankle inversion    Ankle eversion      (Blank rows = not tested)  LOWER EXTREMITY MMT:    MMT  Right Eval Left Eval  Hip flexion 4 4+  Hip extension    Hip abduction 4+ 4+  Hip adduction 4 4  Hip internal rotation    Hip external rotation    Knee flexion 4 4  Knee extension 4 4  Ankle dorsiflexion 3+ 4  Ankle plantarflexion 4 4  Ankle inversion    Ankle eversion    (Blank rows = not tested)  GAIT: Findings: Assistive device utilized:Single point cane, Level of assistance: SBA, and Comments: slowed, foot flat B and shuffling gait   FUNCTIONAL TESTS:  5 times sit to stand: 22.04 sec without standing fully and pushing off knees 10 meter walk test: 22.59 sec (1.45 ft/sec)                                                                                                                                TREATMENT DATE: 11/23/23    PATIENT EDUCATION: Education details: edu on benefits of OT and ST for her complaints- pt agreeable to this; prognosis, POC, exam findings as they relate to functional impairments  Person educated: Patient and daughter Education method: Explanation Education comprehension: verbalized understanding  HOME EXERCISE PROGRAM: Not yet initiated  GOALS: Goals reviewed with patient? Yes  SHORT TERM GOALS: Target date: 12/21/2023  Patient to be independent with initial HEP. Baseline: HEP initiated Goal status: INITIAL    LONG TERM GOALS: Target date: 01/25/24  Patient to be independent with advanced HEP. Baseline: Not yet initiated  Goal status: INITIAL  Patient to demonstrate B LE strength >/=4+/5.  Baseline: See above Goal status: INITIAL  Patient to demonstrate 30% improvement in foot clearance with gait.  Baseline: foot flat B Goal status: INITIAL  Patient to demonstrate gait speed of at least 2 ft/sec in order to improve falls risk.  Baseline: 1.45 ft/sec Goal status: INITIAL  Patient to demonstrate 5xSTS test in <15 sec in order to decrease risk of falls.  Baseline:  22.05 Goal status: INITIAL   ASSESSMENT:  CLINICAL IMPRESSION: Patient arrived to session without complaints. Session focused on progression of balance challenges with transfers and progression of standing dynamic balance challenge. Pt with some difficulty weaning UE support with wt shifting activities d/t imbalance. Required short sitting rest breaks in between standing tasks d/t fatigue. Updated HEP with exercises thay were performed safely today. Patient tolerated session well and without complaints upon leaving.    OBJECTIVE IMPAIRMENTS: Abnormal gait, decreased activity tolerance, decreased balance, decreased coordination, decreased endurance, difficulty walking, decreased strength, postural dysfunction, and pain.   ACTIVITY LIMITATIONS: carrying, lifting, bending, sitting, standing, squatting, sleeping, stairs, transfers, bed mobility, bathing, toileting, dressing, reach over head, hygiene/grooming, and locomotion level  PARTICIPATION LIMITATIONS: meal prep, shopping, community activity, and church  PERSONAL FACTORS: Age, Past/current  experiences, Time since onset of injury/illness/exacerbation, and 3+ comorbidities: Rectal CA, chiari malformation s/p surgery, CKD IV, depression, DM, HTN,  are also affecting patient's functional outcome.   REHAB POTENTIAL: Good  CLINICAL DECISION MAKING: Evolving/moderate complexity  EVALUATION COMPLEXITY: Moderate  PLAN:  PT FREQUENCY: 2x/week  PT DURATION: 8 weeks  PLANNED INTERVENTIONS: 97110-Therapeutic exercises, 97530- Therapeutic activity, [id number]- Neuromuscular re-education, 97535- Self Care, [id number]- Manual therapy, [phone]- Gait training, [phone]- Orthotic Initial, [phone]- Canalith repositioning, [phone]- Aquatic Therapy, [phone]- Electrical stimulation (manual), Patient/Family education, Balance training, Stair training, Taping, Vestibular training, Cryotherapy, and Moist heat  PLAN FOR NEXT SESSION: Review and progress HEP for RLE strength,  balance, work on speed of movement, transfers, foot clearance   Louana Terrilyn Christians, PT, DPT 12/06/23 2:52 PM  [facility] at [facility] [address] [place], [place] [address] Phone # [phone] Fax # [phone]

## 2023-12-06 ENCOUNTER — Ambulatory Visit: Admitting: Physical Therapy

## 2023-12-06 ENCOUNTER — Encounter: Payer: Self-pay | Admitting: Physical Therapy

## 2023-12-06 DIAGNOSIS — R2689 Other abnormalities of gait and mobility: Secondary | ICD-10-CM | POA: Diagnosis not present

## 2023-12-06 DIAGNOSIS — M6281 Muscle weakness (generalized): Secondary | ICD-10-CM | POA: Diagnosis not present

## 2023-12-06 DIAGNOSIS — I69351 Hemiplegia and hemiparesis following cerebral infarction affecting right dominant side: Secondary | ICD-10-CM | POA: Diagnosis not present

## 2023-12-06 DIAGNOSIS — R278 Other lack of coordination: Secondary | ICD-10-CM | POA: Diagnosis not present

## 2023-12-06 DIAGNOSIS — R2681 Unsteadiness on feet: Secondary | ICD-10-CM

## 2023-12-06 DIAGNOSIS — R4701 Aphasia: Secondary | ICD-10-CM | POA: Diagnosis not present

## 2023-12-06 LAB — KAPPA/LAMBDA LIGHT CHAINS
Kappa free light chain: 86.6 mg/L — ABNORMAL HIGH (ref 3.3–19.4)
Kappa, lambda light chain ratio: 2.43 — ABNORMAL HIGH (ref 0.26–1.65)
Lambda free light chains: 35.6 mg/L — ABNORMAL HIGH (ref 5.7–26.3)

## 2023-12-10 LAB — MULTIPLE MYELOMA PANEL, SERUM
Albumin SerPl Elph-Mcnc: 3.6 g/dL (ref 2.9–4.4)
Albumin/Glob SerPl: 1.1 (ref 0.7–1.7)
Alpha 1: 0.2 g/dL (ref 0.0–0.4)
Alpha2 Glob SerPl Elph-Mcnc: 0.9 g/dL (ref 0.4–1.0)
B-Globulin SerPl Elph-Mcnc: 1.1 g/dL (ref 0.7–1.3)
Gamma Glob SerPl Elph-Mcnc: 1.2 g/dL (ref 0.4–1.8)
Globulin, Total: 3.4 g/dL (ref 2.2–3.9)
IgA: 327 mg/dL (ref 64–422)
IgG (Immunoglobin G), Serum: 1635 mg/dL — ABNORMAL HIGH (ref 586–1602)
IgM (Immunoglobulin M), Srm: 28 mg/dL (ref 26–217)
M Protein SerPl Elph-Mcnc: 0.3 g/dL — ABNORMAL HIGH
Total Protein ELP: 7 g/dL (ref 6.0–8.5)

## 2023-12-11 ENCOUNTER — Ambulatory Visit: Admitting: Physical Therapy

## 2023-12-12 ENCOUNTER — Inpatient Hospital Stay (HOSPITAL_BASED_OUTPATIENT_CLINIC_OR_DEPARTMENT_OTHER): Payer: Medicare PPO | Admitting: Hematology

## 2023-12-12 DIAGNOSIS — D472 Monoclonal gammopathy: Secondary | ICD-10-CM | POA: Diagnosis not present

## 2023-12-12 DIAGNOSIS — Z87891 Personal history of nicotine dependence: Secondary | ICD-10-CM | POA: Diagnosis not present

## 2023-12-12 DIAGNOSIS — R7989 Other specified abnormal findings of blood chemistry: Secondary | ICD-10-CM | POA: Diagnosis not present

## 2023-12-12 DIAGNOSIS — N184 Chronic kidney disease, stage 4 (severe): Secondary | ICD-10-CM | POA: Diagnosis not present

## 2023-12-12 DIAGNOSIS — Z85048 Personal history of other malignant neoplasm of rectum, rectosigmoid junction, and anus: Secondary | ICD-10-CM | POA: Diagnosis not present

## 2023-12-12 DIAGNOSIS — D649 Anemia, unspecified: Secondary | ICD-10-CM | POA: Diagnosis not present

## 2023-12-12 NOTE — Progress Notes (Signed)
 HEMATOLOGY ONCOLOGY PROGRESS NOTE  Date of service: 12/12/2023  Patient Care Team: Rollene Almarie LABOR, MD as PCP - General (Internal Medicine) Octavia Charleston, MD as Consulting Physician (Ophthalmology)  CHIEF COMPLAINT/PURPOSE OF CONSULTATION: Follow-up for continued evaluation and management of MGUS  HISTORY OF PRESENTING ILLNESS: (04/29/2020) Lynn Ray is a wonderful 89 y.o. female who has been referred to us  by Dr. Marlee at [facility] for evaluation and management of positive m-spike on SPEP.The pt reports that she is doing well overall. We are joined today by her husband.   The pt reports that these labs were done routinely as she was transferring care to a different doctor. She was previously seeing a doctor at [facility] prior to switching to Dr. Elsa. The pt notes that she started seeing a Nephrologist years ago but the numbers were very low. The first time she saw one was five years ago, but did not provide her with what the cause of her kidney disease was from. The pt notes that she did not receive a lot of information at [facility], one of the reasons she switched. The last few years, the pt has just been doing blood tests and not urine tests. The pt has had hypertension for a very long time, over ten years, and was diagnoses as pre-diabetic years ago as well. The pt has not been on any medications for diabetes. She is currently only on medication for hypothyroidism and hypertension. The pt notes that she had Grave's disease in the 1980s and had the radioactive iodine. The pt notes that she had surgery for rectal cancer in 2004, but did not need chemo or radiation. The pt has never had much protein in the urine, and her creatinine changed from 1.4 in 2016 to 1.8 just recently last month. The pt had an US  Kidney that showed the right kidney was slightly smaller in size. The pt notes that Dr. Marlee believes the CKD is due to aging and chronic kidney disease and not needing immediate  intervention.   Lab results 04/05/2020 of SPEP was all WNL except m-spike of 0.3.  04/05/2020 Free Kappa of 69.2, Free Lambda of 26.7, Kappa Lambda ratio of 2.59.   On review of systems, pt reports anxiety and denies acute bone pains, fevers, chills, back pain, abdominal pain, and any other symptoms.  INTERVAL HISTORY: I connected with Lynn Ray on 12/12/2023 at  3:30 PM EST by telephone and verified that I am speaking with the correct person using two identifiers.   I discussed the limitations, risks, security and privacy concerns of performing an evaluation and management service by telemedicine and the availability of in-person appointments. I also discussed with the patient that there may be a patient responsible charge related to this service. The patient expressed understanding and agreed to proceed.   Other persons participating in the visit and their role in the encounter: Medical Scribes, Damien Blanks and Marijo Sharps.  Patient's location: Home Provider's location: [facility]   Chief Complaint: continued evaluation and management of MGUS  She was last seen by me on 12/11/2022; at the time she mentioned experiencing hip pain and back pain due to arthritis, as well as frequent urination.   Today, she says that she is doing well. I reviewed her labs from 12/05/2023 with her, which were all stable overall. Denies any new bone pains.  She notes having had a stroke in January of 2025, with some persistent issues with verbalizing, mobility, and writing. Issues have improved with  physical therapy.  REVIEW OF SYSTEMS:   10 Point review of systems of done and is negative except as noted above.  MEDICAL HISTORY Past Medical History:  Diagnosis Date   Arthritis    Cancer [facility]) 2004   rectal cancer   Cataract    right eye   Chiari malformation    CKD (chronic kidney disease), stage IV (HCC)    Depression    Diabetes mellitus (HCC)    pre   Hypertension    Sleep apnea    no CPAP    Thyroid  disease    hyporthyroid    SURGICAL HISTORY Past Surgical History:  Procedure Laterality Date   CATARACT EXTRACTION Bilateral    chiari malformation  2013   surgery   COLONOSCOPY     FLEXIBLE SIGMOIDOSCOPY     POLYPECTOMY      SOCIAL HISTORY Social History   Tobacco Use   Smoking status: Former    Current packs/day: 0.25    Types: Cigarettes   Smokeless tobacco: Never   Tobacco comments:    04/2013,quit  Vaping Use   Vaping status: Never Used  Substance Use Topics   Alcohol use: No    Alcohol/week: 0.0 standard drinks of alcohol   Drug use: No    Social History   Social History Narrative   Patient consumes one cup of coffee daily   Right handed   Caffeine use: coffee daily, tea sometimes.    Lives alone/at night she has a equities trader    SOCIAL DRIVERS OF HEALTH SDOH Screenings   Food Insecurity: No Food Insecurity (10/20/2023)  Housing: Low Risk  (10/20/2023)  Transportation Needs: No Transportation Needs (10/20/2023)  Utilities: Not At Risk (08/01/2023)  Alcohol Screen: Low Risk  (08/01/2023)  Depression (PHQ2-9): Low Risk  (10/05/2023)  Financial Resource Strain: Low Risk  (10/20/2023)  Physical Activity: Inactive (10/20/2023)  Social Connections: Moderately Integrated (10/20/2023)  Stress: No Stress Concern Present (10/20/2023)  Tobacco Use: Medium Risk (12/06/2023)  Health Literacy: Adequate Health Literacy (08/01/2023)     FAMILY HISTORY Family History  Problem Relation Age of Onset   Alzheimer's disease Mother    Stroke Mother        mini   Heart attack Mother    Hypertension Mother    Depression Other    Anemia Other    Obesity Other    Colon cancer Neg Hx    Esophageal cancer Neg Hx    Stomach cancer Neg Hx      ALLERGIES: is allergic to aspirin  and tape.  MEDICATIONS  Current Outpatient Medications  Medication Sig Dispense Refill   acetaminophen  (TYLENOL ) 325 MG tablet Take 1-2 tablets (325-650 mg total) by mouth every 4 (four)  hours as needed for mild pain (pain score 1-3).     amLODipine  (NORVASC ) 2.5 MG tablet Take 1 tablet (2.5 mg total) by mouth daily. 90 tablet 3   aspirin  EC 81 MG tablet Take 1 tablet (81 mg total) by mouth daily.     atorvastatin  (LIPITOR) 40 MG tablet Take 1 tablet (40 mg total) by mouth daily. 90 tablet 3   benzonatate  (TESSALON ) 100 MG capsule Take 1 capsule (100 mg total) by mouth every 8 (eight) hours. 21 capsule 0   Carboxymethylcellulose Sodium ([employer] LUBRICANT EYE DROPS OP) Apply 2 drops to eye 2 (two) times daily as needed. (Patient not taking: Reported on 11/23/2023)     cholecalciferol (VITAMIN D3) 25 MCG (1000 UT) tablet Take 1,000 Units by mouth daily.  levothyroxine  (SYNTHROID ) 100 MCG tablet Take 1 tablet (100 mcg total) by mouth daily. 90 tablet 3   vitamin B-12 (CYANOCOBALAMIN ) 100 MCG tablet Take 100 mcg by mouth daily.     No current facility-administered medications for this visit.    PHYSICAL EXAMINATION TELEPHONE VISIT:   LABORATORY DATA:   I have reviewed the data as listed     Latest Ref Rng & Units 12/05/2023    1:59 PM 10/05/2023   11:33 AM 07/03/2023    8:17 PM  CBC EXTENDED  WBC 4.0 - 10.5 K/uL 6.2  6.4  4.6   RBC 3.87 - 5.11 MIL/uL 3.70  3.73  3.34   Hemoglobin 12.0 - 15.0 g/dL 89.5  89.5  9.3   HCT 63.9 - 46.0 % 32.6  32.7  30.1   Platelets 150 - 400 K/uL 251  250.0  244   NEUT# 1.7 - 7.7 K/uL 3.5     Lymph# 0.7 - 4.0 K/uL 1.7          Latest Ref Rng & Units 12/05/2023    1:59 PM 10/05/2023   11:33 AM 07/03/2023    8:17 PM  CMP  Glucose 70 - 99 mg/dL 899  91  895   BUN 8 - 23 mg/dL 25  21  17    Creatinine 0.44 - 1.00 mg/dL 7.75  7.93  7.86   Sodium 135 - 145 mmol/L 140  139  139   Potassium 3.5 - 5.1 mmol/L 4.3  4.6  4.2   Chloride 98 - 111 mmol/L 111  108  105   CO2 22 - 32 mmol/L 24  25  22    Calcium  8.9 - 10.3 mg/dL 9.8  9.8  9.9   Total Protein 6.5 - 8.1 g/dL 7.3  7.3    Total Bilirubin 0.0 - 1.2 mg/dL 0.3  0.3    Alkaline Phos 38 - 126 U/L  118  177    AST 15 - 41 U/L 17  30    ALT 0 - 44 U/L 10  23     MULTIPLE MYELOMA & KAPPA/LAMBDA LIGHT CHAINS 03/2020 - 12/2023  Immunofixation shows IgG monoclonal protein with kappa light chain specificity.  PLEASE NOTE:  Samples from patients receiving DARZALEX(R) (daratumumab) or  SARCLISA(R)(isatuximab-irfc) treatment can appear as an  IgG kappa and mask a complete response (CR). If this patient  is receiving these therapies, this IFE assay interference  can be removed by ordering test number [id number]-Immunofixation,  Daratumumab-Specific, Serum or 123062-Immunofixation,  Isatuximab-Specific, Serum and submitting a new sample for  testing or by calling the lab to add this test to the current  sample.    RADIOGRAPHIC STUDIES: I have personally reviewed the radiological images as listed and agreed with the findings in the report. No results found.   ASSESSMENT & PLAN:  89 y.o. 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 Arthritic changes in right and left shoulder joints.  PLAN: - Discussed lab results on 12/05/2023 in detail with patient: CBC showed WBC of 6.2K, Hemoglobin of 10.4 has not changed, and PLTs of 251K.  Mildly anemic, likely related to her CKD. CMP with Creatinine 2.24 increased from 2.06 and Glucose 100.  Stable CKD.  M protein stable at 0.3 Kappa/Lambda Lights Chains have remained stable Informed patient that her MGUS is still considered low-risk and as it has been stable for several years, her PCP can take  over monitoring her labs yearly  FOLLOW-UP  RTC with PCP  The total time spent in the appointment was 20 minutes* .  All of the patient's questions were answered and the patient knows to call the clinic with any problems, questions, or concerns.  Emaline Saran MD MS AAHIVMS [facility] Hematology/Oncology Physician [facility]  *Total Encounter Time as  defined by the Centers for Medicare and Medicaid Services includes, in addition to the face-to-face time of a patient visit (documented in the note above) non-face-to-face time: obtaining and reviewing outside history, ordering and reviewing medications, tests or procedures, care coordination (communications with other health care professionals or caregivers) and documentation in the medical record.  I,Emily Lagle,acting as a neurosurgeon for Emaline Saran, MD.,have documented all relevant documentation on the behalf of Emaline Saran, MD,as directed by  Emaline Saran, MD while in the presence of Emaline Saran, MD.  I have reviewed the above documentation for accuracy and completeness, and I agree with the above.  Tymothy Cass, MD

## 2023-12-12 NOTE — Therapy (Signed)
 OUTPATIENT PHYSICAL THERAPY NEURO TREATMENT NOTE   Patient Name: Lynn Ray MRN: [id number] DOB:[birth year], 89 y.o., Lynn Today's Date: 12/13/2023   PCP: Rollene Almarie LABOR, MD  REFERRING PROVIDER: Rollene Almarie LABOR, MD   END OF SESSION:  PT End of Session - 12/13/23 1442     Visit Number 4    Number of Visits 17    Date for Recertification  01/25/24    Authorization Type [employer]    Authorization Time Period 8 visits 10/24-12/26    PT Start Time 1401    PT Stop Time 1445    PT Time Calculation (min) 44 min    Equipment Utilized During Treatment Gait belt    Activity Tolerance Patient tolerated treatment well;Patient limited by fatigue    Behavior During Therapy WFL for tasks assessed/performed             Past Medical History:  Diagnosis Date   Arthritis    Cancer (HCC) 2004   rectal cancer   Cataract    right eye   Chiari malformation    CKD (chronic kidney disease), stage IV (HCC)    Depression    Diabetes mellitus (HCC)    pre   Hypertension    Sleep apnea    no CPAP   Thyroid  disease    hyporthyroid   Past Surgical History:  Procedure Laterality Date   CATARACT EXTRACTION Bilateral    chiari malformation  2013   surgery   COLONOSCOPY     FLEXIBLE SIGMOIDOSCOPY     POLYPECTOMY     Patient Active Problem List   Diagnosis Date Noted   Sinus pause 07/06/2023   Aphasia, late effect of cerebrovascular disease 04/27/2023   Hx of completed stroke 03/16/2023   Anemia 02/23/2023   Vision changes 04/18/2022   Pre-diabetes 09/29/2020   Stage 4 chronic kidney disease (HCC) 12/22/2019   History of Chiari malformation 10/09/2017   Chronic right shoulder pain 10/09/2017   Routine general medical examination at a health care facility 07/13/2017   History of malignant neoplasm of large intestine 11/18/2010   BARRETT'S ESOPHAGUS 01/14/2008   Hypothyroidism 12/10/2007   Morbid obesity (HCC) 12/10/2007   Essential hypertension  12/10/2007   AORTIC VALVE DISORDERS 12/10/2007   COPD 12/10/2007   GERD 12/09/2007    ONSET DATE: 02/22/23  REFERRING DIAG: S13.26 (ICD-10-CM) - Hx of completed stroke Rollene Almarie LABOR, MD  THERAPY DIAG:  Other abnormalities of gait and mobility  Unsteadiness on feet  Muscle weakness (generalized)  Rationale for Evaluation and Treatment: Rehabilitation  SUBJECTIVE:  SUBJECTIVE STATEMENT: My knees have been hurting since the weather turned cold. Used some Voltaren  and that helped.   Pt accompanied by: family memberdaughter  PERTINENT HISTORY: Rectal CA, chiari malformation s/p surgery, CKD IV, depression, DM, HTN,   PAIN:  Are you having pain? No (reports 7/10 in knees when climbing steps)  PRECAUTIONS: Fall  RED FLAGS: None   WEIGHT BEARING RESTRICTIONS: No  FALLS: Has patient fallen in last 6 months? Yes. Number of falls 1  LIVING ENVIRONMENT: Lives with: lives alone and has a PCA and daughter that stays with her on nights  Lives in: House/apartment Stairs: 2 steps to enter ; split level home; bedroom on 2nd floor Has following equipment at home: Single point cane, Shower bench, and Grab bars  PLOF: Independent with basic ADLs and Leisure: crafts; PCA helps with more vigorous chores   PATIENT GOALS: get back to normal as much as possible   OBJECTIVE:     TODAY'S TREATMENT: 12/13/23 Activity Comments  fwd/back stepping fwd/back stepping on foam  sidestepping over hurdle SLS + rolling ball under foot At TM rail for safety; cueing to reduce UE support but pt with marked imbalance and does require 1 UE support with most activities. Some circumduction stepping over obstacles, heavy reliance on UEs for ball rolls and pt with some difficulty coordinating circles   L 6 step  ups 10x Weaned to 1 UE support; some weakness evident, cueing to remember sequencing   alt step up + opposite SKTC B UE support; harder on L LE  Nustep L5 x 5 min UEs/LEs For aerobic exercise             PATIENT EDUCATION: Education details: HEP update with edu for safety Person educated: Patient Education method: Explanation, Demonstration, Tactile cues, Verbal cues, and Handouts Education comprehension: verbalized understanding and returned demonstration   HOME EXERCISE PROGRAM Access Code: [email or web address] URL: [email or web address] Date: 12/13/2023 Prepared by: [facility] - Outpatient  Rehab - [facility]  Program Notes perform standing exercises at counter for safety  Exercises - Seated Long Arc Quad  - 1 x daily - 7 x weekly - 3 sets - 10 reps - Seated March  - 1 x daily - 7 x weekly - 3 sets - 10 reps - Seated Ankle Dorsiflexion AROM  - 1 x daily - 7 x weekly - 3 sets - 10 reps - Heel Toe Raises with Counter Support  - 1 x daily - 5 x weekly - 2 sets - 10 reps - Standing Toe Taps  - 1 x daily - 5 x weekly - 2 sets - 10 reps - Alternating Step Forward with Support  - 1 x daily - 5 x weekly - 2 sets - 10 reps    ------------------------------------- Note: Objective measures were completed at Evaluation unless otherwise noted.  DIAGNOSTIC FINDINGS: none recent  COGNITION: Overall cognitive status: Within functional limits for tasks assessed   SENSATION: Pt reports R>L hand numbness   COORDINATION: Alternating pronation/supination: slowed B Alternating toe tap: WNL B Finger to nose: slight dysmetria R>L   MUSCLE TONE: WNL B LEs  POSTURE: rounded shoulders and forward head  LOWER EXTREMITY ROM:     Active  Right Eval Left Eval  Hip flexion    Hip extension    Hip abduction    Hip adduction    Hip internal rotation    Hip external rotation    Knee flexion    Knee extension    Ankle  dorsiflexion 15 10  Ankle plantarflexion    Ankle  inversion    Ankle eversion     (Blank rows = not tested)  LOWER EXTREMITY MMT:    MMT  Right Eval Left Eval  Hip flexion 4 4+  Hip extension    Hip abduction 4+ 4+  Hip adduction 4 4  Hip internal rotation    Hip external rotation    Knee flexion 4 4  Knee extension 4 4  Ankle dorsiflexion 3+ 4  Ankle plantarflexion 4 4  Ankle inversion    Ankle eversion    (Blank rows = not tested)  GAIT: Findings: Assistive device utilized:Single point cane, Level of assistance: SBA, and Comments: slowed, foot flat B and shuffling gait   FUNCTIONAL TESTS:  5 times sit to stand: 22.04 sec without standing fully and pushing off knees 10 meter walk test: 22.59 sec (1.45 ft/sec)                                                                                                                                TREATMENT DATE: 11/23/23    PATIENT EDUCATION: Education details: edu on benefits of OT and ST for her complaints- pt agreeable to this; prognosis, POC, exam findings as they relate to functional impairments  Person educated: Patient and daughter Education method: Explanation Education comprehension: verbalized understanding  HOME EXERCISE PROGRAM: Not yet initiated  GOALS: Goals reviewed with patient? Yes  SHORT TERM GOALS: Target date: 12/21/2023  Patient to be independent with initial HEP. Baseline: HEP initiated Goal status: IN PROGRESS    LONG TERM GOALS: Target date: 01/25/24  Patient to be independent with advanced HEP. Baseline: Not yet initiated  Goal status: IN PROGRESS  Patient to demonstrate B LE strength >/=4+/5.  Baseline: See above Goal status: IN PROGRESS  Patient to demonstrate 30% improvement in foot clearance with gait.  Baseline: foot flat B Goal status: IN PROGRESS  Patient to demonstrate gait speed of at least 2 ft/sec in order to improve falls risk.  Baseline: 1.45 ft/sec Goal status: IN PROGRESS  Patient to demonstrate 5xSTS test in <15  sec in order to decrease risk of falls.  Baseline: 22.05 Goal status: INITIAL   ASSESSMENT:  CLINICAL IMPRESSION: Patient arrived to session with report of increased B knee pain since the colder weather. Session focused on dynamic balance challenges; patient does require single UE support for most activities. Some compensations evident with stepping over obstacles and difficulty stabilizing in SLS even with UE support. Pt required frequent short rest breaks in between standing activities d/t fatigue. Patient tolerated session well and without complaints at end of appointment.   OBJECTIVE IMPAIRMENTS: Abnormal gait, decreased activity tolerance, decreased balance, decreased coordination, decreased endurance, difficulty walking, decreased strength, postural dysfunction, and pain.   ACTIVITY LIMITATIONS: carrying, lifting, bending, sitting, standing, squatting, sleeping, stairs, transfers, bed mobility, bathing, toileting, dressing, reach over head, hygiene/grooming, and locomotion level  PARTICIPATION  LIMITATIONS: meal prep, shopping, community activity, and church  PERSONAL FACTORS: Age, Past/current experiences, Time since onset of injury/illness/exacerbation, and 3+ comorbidities: Rectal CA, chiari malformation s/p surgery, CKD IV, depression, DM, HTN,  are also affecting patient's functional outcome.   REHAB POTENTIAL: Good  CLINICAL DECISION MAKING: Evolving/moderate complexity  EVALUATION COMPLEXITY: Moderate  PLAN:  PT FREQUENCY: 2x/week  PT DURATION: 8 weeks  PLANNED INTERVENTIONS: 97110-Therapeutic exercises, 97530- Therapeutic activity, [id number]- Neuromuscular re-education, 97535- Self Care, 02859- Manual therapy, [phone]- Gait training, [phone]- Orthotic Initial, [phone]- Canalith repositioning, [phone]- Aquatic Therapy, [phone]- Electrical stimulation (manual), Patient/Family education, Balance training, Stair training, Taping, Vestibular training, Cryotherapy, and Moist heat  PLAN FOR  NEXT SESSION: Review and progress HEP for RLE strength, balance, work on speed of movement, transfers, foot clearance   Louana Terrilyn Christians, PT, DPT 12/13/23 2:45 PM  [facility] [address], [address] [place], [place] [address] Phone # [phone] Fax # [phone]

## 2023-12-13 ENCOUNTER — Ambulatory Visit: Admitting: Physical Therapy

## 2023-12-13 ENCOUNTER — Encounter: Payer: Self-pay | Admitting: Physical Therapy

## 2023-12-13 DIAGNOSIS — M6281 Muscle weakness (generalized): Secondary | ICD-10-CM

## 2023-12-13 DIAGNOSIS — R2689 Other abnormalities of gait and mobility: Secondary | ICD-10-CM | POA: Diagnosis not present

## 2023-12-13 DIAGNOSIS — I69351 Hemiplegia and hemiparesis following cerebral infarction affecting right dominant side: Secondary | ICD-10-CM | POA: Diagnosis not present

## 2023-12-13 DIAGNOSIS — R4701 Aphasia: Secondary | ICD-10-CM | POA: Diagnosis not present

## 2023-12-13 DIAGNOSIS — R2681 Unsteadiness on feet: Secondary | ICD-10-CM | POA: Diagnosis not present

## 2023-12-13 DIAGNOSIS — R278 Other lack of coordination: Secondary | ICD-10-CM | POA: Diagnosis not present

## 2023-12-18 ENCOUNTER — Ambulatory Visit: Admitting: Physical Therapy

## 2023-12-18 ENCOUNTER — Encounter: Payer: Self-pay | Admitting: Physical Therapy

## 2023-12-18 DIAGNOSIS — R2689 Other abnormalities of gait and mobility: Secondary | ICD-10-CM

## 2023-12-18 DIAGNOSIS — R4701 Aphasia: Secondary | ICD-10-CM | POA: Diagnosis not present

## 2023-12-18 DIAGNOSIS — M6281 Muscle weakness (generalized): Secondary | ICD-10-CM

## 2023-12-18 DIAGNOSIS — R278 Other lack of coordination: Secondary | ICD-10-CM | POA: Diagnosis not present

## 2023-12-18 DIAGNOSIS — R2681 Unsteadiness on feet: Secondary | ICD-10-CM

## 2023-12-18 DIAGNOSIS — I69351 Hemiplegia and hemiparesis following cerebral infarction affecting right dominant side: Secondary | ICD-10-CM | POA: Diagnosis not present

## 2023-12-18 NOTE — Therapy (Signed)
 OUTPATIENT PHYSICAL THERAPY NEURO TREATMENT NOTE   Patient Name: Lynn Ray MRN: [id number] DOB:02-21-1934, 89 y.o., female Today's Date: 12/18/2023   PCP: Rollene Almarie LABOR, MD  REFERRING PROVIDER: Rollene Almarie LABOR, MD   END OF SESSION:  PT End of Session - 12/18/23 1406     Visit Number 5    Number of Visits 17    Date for Recertification  01/25/24    Authorization Type [employer]    Authorization Time Period 8 visits 10/24-12/26    Authorization - Visit Number 5    Authorization - Number of Visits 8    PT Start Time 1406    PT Stop Time 1445    PT Time Calculation (min) 39 min    Equipment Utilized During Treatment Gait belt    Activity Tolerance Patient tolerated treatment well    Behavior During Therapy WFL for tasks assessed/performed              Past Medical History:  Diagnosis Date   Arthritis    Cancer (HCC) 2004   rectal cancer   Cataract    right eye   Chiari malformation    CKD (chronic kidney disease), stage IV (HCC)    Depression    Diabetes mellitus (HCC)    pre   Hypertension    Sleep apnea    no CPAP   Thyroid  disease    hyporthyroid   Past Surgical History:  Procedure Laterality Date   CATARACT EXTRACTION Bilateral    chiari malformation  2013   surgery   COLONOSCOPY     FLEXIBLE SIGMOIDOSCOPY     POLYPECTOMY     Patient Active Problem List   Diagnosis Date Noted   Sinus pause 07/06/2023   Aphasia, late effect of cerebrovascular disease 04/27/2023   Hx of completed stroke 03/16/2023   Anemia 02/23/2023   Vision changes 04/18/2022   Pre-diabetes 09/29/2020   Stage 4 chronic kidney disease (HCC) 12/22/2019   History of Chiari malformation 10/09/2017   Chronic right shoulder pain 10/09/2017   Routine general medical examination at a health care facility 07/13/2017   History of malignant neoplasm of large intestine 11/18/2010   BARRETT'S ESOPHAGUS 01/14/2008   Hypothyroidism 12/10/2007   Morbid obesity  (HCC) 12/10/2007   Essential hypertension 12/10/2007   AORTIC VALVE DISORDERS 12/10/2007   COPD 12/10/2007   GERD 12/09/2007    ONSET DATE: 02/22/23  REFERRING DIAG: S13.26 (ICD-10-CM) - Hx of completed stroke Rollene Almarie LABOR, MD  THERAPY DIAG:  Other abnormalities of gait and mobility  Unsteadiness on feet  Muscle weakness (generalized)  Rationale for Evaluation and Treatment: Rehabilitation  SUBJECTIVE:  SUBJECTIVE STATEMENT: Doing pretty good.    Pt accompanied by: family memberdaughter  PERTINENT HISTORY: Rectal CA, chiari malformation s/p surgery, CKD IV, depression, DM, HTN,   PAIN:  Are you having pain? No   PRECAUTIONS: Fall  RED FLAGS: None   WEIGHT BEARING RESTRICTIONS: No  FALLS: Has patient fallen in last 6 months? Yes. Number of falls 1  LIVING ENVIRONMENT: Lives with: lives alone and has a PCA and daughter that stays with her on nights  Lives in: House/apartment Stairs: 2 steps to enter ; split level home; bedroom on 2nd floor Has following equipment at home: Single point cane, Shower bench, and Grab bars  PLOF: Independent with basic ADLs and Leisure: crafts; PCA helps with more vigorous chores   PATIENT GOALS: get back to normal as much as possible   OBJECTIVE:    TODAY'S TREATMENT: 12/18/2023 Activity Comments  NuStep, Level 3>4, 4 extremities x 6 minutes   Reviewed HEP additions from last visit Good return demo with UE support  Standing strengthening, BLE Marching in place 2 x 10 Hip abduction 2 x 10 Hip extension 2 x 10 Heel/toe raises x 10 2#  Seated LAQ 2 x 10 BLE Seated march  x 10 2#  Forward step ups, RLE leading x 10 reps BUE support  Gait with attention to heelstrike With cane and CGA        PATIENT EDUCATION: Education details:  HEP review  Person educated: Patient Education method: Explanation, Demonstration, Tactile cues, Verbal cues, and Handouts Education comprehension: verbalized understanding and returned demonstration   HOME EXERCISE PROGRAM Access Code: [email or web address] URL: [email or web address] Date: 12/13/2023 Prepared by: [facility] - Outpatient  Rehab - [facility]  Program Notes perform standing exercises at counter for safety  Exercises - Seated Long Arc Quad  - 1 x daily - 7 x weekly - 3 sets - 10 reps - Seated March  - 1 x daily - 7 x weekly - 3 sets - 10 reps - Seated Ankle Dorsiflexion AROM  - 1 x daily - 7 x weekly - 3 sets - 10 reps - Heel Toe Raises with Counter Support  - 1 x daily - 5 x weekly - 2 sets - 10 reps - Standing Toe Taps  - 1 x daily - 5 x weekly - 2 sets - 10 reps - Alternating Step Forward with Support  - 1 x daily - 5 x weekly - 2 sets - 10 reps    ------------------------------------- Note: Objective measures were completed at Evaluation unless otherwise noted.  DIAGNOSTIC FINDINGS: none recent  COGNITION: Overall cognitive status: Within functional limits for tasks assessed   SENSATION: Pt reports R>L hand numbness   COORDINATION: Alternating pronation/supination: slowed B Alternating toe tap: WNL B Finger to nose: slight dysmetria R>L   MUSCLE TONE: WNL B LEs  POSTURE: rounded shoulders and forward head  LOWER EXTREMITY ROM:     Active  Right Eval Left Eval  Hip flexion    Hip extension    Hip abduction    Hip adduction    Hip internal rotation    Hip external rotation    Knee flexion    Knee extension    Ankle dorsiflexion 15 10  Ankle plantarflexion    Ankle inversion    Ankle eversion     (Blank rows = not tested)  LOWER EXTREMITY MMT:    MMT  Right Eval Left Eval  Hip flexion 4 4+  Hip extension  Hip abduction 4+ 4+  Hip adduction 4 4  Hip internal rotation    Hip external rotation    Knee flexion 4 4  Knee  extension 4 4  Ankle dorsiflexion 3+ 4  Ankle plantarflexion 4 4  Ankle inversion    Ankle eversion    (Blank rows = not tested)  GAIT: Findings: Assistive device utilized:Single point cane, Level of assistance: SBA, and Comments: slowed, foot flat B and shuffling gait   FUNCTIONAL TESTS:  5 times sit to stand: 22.04 sec without standing fully and pushing off knees 10 meter walk test: 22.59 sec (1.45 ft/sec)                                                                                                                                TREATMENT DATE: 11/23/23    PATIENT EDUCATION: Education details: edu on benefits of OT and ST for her complaints- pt agreeable to this; prognosis, POC, exam findings as they relate to functional impairments  Person educated: Patient and daughter Education method: Explanation Education comprehension: verbalized understanding  HOME EXERCISE PROGRAM: Not yet initiated  GOALS: Goals reviewed with patient? Yes  SHORT TERM GOALS: Target date: 12/21/2023  Patient to be independent with initial HEP. Baseline: HEP initiated Goal status: IN PROGRESS    LONG TERM GOALS: Target date: 01/25/24  Patient to be independent with advanced HEP. Baseline: Not yet initiated  Goal status: IN PROGRESS  Patient to demonstrate B LE strength >/=4+/5.  Baseline: See above Goal status: IN PROGRESS  Patient to demonstrate 30% improvement in foot clearance with gait.  Baseline: foot flat B Goal status: IN PROGRESS  Patient to demonstrate gait speed of at least 2 ft/sec in order to improve falls risk.  Baseline: 1.45 ft/sec Goal status: IN PROGRESS  Patient to demonstrate 5xSTS test in <15 sec in order to decrease risk of falls.  Baseline: 22.05 Goal status: INITIAL   ASSESSMENT:  CLINICAL IMPRESSION: Pt presents today without new complaints. Skilled PT session focused on strengthening and balance exercises. Pt continues to need UE support for standing  balance and strengthening exercises.  She needs cues to lessen extra trunk motion with standing hip strengthening exercises.  With gait, she continues to have decreased foot clearance, and with attention to improved heelstrike and foot clearance, she is slightly more unsteady.  . Pt will continue to benefit from skilled PT towards goals for improved functional mobility and decreased fall risk.   OBJECTIVE IMPAIRMENTS: Abnormal gait, decreased activity tolerance, decreased balance, decreased coordination, decreased endurance, difficulty walking, decreased strength, postural dysfunction, and pain.   ACTIVITY LIMITATIONS: carrying, lifting, bending, sitting, standing, squatting, sleeping, stairs, transfers, bed mobility, bathing, toileting, dressing, reach over head, hygiene/grooming, and locomotion level  PARTICIPATION LIMITATIONS: meal prep, shopping, community activity, and church  PERSONAL FACTORS: Age, Past/current experiences, Time since onset of injury/illness/exacerbation, and 3+ comorbidities: Rectal CA, chiari malformation s/p surgery, CKD IV, depression, DM, HTN,  are also affecting patient's functional outcome.   REHAB POTENTIAL: Good  CLINICAL DECISION MAKING: Evolving/moderate complexity  EVALUATION COMPLEXITY: Moderate  PLAN:  PT FREQUENCY: 2x/week  PT DURATION: 8 weeks  PLANNED INTERVENTIONS: 97110-Therapeutic exercises, 97530- Therapeutic activity, 97112- Neuromuscular re-education, [phone]- Self Care, 02859- Manual therapy, [phone]- Gait training, [phone]- Orthotic Initial, [phone]- Canalith repositioning, [phone]- Aquatic Therapy, [phone]- Electrical stimulation (manual), Patient/Family education, Balance training, Stair training, Taping, Vestibular training, Cryotherapy, and Moist heat  PLAN FOR NEXT SESSION: Review and progress HEP for RLE strength, balance, work on speed of movement, transfers, foot clearance/heelstrike with gait   Greig Anon, PT 12/18/23 2:54 PM Phone:  [phone] Fax: [phone]  [facility] at [facility] [address], [address] [place], [place] [address] Phone # [phone] Fax # [phone]

## 2023-12-20 ENCOUNTER — Encounter: Payer: Self-pay | Admitting: Physical Therapy

## 2023-12-20 ENCOUNTER — Ambulatory Visit: Admitting: Physical Therapy

## 2023-12-20 DIAGNOSIS — M6281 Muscle weakness (generalized): Secondary | ICD-10-CM | POA: Diagnosis not present

## 2023-12-20 DIAGNOSIS — R2681 Unsteadiness on feet: Secondary | ICD-10-CM | POA: Diagnosis not present

## 2023-12-20 DIAGNOSIS — R2689 Other abnormalities of gait and mobility: Secondary | ICD-10-CM

## 2023-12-20 DIAGNOSIS — R278 Other lack of coordination: Secondary | ICD-10-CM | POA: Diagnosis not present

## 2023-12-20 DIAGNOSIS — R4701 Aphasia: Secondary | ICD-10-CM | POA: Diagnosis not present

## 2023-12-20 DIAGNOSIS — I69351 Hemiplegia and hemiparesis following cerebral infarction affecting right dominant side: Secondary | ICD-10-CM | POA: Diagnosis not present

## 2023-12-20 NOTE — Therapy (Signed)
 OUTPATIENT PHYSICAL THERAPY NEURO TREATMENT NOTE   Patient Name: Lynn Ray MRN: [id number] DOB:Jun 05, 1934, 89 y.o., female Today's Date: 12/20/2023   PCP: Rollene Almarie LABOR, MD  REFERRING PROVIDER: Rollene Almarie LABOR, MD   END OF SESSION:  PT End of Session - 12/20/23 1409     Visit Number 6    Number of Visits 17    Date for Recertification  01/25/24    Authorization Type [employer]    Authorization Time Period 8 visits 10/24-12/26    Authorization - Visit Number 6    Authorization - Number of Visits 8    PT Start Time 1406    PT Stop Time 1444    PT Time Calculation (min) 38 min    Equipment Utilized During Treatment Gait belt    Activity Tolerance Patient tolerated treatment well    Behavior During Therapy WFL for tasks assessed/performed              Past Medical History:  Diagnosis Date   Arthritis    Cancer (HCC) 2004   rectal cancer   Cataract    right eye   Chiari malformation    CKD (chronic kidney disease), stage IV (HCC)    Depression    Diabetes mellitus (HCC)    pre   Hypertension    Sleep apnea    no CPAP   Thyroid  disease    hyporthyroid   Past Surgical History:  Procedure Laterality Date   CATARACT EXTRACTION Bilateral    chiari malformation  2013   surgery   COLONOSCOPY     FLEXIBLE SIGMOIDOSCOPY     POLYPECTOMY     Patient Active Problem List   Diagnosis Date Noted   Sinus pause 07/06/2023   Aphasia, late effect of cerebrovascular disease 04/27/2023   Hx of completed stroke 03/16/2023   Anemia 02/23/2023   Vision changes 04/18/2022   Pre-diabetes 09/29/2020   Stage 4 chronic kidney disease (HCC) 12/22/2019   History of Chiari malformation 10/09/2017   Chronic right shoulder pain 10/09/2017   Routine general medical examination at a health care facility 07/13/2017   History of malignant neoplasm of large intestine 11/18/2010   BARRETT'S ESOPHAGUS 01/14/2008   Hypothyroidism 12/10/2007   Morbid obesity  (HCC) 12/10/2007   Essential hypertension 12/10/2007   AORTIC VALVE DISORDERS 12/10/2007   COPD 12/10/2007   GERD 12/09/2007    ONSET DATE: 02/22/23  REFERRING DIAG: S13.26 (ICD-10-CM) - Hx of completed stroke Rollene Almarie LABOR, MD  THERAPY DIAG:  Other abnormalities of gait and mobility  Unsteadiness on feet  Muscle weakness (generalized)  Rationale for Evaluation and Treatment: Rehabilitation  SUBJECTIVE:  SUBJECTIVE STATEMENT: Doing pretty good.    Pt accompanied by: family memberdaughter  PERTINENT HISTORY: Rectal CA, chiari malformation s/p surgery, CKD IV, depression, DM, HTN,   PAIN:  Are you having pain? No   PRECAUTIONS: Fall  RED FLAGS: None   WEIGHT BEARING RESTRICTIONS: No  FALLS: Has patient fallen in last 6 months? Yes. Number of falls 1  LIVING ENVIRONMENT: Lives with: lives alone and has a PCA and daughter that stays with her on nights  Lives in: House/apartment Stairs: 2 steps to enter ; split level home; bedroom on 2nd floor Has following equipment at home: Single point cane, Shower bench, and Grab bars  PLOF: Independent with basic ADLs and Leisure: crafts; PCA helps with more vigorous chores   PATIENT GOALS: get back to normal as much as possible   OBJECTIVE:    TODAY'S TREATMENT: 12/20/2023 Activity Comments  Standing at parallel bars: -Forward/back walking 5 reps -Marching in place  x 10 -Heel/toe raises  x 10 -Stagger stance forward/back rocking x 10 -forward step over obstacle, alternating legs x 10 -side step over obstacle, alternating legs x 10  For step length/heelstrike, foot clearance Decreased eccentric control with plantarflexion  Standing balance feet apart/feet together/stagger stance EO and EC Mild sway  Ankle strengthening:   seated dorsiflexion 2 x 10 Plantarflexion 2 x 10 Red band-cues for technique                  PATIENT EDUCATION: Education details: Continue current HEP  Person educated: Patient Education method: Explanation, Demonstration, Tactile cues, Verbal cues, and Handouts Education comprehension: verbalized understanding and returned demonstration   HOME EXERCISE PROGRAM Access Code: [email or web address] URL: [email or web address] Date: 12/13/2023 Prepared by: [facility] - Outpatient  Rehab - [facility]  Program Notes perform standing exercises at counter for safety  Exercises - Seated Long Arc Quad  - 1 x daily - 7 x weekly - 3 sets - 10 reps - Seated March  - 1 x daily - 7 x weekly - 3 sets - 10 reps - Seated Ankle Dorsiflexion AROM  - 1 x daily - 7 x weekly - 3 sets - 10 reps - Heel Toe Raises with Counter Support  - 1 x daily - 5 x weekly - 2 sets - 10 reps - Standing Toe Taps  - 1 x daily - 5 x weekly - 2 sets - 10 reps - Alternating Step Forward with Support  - 1 x daily - 5 x weekly - 2 sets - 10 reps    ------------------------------------- Note: Objective measures were completed at Evaluation unless otherwise noted.  DIAGNOSTIC FINDINGS: none recent  COGNITION: Overall cognitive status: Within functional limits for tasks assessed   SENSATION: Pt reports R>L hand numbness   COORDINATION: Alternating pronation/supination: slowed B Alternating toe tap: WNL B Finger to nose: slight dysmetria R>L   MUSCLE TONE: WNL B LEs  POSTURE: rounded shoulders and forward head  LOWER EXTREMITY ROM:     Active  Right Eval Left Eval  Hip flexion    Hip extension    Hip abduction    Hip adduction    Hip internal rotation    Hip external rotation    Knee flexion    Knee extension    Ankle dorsiflexion 15 10  Ankle plantarflexion    Ankle inversion    Ankle eversion     (Blank rows = not tested)  LOWER EXTREMITY MMT:    MMT  Right Eval Left Eval  Hip flexion 4 4+  Hip extension    Hip abduction 4+ 4+  Hip adduction 4 4  Hip internal rotation    Hip external rotation    Knee flexion 4 4  Knee extension 4 4  Ankle dorsiflexion 3+ 4  Ankle plantarflexion 4 4  Ankle inversion    Ankle eversion    (Blank rows = not tested)  GAIT: Findings: Assistive device utilized:Single point cane, Level of assistance: SBA, and Comments: slowed, foot flat B and shuffling gait   FUNCTIONAL TESTS:  5 times sit to stand: 22.04 sec without standing fully and pushing off knees 10 meter walk test: 22.59 sec (1.45 ft/sec)                                                                                                                                TREATMENT DATE: 11/23/23    PATIENT EDUCATION: Education details: edu on benefits of OT and ST for her complaints- pt agreeable to this; prognosis, POC, exam findings as they relate to functional impairments  Person educated: Patient and daughter Education method: Explanation Education comprehension: verbalized understanding  HOME EXERCISE PROGRAM: Not yet initiated  GOALS: Goals reviewed with patient? Yes  SHORT TERM GOALS: Target date: 12/21/2023  Patient to be independent with initial HEP. Baseline: HEP initiated Goal status: IN PROGRESS    LONG TERM GOALS: Target date: 01/25/24  Patient to be independent with advanced HEP. Baseline: Not yet initiated  Goal status: IN PROGRESS  Patient to demonstrate B LE strength >/=4+/5.  Baseline: See above Goal status: IN PROGRESS  Patient to demonstrate 30% improvement in foot clearance with gait.  Baseline: foot flat B Goal status: IN PROGRESS  Patient to demonstrate gait speed of at least 2 ft/sec in order to improve falls risk.  Baseline: 1.45 ft/sec Goal status: IN PROGRESS  Patient to demonstrate 5xSTS test in <15 sec in order to decrease risk of falls.  Baseline: 22.05 Goal status: INITIAL   ASSESSMENT:  CLINICAL  IMPRESSION: Pt presents today without new complaints. Skilled PT session focused on balance and ankle strengthening exercises. With dynamic activities, she demo improved awareness of foot clearance and heelstrike, but eccentric control into foot flat noted; worked on ankle strengthening in sitting and she performs well.  She does need cues for technique and will likely need practice before adding to HEP . Pt will continue to benefit from skilled PT towards goals for improved functional mobility and decreased fall risk.   OBJECTIVE IMPAIRMENTS: Abnormal gait, decreased activity tolerance, decreased balance, decreased coordination, decreased endurance, difficulty walking, decreased strength, postural dysfunction, and pain.   ACTIVITY LIMITATIONS: carrying, lifting, bending, sitting, standing, squatting, sleeping, stairs, transfers, bed mobility, bathing, toileting, dressing, reach over head, hygiene/grooming, and locomotion level  PARTICIPATION LIMITATIONS: meal prep, shopping, community activity, and church  PERSONAL FACTORS: Age, Past/current experiences, Time since onset of injury/illness/exacerbation, and 3+ comorbidities: Rectal CA, chiari malformation s/p surgery, CKD  IV, depression, DM, HTN,  are also affecting patient's functional outcome.   REHAB POTENTIAL: Good  CLINICAL DECISION MAKING: Evolving/moderate complexity  EVALUATION COMPLEXITY: Moderate  PLAN:  PT FREQUENCY: 2x/week  PT DURATION: 8 weeks  PLANNED INTERVENTIONS: 97110-Therapeutic exercises, 97530- Therapeutic activity, [id number]- Neuromuscular re-education, 97535- Self Care, 02859- Manual therapy, [id number]- Gait training, [phone]- Orthotic Initial, [phone]- Canalith repositioning, [phone]- Aquatic Therapy, [phone]- Electrical stimulation (manual), Patient/Family education, Balance training, Stair training, Taping, Vestibular training, Cryotherapy, and Moist heat  PLAN FOR NEXT SESSION: *At end of session, pt asks about aquatic therapy  (it is in plan and on referral)-she would likely be appropriate, but I ran out of time to discuss.  Work to set up aquatic therapy if appropriate.  Progress HEP for RLE strength, balance, work on speed of movement, transfers, foot clearance/heelstrike with gait   Greig Anon, PT 12/20/23 3:37 PM Phone: [phone] Fax: [phone]  [facility] at [facility] [address] [place], [address] [place], [place] [address] Phone # [phone] Fax # [phone]

## 2023-12-24 NOTE — Therapy (Signed)
 OUTPATIENT PHYSICAL THERAPY NEURO TREATMENT NOTE   Patient Name: Lynn Ray MRN: [id number] DOB:Dec 07, 1934, 89 y.o., female Today's Date: 12/24/2023   PCP: Rollene Almarie LABOR, MD  REFERRING PROVIDER: Rollene Almarie LABOR, MD   END OF SESSION:        Past Medical History:  Diagnosis Date   Arthritis    Cancer [facility]) 2004   rectal cancer   Cataract    right eye   Chiari malformation    CKD (chronic kidney disease), stage IV (HCC)    Depression    Diabetes mellitus (HCC)    pre   Hypertension    Sleep apnea    no CPAP   Thyroid  disease    hyporthyroid   Past Surgical History:  Procedure Laterality Date   CATARACT EXTRACTION Bilateral    chiari malformation  2013   surgery   COLONOSCOPY     FLEXIBLE SIGMOIDOSCOPY     POLYPECTOMY     Patient Active Problem List   Diagnosis Date Noted   Sinus pause 07/06/2023   Aphasia, late effect of cerebrovascular disease 04/27/2023   Hx of completed stroke 03/16/2023   Anemia 02/23/2023   Vision changes 04/18/2022   Pre-diabetes 09/29/2020   Stage 4 chronic kidney disease (HCC) 12/22/2019   History of Chiari malformation 10/09/2017   Chronic right shoulder pain 10/09/2017   Routine general medical examination at a health care facility 07/13/2017   History of malignant neoplasm of large intestine 11/18/2010   BARRETT'S ESOPHAGUS 01/14/2008   Hypothyroidism 12/10/2007   Morbid obesity (HCC) 12/10/2007   Essential hypertension 12/10/2007   AORTIC VALVE DISORDERS 12/10/2007   COPD 12/10/2007   GERD 12/09/2007    ONSET DATE: 02/22/23  REFERRING DIAG: S13.26 (ICD-10-CM) - Hx of completed stroke Rollene Almarie LABOR, MD  THERAPY DIAG:  No diagnosis found.  Rationale for Evaluation and Treatment: Rehabilitation  SUBJECTIVE:                                                                                                                                                                                              SUBJECTIVE STATEMENT: Doing pretty good.    Pt accompanied by: family memberdaughter  PERTINENT HISTORY: Rectal CA, chiari malformation s/p surgery, CKD IV, depression, DM, HTN,   PAIN:  Are you having pain? No   PRECAUTIONS: Fall  RED FLAGS: None   WEIGHT BEARING RESTRICTIONS: No  FALLS: Has patient fallen in last 6 months? Yes. Number of falls 1  LIVING ENVIRONMENT: Lives with: lives alone and has a PCA and daughter that stays with her on nights  Lives in: House/apartment  Stairs: 2 steps to enter ; split level home; bedroom on 2nd floor Has following equipment at home: Single point cane, Shower bench, and Grab bars  PLOF: Independent with basic ADLs and Leisure: crafts; PCA helps with more vigorous chores   PATIENT GOALS: get back to normal as much as possible   OBJECTIVE:     TODAY'S TREATMENT: 12/25/23 Activity Comments                        TODAY'S TREATMENT: 12/20/2023 Activity Comments  Standing at parallel bars: -Forward/back walking 5 reps -Marching in place  x 10 -Heel/toe raises  x 10 -Stagger stance forward/back rocking x 10 -forward step over obstacle, alternating legs x 10 -side step over obstacle, alternating legs x 10  For step length/heelstrike, foot clearance Decreased eccentric control with plantarflexion  Standing balance feet apart/feet together/stagger stance EO and EC Mild sway  Ankle strengthening:  seated dorsiflexion 2 x 10 Plantarflexion 2 x 10 Red band-cues for technique                  PATIENT EDUCATION: Education details: Continue current HEP  Person educated: Patient Education method: Explanation, Demonstration, Tactile cues, Verbal cues, and Handouts Education comprehension: verbalized understanding and returned demonstration   HOME EXERCISE PROGRAM Access Code: [email or web address] URL: [email or web address] Date: 12/13/2023 Prepared by: [facility] - Outpatient  Rehab - [facility]  Program  Notes perform standing exercises at counter for safety  Exercises - Seated Long Arc Quad  - 1 x daily - 7 x weekly - 3 sets - 10 reps - Seated March  - 1 x daily - 7 x weekly - 3 sets - 10 reps - Seated Ankle Dorsiflexion AROM  - 1 x daily - 7 x weekly - 3 sets - 10 reps - Heel Toe Raises with Counter Support  - 1 x daily - 5 x weekly - 2 sets - 10 reps - Standing Toe Taps  - 1 x daily - 5 x weekly - 2 sets - 10 reps - Alternating Step Forward with Support  - 1 x daily - 5 x weekly - 2 sets - 10 reps    ------------------------------------- Note: Objective measures were completed at Evaluation unless otherwise noted.  DIAGNOSTIC FINDINGS: none recent  COGNITION: Overall cognitive status: Within functional limits for tasks assessed   SENSATION: Pt reports R>L hand numbness   COORDINATION: Alternating pronation/supination: slowed B Alternating toe tap: WNL B Finger to nose: slight dysmetria R>L   MUSCLE TONE: WNL B LEs  POSTURE: rounded shoulders and forward head  LOWER EXTREMITY ROM:     Active  Right Eval Left Eval  Hip flexion    Hip extension    Hip abduction    Hip adduction    Hip internal rotation    Hip external rotation    Knee flexion    Knee extension    Ankle dorsiflexion 15 10  Ankle plantarflexion    Ankle inversion    Ankle eversion     (Blank rows = not tested)  LOWER EXTREMITY MMT:    MMT  Right Eval Left Eval  Hip flexion 4 4+  Hip extension    Hip abduction 4+ 4+  Hip adduction 4 4  Hip internal rotation    Hip external rotation    Knee flexion 4 4  Knee extension 4 4  Ankle dorsiflexion 3+ 4  Ankle plantarflexion 4 4  Ankle inversion  Ankle eversion    (Blank rows = not tested)  GAIT: Findings: Assistive device utilized:Single point cane, Level of assistance: SBA, and Comments: slowed, foot flat B and shuffling gait   FUNCTIONAL TESTS:  5 times sit to stand: 22.04 sec without standing fully and pushing off knees 10 meter  walk test: 22.59 sec (1.45 ft/sec)                                                                                                                                TREATMENT DATE: 11/23/23    PATIENT EDUCATION: Education details: edu on benefits of OT and ST for her complaints- pt agreeable to this; prognosis, POC, exam findings as they relate to functional impairments  Person educated: Patient and daughter Education method: Explanation Education comprehension: verbalized understanding  HOME EXERCISE PROGRAM: Not yet initiated  GOALS: Goals reviewed with patient? Yes  SHORT TERM GOALS: Target date: 12/21/2023  Patient to be independent with initial HEP. Baseline: HEP initiated Goal status: IN PROGRESS    LONG TERM GOALS: Target date: 01/25/24  Patient to be independent with advanced HEP. Baseline: Not yet initiated  Goal status: IN PROGRESS  Patient to demonstrate B LE strength >/=4+/5.  Baseline: See above Goal status: IN PROGRESS  Patient to demonstrate 30% improvement in foot clearance with gait.  Baseline: foot flat B Goal status: IN PROGRESS  Patient to demonstrate gait speed of at least 2 ft/sec in order to improve falls risk.  Baseline: 1.45 ft/sec Goal status: IN PROGRESS  Patient to demonstrate 5xSTS test in <15 sec in order to decrease risk of falls.  Baseline: 22.05 Goal status: INITIAL   ASSESSMENT:  CLINICAL IMPRESSION: Pt presents today without new complaints. Skilled PT session focused on balance and ankle strengthening exercises. With dynamic activities, she demo improved awareness of foot clearance and heelstrike, but eccentric control into foot flat noted; worked on ankle strengthening in sitting and she performs well.  She does need cues for technique and will likely need practice before adding to HEP . Pt will continue to benefit from skilled PT towards goals for improved functional mobility and decreased fall risk.   OBJECTIVE IMPAIRMENTS:  Abnormal gait, decreased activity tolerance, decreased balance, decreased coordination, decreased endurance, difficulty walking, decreased strength, postural dysfunction, and pain.   ACTIVITY LIMITATIONS: carrying, lifting, bending, sitting, standing, squatting, sleeping, stairs, transfers, bed mobility, bathing, toileting, dressing, reach over head, hygiene/grooming, and locomotion level  PARTICIPATION LIMITATIONS: meal prep, shopping, community activity, and church  PERSONAL FACTORS: Age, Past/current experiences, Time since onset of injury/illness/exacerbation, and 3+ comorbidities: Rectal CA, chiari malformation s/p surgery, CKD IV, depression, DM, HTN,  are also affecting patient's functional outcome.   REHAB POTENTIAL: Good  CLINICAL DECISION MAKING: Evolving/moderate complexity  EVALUATION COMPLEXITY: Moderate  PLAN:  PT FREQUENCY: 2x/week  PT DURATION: 8 weeks  PLANNED INTERVENTIONS: 97110-Therapeutic exercises, 97530- Therapeutic activity, [id number]- Neuromuscular re-education, 97535- Self Care, 02859- Manual therapy, [id number]- Gait  training, 02239- Orthotic Initial, [phone]- Canalith repositioning, 02886- Aquatic Therapy, [phone]- Electrical stimulation (manual), Patient/Family education, Balance training, Stair training, Taping, Vestibular training, Cryotherapy, and Moist heat  PLAN FOR NEXT SESSION: *At end of session, pt asks about aquatic therapy (it is in plan and on referral)-she would likely be appropriate, but I ran out of time to discuss.  Work to set up aquatic therapy if appropriate.  Progress HEP for RLE strength, balance, work on speed of movement, transfers, foot clearance/heelstrike with gait   Greig Anon, PT 12/24/23 12:31 PM Phone: [phone] Fax: [phone]  [facility] at [facility] [address] [place], [address] [place], [place] [address] Phone # [phone] Fax # [phone]

## 2023-12-25 ENCOUNTER — Ambulatory Visit

## 2023-12-25 ENCOUNTER — Other Ambulatory Visit: Payer: Self-pay

## 2023-12-25 ENCOUNTER — Ambulatory Visit: Admitting: Occupational Therapy

## 2023-12-25 ENCOUNTER — Ambulatory Visit: Admitting: Physical Therapy

## 2023-12-25 ENCOUNTER — Encounter: Payer: Self-pay | Admitting: Physical Therapy

## 2023-12-25 DIAGNOSIS — R2681 Unsteadiness on feet: Secondary | ICD-10-CM | POA: Diagnosis not present

## 2023-12-25 DIAGNOSIS — R278 Other lack of coordination: Secondary | ICD-10-CM | POA: Diagnosis not present

## 2023-12-25 DIAGNOSIS — I69351 Hemiplegia and hemiparesis following cerebral infarction affecting right dominant side: Secondary | ICD-10-CM

## 2023-12-25 DIAGNOSIS — R2689 Other abnormalities of gait and mobility: Secondary | ICD-10-CM | POA: Diagnosis not present

## 2023-12-25 DIAGNOSIS — M6281 Muscle weakness (generalized): Secondary | ICD-10-CM

## 2023-12-25 DIAGNOSIS — R4701 Aphasia: Secondary | ICD-10-CM | POA: Diagnosis not present

## 2023-12-25 NOTE — Patient Instructions (Signed)
Aquatic Therapy: What to Expect!  Where:  [facility] at [address] [place], [place] [address] [phone]           How to Prepare:  If you require assistance with dressing, with transportation (ie: wheel chair), or toileting, a caregiver must attend the entire session with you (unless your primary therapists feels this is not necessary).   If there is thunder during your appointment, you will be asked to leave pool area. You have the option to finish your session in the physical therapy area near the gym. Masks in the pool area are optional. Your face will remain dry during your session, so you are welcome to keep your mask on, if desired. You will be spaced at least 6 feet from other aquatic patients.  Please bring your own swim towel to dry off with.   There are Men's and Women's locker rooms with showers, as well as gender neutral bathrooms in the pool area.  Please arrive IN YOUR SUIT and a few minutes prior to your appointment - this helps to avoid delays in starting your session. Head to the pool and await your appointment on the bench on the pool deck. Please make sure to attend to any toileting needs prior to entering the pool. Once on the pool deck your therapist will ask you to sign the Patient  Consent and Assignment of Benefits form. Your therapist may take your blood pressure prior to, during and after your session if indicated. We usually try and create a home exercise program based on activities we do in the pool. Some patients do not want to or do not have the ability to participate in an aquatic home program - this is not a barrier in any way to you participating in aquatic therapy as part of your current therapy plan!  Appointments:  All sessions are 45 minutes  About the pool: Entering the pool: Your therapist will assist you if needed; there are two ways to enter the pool - stairs or a mechanical lift. Your therapist will determine  the most appropriate way for you. Water temperature is usually around 91-95.  There is a lap pool with a temperature around 84 There may be other therapists and patients in the pool at the same time.   Contact Info:            To cancel appointment, please call [facility] at [phone] If you are running late, please call [facility] at [phone]                   

## 2023-12-25 NOTE — Patient Instructions (Signed)
 SABRA

## 2023-12-25 NOTE — Therapy (Signed)
 OUTPATIENT OCCUPATIONAL THERAPY NEURO EVALUATION  Patient Name: KAMA CAMMARANO MRN: [id number] DOB:10/24/1934, 89 y.o., female Today's Date: 12/25/2023  PCP: Rollene Almarie LABOR, MD REFERRING PROVIDER: Rollene Almarie LABOR, MD  END OF SESSION:  OT End of Session - 12/25/23 1417     Visit Number 1    Number of Visits 13    Date for Recertification  02/08/24    Authorization Type [employer] 2025 Copay: $20 OOP: $4000 / $2744 met VL: None (Medical Necessity) **AUTH: REQUIRED**    OT Start Time 1318    OT Stop Time 1400    OT Time Calculation (min) 42 min          Past Medical History:  Diagnosis Date   Arthritis    Cancer (HCC) 2004   rectal cancer   Cataract    right eye   Chiari malformation    CKD (chronic kidney disease), stage IV (HCC)    Depression    Diabetes mellitus (HCC)    pre   Hypertension    Sleep apnea    no CPAP   Thyroid  disease    hyporthyroid   Past Surgical History:  Procedure Laterality Date   CATARACT EXTRACTION Bilateral    chiari malformation  2013   surgery   COLONOSCOPY     FLEXIBLE SIGMOIDOSCOPY     POLYPECTOMY     Patient Active Problem List   Diagnosis Date Noted   Sinus pause 07/06/2023   Aphasia, late effect of cerebrovascular disease 04/27/2023   Hx of completed stroke 03/16/2023   Anemia 02/23/2023   Vision changes 04/18/2022   Pre-diabetes 09/29/2020   Stage 4 chronic kidney disease (HCC) 12/22/2019   History of Chiari malformation 10/09/2017   Chronic right shoulder pain 10/09/2017   Routine general medical examination at a health care facility 07/13/2017   History of malignant neoplasm of large intestine 11/18/2010   BARRETT'S ESOPHAGUS 01/14/2008   Hypothyroidism 12/10/2007   Morbid obesity (HCC) 12/10/2007   Essential hypertension 12/10/2007   AORTIC VALVE DISORDERS 12/10/2007   COPD 12/10/2007   GERD 12/09/2007    ONSET DATE: referral date 11/26/23 (CVA 01/2023)  REFERRING DIAG: R29.898 (ICD-10-CM)  - Hand weakness  THERAPY DIAG:  Hemiplegia and hemiparesis following cerebral infarction affecting right dominant side (HCC) - Plan: Ot plan of care cert/re-cert  Muscle weakness (generalized) - Plan: Ot plan of care cert/re-cert  Other lack of coordination - Plan: Ot plan of care cert/re-cert  Rationale for Evaluation and Treatment: Rehabilitation  SUBJECTIVE:   SUBJECTIVE STATEMENT: Pt reports that she is having difficulty buttoning, tying shoes, handwriting, and typing.  Pt stating that her handwriting has improved some, but is still challenging especially when writing longer passages.   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 Yes: NPRS scale: 4/10 Pain location: R side of neck Pain description: sore, annoying Aggravating factors: positioning during sleep Relieving factors: medicated cream, massage  FALLS: Has patient fallen in last 6 months? No  LIVING ENVIRONMENT: Lives with: lives alone (has healthcare aids that stay with her overnight) Lives in: House/apartment Stairs: 2 steps to enter ; split level home; bedroom on 2nd floor - 6 steps to access Has following equipment at home: Single point cane, shower chair, Grab bars, and handicap height toilet seats  PLOF: Independent, Independent with  basic ADLs, and Leisure: member of a few local organizations, enjoy crafting  PATIENT GOALS: to be able to use dominant UE more independently and resume crafts  OBJECTIVE:  Note: Objective measures were completed at Evaluation unless otherwise noted.  HAND DOMINANCE: Right  ADLs: Overall ADLs: reports increased time to get dressed Transfers/ambulation related to ADLs: utilizing [facility] Eating: some difficulty with scooping and cutting foods Grooming: enjoys wearing makeup, requiring  increased time UB Dressing: difficulty with buttons, zipping dress zippers (in back) LB Dressing: does have difficulty with tying shoes but primarily wearing slip on shoes Toileting: Mod I Bathing: Mod I Tub Shower transfers: increased time and effort when exiting bathtub/shower Equipment: Shower seat with back and Grab bars  IADLs:  Light housekeeping: healthcare aids help with mopping, cleaning bathroom, taking out the trash Meal Prep: minimal cooking, will cook soups or bring in food Community mobility: still driving, however not driving at night time Medication management: utilizing pill box  Financial management: daughter is completing Handwriting: 100% legible and Increased time - requiring 28.19 sec to write [place].  MOBILITY STATUS: Needs Assist: Utilizing [facility] for mobility  POSTURE COMMENTS:  rounded shoulders and forward head  ACTIVITY TOLERANCE: Activity tolerance: diminished  FUNCTIONAL OUTCOME MEASURES: PSFS: 3.0   UPPER EXTREMITY ROM:    Active ROM Right eval Left eval  Shoulder flexion 88 WFL  Shoulder abduction    Shoulder adduction    Shoulder extension    Shoulder internal rotation Able to reach to back pocket, reports mild tingling at end range [facility]  Shoulder external rotation WFL (reports tingling at end range) [facility]  Elbow flexion    Elbow extension    Wrist flexion    Wrist extension    Wrist ulnar deviation    Wrist radial deviation    Wrist pronation    Wrist supination    (Blank rows = not tested)  UPPER EXTREMITY MMT:     MMT Right eval Left eval  Shoulder flexion 4+ 3-  Shoulder abduction    Shoulder adduction    Shoulder extension    Shoulder internal rotation    Shoulder external rotation    Middle trapezius    Lower trapezius    Elbow flexion    Elbow extension    Wrist flexion    Wrist extension    Wrist ulnar deviation    Wrist radial deviation    Wrist pronation    Wrist supination    (Blank rows  = not tested)  HAND FUNCTION: Grip strength: Right: 29 lbs; Left: 36 lbs  COORDINATION: 9 Hole Peg test: Right: 47.15 sec; Left: 42.72 sec Box and Blocks:  Right 35 blocks, Left 40 blocks 3 button/unbutton: 58.79 sec  SENSATION: WFL  EDEMA: NA   COGNITION: Overall cognitive status: Within functional limits for tasks assessed  VISION: Subjective report: no changes Baseline vision: Wears glasses all the time  VISION ASSESSMENT: Not tested  TREATMENT DATE:  12/25/23 Educated on coordination activities to complete and how to increase challenge with smaller or more items.  Provided with demonstration and handouts.  OT educating on functional carryover of tasks as well.     PATIENT EDUCATION: Education details: Educated on role and purpose of OT as well as potential interventions and goals for therapy based on initial evaluation findings. Person educated: Patient Education method: Explanation, Demonstration, and Handouts Education comprehension: verbalized understanding and needs further education  HOME EXERCISE PROGRAM: Coordination HEP - See pt instructions   GOALS: Goals reviewed with patient? Yes  SHORT TERM GOALS: Target date: 01/18/24  Pt will be independent in ROM and coordination HEP. Baseline: decreased ROM, strength, and coordination of dominant UE Goal status: INITIAL  2.  Pt will verbalize understanding of task modifications and/or potential A/E needs to increase ease, safety, and independence w/ ADLs. Baseline: difficulty with tying shoes, buttoning buttons, putting on jewelry and makeup Goal status: INITIAL  3.  Pt will demonstrate improved fine motor coordination for ADLs as evidenced by decreasing 9 hole peg test score for RUE by 3 secs Baseline: 9 Hole Peg test: Right: 47.15 sec; Left: 42.72 sec Goal status: INITIAL  4.  Pt  will verbalizing understanding of typing programs and/or adaptive techniques to allow for increased ease with computer use (voice to text). Baseline:  Goal status: INITIAL   LONG TERM GOALS: Target date: 02/08/23  Pt will demonstrate improved ease with fastening buttons as evidenced by decreasing 3 button/ unbutton time to 35 seconds or less with use of AE PRN. Baseline: 58 sec Goal status: INITIAL  2.  Pt will write a short paragraph with 100% legibility and no significant decrease in quality of writing. Baseline: decreased quality from start to finish Goal status: INITIAL  3.  Pt will demonstrate improved fine motor coordination for ADLs as evidenced by decreasing 9 hole peg test score for RUE by 6 secs Baseline: 9 Hole Peg test: Right: 47.15 sec; Left: 42.72 sec Goal status: INITIAL  4.  Pt will demonstrate ability to retrieve a lightweight object at 110* shoulder flexion for increased ROM as needed for ADLs and IADLs. Baseline: 88* Goal status: INITIAL  5.  Patient will report at least two-point increase in average PSFS score or at least three-point increase in a single activity score indicating functionally significant improvement given minimum detectable change. Baseline: 3.0 Goal status: INITIAL  ASSESSMENT:  CLINICAL IMPRESSION: Patient is a 89 y.o. female who was seen today for occupational therapy evaluation for RUE weakness. Pt s/p CVA in January 2025 with resulting weakness in dominant RUE.  Pt reporting ability to complete majority of ADLs at Mod I level, just requiring increased time and effort.  Pt will benefit from skilled occupational therapy services to address strength and coordination, ROM, pain management, balance, GM/FM control, safety awareness, introduction of compensatory strategies/AE prn, and implementation of an HEP to improve participation and safety during ADLs and IADLs.    PERFORMANCE DEFICITS: in functional skills including ADLs, IADLs, coordination,  ROM, strength, pain, Fine motor control, Gross motor control, body mechanics, decreased knowledge of precautions, decreased knowledge of use of DME, and UE functional use and psychosocial skills including coping strategies, environmental adaptation, and routines and behaviors.   IMPAIRMENTS: are limiting patient from ADLs, IADLs, and leisure.   CO-MORBIDITIES: may have co-morbidities  that affects occupational performance. Patient will benefit from skilled OT to address above impairments and improve overall function.  MODIFICATION OR ASSISTANCE TO COMPLETE  EVALUATION: No modification of tasks or assist necessary to complete an evaluation.  OT OCCUPATIONAL PROFILE AND HISTORY: Problem focused assessment: Including review of records relating to presenting problem.  CLINICAL DECISION MAKING: LOW - limited treatment options, no task modification necessary  REHAB POTENTIAL: Good  EVALUATION COMPLEXITY: Low    PLAN:  OT FREQUENCY: 2x/week  OT DURATION: 6 weeks  PLANNED INTERVENTIONS: 97168 OT Re-evaluation, 97535 self care/ADL training, 02889 therapeutic exercise, 97530 therapeutic activity, 97112 neuromuscular re-education, 97035 ultrasound, 97032 electrical stimulation (manual), passive range of motion, energy conservation, coping strategies training, patient/family education, and DME and/or AE instructions  RECOMMENDED OTHER SERVICES: NA  CONSULTED AND AGREED WITH PLAN OF CARE: Patient  PLAN FOR NEXT SESSION: review and have pt complete coordination HEP, initiate shoulder ROM HEP   Referring diagnosis:  R29.898 (ICD-10-CM) - Hand weakness Treatment diagnosis (if different than referring diagnosis):  Hemiplegia and hemiparesis following cerebral infarction affecting right dominant side (HCC)  Muscle weakness (generalized)  Other lack of coordination Date Symptoms Began: referral date 11/26/23, CVA 01/2023 # of Visits requested: 13  Time period for Authorization: 12/25/23 to  02/08/2024  What was this (referring dx) caused by? []  Surgery []  Fall []  Ongoing issue []  Arthritis [x]  Other: ___CVA_________  Laterality: [x]  Rt []  Lt []  Both  Functional Tool & Score: PSFS 3.0  Check all possible CPT codes:     See Planned Interventions listed in the Plan section of the Evaluation.     If [employer]: Choose 10 or less codes  If [employer] Medicaid: Modalities are not covered  If [employer]: Check allowed ICD code combinations   If [facility] Plan or Cigna: Cognitive training not covered   KAYLENE DOMINO, OTR/L 12/25/2023, 2:38 PM   [facility] at [facility] [address], [address] [place], [place] [address] Phone # [phone] Fax # [phone]

## 2023-12-25 NOTE — Therapy (Signed)
 OUTPATIENT SPEECH LANGUAGE PATHOLOGY APHASIA EVALUATION   Patient Name: Lynn Ray MRN: [id number] DOB:Sep 04, 1934, 89 y.o., female Today's Date: 12/25/2023  PCP: Rollene Norris, MD REFERRING PROVIDER: same as PCP  END OF SESSION:  End of Session - 12/25/23 1534     Visit Number 1    Number of Visits 9    Date for Recertification  02/22/24    Authorization Type human[employer] m-care    SLP Start Time 1406    SLP Stop Time  1446    SLP Time Calculation (min) 40 min    Activity Tolerance Patient tolerated treatment well          Past Medical History:  Diagnosis Date   Arthritis    Cancer (HCC) 2004   rectal cancer   Cataract    right eye   Chiari malformation    CKD (chronic kidney disease), stage IV (HCC)    Depression    Diabetes mellitus (HCC)    pre   Hypertension    Sleep apnea    no CPAP   Thyroid  disease    hyporthyroid   Past Surgical History:  Procedure Laterality Date   CATARACT EXTRACTION Bilateral    chiari malformation  2013   surgery   COLONOSCOPY     FLEXIBLE SIGMOIDOSCOPY     POLYPECTOMY     Patient Active Problem List   Diagnosis Date Noted   Sinus pause 07/06/2023   Aphasia, late effect of cerebrovascular disease 04/27/2023   Hx of completed stroke 03/16/2023   Anemia 02/23/2023   Vision changes 04/18/2022   Pre-diabetes 09/29/2020   Stage 4 chronic kidney disease (HCC) 12/22/2019   History of Chiari malformation 10/09/2017   Chronic right shoulder pain 10/09/2017   Routine general medical examination at a health care facility 07/13/2017   History of malignant neoplasm of large intestine 11/18/2010   BARRETT'S ESOPHAGUS 01/14/2008   Hypothyroidism 12/10/2007   Morbid obesity (HCC) 12/10/2007   Essential hypertension 12/10/2007   AORTIC VALVE DISORDERS 12/10/2007   COPD 12/10/2007   GERD 12/09/2007    ONSET DATE: 02/22/23   REFERRING DIAG: Aphasia  THERAPY DIAG:  Aphasia - Plan: SLP plan of care cert/re-cert  Rationale  for Evaluation and Treatment: Rehabilitation  SUBJECTIVE:   SUBJECTIVE STATEMENT: My talking is not like it was prior to the stroke.  Pt accompanied by: self  PERTINENT HISTORY: Rectal CA, chiari malformation s/p surgery, CKD IV, depression, DM, HTN,  PAIN:  Are you having pain? Yes: NPRS scale: 5/10 Pain location: bil knees Pain description: soreness Aggravating factors: climbing stairs Relieving factors: resting  FALLS: Has patient fallen in last 6 months?  Number of falls: 1; See PT eval.  LIVING ENVIRONMENT: Lives with: lives alone has personal care attendant, and her daughter sometimes stays with her nights Lives in: House/apartment  PLOF:  Level of assistance: Independent with ADLs, Comment: assistance with cleaning and some other more rigorous activities Employment: Retired  PATIENT GOALS: Improve ability to speak like she used to prior to CVA  OBJECTIVE:  Note: Objective measures were completed at Evaluation unless otherwise noted.  DIAGNOSTIC FINDINGS:  MRI- 02/23/23 IMPRESSION: 1. Patchy acute ischemic infarct involving the parasagittal left frontal lobe. Minimal associated petechial blood products without hemorrhagic transformation or significant mass effect. 2. Additional subcentimeter acute to early subacute ischemic nonhemorrhagic infarcts involving the subcortical right frontal lobe and superior left thalamus. 3. Underlying age-related cerebral atrophy with chronic microvascular ischemic disease, with a few scattered remote lacunar infarcts  about the hemispheric cerebral white matter and cerebellum.  ST Discharge from CIR 03/07/23 Clinical Impression/Discharge Summary:  Pt has made good gains and has met 2 of 3 LTG's this admission due to improved cognition. Pt is currently an overall supervisionA for cognitive tasks and requires min cues for utilization of word finding compensatory strategies during conversation. Pt/family education complete and pt will  discharge home with supervision from friends/family/etc. Pt would benefit from f/u ST services to maximize communication in order to maximize functional independence.      COGNITION: Overall cognitive status: Within functional limits for tasks assessed; SLP probed the areas of memory, problem solving, attention, as well as anticipatory awareness via diagnostic conversation. Pt has PCA as well as family attending to pt.  AUDITORY COMPREHENSION: Overall auditory comprehension: Appears intact YES/NO questions: Appears intact Following directions: Appears intact Conversation: Moderately Complex  READING COMPREHENSION: Intact  EXPRESSION: verbal  VERBAL EXPRESSION: Level of generative/spontaneous verbalization: conversation Automatic speech: month of year: intact  Repetition: Appears intact Naming: Confrontation: 76% (23/30) Pragmatics: Appears intact Comments: In mod complex/complex conversation re: her cancer diagnosis and treatment, and reason for seeing Dr. Onesimo recently, pt demonstrated some anomia however pt was functional in her verbal expression using compensations she is spontaneously using.  Interfering components: none Effective technique: pt is using extra time for generating functional language  WRITTEN EXPRESSION: Dominant hand: right Written expression: Appears intact; pt states her hand tires after approx one sentence - SLP observed this and legibility decr'd. SLP notified pt's OT.  MOTOR SPEECH: Overall motor speech: Appears intact  ORAL MOTOR EXAMINATION: Overall status: WFL  STANDARDIZED ASSESSMENTS: [place] NAMING TEST: (BNT-2); 23/30 on ODDS administration of BNT-2. This falls outside the normal range; See impression below for further description.  PATIENT REPORTED OUTCOME MEASURES (PROM): Communication Participation Item Bank: (CPIB); Pt scored herself 18/30 with lower scores indicating greater effect of pt's deficits on her communication participation.                                                                                                                              TREATMENT DATE:   12/25/23: N/A. Pt needs homework tracking sheet in first ST session.  PATIENT EDUCATION: Education details: eval results, possible goals, not-as-robust prognosis for improvement based upon pt age, and time post onset but SLP believes pt will make improvement to some degree Person educated: Patient Education method: Explanation Education comprehension: verbalized understanding   GOALS: Goals reviewed with patient? Yes  SHORT TERM GOALS: Target date: 01/25/24  Pt will improve CPIB to at least 21 at visit #5 Baseline: Goal status: INITIAL  2.  Pt will perform naming tasks using compensations PRN with 90% success in 2 sessions Baseline:  Goal status: INITIAL  3.  Pt will complete homework for anomia/aphasia at least 3 days/week before 01/25/24, as tallied by tracking sheet Baseline:  Goal status: INITIAL  4.  Pt will participate in mod complex conversation for 5  minutes with <4 anomic pauses Baseline:  Goal status: INITIAL   LONG TERM GOALS: Target date: 02/22/24  At visit #9, pt will improve CPIB score to higher than the score noted for STG #1 Baseline:  Goal status: INITIAL  2.  Pt will complete homework for anomia/aphasia at least 3 days/week after 01/25/24, as tallied by tracking sheet Baseline:  Goal status: INITIAL  3.  Pt will participate in mod complex-complex conversation for total 10 minutes with no more than 5 anomic pauses, using compensations PRN Baseline:  Goal status: INITIAL   ASSESSMENT:  CLINICAL IMPRESSION: Patient is a 89 y.o. F who was seen today for assessment of speech and language following a CVA in January 2025. Today she scored 23/30 on the odds administration of the [facility], which is below WNL (mean=27.13, standard deviation=2.06). She tells SLP that she has a lot of difficulty in conversations:  with fast-moving topics, with strangers, when she needs to persuade a family member or friend to see a different point of view, or conversations needing to communicate something quickly. She is highly motivated for positive change.   OBJECTIVE IMPAIRMENTS: include aphasia. These impairments are limiting patient from effectively communicating at home and in community. Factors affecting potential to achieve goals and functional outcome are family/community support (possibly - pt arrived alone today). Patient will benefit from skilled SLP services to address above impairments and improve overall function.  REHAB POTENTIAL: Good  PLAN:  SLP FREQUENCY: 1x/week  SLP DURATION: 8 weeks  PLANNED INTERVENTIONS: Language facilitation, Environmental controls, Cueing hierachy, Internal/external aids, Functional tasks, Multimodal communication approach, SLP instruction and feedback, Compensatory strategies, Patient/family education, and 07492 Treatment of speech (30 or 45 min)     Kerri Kovacik, CCC-SLP 12/25/2023, 4:08 PM  Referring diagnosis:  Aphasia R47.01 Treatment diagnosis (if different than referring diagnosis): same Date Symptoms Began: Jan 2025 # of Visits requested: 12  Time period for Authorization: 12/25/23 to 02/22/24  What was this (referring dx) caused by? []  Surgery []  Fall []  Ongoing issue []  Arthritis [x]  Other: ___CVA_________  Laterality: []  Rt   NA []  Lt []  Both  Functional Tool & Score: [place] Naming Test 23/20  Check all possible CPT codes:     See Planned Interventions listed in the Plan section of the Evaluation.     If Hum[employer]: Choose 10 or less codes  If Healthy [employer] Managed Medicaid: Modalities are not covered  If Wellcare: Check allowed ICD code combinations   If [facility] Plan or Cigna: Cognitive training not covered

## 2024-01-01 ENCOUNTER — Encounter: Payer: Self-pay | Admitting: Physical Therapy

## 2024-01-01 ENCOUNTER — Ambulatory Visit: Admitting: Physical Therapy

## 2024-01-01 DIAGNOSIS — M6281 Muscle weakness (generalized): Secondary | ICD-10-CM | POA: Insufficient documentation

## 2024-01-01 DIAGNOSIS — R2689 Other abnormalities of gait and mobility: Secondary | ICD-10-CM | POA: Diagnosis present

## 2024-01-01 DIAGNOSIS — I69351 Hemiplegia and hemiparesis following cerebral infarction affecting right dominant side: Secondary | ICD-10-CM | POA: Insufficient documentation

## 2024-01-01 DIAGNOSIS — R2681 Unsteadiness on feet: Secondary | ICD-10-CM | POA: Diagnosis present

## 2024-01-01 DIAGNOSIS — R278 Other lack of coordination: Secondary | ICD-10-CM | POA: Insufficient documentation

## 2024-01-01 DIAGNOSIS — R29898 Other symptoms and signs involving the musculoskeletal system: Secondary | ICD-10-CM | POA: Diagnosis present

## 2024-01-01 DIAGNOSIS — R4701 Aphasia: Secondary | ICD-10-CM | POA: Diagnosis present

## 2024-01-01 NOTE — Therapy (Signed)
 OUTPATIENT PHYSICAL THERAPY NEURO TREATMENT NOTE   Patient Name: Lynn Ray MRN: [id number] DOB:12-06-1934, 89 y.o., female Today's Date: 01/01/2024   PCP: Rollene Almarie LABOR, MD  REFERRING PROVIDER: Rollene Almarie LABOR, MD   END OF SESSION:  PT End of Session - 01/01/24 1359     Visit Number 8    Number of Visits 19    Date for Recertification  02/05/24    Authorization Type [employer]    Authorization Time Period 8 visits 10/24-12/26;12 additional PT visits from 01/03/2024-02/05/2024    Authorization - Visit Number 8    Authorization - Number of Visits 8    Progress Note Due on Visit 17   PN was completed at visit 7   PT Start Time 1404    PT Stop Time 1445    PT Time Calculation (min) 41 min    Equipment Utilized During Treatment Gait belt    Activity Tolerance Patient tolerated treatment well    Behavior During Therapy [facility] for tasks assessed/performed                Past Medical History:  Diagnosis Date   Arthritis    Cancer (HCC) 2004   rectal cancer   Cataract    right eye   Chiari malformation    CKD (chronic kidney disease), stage IV (HCC)    Depression    Diabetes mellitus (HCC)    pre   Hypertension    Sleep apnea    no CPAP   Thyroid  disease    hyporthyroid   Past Surgical History:  Procedure Laterality Date   CATARACT EXTRACTION Bilateral    chiari malformation  2013   surgery   COLONOSCOPY     FLEXIBLE SIGMOIDOSCOPY     POLYPECTOMY     Patient Active Problem List   Diagnosis Date Noted   Sinus pause 07/06/2023   Aphasia, late effect of cerebrovascular disease 04/27/2023   Hx of completed stroke 03/16/2023   Anemia 02/23/2023   Vision changes 04/18/2022   Pre-diabetes 09/29/2020   Stage 4 chronic kidney disease (HCC) 12/22/2019   History of Chiari malformation 10/09/2017   Chronic right shoulder pain 10/09/2017   Routine general medical examination at a health care facility 07/13/2017   History of malignant  neoplasm of large intestine 11/18/2010   BARRETT'S ESOPHAGUS 01/14/2008   Hypothyroidism 12/10/2007   Morbid obesity (HCC) 12/10/2007   Essential hypertension 12/10/2007   AORTIC VALVE DISORDERS 12/10/2007   COPD 12/10/2007   GERD 12/09/2007    ONSET DATE: 02/22/23  REFERRING DIAG: S13.26 (ICD-10-CM) - Hx of completed stroke Rollene Almarie LABOR, MD  THERAPY DIAG:  Other abnormalities of gait and mobility  Unsteadiness on feet  Muscle weakness (generalized)  Rationale for Evaluation and Treatment: Rehabilitation  SUBJECTIVE:  SUBJECTIVE STATEMENT: Got all the pool rules and changed my schedule to be able to do the pool therapy.  Pt accompanied by: family member  PERTINENT HISTORY: Rectal CA, chiari malformation s/p surgery, CKD IV, depression, DM, HTN,   PAIN:  Are you having pain? No   PRECAUTIONS: Fall  RED FLAGS: None   WEIGHT BEARING RESTRICTIONS: No  FALLS: Has patient fallen in last 6 months? Yes. Number of falls 1  LIVING ENVIRONMENT: Lives with: lives alone and has a PCA and daughter that stays with her on nights  Lives in: House/apartment Stairs: 2 steps to enter ; split level home; bedroom on 2nd floor Has following equipment at home: Single point cane, Shower bench, and Grab bars  PLOF: Independent with basic ADLs and Leisure: crafts; PCA helps with more vigorous chores   PATIENT GOALS: get back to normal as much as possible   OBJECTIVE:    TODAY'S TREATMENT: 01/01/2024 Activity Comments  NuStep, Level 4, 4 extremities x 6 minutes  Aerobic warm up  Reviewed seated hip adduction Good return demo  Seated leg strengthening: Ankle dorsiflexion 2 x 10 LAQ, 2 x 10 Hamstring curls 2 x 10 Marching in place 2 x 10 Resisted hip abduction 2 x 10 Red band  Standing  forward/back walking Cues for foot clearance, step length  Forward/back stepping x 10 reps        HOME EXERCISE PROGRAM Access Code: [email or web address] URL: [email or web address] Date: 01/01/2024 Prepared by: [facility] - Outpatient  Rehab - [facility]  Program Notes perform standing exercises at counter for safety  Exercises - Heel Toe Raises with Counter Support  - 1 x daily - 5 x weekly - 2 sets - 10 reps - Standing Toe Taps  - 1 x daily - 5 x weekly - 2 sets - 10 reps - Alternating Step Forward with Support  - 1 x daily - 5 x weekly - 2 sets - 10 reps - Standing March with Counter Support  - 1 x daily - 5 x weekly - 2 sets - 20 reps - Seated Hip Adduction Isometrics with Ball  - 1 x daily - 5 x weekly - 2 sets - 10 reps - 5 sec hold - Seated Long Arc Quad  - 1 x daily - 5 x weekly - 2 sets - 10 reps - Seated March with Resistance  - 1 x daily - 5 x weekly - 3 sets - 10 reps - Seated Ankle Dorsiflexion with Resistance  - 1 x daily - 5 x weekly - 3 sets - 10 reps   PATIENT EDUCATION: Education details: Update to HEP for seated resisted exercise; pt reports she verbalizes understanding on aquatic instructions  Person educated: Patient Education method: Explanation, Demonstration, Tactile cues, Verbal cues, and Handouts Education comprehension: verbalized understanding and returned demonstration           ------------------------------------- Note: Objective measures were completed at Evaluation unless otherwise noted.  DIAGNOSTIC FINDINGS: none recent  COGNITION: Overall cognitive status: Within functional limits for tasks assessed   SENSATION: Pt reports R>L hand numbness   COORDINATION: Alternating pronation/supination: slowed B Alternating toe tap: WNL B Finger to nose: slight dysmetria R>L   MUSCLE TONE: WNL B LEs  POSTURE: rounded shoulders and forward head  LOWER EXTREMITY ROM:     Active  Right Eval Left Eval  Hip flexion    Hip  extension    Hip abduction    Hip adduction    Hip internal  rotation    Hip external rotation    Knee flexion    Knee extension    Ankle dorsiflexion 15 10  Ankle plantarflexion    Ankle inversion    Ankle eversion     (Blank rows = not tested)  LOWER EXTREMITY MMT:    MMT  Right Eval Left Eval  Hip flexion 4 4+  Hip extension    Hip abduction 4+ 4+  Hip adduction 4 4  Hip internal rotation    Hip external rotation    Knee flexion 4 4  Knee extension 4 4  Ankle dorsiflexion 3+ 4  Ankle plantarflexion 4 4  Ankle inversion    Ankle eversion    (Blank rows = not tested)  GAIT: Findings: Assistive device utilized:Single point cane, Level of assistance: SBA, and Comments: slowed, foot flat B and shuffling gait   FUNCTIONAL TESTS:  5 times sit to stand: 22.04 sec without standing fully and pushing off knees 10 meter walk test: 22.59 sec (1.45 ft/sec)                                                                                                                                TREATMENT DATE: 11/23/23    PATIENT EDUCATION: Education details: edu on benefits of OT and ST for her complaints- pt agreeable to this; prognosis, POC, exam findings as they relate to functional impairments  Person educated: Patient and daughter Education method: Explanation Education comprehension: verbalized understanding  HOME EXERCISE PROGRAM: Not yet initiated  GOALS: Goals reviewed with patient? Yes  SHORT TERM GOALS: Target date: 12/21/2023  Patient to be independent with initial HEP. Baseline: HEP initiated Goal status: MET    LONG TERM GOALS: Target date: 02/05/24  Patient to be independent with advanced HEP. Baseline: Not yet initiated  Goal status: IN PROGRESS  Patient to demonstrate B LE strength >/=4+/5.  Baseline: See above; improved in R LE, not quite met 12/25/23  Goal status: IN PROGRESS 12/25/23   Patient to demonstrate 30% improvement in foot clearance with  gait.  Baseline: foot flat B; improved initially by 50% but quickly fatigues 12/25/23  Goal status: IN PROGRESS 12/25/23  Patient to demonstrate gait speed of at least 2 ft/sec in order to improve falls risk.  Baseline: 1.45 ft/sec; 1.82 ft/sec 12/25/23 Goal status: IN PROGRESS 12/25/23  Patient to demonstrate 5xSTS test in <15 sec in order to decrease risk of falls.  Baseline: 22.05; 19.34 sec pushing off knees 12/25/23  Goal status: IN PROGRESS 12/25/23    ASSESSMENT:  CLINICAL IMPRESSION: Pt presents today with no new complaints; she does note that family notes she is moving better.  Skilled PT session focused on progressing lower extremity seated exercises, with theraband for HEP.  She performs well and demo good ability to use bands for progressive strengthening at home.  She is to start in pool for aquatic sessions this week, to address strength and balance  with benefits of water to lessen stress on her joints.  She will continue to benefit from skilled PT towards goals for improved functional mobility and decreased fall risk.  OBJECTIVE IMPAIRMENTS: Abnormal gait, decreased activity tolerance, decreased balance, decreased coordination, decreased endurance, difficulty walking, decreased strength, postural dysfunction, and pain.   ACTIVITY LIMITATIONS: carrying, lifting, bending, sitting, standing, squatting, sleeping, stairs, transfers, bed mobility, bathing, toileting, dressing, reach over head, hygiene/grooming, and locomotion level  PARTICIPATION LIMITATIONS: meal prep, shopping, community activity, and church  PERSONAL FACTORS: Age, Past/current experiences, Time since onset of injury/illness/exacerbation, and 3+ comorbidities: Rectal CA, chiari malformation s/p surgery, CKD IV, depression, DM, HTN,  are also affecting patient's functional outcome.   REHAB POTENTIAL: Good  CLINICAL DECISION MAKING: Evolving/moderate complexity  EVALUATION COMPLEXITY: Moderate  PLAN:  PT  FREQUENCY: 2x/week  PT DURATION: 6 weeks  PLANNED INTERVENTIONS: 97110-Therapeutic exercises, 97530- Therapeutic activity, [id number]- Neuromuscular re-education, 97535- Self Care, [id number]- Manual therapy, [id number]- Gait training, [phone]- Orthotic Initial, [phone]- Canalith repositioning, [phone]- Aquatic Therapy, [phone]- Electrical stimulation (manual), Patient/Family education, Balance training, Stair training, Taping, Vestibular training, Cryotherapy, and Moist heat  PLAN FOR NEXT SESSION:  Review and progress HEP for RLE strength, balance, work on speed of movement, transfers, foot clearance/heelstrike with gait    AQUATICS: Frequency: 1 Duration: 4 weeks Special Instruction: endurance, LE strength; pt has mild R hemplegia     Referring diagnosis:  Z86.73 (ICD-10-CM) - Hx of completed stroke Treatment diagnosis (if different than referring diagnosis):  Other abnormalities of gait and mobility  Unsteadiness on feet  Muscle weakness (generalized) Date Symptoms Began: 02/22/23  # of Visits requested: 11  Time period for Authorization: 01/03/24 to 02/05/24  What was this (referring dx) caused by? []  Surgery []  Fall []  Ongoing issue []  Arthritis [x]  Other: _CVA___________  Laterality: [x]  Rt []  Lt []  Both  Functional Tool & Score: 5xSTS: 19.34 sec   Check all possible CPT codes:     See Planned Interventions listed in the Plan section of the Evaluation.     If [employer]: Choose 10 or less codes  If [employer]: Modalities are not covered  If [employer]: Check allowed ICD code combinations   If [facility] Plan or [employer]: Cognitive training not covered    Greig Anon, PT 01/01/24 2:55 PM Phone: [phone] Fax: [phone]   [facility] at [facility] [address], [address] [place], [place] [address] Phone # [phone] Fax # [phone]

## 2024-01-03 ENCOUNTER — Encounter: Payer: Self-pay | Admitting: Physical Therapy

## 2024-01-03 ENCOUNTER — Ambulatory Visit: Admitting: Physical Therapy

## 2024-01-03 DIAGNOSIS — M6281 Muscle weakness (generalized): Secondary | ICD-10-CM

## 2024-01-03 DIAGNOSIS — R2689 Other abnormalities of gait and mobility: Secondary | ICD-10-CM | POA: Diagnosis not present

## 2024-01-03 DIAGNOSIS — R2681 Unsteadiness on feet: Secondary | ICD-10-CM

## 2024-01-03 DIAGNOSIS — I69351 Hemiplegia and hemiparesis following cerebral infarction affecting right dominant side: Secondary | ICD-10-CM

## 2024-01-03 DIAGNOSIS — R278 Other lack of coordination: Secondary | ICD-10-CM

## 2024-01-03 NOTE — Therapy (Signed)
 OUTPATIENT PHYSICAL THERAPY NEURO TREATMENT NOTE   Patient Name: Lynn Ray MRN: [id number] DOB:1934-07-01, 89 y.o., female Today's Date: 01/03/2024   PCP: Rollene Almarie LABOR, MD  REFERRING PROVIDER: Rollene Almarie LABOR, MD   END OF SESSION:  PT End of Session - 01/03/24 1109     Visit Number 9    Number of Visits 19    Date for Recertification  02/05/24    Authorization Type [employer]    Authorization Time Period 8 visits 10/24-12/26;12 additional PT visits from 01/03/2024-02/05/2024    Authorization - Number of Visits 8    Progress Note Due on Visit 17   PN was completed at visit 7   PT Start Time 1103    PT Stop Time 1146    PT Time Calculation (min) 43 min    Equipment Utilized During Treatment Other (comment)   aquatic barbell and other aquatic devices as needed for safety and challenge   Activity Tolerance Patient tolerated treatment well    Behavior During Therapy [facility] for tasks assessed/performed                Past Medical History:  Diagnosis Date   Arthritis    Cancer (HCC) 2004   rectal cancer   Cataract    right eye   Chiari malformation    CKD (chronic kidney disease), stage IV (HCC)    Depression    Diabetes mellitus (HCC)    pre   Hypertension    Sleep apnea    no CPAP   Thyroid  disease    hyporthyroid   Past Surgical History:  Procedure Laterality Date   CATARACT EXTRACTION Bilateral    chiari malformation  2013   surgery   COLONOSCOPY     FLEXIBLE SIGMOIDOSCOPY     POLYPECTOMY     Patient Active Problem List   Diagnosis Date Noted   Sinus pause 07/06/2023   Aphasia, late effect of cerebrovascular disease 04/27/2023   Hx of completed stroke 03/16/2023   Anemia 02/23/2023   Vision changes 04/18/2022   Pre-diabetes 09/29/2020   Stage 4 chronic kidney disease (HCC) 12/22/2019   History of Chiari malformation 10/09/2017   Chronic right shoulder pain 10/09/2017   Routine general medical examination at a health care  facility 07/13/2017   History of malignant neoplasm of large intestine 11/18/2010   BARRETT'S ESOPHAGUS 01/14/2008   Hypothyroidism 12/10/2007   Morbid obesity (HCC) 12/10/2007   Essential hypertension 12/10/2007   AORTIC VALVE DISORDERS 12/10/2007   COPD 12/10/2007   GERD 12/09/2007    ONSET DATE: 02/22/23  REFERRING DIAG: S13.26 (ICD-10-CM) - Hx of completed stroke Rollene Almarie LABOR, MD  THERAPY DIAG:  Other abnormalities of gait and mobility  Unsteadiness on feet  Muscle weakness (generalized)  Hemiplegia and hemiparesis following cerebral infarction affecting right dominant side (HCC)  Other lack of coordination  Rationale for Evaluation and Treatment: Rehabilitation  SUBJECTIVE:  SUBJECTIVE STATEMENT: Pt presents to [facility] for initial aquatic appt alone using SPC to ambulate.  She was nervous she would be late.  Pt accompanied by: family member  PERTINENT HISTORY: Rectal CA, chiari malformation s/p surgery, CKD IV, depression, DM, HTN,   PAIN:  Are you having pain? No   PRECAUTIONS: Fall  RED FLAGS: None   WEIGHT BEARING RESTRICTIONS: No  FALLS: Has patient fallen in last 6 months? Yes. Number of falls 1  LIVING ENVIRONMENT: Lives with: lives alone and has a PCA and daughter that stays with her on nights  Lives in: House/apartment Stairs: 2 steps to enter ; split level home; bedroom on 2nd floor Has following equipment at home: Single point cane, Shower bench, and Grab bars  PLOF: Independent with basic ADLs and Leisure: crafts; PCA helps with more vigorous chores   PATIENT GOALS: get back to normal as much as possible   OBJECTIVE:    HOME EXERCISE PROGRAM Access Code: [email or web address] URL: [email or web address] Date: 01/01/2024 Prepared by: [facility] -  Outpatient  Rehab - [facility]  Program Notes perform standing exercises at counter for safety  Exercises - Heel Toe Raises with Counter Support  - 1 x daily - 5 x weekly - 2 sets - 10 reps - Standing Toe Taps  - 1 x daily - 5 x weekly - 2 sets - 10 reps - Alternating Step Forward with Support  - 1 x daily - 5 x weekly - 2 sets - 10 reps - Standing March with Counter Support  - 1 x daily - 5 x weekly - 2 sets - 20 reps - Seated Hip Adduction Isometrics with Ball  - 1 x daily - 5 x weekly - 2 sets - 10 reps - 5 sec hold - Seated Long Arc Quad  - 1 x daily - 5 x weekly - 2 sets - 10 reps - Seated March with Resistance  - 1 x daily - 5 x weekly - 3 sets - 10 reps - Seated Ankle Dorsiflexion with Resistance  - 1 x daily - 5 x weekly - 3 sets - 10 reps   PATIENT EDUCATION: Education details: Update to HEP for seated resisted exercise; pt reports she verbalizes understanding on aquatic instructions  Person educated: Patient Education method: Explanation, Demonstration, Tactile cues, Verbal cues, and Handouts Education comprehension: verbalized understanding and returned demonstration           ------------------------------------- Note: Objective measures were completed at Evaluation unless otherwise noted.  DIAGNOSTIC FINDINGS: none recent  COGNITION: Overall cognitive status: Within functional limits for tasks assessed   SENSATION: Pt reports R>L hand numbness   COORDINATION: Alternating pronation/supination: slowed B Alternating toe tap: WNL B Finger to nose: slight dysmetria R>L   MUSCLE TONE: WNL B LEs  POSTURE: rounded shoulders and forward head  LOWER EXTREMITY ROM:     Active  Right Eval Left Eval  Hip flexion    Hip extension    Hip abduction    Hip adduction    Hip internal rotation    Hip external rotation    Knee flexion    Knee extension    Ankle dorsiflexion 15 10  Ankle plantarflexion    Ankle inversion    Ankle eversion      (Blank rows = not tested)  LOWER EXTREMITY MMT:    MMT  Right Eval Left Eval  Hip flexion 4 4+  Hip extension    Hip abduction 4+ 4+  Hip adduction  4 4  Hip internal rotation    Hip external rotation    Knee flexion 4 4  Knee extension 4 4  Ankle dorsiflexion 3+ 4  Ankle plantarflexion 4 4  Ankle inversion    Ankle eversion    (Blank rows = not tested)  GAIT: Findings: Assistive device utilized:Single point cane, Level of assistance: SBA, and Comments: slowed, foot flat B and shuffling gait   FUNCTIONAL TESTS:  5 times sit to stand: 22.04 sec without standing fully and pushing off knees 10 meter walk test: 22.59 sec (1.45 ft/sec)                                                                                                                                TREATMENT DATE: 01/03/24  Aquatic therapy at [facility] - pool temperature 92 degrees   Patient seen for aquatic therapy today.  Treatment took place in water 3.6-4.8 feet deep depending upon activity.  Patient entered and exited the pool via stairs using step to pattern w/ LLE leading and L rail at SBA level.   Exercises: -Water walking warmup 4x18 ft forward > backward > laterally w/ aquatic barbell, pt requires min to moderate cuing to maintain proper task -Walking march w/ barbell 4x18 ft -Standing hip 3-way w/ barbell support 2x10 each LE each direction -Time spent trying to have pt engage core to unsupported sitting rest break - pt unable to safely maintain upright so PT entered water to provide minA to correct seated posture and obtain feet on ground -STS 2x15 w/ barbell support SBA -Alt 6 step ups x30 w/ BUE support, min-mod cues to switch to left foot by end of task   Patient requires buoyancy of the water for support for reduced fall risk with gait training and balance exercises with SBA support, moderate multimodal cuing and return demonstration. Exercises able to be performed safely in water without the risk of  fall compared to those same exercises performed on land; viscosity of water needed for resistance for strengthening. Current of water provides perturbations for challenging static and dynamic balance.   PATIENT EDUCATION: Education details: recommending caregiver come to pool w/ pt and she continue using her cane for safest ambulation Person educated: Patient and daughter Education method: Explanation Education comprehension: verbalized understanding  HOME EXERCISE PROGRAM: Not yet initiated  GOALS: Goals reviewed with patient? Yes  SHORT TERM GOALS: Target date: 12/21/2023  Patient to be independent with initial HEP. Baseline: HEP initiated Goal status: MET    LONG TERM GOALS: Target date: 02/05/24  Patient to be independent with advanced HEP. Baseline: Not yet initiated  Goal status: IN PROGRESS  Patient to demonstrate B LE strength >/=4+/5.  Baseline: See above; improved in R LE, not quite met 12/25/23  Goal status: IN PROGRESS 12/25/23   Patient to demonstrate 30% improvement in foot clearance with gait.  Baseline: foot flat B; improved initially by 50% but quickly fatigues 12/25/23  Goal status: IN PROGRESS 12/25/23  Patient to demonstrate gait speed of at least 2 ft/sec in order to improve falls risk.  Baseline: 1.45 ft/sec; 1.82 ft/sec 12/25/23 Goal status: IN PROGRESS 12/25/23  Patient to demonstrate 5xSTS test in <15 sec in order to decrease risk of falls.  Baseline: 22.05; 19.34 sec pushing off knees 12/25/23  Goal status: IN PROGRESS 12/25/23    ASSESSMENT:  CLINICAL IMPRESSION: Pt seen for initial aquatic visit at [facility] today.  She was most challenged by unsupported sitting due to poor core engagement and difficulty sequencing as cued so minA provided to correct seated posture.  She would likely benefit from having a caregiver attend pool sessions as pt had some mild-moderate fatigue on exiting the pool and requires some help navigating clinic environment.   Will continue to address balance deficits in aquatic setting as SLS and righting responses impaired as noted with tasks today.  Continue per POC. OBJECTIVE IMPAIRMENTS: Abnormal gait, decreased activity tolerance, decreased balance, decreased coordination, decreased endurance, difficulty walking, decreased strength, postural dysfunction, and pain.   ACTIVITY LIMITATIONS: carrying, lifting, bending, sitting, standing, squatting, sleeping, stairs, transfers, bed mobility, bathing, toileting, dressing, reach over head, hygiene/grooming, and locomotion level  PARTICIPATION LIMITATIONS: meal prep, shopping, community activity, and church  PERSONAL FACTORS: Age, Past/current experiences, Time since onset of injury/illness/exacerbation, and 3+ comorbidities: Rectal CA, chiari malformation s/p surgery, CKD IV, depression, DM, HTN,  are also affecting patient's functional outcome.   REHAB POTENTIAL: Good  CLINICAL DECISION MAKING: Evolving/moderate complexity  EVALUATION COMPLEXITY: Moderate  PLAN:  PT FREQUENCY: 2x/week  PT DURATION: 6 weeks  PLANNED INTERVENTIONS: 97110-Therapeutic exercises, 97530- Therapeutic activity, [id number]- Neuromuscular re-education, 97535- Self Care, 02859- Manual therapy, [id number]- Gait training, [phone]- Orthotic Initial, [phone]- Canalith repositioning, [phone]- Aquatic Therapy, [phone]- Electrical stimulation (manual), Patient/Family education, Balance training, Stair training, Taping, Vestibular training, Cryotherapy, and Moist heat  PLAN FOR NEXT SESSION:  Review and progress HEP for RLE strength, balance, work on speed of movement, transfers, foot clearance/heelstrike with gait; PLEASE RECOMMEND CAREGIVER COME WITH PATIENT TO POOL - difficulty navigating [facility] alone in addition to fatigue when exiting pool; core engagement/unsupported sitting    AQUATICS: Frequency: 1 Duration: 4 weeks Special Instruction: endurance, LE strength; pt has mild R hemplegia     Referring  diagnosis:  Z86.73 (ICD-10-CM) - Hx of completed stroke Treatment diagnosis (if different than referring diagnosis):  Other abnormalities of gait and mobility  Unsteadiness on feet  Muscle weakness (generalized) Date Symptoms Began: 02/22/23  # of Visits requested: 11  Time period for Authorization: 01/03/24 to 02/05/24  What was this (referring dx) caused by? []  Surgery []  Fall []  Ongoing issue []  Arthritis [x]  Other: _CVA___________  Laterality: [x]  Rt []  Lt []  Both  Functional Tool & Score: 5xSTS: 19.34 sec   Check all possible CPT codes:     See Planned Interventions listed in the Plan section of the Evaluation.     If [employer]: Choose 10 or less codes  If [employer] Medicaid: Modalities are not covered  If [employer]: Check allowed ICD code combinations   If [facility] Plan or [employer]: Cognitive training not covered    Daved Bull, PT, DPT

## 2024-01-08 ENCOUNTER — Ambulatory Visit

## 2024-01-08 ENCOUNTER — Ambulatory Visit: Admitting: Rehabilitation

## 2024-01-08 ENCOUNTER — Encounter: Payer: Self-pay | Admitting: Rehabilitation

## 2024-01-08 DIAGNOSIS — I69351 Hemiplegia and hemiparesis following cerebral infarction affecting right dominant side: Secondary | ICD-10-CM

## 2024-01-08 DIAGNOSIS — M6281 Muscle weakness (generalized): Secondary | ICD-10-CM

## 2024-01-08 DIAGNOSIS — R2681 Unsteadiness on feet: Secondary | ICD-10-CM

## 2024-01-08 DIAGNOSIS — R2689 Other abnormalities of gait and mobility: Secondary | ICD-10-CM

## 2024-01-08 NOTE — Therapy (Signed)
 OUTPATIENT PHYSICAL THERAPY NEURO TREATMENT NOTE   Patient Name: Lynn Ray MRN: [id number] DOB:Mar 18, 1934, 89 y.o., female Today's Date: 01/08/2024   PCP: Rollene Almarie LABOR, MD  REFERRING PROVIDER: Rollene Almarie LABOR, MD   END OF SESSION:  PT End of Session - 01/08/24 1234     Visit Number 10    Number of Visits 19    Date for Recertification  02/05/24    Authorization Type [employer]    Authorization Time Period 8 visits 10/24-12/26;12 additional PT visits from 01/03/2024-02/05/2024    Authorization - Number of Visits 9    Progress Note Due on Visit 17    PT Start Time 1105    PT Stop Time 1145    PT Time Calculation (min) 40 min    Equipment Utilized During Treatment Other (comment)   floatation devices as needed for safety   Activity Tolerance Patient tolerated treatment well                Past Medical History:  Diagnosis Date   Arthritis    Cancer (HCC) 2004   rectal cancer   Cataract    right eye   Chiari malformation    CKD (chronic kidney disease), stage IV (HCC)    Depression    Diabetes mellitus (HCC)    pre   Hypertension    Sleep apnea    no CPAP   Thyroid  disease    hyporthyroid   Past Surgical History:  Procedure Laterality Date   CATARACT EXTRACTION Bilateral    chiari malformation  2013   surgery   COLONOSCOPY     FLEXIBLE SIGMOIDOSCOPY     POLYPECTOMY     Patient Active Problem List   Diagnosis Date Noted   Sinus pause 07/06/2023   Aphasia, late effect of cerebrovascular disease 04/27/2023   Hx of completed stroke 03/16/2023   Anemia 02/23/2023   Vision changes 04/18/2022   Pre-diabetes 09/29/2020   Stage 4 chronic kidney disease (HCC) 12/22/2019   History of Chiari malformation 10/09/2017   Chronic right shoulder pain 10/09/2017   Routine general medical examination at a health care facility 07/13/2017   History of malignant neoplasm of large intestine 11/18/2010   BARRETT'S ESOPHAGUS 01/14/2008    Hypothyroidism 12/10/2007   Morbid obesity (HCC) 12/10/2007   Essential hypertension 12/10/2007   AORTIC VALVE DISORDERS 12/10/2007   COPD 12/10/2007   GERD 12/09/2007    ONSET DATE: 02/22/23  REFERRING DIAG: S13.26 (ICD-10-CM) - Hx of completed stroke Rollene Almarie LABOR, MD  THERAPY DIAG:  Other abnormalities of gait and mobility  Unsteadiness on feet  Muscle weakness (generalized)  Hemiplegia and hemiparesis following cerebral infarction affecting right dominant side (HCC)  Rationale for Evaluation and Treatment: Rehabilitation  SUBJECTIVE:  SUBJECTIVE STATEMENT: Pt presents to [facility] for initial aquatic appt alone using SPC to ambulate.  She was nervous she would be late.  Pt accompanied by: family member  PERTINENT HISTORY: Rectal CA, chiari malformation s/p surgery, CKD IV, depression, DM, HTN,   PAIN:  Are you having pain? No   PRECAUTIONS: Fall  RED FLAGS: None   WEIGHT BEARING RESTRICTIONS: No  FALLS: Has patient fallen in last 6 months? Yes. Number of falls 1  LIVING ENVIRONMENT: Lives with: lives alone and has a PCA and daughter that stays with her on nights  Lives in: House/apartment Stairs: 2 steps to enter ; split level home; bedroom on 2nd floor Has following equipment at home: Single point cane, Shower bench, and Grab bars  PLOF: Independent with basic ADLs and Leisure: crafts; PCA helps with more vigorous chores   PATIENT GOALS: get back to normal as much as possible   OBJECTIVE:    HOME EXERCISE PROGRAM Access Code: [email or web address] URL: [email or web address] Date: 01/01/2024 Prepared by: [facility] - Outpatient  Rehab - [facility]  Program Notes perform standing exercises at counter for safety  Exercises - Heel Toe Raises with Counter  Support  - 1 x daily - 5 x weekly - 2 sets - 10 reps - Standing Toe Taps  - 1 x daily - 5 x weekly - 2 sets - 10 reps - Alternating Step Forward with Support  - 1 x daily - 5 x weekly - 2 sets - 10 reps - Standing March with Counter Support  - 1 x daily - 5 x weekly - 2 sets - 20 reps - Seated Hip Adduction Isometrics with Ball  - 1 x daily - 5 x weekly - 2 sets - 10 reps - 5 sec hold - Seated Long Arc Quad  - 1 x daily - 5 x weekly - 2 sets - 10 reps - Seated March with Resistance  - 1 x daily - 5 x weekly - 3 sets - 10 reps - Seated Ankle Dorsiflexion with Resistance  - 1 x daily - 5 x weekly - 3 sets - 10 reps   PATIENT EDUCATION: Education details: Update to HEP for seated resisted exercise; pt reports she verbalizes understanding on aquatic instructions  Person educated: Patient Education method: Explanation, Demonstration, Tactile cues, Verbal cues, and Handouts Education comprehension: verbalized understanding and returned demonstration           ------------------------------------- Note: Objective measures were completed at Evaluation unless otherwise noted.  DIAGNOSTIC FINDINGS: none recent  COGNITION: Overall cognitive status: Within functional limits for tasks assessed   SENSATION: Pt reports R>L hand numbness   COORDINATION: Alternating pronation/supination: slowed B Alternating toe tap: WNL B Finger to nose: slight dysmetria R>L   MUSCLE TONE: WNL B LEs  POSTURE: rounded shoulders and forward head  LOWER EXTREMITY ROM:     Active  Right Eval Left Eval  Hip flexion    Hip extension    Hip abduction    Hip adduction    Hip internal rotation    Hip external rotation    Knee flexion    Knee extension    Ankle dorsiflexion 15 10  Ankle plantarflexion    Ankle inversion    Ankle eversion     (Blank rows = not tested)  LOWER EXTREMITY MMT:    MMT  Right Eval Left Eval  Hip flexion 4 4+  Hip extension    Hip abduction 4+ 4+  Hip adduction  4 4  Hip internal rotation    Hip external rotation    Knee flexion 4 4  Knee extension 4 4  Ankle dorsiflexion 3+ 4  Ankle plantarflexion 4 4  Ankle inversion    Ankle eversion    (Blank rows = not tested)  GAIT: Findings: Assistive device utilized:Single point cane, Level of assistance: SBA, and Comments: slowed, foot flat B and shuffling gait   FUNCTIONAL TESTS:  5 times sit to stand: 22.04 sec without standing fully and pushing off knees 10 meter walk test: 22.59 sec (1.45 ft/sec)                                                                                                                                TREATMENT DATE: 01/08/24  Aquatic therapy at [facility] - pool temperature 92 degrees   Patient seen for aquatic therapy today.  Treatment took place in water 3.6-4.8 feet deep depending upon activity.  Patient entered and exited the pool via stairs using step to pattern w/ LLE leading and L rail at SBA level.   Exercises: -Water walking warmup 4x18 ft forward > backward > laterally w/ aquatic barbell, pt min cues for upright and relaxed posture but did better maintaining task without cuing.   -Walking march w/ barbell 4x18 ft again with cues for relaxed shoulders, improved core activation and slow controlled R lateral weight shift.   -Standing hip 3-way stepping (ant/lateral/retro) x 4 rounds on each side with light HHA from PT but did note improvement with increased repetition.   -Staggered stance ant/post weight shift with yellow barbells for support>adding in moving arms together with forward weight shift and backwards with post weight shift x 10 reps each side.  -Wall bumps x 5 reps however this is when pt got cramp in RLE and needed to rest.  PT attempted hamstring stretch but did not help. Had her walk 2 laps with HHA to reduce pain and then had her sit on pool bench with passive ROM to RLE. Pt reports pain has lessened and felt ready to get out of pool.    Note that PT did  discuss urine incontinence with her as she endorses wearing depends in pool.  Educated that she would need to wear water briefs while in pool and that pharmacy [facility] does sell them.   She states she is okay to be without depends for session but will go up front to purchase water briefs.    Patient requires buoyancy of the water for support for reduced fall risk with gait training and balance exercises with SBA support, moderate multimodal cuing and return demonstration. Exercises able to be performed safely in water without the risk of fall compared to those same exercises performed on land; viscosity of water needed for resistance for strengthening. Current of water provides perturbations for challenging static and dynamic balance.   PATIENT EDUCATION: Education details: recommending caregiver come to pool w/ pt  and she continue using her cane for safest ambulation Person educated: Patient and daughter Education method: Explanation Education comprehension: verbalized understanding  HOME EXERCISE PROGRAM: Not yet initiated  GOALS: Goals reviewed with patient? Yes  SHORT TERM GOALS: Target date: 12/21/2023  Patient to be independent with initial HEP. Baseline: HEP initiated Goal status: MET    LONG TERM GOALS: Target date: 02/05/24  Patient to be independent with advanced HEP. Baseline: Not yet initiated  Goal status: IN PROGRESS  Patient to demonstrate B LE strength >/=4+/5.  Baseline: See above; improved in R LE, not quite met 12/25/23  Goal status: IN PROGRESS 12/25/23   Patient to demonstrate 30% improvement in foot clearance with gait.  Baseline: foot flat B; improved initially by 50% but quickly fatigues 12/25/23  Goal status: IN PROGRESS 12/25/23  Patient to demonstrate gait speed of at least 2 ft/sec in order to improve falls risk.  Baseline: 1.45 ft/sec; 1.82 ft/sec 12/25/23 Goal status: IN PROGRESS 12/25/23  Patient to demonstrate 5xSTS test in <15 sec in order to  decrease risk of falls.  Baseline: 22.05; 19.34 sec pushing off knees 12/25/23  Goal status: IN PROGRESS 12/25/23    ASSESSMENT:  CLINICAL IMPRESSION: Skilled session focused on dynamic standing balance, SLS and staggered stance tasks as well as stepping and hip strategy with decreasing UE support from PT.  She continues to need external support from floatation devices but overall did well.  She did get cramping in RLE at end of session needing seated rest break before getting out of pool.   OBJECTIVE IMPAIRMENTS: Abnormal gait, decreased activity tolerance, decreased balance, decreased coordination, decreased endurance, difficulty walking, decreased strength, postural dysfunction, and pain.   ACTIVITY LIMITATIONS: carrying, lifting, bending, sitting, standing, squatting, sleeping, stairs, transfers, bed mobility, bathing, toileting, dressing, reach over head, hygiene/grooming, and locomotion level  PARTICIPATION LIMITATIONS: meal prep, shopping, community activity, and church  PERSONAL FACTORS: Age, Past/current experiences, Time since onset of injury/illness/exacerbation, and 3+ comorbidities: Rectal CA, chiari malformation s/p surgery, CKD IV, depression, DM, HTN,  are also affecting patient's functional outcome.   REHAB POTENTIAL: Good  CLINICAL DECISION MAKING: Evolving/moderate complexity  EVALUATION COMPLEXITY: Moderate  PLAN:  PT FREQUENCY: 2x/week  PT DURATION: 6 weeks  PLANNED INTERVENTIONS: 97110-Therapeutic exercises, 97530- Therapeutic activity, [id number]- Neuromuscular re-education, 97535- Self Care, [id number]- Manual therapy, [id number]- Gait training, [phone]- Orthotic Initial, [phone]- Canalith repositioning, [phone]- Aquatic Therapy, [phone]- Electrical stimulation (manual), Patient/Family education, Balance training, Stair training, Taping, Vestibular training, Cryotherapy, and Moist heat  PLAN FOR NEXT SESSION:  Review and progress HEP for RLE strength, balance, work on speed of  movement, transfers, foot clearance/heelstrike with gait; PLEASE RECOMMEND CAREGIVER COME WITH PATIENT TO POOL (I talked to her about this but she doesn't have anyone to come with her.  She seemed to get back here a little better from a cognitive standpoint but she was still fatigued.  I just told her to take her time and sit where needed on the way out - difficulty navigating [facility] alone in addition to fatigue when exiting pool; core engagement/unsupported sitting    AQUATICS: Frequency: 1 Duration: 4 weeks Special Instruction: endurance, LE strength; pt has mild R hemplegia     Referring diagnosis:  Z86.73 (ICD-10-CM) - Hx of completed stroke Treatment diagnosis (if different than referring diagnosis):  Other abnormalities of gait and mobility  Unsteadiness on feet  Muscle weakness (generalized) Date Symptoms Began: 02/22/23  # of Visits requested: 11  Time period for Authorization: 01/03/24 to 02/05/24  What was this (referring dx) caused by? []  Surgery []  Fall []  Ongoing issue []  Arthritis [x]  Other: _CVA___________  Laterality: [x]  Rt []  Lt []  Both  Functional Tool & Score: 5xSTS: 19.34 sec   Check all possible CPT codes:     See Planned Interventions listed in the Plan section of the Evaluation.     If [employer]: Choose 10 or less codes  If [employer]: Modalities are not covered  If [employer]: Check allowed ICD code combinations   If [facility] Plan or [employer]: Cognitive training not covered   Damien Fought, PT, MPT [facility] [address] [place], [place], [address] Phone: [phone]   Fax:  [phone] 01/08/24, 12:35 PM

## 2024-01-09 NOTE — Therapy (Signed)
 OUTPATIENT PHYSICAL THERAPY NEURO TREATMENT NOTE   Patient Name: Lynn Ray MRN: [id number] DOB:Oct 04, 1934, 89 y.o., female Today's Date: 01/10/2024   PCP: Rollene Almarie LABOR, MD  REFERRING PROVIDER: Rollene Almarie LABOR, MD   END OF SESSION:  PT End of Session - 01/10/24 1441     Visit Number 11    Number of Visits 19    Date for Recertification  02/05/24    Authorization Type [employer]    Authorization Time Period 8 visits 10/24-12/26;12 additional PT visits from 01/03/2024-02/05/2024    Authorization - Visit Number 3    Authorization - Number of Visits 12    Progress Note Due on Visit 17    PT Start Time 1403    PT Stop Time 1445    PT Time Calculation (min) 42 min    Equipment Utilized During Treatment Gait belt    Activity Tolerance Patient tolerated treatment well    Behavior During Therapy WFL for tasks assessed/performed                 Past Medical History:  Diagnosis Date   Arthritis    Cancer (HCC) 2004   rectal cancer   Cataract    right eye   Chiari malformation    CKD (chronic kidney disease), stage IV (HCC)    Depression    Diabetes mellitus (HCC)    pre   Hypertension    Sleep apnea    no CPAP   Thyroid  disease    hyporthyroid   Past Surgical History:  Procedure Laterality Date   CATARACT EXTRACTION Bilateral    chiari malformation  2013   surgery   COLONOSCOPY     FLEXIBLE SIGMOIDOSCOPY     POLYPECTOMY     Patient Active Problem List   Diagnosis Date Noted   Sinus pause 07/06/2023   Aphasia, late effect of cerebrovascular disease 04/27/2023   Hx of completed stroke 03/16/2023   Anemia 02/23/2023   Vision changes 04/18/2022   Pre-diabetes 09/29/2020   Stage 4 chronic kidney disease (HCC) 12/22/2019   History of Chiari malformation 10/09/2017   Chronic right shoulder pain 10/09/2017   Routine general medical examination at a health care facility 07/13/2017   History of malignant neoplasm of large intestine  11/18/2010   BARRETT'S ESOPHAGUS 01/14/2008   Hypothyroidism 12/10/2007   Morbid obesity (HCC) 12/10/2007   Essential hypertension 12/10/2007   AORTIC VALVE DISORDERS 12/10/2007   COPD 12/10/2007   GERD 12/09/2007    ONSET DATE: 02/22/23  REFERRING DIAG: S13.26 (ICD-10-CM) - Hx of completed stroke Rollene Almarie LABOR, MD  THERAPY DIAG:  Other abnormalities of gait and mobility  Unsteadiness on feet  Muscle weakness (generalized)  Rationale for Evaluation and Treatment: Rehabilitation  SUBJECTIVE:  SUBJECTIVE STATEMENT: My L arm is hurting. Didn't notice it until I started driving. I think I slept on it wrong. Feeling better now. Denies chest pain. Reports a hx of neck pain and R/L UE N/T since her chiari surgery.   Pt accompanied by: family member  PERTINENT HISTORY: Rectal CA, chiari malformation s/p surgery, CKD IV, depression, DM, HTN,   PAIN:  Are you having pain? Yes: NPRS scale: 3-4/10 Pain location: L side of neck radiating to L elbow Pain description: sharp, N/T Aggravating factors: driving, reaching overhead Relieving factors: nothing   PRECAUTIONS: Fall  RED FLAGS: None   WEIGHT BEARING RESTRICTIONS: No  FALLS: Has patient fallen in last 6 months? Yes. Number of falls 1  LIVING ENVIRONMENT: Lives with: lives alone and has a PCA and daughter that stays with her on nights  Lives in: House/apartment Stairs: 2 steps to enter ; split level home; bedroom on 2nd floor Has following equipment at home: Single point cane, Shower bench, and Grab bars  PLOF: Independent with basic ADLs and Leisure: crafts; PCA helps with more vigorous chores   PATIENT GOALS: get back to normal as much as possible   OBJECTIVE:      TODAY'S TREATMENT: 01/10/24 Activity Comments  Nustep  L4 x 7 min LEs only Dynamic warm up   mini squats to mat 2x10  Pushing backs of legs into mat for stability   gait training with SPC, matching PT's changes in speed, quick stops/starts, multidirectional walking  Difficulty keeping up with PT's pace; apprehension walking backwards   sidestepping over hurdle Weaned to 1 UE support  Fwd stepping over over hurdle Wean to using cane only; good stability     PATIENT EDUCATION: Education details: discussed pt's experience with aquatic PT and reiterated recommendation to have family member/caregiver escort her to the pool at [facility]- pt reports that she does not have family in the area that would be available. She states that she can check with her aids. Advised patient to call PCP if she starts noticing worsening N/T or UE weakness d/t concern for cervical radiculopathy  Person educated: Patient Education method: Explanation Education comprehension: verbalized understanding    HOME EXERCISE PROGRAM Access Code: [email or web address] URL: [email or web address] Date: 01/01/2024 Prepared by: [facility] - Outpatient  Rehab - [facility]  Program Notes perform standing exercises at counter for safety  Exercises - Heel Toe Raises with Counter Support  - 1 x daily - 5 x weekly - 2 sets - 10 reps - Standing Toe Taps  - 1 x daily - 5 x weekly - 2 sets - 10 reps - Alternating Step Forward with Support  - 1 x daily - 5 x weekly - 2 sets - 10 reps - Standing March with Counter Support  - 1 x daily - 5 x weekly - 2 sets - 20 reps - Seated Hip Adduction Isometrics with Ball  - 1 x daily - 5 x weekly - 2 sets - 10 reps - 5 sec hold - Seated Long Arc Quad  - 1 x daily - 5 x weekly - 2 sets - 10 reps - Seated March with Resistance  - 1 x daily - 5 x weekly - 3 sets - 10 reps - Seated Ankle Dorsiflexion with Resistance  - 1 x daily - 5 x weekly - 3 sets - 10 reps   PATIENT EDUCATION: Education details: Update to HEP for seated resisted exercise; pt  reports she verbalizes understanding on aquatic instructions  Person educated: Patient Education method: Explanation, Demonstration, Tactile cues, Verbal cues, and Handouts Education comprehension: verbalized understanding and returned demonstration           ------------------------------------- Note: Objective measures were completed at Evaluation unless otherwise noted.  DIAGNOSTIC FINDINGS: none recent  COGNITION: Overall cognitive status: Within functional limits for tasks assessed   SENSATION: Pt reports R>L hand numbness   COORDINATION: Alternating pronation/supination: slowed B Alternating toe tap: WNL B Finger to nose: slight dysmetria R>L   MUSCLE TONE: WNL B LEs  POSTURE: rounded shoulders and forward head  LOWER EXTREMITY ROM:     Active  Right Eval Left Eval  Hip flexion    Hip extension    Hip abduction    Hip adduction    Hip internal rotation    Hip external rotation    Knee flexion    Knee extension    Ankle dorsiflexion 15 10  Ankle plantarflexion    Ankle inversion    Ankle eversion     (Blank rows = not tested)  LOWER EXTREMITY MMT:    MMT  Right Eval Left Eval  Hip flexion 4 4+  Hip extension    Hip abduction 4+ 4+  Hip adduction 4 4  Hip internal rotation    Hip external rotation    Knee flexion 4 4  Knee extension 4 4  Ankle dorsiflexion 3+ 4  Ankle plantarflexion 4 4  Ankle inversion    Ankle eversion    (Blank rows = not tested)  GAIT: Findings: Assistive device utilized:Single point cane, Level of assistance: SBA, and Comments: slowed, foot flat B and shuffling gait   FUNCTIONAL TESTS:  5 times sit to stand: 22.04 sec without standing fully and pushing off knees 10 meter walk test: 22.59 sec (1.45 ft/sec)                                                                                            HOME EXERCISE PROGRAM: Not yet initiated  GOALS: Goals reviewed with patient? Yes  SHORT TERM GOALS: Target  date: 12/21/2023  Patient to be independent with initial HEP. Baseline: HEP initiated Goal status: MET    LONG TERM GOALS: Target date: 02/05/24  Patient to be independent with advanced HEP. Baseline: Not yet initiated  Goal status: IN PROGRESS  Patient to demonstrate B LE strength >/=4+/5.  Baseline: See above; improved in R LE, not quite met 12/25/23  Goal status: IN PROGRESS 12/25/23   Patient to demonstrate 30% improvement in foot clearance with gait.  Baseline: foot flat B; improved initially by 50% but quickly fatigues 12/25/23  Goal status: IN PROGRESS 12/25/23  Patient to demonstrate gait speed of at least 2 ft/sec in order to improve falls risk.  Baseline: 1.45 ft/sec; 1.82 ft/sec 12/25/23 Goal status: IN PROGRESS 12/25/23  Patient to demonstrate 5xSTS test in <15 sec in order to decrease risk of falls.  Baseline: 22.05; 19.34 sec pushing off knees 12/25/23  Goal status: IN PROGRESS 12/25/23    ASSESSMENT:  CLINICAL IMPRESSION: Patient arrived to session with report of L sided neck and L UE pain with  driving and overhead lifting. Denies chest pain or reg flag symptoms. Patient does report radicular symptoms of N/T and radiation down the L UE and notes hx of B UE N/T since her Chiari surgery. Session focused on LE strengthening and balance while monitoring L UE pain. Cueing provided to avoid compensations with transfers and patient demonstrate fairly good stability stepping over obstacles with some cane or UE support today. Patient tolerated session well and reported the L UE felt much better after today's activities and without complaints at end of appointment.  OBJECTIVE IMPAIRMENTS: Abnormal gait, decreased activity tolerance, decreased balance, decreased coordination, decreased endurance, difficulty walking, decreased strength, postural dysfunction, and pain.   ACTIVITY LIMITATIONS: carrying, lifting, bending, sitting, standing, squatting, sleeping, stairs,  transfers, bed mobility, bathing, toileting, dressing, reach over head, hygiene/grooming, and locomotion level  PARTICIPATION LIMITATIONS: meal prep, shopping, community activity, and church  PERSONAL FACTORS: Age, Past/current experiences, Time since onset of injury/illness/exacerbation, and 3+ comorbidities: Rectal CA, chiari malformation s/p surgery, CKD IV, depression, DM, HTN,  are also affecting patient's functional outcome.   REHAB POTENTIAL: Good  CLINICAL DECISION MAKING: Evolving/moderate complexity  EVALUATION COMPLEXITY: Moderate  PLAN:  PT FREQUENCY: 2x/week  PT DURATION: 6 weeks  PLANNED INTERVENTIONS: 97110-Therapeutic exercises, 97530- Therapeutic activity, [id number]- Neuromuscular re-education, 97535- Self Care, 02859- Manual therapy, [id number]- Gait training, [phone]- Orthotic Initial, [phone]- Canalith repositioning, [phone]- Aquatic Therapy, [phone]- Electrical stimulation (manual), Patient/Family education, Balance training, Stair training, Taping, Vestibular training, Cryotherapy, and Moist heat  PLAN FOR NEXT SESSION:  Review and progress HEP for RLE strength, balance, work on speed of movement, transfers, foot clearance/heelstrike with gait; PLEASE RECOMMEND CAREGIVER COME WITH PATIENT TO POOL (I talked to her about this but she doesn't have anyone to come with her.  She seemed to get back here a little better from a cognitive standpoint but she was still fatigued.  I just told her to take her time and sit where needed on the way out - difficulty navigating DWB alone in addition to fatigue when exiting pool; core engagement/unsupported sitting    AQUATICS: Frequency: 1 Duration: 4 weeks Special Instruction: endurance, LE strength; pt has mild R hemplegia       Louana Terrilyn Christians, PT, DPT 01/10/2024 2:46 PM  [facility] Re[facility] at [facility] [address] [place], [place] [address] Phone # [phone] Fax # [phone]

## 2024-01-10 ENCOUNTER — Ambulatory Visit: Admitting: Occupational Therapy

## 2024-01-10 ENCOUNTER — Ambulatory Visit: Admitting: Physical Therapy

## 2024-01-10 ENCOUNTER — Ambulatory Visit

## 2024-01-10 ENCOUNTER — Encounter: Payer: Self-pay | Admitting: Physical Therapy

## 2024-01-10 DIAGNOSIS — R2689 Other abnormalities of gait and mobility: Secondary | ICD-10-CM

## 2024-01-10 DIAGNOSIS — R4701 Aphasia: Secondary | ICD-10-CM

## 2024-01-10 DIAGNOSIS — I69351 Hemiplegia and hemiparesis following cerebral infarction affecting right dominant side: Secondary | ICD-10-CM

## 2024-01-10 DIAGNOSIS — R2681 Unsteadiness on feet: Secondary | ICD-10-CM

## 2024-01-10 DIAGNOSIS — M6281 Muscle weakness (generalized): Secondary | ICD-10-CM

## 2024-01-10 DIAGNOSIS — R278 Other lack of coordination: Secondary | ICD-10-CM

## 2024-01-10 NOTE — Therapy (Signed)
 OUTPATIENT OCCUPATIONAL THERAPY NEURO  Treatment Note  Patient Name: Lynn Ray MRN: [id number] DOB:1934/02/02, 89 y.o., female Today's Date: 01/10/2024  PCP: Rollene Almarie LABOR, MD REFERRING PROVIDER: Rollene Almarie LABOR, MD  END OF SESSION:  OT End of Session - 01/10/24 1517     Visit Number 2    Number of Visits 13    Date for Recertification  02/08/24    Authorization Type Auth#: [id number] , approved 13 OT visits from 12/25/2023-03/24/2024.**  [employer] 2025    OT Start Time 1448    OT Stop Time 1528    OT Time Calculation (min) 40 min           Past Medical History:  Diagnosis Date   Arthritis    Cancer (HCC) 2004   rectal cancer   Cataract    right eye   Chiari malformation    CKD (chronic kidney disease), stage IV (HCC)    Depression    Diabetes mellitus (HCC)    pre   Hypertension    Sleep apnea    no CPAP   Thyroid  disease    hyporthyroid   Past Surgical History:  Procedure Laterality Date   CATARACT EXTRACTION Bilateral    chiari malformation  2013   surgery   COLONOSCOPY     FLEXIBLE SIGMOIDOSCOPY     POLYPECTOMY     Patient Active Problem List   Diagnosis Date Noted   Sinus pause 07/06/2023   Aphasia, late effect of cerebrovascular disease 04/27/2023   Hx of completed stroke 03/16/2023   Anemia 02/23/2023   Vision changes 04/18/2022   Pre-diabetes 09/29/2020   Stage 4 chronic kidney disease (HCC) 12/22/2019   History of Chiari malformation 10/09/2017   Chronic right shoulder pain 10/09/2017   Routine general medical examination at a health care facility 07/13/2017   History of malignant neoplasm of large intestine 11/18/2010   BARRETT'S ESOPHAGUS 01/14/2008   Hypothyroidism 12/10/2007   Morbid obesity (HCC) 12/10/2007   Essential hypertension 12/10/2007   AORTIC VALVE DISORDERS 12/10/2007   COPD 12/10/2007   GERD 12/09/2007    ONSET DATE: referral date 11/26/23 (CVA 01/2023)  REFERRING DIAG: R29.898 (ICD-10-CM) -  Hand weakness  THERAPY DIAG:  Hemiplegia and hemiparesis following cerebral infarction affecting right dominant side (HCC)  Other lack of coordination  Rationale for Evaluation and Treatment: Rehabilitation  SUBJECTIVE:   SUBJECTIVE STATEMENT: Pt reports that she is having difficulty buttoning, tying shoes, handwriting, and typing.  Pt stating that her handwriting has improved some, but is still challenging especially when writing longer passages.   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 Yes: NPRS scale: 1/10 Pain location: L arm at elbow Pain description: sore, annoying Aggravating factors: positioning during sleep Relieving factors: medicated cream, massage  FALLS: Has patient fallen in last 6 months? No  LIVING ENVIRONMENT: Lives with: lives alone (has healthcare aids that stay with her overnight) Lives in: House/apartment Stairs: 2 steps to enter ; split level home; bedroom on 2nd floor - 6 steps to access Has following equipment at home: Single point cane, shower chair, Grab bars, and handicap height toilet seats  PLOF: Independent, Independent with basic ADLs, and Leisure: member of a few local organizations, enjoy crafting  PATIENT GOALS: to be able to use dominant UE more independently and  resume crafts  OBJECTIVE:  Note: Objective measures were completed at Evaluation unless otherwise noted.  HAND DOMINANCE: Right  ADLs: Overall ADLs: reports increased time to get dressed Transfers/ambulation related to ADLs: utilizing [facility] Eating: some difficulty with scooping and cutting foods Grooming: enjoys wearing makeup, requiring increased time UB Dressing: difficulty with buttons, zipping dress zippers (in back) LB Dressing: does have difficulty with tying shoes but primarily  wearing slip on shoes Toileting: Mod I Bathing: Mod I Tub Shower transfers: increased time and effort when exiting bathtub/shower Equipment: Shower seat with back and Grab bars  IADLs:  Light housekeeping: healthcare aids help with mopping, cleaning bathroom, taking out the trash Meal Prep: minimal cooking, will cook soups or bring in food Community mobility: still driving, however not driving at night time Medication management: utilizing pill box  Financial management: daughter is completing Handwriting: 100% legible and Increased time - requiring 28.19 sec to write [place].  MOBILITY STATUS: Needs Assist: Utilizing [facility] for mobility  POSTURE COMMENTS:  rounded shoulders and forward head  ACTIVITY TOLERANCE: Activity tolerance: diminished  FUNCTIONAL OUTCOME MEASURES: PSFS: 3.0   UPPER EXTREMITY ROM:    Active ROM Right eval Left eval  Shoulder flexion 88 WFL  Shoulder abduction    Shoulder adduction    Shoulder extension    Shoulder internal rotation Able to reach to back pocket, reports mild tingling at end range [facility]  Shoulder external rotation WFL (reports tingling at end range) [facility]  Elbow flexion    Elbow extension    Wrist flexion    Wrist extension    Wrist ulnar deviation    Wrist radial deviation    Wrist pronation    Wrist supination    (Blank rows = not tested)  UPPER EXTREMITY MMT:     MMT Right eval Left eval  Shoulder flexion 4+ 3-  Shoulder abduction    Shoulder adduction    Shoulder extension    Shoulder internal rotation    Shoulder external rotation    Middle trapezius    Lower trapezius    Elbow flexion    Elbow extension    Wrist flexion    Wrist extension    Wrist ulnar deviation    Wrist radial deviation    Wrist pronation    Wrist supination    (Blank rows = not tested)  HAND FUNCTION: Grip strength: Right: 29 lbs; Left: 36 lbs  COORDINATION: 9 Hole Peg test: Right: 47.15 sec; Left: 42.72 sec Box  and Blocks:  Right 35 blocks, Left 40 blocks 3 button/unbutton: 58.79 sec  SENSATION: WFL  EDEMA: NA   COGNITION: Overall cognitive status: Within functional limits for tasks assessed  VISION: Subjective report: no changes Baseline vision: Wears glasses all the time  VISION ASSESSMENT: Not tested  TREATMENT DATE:  01/10/24 Coordination: engaged in structured activities focusing on [facility] with RUE with picking up and manipulating items. picking up assorted small items with R hand and placing into container.  Pt with increased difficulty with smaller or flatter items.  Engaged in picking up coins one at a time and placing them into coin slot and then progressing to translating 5 coins in palm of hand to finger tips to place into coin slot.   Stacking/unstacking coins.  Pt with difficulty picking up coins from table top, therefore sliding off table intermittently - especially with smaller coins.  Pt demonstrating improved ease with unstacking compared to stacking of coins. Stringing beads with R hand and then L hand due to bimanual nature of task. Pt either utilizing RUE to place bead onto string or placing string into bead.   Small peg board pattern replication with focus on picking up small beads and placing into peg board.  OT then challenging pt to remove pegs one at a time and translate into palm of hand to hold up to 10 pegs in palm.      12/25/23 Educated on coordination activities to complete and how to increase challenge with smaller or more items.  Provided with demonstration and handouts.  OT educating on functional carryover of tasks as well.     PATIENT EDUCATION: Education details: coordination  Person educated: Patient Education method: Explanation, Facilities Manager, and Handouts Education comprehension: verbalized understanding and needs further  education  HOME EXERCISE PROGRAM: Coordination HEP - See pt instructions   GOALS: Goals reviewed with patient? Yes  SHORT TERM GOALS: Target date: 01/18/24  Pt will be independent in ROM and coordination HEP. Baseline: decreased ROM, strength, and coordination of dominant UE Goal status: in progress  2.  Pt will verbalize understanding of task modifications and/or potential A/E needs to increase ease, safety, and independence w/ ADLs. Baseline: difficulty with tying shoes, buttoning buttons, putting on jewelry and makeup Goal status:  in progress  3.  Pt will demonstrate improved fine motor coordination for ADLs as evidenced by decreasing 9 hole peg test score for RUE by 3 secs Baseline: 9 Hole Peg test: Right: 47.15 sec; Left: 42.72 sec Goal status:  in progress  4.  Pt will verbalizing understanding of typing programs and/or adaptive techniques to allow for increased ease with computer use (voice to text). Baseline:  Goal status:  in progress   LONG TERM GOALS: Target date: 02/08/23  Pt will demonstrate improved ease with fastening buttons as evidenced by decreasing 3 button/ unbutton time to 35 seconds or less with use of AE PRN. Baseline: 58 sec Goal status:  in progress  2.  Pt will write a short paragraph with 100% legibility and no significant decrease in quality of writing. Baseline: decreased quality from start to finish Goal status:  in progress  3.  Pt will demonstrate improved fine motor coordination for ADLs as evidenced by decreasing 9 hole peg test score for RUE by 6 secs Baseline: 9 Hole Peg test: Right: 47.15 sec; Left: 42.72 sec Goal status:  in progress  4.  Pt will demonstrate ability to retrieve a lightweight object at 110* shoulder flexion for increased ROM as needed for ADLs and IADLs. Baseline: 88* Goal status:  in progress  5.  Patient will report at least two-point increase in average PSFS score or at least three-point increase in a single  activity score indicating functionally significant improvement given minimum detectable change. Baseline: 3.0 Goal status:  in progress  ASSESSMENT:  CLINICAL IMPRESSION: Patient is a 89 y.o. female who was seen today for occupational therapy treatment for ongoing RUE weakness s/p CVA in January 2025.  Pt demonstrating good effort with coordination tasks this session, does still demonstrate mild coordination impairments due to impaired sensation and longer finger nails.  Pt reporting understanding of modifications of tasks with larger vs smaller and flatter items to allow for just right challenge.  Pt will continue to benefit from skilled occupational therapy services to address strength and coordination, ROM, pain management, balance, GM/FM control, safety awareness, introduction of compensatory strategies/AE prn, and implementation of an HEP to improve participation and safety during ADLs and IADLs.    PERFORMANCE DEFICITS: in functional skills including ADLs, IADLs, coordination, ROM, strength, pain, Fine motor control, Gross motor control, body mechanics, decreased knowledge of precautions, decreased knowledge of use of DME, and UE functional use and psychosocial skills including coping strategies, environmental adaptation, and routines and behaviors.      PLAN:  OT FREQUENCY: 2x/week  OT DURATION: 6 weeks  PLANNED INTERVENTIONS: 97168 OT Re-evaluation, 97535 self care/ADL training, 02889 therapeutic exercise, 97530 therapeutic activity, 97112 neuromuscular re-education, 97035 ultrasound, 97032 electrical stimulation (manual), passive range of motion, energy conservation, coping strategies training, patient/family education, and DME and/or AE instructions  RECOMMENDED OTHER SERVICES: NA  CONSULTED AND AGREED WITH PLAN OF CARE: Patient  PLAN FOR NEXT SESSION: review and have pt complete coordination HEP, initiate shoulder ROM HEP    Calib Wadhwa, OTR/L 01/10/2024, 3:47  PM   [facility] at [facility] [address], [address] [place], [place] [address] Phone # [phone] Fax # [phone]

## 2024-01-10 NOTE — Patient Instructions (Signed)
 For giving prayers in prayer group:   Prepare you prayer ahead of time. Write down some key points you want to mention.

## 2024-01-10 NOTE — Therapy (Signed)
 OUTPATIENT SPEECH LANGUAGE PATHOLOGY APHASIA TREATMENT   Patient Name: Lynn Ray MRN: [id number] DOB:[birth year], 89 y.o., female Today's Date: 01/10/2024  PCP: Rollene Norris, MD REFERRING PROVIDER: same as PCP  END OF SESSION:  End of Session - 01/10/24 1621     Visit Number 2    Number of Visits 9    Date for Recertification  02/22/24    Authorization Type humana m-care    SLP Start Time 1530    SLP Stop Time  1613    SLP Time Calculation (min) 43 min    Activity Tolerance Patient tolerated treatment well           Past Medical History:  Diagnosis Date   Arthritis    Cancer (HCC) 2004   rectal cancer   Cataract    right eye   Chiari malformation    CKD (chronic kidney disease), stage IV (HCC)    Depression    Diabetes mellitus (HCC)    pre   Hypertension    Sleep apnea    no CPAP   Thyroid  disease    hyporthyroid   Past Surgical History:  Procedure Laterality Date   CATARACT EXTRACTION Bilateral    chiari malformation  2013   surgery   COLONOSCOPY     FLEXIBLE SIGMOIDOSCOPY     POLYPECTOMY     Patient Active Problem List   Diagnosis Date Noted   Sinus pause 07/06/2023   Aphasia, late effect of cerebrovascular disease 04/27/2023   Hx of completed stroke 03/16/2023   Anemia 02/23/2023   Vision changes 04/18/2022   Pre-diabetes 09/29/2020   Stage 4 chronic kidney disease (HCC) 12/22/2019   History of Chiari malformation 10/09/2017   Chronic right shoulder pain 10/09/2017   Routine general medical examination at a health care facility 07/13/2017   History of malignant neoplasm of large intestine 11/18/2010   BARRETT'S ESOPHAGUS 01/14/2008   Hypothyroidism 12/10/2007   Morbid obesity (HCC) 12/10/2007   Essential hypertension 12/10/2007   AORTIC VALVE DISORDERS 12/10/2007   COPD 12/10/2007   GERD 12/09/2007    ONSET DATE: 02/22/23   REFERRING DIAG: Aphasia  THERAPY DIAG:  Aphasia  Rationale for Evaluation and Treatment:  Rehabilitation  SUBJECTIVE:   SUBJECTIVE STATEMENT: My talking is not like it was prior to the stroke.  Pt accompanied by: self  PERTINENT HISTORY: Rectal CA, chiari malformation s/p surgery, CKD IV, depression, DM, HTN,  PAIN:  Are you having pain? Yes: NPRS scale: 5/10 Pain location: bil knees Pain description: soreness Aggravating factors: climbing stairs Relieving factors: resting  FALLS: Has patient fallen in last 6 months?  Number of falls: 1; See PT eval.  LIVING ENVIRONMENT: Lives with: lives alone has personal care attendant, and her daughter sometimes stays with her nights Lives in: House/apartment  PLOF:  Level of assistance: Independent with ADLs, Comment: assistance with cleaning and some other more rigorous activities Employment: Retired  PATIENT GOALS: Improve ability to speak like she used to prior to CVA  OBJECTIVE:  Note: Objective measures were completed at Evaluation unless otherwise noted.  DIAGNOSTIC FINDINGS:  MRI- 02/23/23 IMPRESSION: 1. Patchy acute ischemic infarct involving the parasagittal left frontal lobe. Minimal associated petechial blood products without hemorrhagic transformation or significant mass effect. 2. Additional subcentimeter acute to early subacute ischemic nonhemorrhagic infarcts involving the subcortical right frontal lobe and superior left thalamus. 3. Underlying age-related cerebral atrophy with chronic microvascular ischemic disease, with a few scattered remote lacunar infarcts about the hemispheric cerebral white matter  and cerebellum.  ST Discharge from CIR 03/07/23 Clinical Impression/Discharge Summary:  Pt has made good gains and has met 2 of 3 LTG's this admission due to improved cognition. Pt is currently an overall supervisionA for cognitive tasks and requires min cues for utilization of word finding compensatory strategies during conversation. Pt/family education complete and pt will discharge home with  supervision from friends/family/etc. Pt would benefit from f/u ST services to maximize communication in order to maximize functional independence.      COGNITION: Overall cognitive status: Within functional limits for tasks assessed; SLP probed the areas of memory, problem solving, attention, as well as anticipatory awareness via diagnostic conversation. Pt has PCA as well as family attending to pt.  AUDITORY COMPREHENSION: Overall auditory comprehension: Appears intact YES/NO questions: Appears intact Following directions: Appears intact Conversation: Moderately Complex  READING COMPREHENSION: Intact  EXPRESSION: verbal  VERBAL EXPRESSION: Level of generative/spontaneous verbalization: conversation Automatic speech: month of year: intact  Repetition: Appears intact Naming: Confrontation: 76% (23/30) Pragmatics: Appears intact Comments: In mod complex/complex conversation re: her cancer diagnosis and treatment, and reason for seeing Dr. Onesimo recently, pt demonstrated some anomia however pt was functional in her verbal expression using compensations she is spontaneously using.  Interfering components: none Effective technique: pt is using extra time for generating functional language  WRITTEN EXPRESSION: Dominant hand: right Written expression: Appears intact; pt states her hand tires after approx one sentence - SLP observed this and legibility decr'd. SLP notified pt's OT.  MOTOR SPEECH: Overall motor speech: Appears intact  ORAL MOTOR EXAMINATION: Overall status: WFL  STANDARDIZED ASSESSMENTS: [place] NAMING TEST: (BNT-2); 23/30 on ODDS administration of BNT-2. This falls outside the normal range; See impression below for further description.  PATIENT REPORTED OUTCOME MEASURES (PROM): Communication Participation Item Bank: (CPIB); Pt scored herself 18/30 with lower scores indicating greater effect of pt's deficits on her communication participation.                                                                                                                              TREATMENT DATE:   01/10/24: Pt notes that she experiences word-finding difficulties during prayer group meetings, when she gives a prayer. Pt experiences anomic pauses during prayers, which is different from prior to stroke. SLP introduced Primary School Teacher (SFA) as a strategy for optimizing word-retrieval. SLP duided pt through an example of SFA using a picture of an object. After guided practice, pt provided 6+ descriptions in 7 Trials of SFA given frequent mod to min A. SLP educated pt in using personally-relevant words for SFA training (prayer-related terminology). Pt is agreeable and open to finding relevant words for helping with verbal prayer generation. SLP instructed pt in planning prayers before prayer group meetings to maximize confidence and minimize anomic pauses during prayer generation. SLP provided pt with SFA homework with up to 15 different objects to describe. SLP also encouraged pt to find personally relevant words related to prayer and  her religion to utilize in future practice of word-finding strategies. Plan is to continue word-finding strategies for maximizing pt participation in social ADLs and to increase QOL for communication.  12/25/23: N/A. Pt needs homework tracking sheet in first ST session.  PATIENT EDUCATION: Education details: eval results, possible goals, not-as-robust prognosis for improvement based upon pt age, and time post onset but SLP believes pt will make improvement to some degree Person educated: Patient Education method: Explanation Education comprehension: verbalized understanding   GOALS: Goals reviewed with patient? Yes  SHORT TERM GOALS: Target date: 01/25/24  Pt will improve CPIB to at least 21 at visit #5 Baseline: Goal status: ONGOING  2.  Pt will perform naming tasks using compensations PRN with 90% success in 2 sessions Baseline:   Goal status: ONGOING  3.  Pt will complete homework for anomia/aphasia at least 3 days/week before 01/25/24, as tallied by tracking sheet Baseline:  Goal status: ONGOING  4.  Pt will participate in mod complex conversation for 5 minutes with <4 anomic pauses Baseline:  Goal status: ONGOING   LONG TERM GOALS: Target date: 02/22/24  At visit #9, pt will improve CPIB score to higher than the score noted for STG #1 Baseline:  Goal status: ONGOING  2.  Pt will complete homework for anomia/aphasia at least 3 days/week after 01/25/24, as tallied by tracking sheet Baseline:  Goal status: ONGOING  3.  Pt will participate in mod complex-complex conversation for total 10 minutes with no more than 5 anomic pauses, using compensations PRN Baseline:  Goal status: ONGOING   ASSESSMENT:  CLINICAL IMPRESSION: Patient is a 89 y.o. F who was seen today for assessment of speech and language following a CVA in January 2025. Today she scored 23/30 on the odds administration of the [facility], which is below WNL (mean=27.13, standard deviation=2.06). She tells SLP that she has a lot of difficulty in conversations: with fast-moving topics, with strangers, when she needs to persuade a family member or friend to see a different point of view, or conversations needing to communicate something quickly. She is highly motivated for positive change.   OBJECTIVE IMPAIRMENTS: include aphasia. These impairments are limiting patient from effectively communicating at home and in community. Factors affecting potential to achieve goals and functional outcome are family/community support (possibly - pt arrived alone today). Patient will benefit from skilled SLP services to address above impairments and improve overall function.  REHAB POTENTIAL: Good  PLAN:  SLP FREQUENCY: 1x/week  SLP DURATION: 8 weeks  PLANNED INTERVENTIONS: Language facilitation, Environmental controls, Cueing hierachy,  Internal/external aids, Functional tasks, Multimodal communication approach, SLP instruction and feedback, Compensatory strategies, Patient/family education, and [id number] Treatment of speech (30 or 45 min)     [employer], CF-SLP 01/10/2024, 4:22 PM  Referring diagnosis:  Aphasia R47.01 Treatment diagnosis (if different than referring diagnosis): same Date Symptoms Began: Jan 2025 # of Visits requested: 12  Time period for Authorization: 12/25/23 to 02/22/24  What was this (referring dx) caused by? []  Surgery []  Fall []  Ongoing issue []  Arthritis [x]  Other: ___CVA_________  Laterality: []  Rt   NA []  Lt []  Both  Functional Tool & Score: Boston Naming Test 23/20  Check all possible CPT codes:     See Planned Interventions listed in the Plan section of the Evaluation.     If [employer]: Choose 10 or less codes  If [employer]: Modalities are not covered  If [employer]: Check allowed ICD code combinations  If [facility] Plan or Cigna: Cognitive training not covered

## 2024-01-15 ENCOUNTER — Ambulatory Visit: Admitting: Rehabilitation

## 2024-01-16 ENCOUNTER — Ambulatory Visit: Admitting: Occupational Therapy

## 2024-01-16 DIAGNOSIS — R2689 Other abnormalities of gait and mobility: Secondary | ICD-10-CM | POA: Diagnosis not present

## 2024-01-16 DIAGNOSIS — R278 Other lack of coordination: Secondary | ICD-10-CM

## 2024-01-16 DIAGNOSIS — I69351 Hemiplegia and hemiparesis following cerebral infarction affecting right dominant side: Secondary | ICD-10-CM

## 2024-01-16 DIAGNOSIS — M6281 Muscle weakness (generalized): Secondary | ICD-10-CM

## 2024-01-16 NOTE — Therapy (Signed)
 OUTPATIENT OCCUPATIONAL THERAPY NEURO  Treatment Note  Patient Name: Lynn Ray MRN: [id number] DOB:1934/09/10, 89 y.o., female Today's Date: 01/16/2024  PCP: Rollene Almarie LABOR, MD REFERRING PROVIDER: Rollene Almarie LABOR, MD  END OF SESSION:  OT End of Session - 01/16/24 1458     Visit Number 3    Number of Visits 13    Date for Recertification  02/08/24    Authorization Type Auth#: [id number] , approved 13 OT visits from 12/25/2023-03/24/2024.**  [employer] Medicare 2025    OT Start Time 1456   pt arrival time   OT Stop Time 1530    OT Time Calculation (min) 34 min            Past Medical History:  Diagnosis Date   Arthritis    Cancer (HCC) 2004   rectal cancer   Cataract    right eye   Chiari malformation    CKD (chronic kidney disease), stage IV (HCC)    Depression    Diabetes mellitus (HCC)    pre   Hypertension    Sleep apnea    no CPAP   Thyroid  disease    hyporthyroid   Past Surgical History:  Procedure Laterality Date   CATARACT EXTRACTION Bilateral    chiari malformation  2013   surgery   COLONOSCOPY     FLEXIBLE SIGMOIDOSCOPY     POLYPECTOMY     Patient Active Problem List   Diagnosis Date Noted   Sinus pause 07/06/2023   Aphasia, late effect of cerebrovascular disease 04/27/2023   Hx of completed stroke 03/16/2023   Anemia 02/23/2023   Vision changes 04/18/2022   Pre-diabetes 09/29/2020   Stage 4 chronic kidney disease (HCC) 12/22/2019   History of Chiari malformation 10/09/2017   Chronic right shoulder pain 10/09/2017   Routine general medical examination at a health care facility 07/13/2017   History of malignant neoplasm of large intestine 11/18/2010   BARRETT'S ESOPHAGUS 01/14/2008   Hypothyroidism 12/10/2007   Morbid obesity (HCC) 12/10/2007   Essential hypertension 12/10/2007   AORTIC VALVE DISORDERS 12/10/2007   COPD 12/10/2007   GERD 12/09/2007    ONSET DATE: referral date 11/26/23 (CVA 01/2023)  REFERRING DIAG:  R29.898 (ICD-10-CM) - Hand weakness  THERAPY DIAG:  Hemiplegia and hemiparesis following cerebral infarction affecting right dominant side (HCC)  Other lack of coordination  Muscle weakness (generalized)  Rationale for Evaluation and Treatment: Rehabilitation  SUBJECTIVE:   SUBJECTIVE STATEMENT: Pt reporting that she came early to her appt as she got an alert at it was at 2:00 but then was not until 2:45 so she ran an errand and got too far away. 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 No  FALLS: Has patient fallen in last 6 months? No  LIVING ENVIRONMENT: Lives with: lives alone (has healthcare aids that stay with her overnight) Lives in: House/apartment Stairs: 2 steps to enter ; split level home; bedroom on 2nd floor - 6 steps to access Has following equipment at home: Single point cane, shower chair, Grab bars, and handicap height toilet seats  PLOF: Independent, Independent with basic ADLs, and Leisure: member of a few local organizations, enjoy crafting  PATIENT GOALS: to be able to use dominant UE more independently and resume crafts  OBJECTIVE:  Note: Objective measures were completed at Evaluation  unless otherwise noted.  HAND DOMINANCE: Right  ADLs: Overall ADLs: reports increased time to get dressed Transfers/ambulation related to ADLs: utilizing [facility] Eating: some difficulty with scooping and cutting foods Grooming: enjoys wearing makeup, requiring increased time UB Dressing: difficulty with buttons, zipping dress zippers (in back) LB Dressing: does have difficulty with tying shoes but primarily wearing slip on shoes Toileting: Mod I Bathing: Mod I Tub Shower transfers: increased time and effort when exiting bathtub/shower Equipment: Shower seat with back and Grab  bars  IADLs:  Light housekeeping: healthcare aids help with mopping, cleaning bathroom, taking out the trash Meal Prep: minimal cooking, will cook soups or bring in food Community mobility: still driving, however not driving at night time Medication management: utilizing pill box  Financial management: daughter is completing Handwriting: 100% legible and Increased time - requiring 28.19 sec to write [place]  MOBILITY STATUS: Needs Assist: Utilizing [facility] for mobility  POSTURE COMMENTS:  rounded shoulders and forward head  ACTIVITY TOLERANCE: Activity tolerance: diminished  FUNCTIONAL OUTCOME MEASURES: PSFS: 3.0   UPPER EXTREMITY ROM:    Active ROM Right eval Left eval  Shoulder flexion 88 WFL  Shoulder abduction    Shoulder adduction    Shoulder extension    Shoulder internal rotation Able to reach to back pocket, reports mild tingling at end range [facility]  Shoulder external rotation WFL (reports tingling at end range) [facility]  Elbow flexion    Elbow extension    Wrist flexion    Wrist extension    Wrist ulnar deviation    Wrist radial deviation    Wrist pronation    Wrist supination    (Blank rows = not tested)  UPPER EXTREMITY MMT:     MMT Right eval Left eval  Shoulder flexion 4+ 3-  Shoulder abduction    Shoulder adduction    Shoulder extension    Shoulder internal rotation    Shoulder external rotation    Middle trapezius    Lower trapezius    Elbow flexion    Elbow extension    Wrist flexion    Wrist extension    Wrist ulnar deviation    Wrist radial deviation    Wrist pronation    Wrist supination    (Blank rows = not tested)  HAND FUNCTION: Grip strength: Right: 29 lbs; Left: 36 lbs  COORDINATION: 9 Hole Peg test: Right: 47.15 sec; Left: 42.72 sec Box and Blocks:  Right 35 blocks, Left 40 blocks 3 button/unbutton: 58.79 sec  SENSATION: WFL  EDEMA: NA   COGNITION: Overall cognitive status: Within functional limits  for tasks assessed  VISION: Subjective report: no changes Baseline vision: Wears glasses all the time  VISION ASSESSMENT: Not tested                                                                                                                             TREATMENT DATE:  01/16/24 Pipe tree:  Engaged in pipe tree puzzle in sitting with focus on coordination and manipulation of pieces and increased shoulder flexion.  Pt demonstrating good functional use of RUE when picking up and manipulating pieces.  Pt demonstrating good motor control with increased flexion. Shoulder ROM: OT providing verbal cues and demonstration.  Pt utilizing personal cane as dowel.  Completed each 2 sets of 10. - Shoulder Flexion Overhead with Dowel   - Seated Shoulder Flexion AAROM with Dowel   - Seated Shoulder Abduction AAROM with Dowel   - Seated Shoulder External Rotation AAROM with Cane and Hand in Neutral  - Seated Scapular Retraction    01/10/24 Coordination: engaged in structured activities focusing on [facility] with RUE with picking up and manipulating items. picking up assorted small items with R hand and placing into container.  Pt with increased difficulty with smaller or flatter items.  Engaged in picking up coins one at a time and placing them into coin slot and then progressing to translating 5 coins in palm of hand to finger tips to place into coin slot.   Stacking/unstacking coins.  Pt with difficulty picking up coins from table top, therefore sliding off table intermittently - especially with smaller coins.  Pt demonstrating improved ease with unstacking compared to stacking of coins. Stringing beads with R hand and then L hand due to bimanual nature of task. Pt either utilizing RUE to place bead onto string or placing string into bead.   Small peg board pattern replication with focus on picking up small beads and placing into peg board.  OT then challenging pt to remove pegs one at a time and translate  into palm of hand to hold up to 10 pegs in palm.      12/25/23 Educated on coordination activities to complete and how to increase challenge with smaller or more items.  Provided with demonstration and handouts.  OT educating on functional carryover of tasks as well.     PATIENT EDUCATION: Education details: coordination, Shoulder ROM Person educated: Patient Education method: Explanation, Demonstration, and Handouts Education comprehension: verbalized understanding and needs further education  HOME EXERCISE PROGRAM: Coordination HEP - See pt instructions  Access Code: [email or web address] URL: [email or web address] Date: 01/16/2024 Prepared by: [facility] - Outpatient  Rehab - [facility]  Exercises - Seated Shoulder Blade Squeeze  - 2 x daily - 2 sets - 10 reps - Shoulder Flexion Overhead with Dowel  - 2 x daily - 2 sets - 10 reps - Seated Shoulder Flexion AAROM with Dowel  - 2 x daily - 2 sets - 10 reps - Seated Shoulder Abduction AAROM with Dowel  - 2 x daily - 2 sets - 10 reps - Seated Shoulder External Rotation AAROM with Cane and Hand in Neutral  - 2 x daily - 2 sets - 10 reps   GOALS: Goals reviewed with patient? Yes  SHORT TERM GOALS: Target date: 01/18/24  Pt will be independent in ROM and coordination HEP. Baseline: decreased ROM, strength, and coordination of dominant UE Goal status: in progress  2.  Pt will verbalize understanding of task modifications and/or potential A/E needs to increase ease, safety, and independence w/ ADLs. Baseline: difficulty with tying shoes, buttoning buttons, putting on jewelry and makeup Goal status:  in progress  3.  Pt will demonstrate improved fine motor coordination for ADLs as evidenced by decreasing 9 hole peg test score for RUE by 3 secs Baseline: 9 Hole Peg test: Right: 47.15 sec; Left: 42.72 sec Goal status:  in progress  4.  Pt will verbalizing understanding of typing programs and/or adaptive techniques to allow  for increased ease with computer use (voice to text). Baseline:  Goal status:  in progress   LONG TERM GOALS: Target date: 02/08/23  Pt will demonstrate improved ease with fastening buttons as evidenced by decreasing 3 button/ unbutton time to 35 seconds or less with use of AE PRN. Baseline: 58 sec Goal status:  in progress  2.  Pt will write a short paragraph with 100% legibility and no significant decrease in quality of writing. Baseline: decreased quality from start to finish Goal status:  in progress  3.  Pt will demonstrate improved fine motor coordination for ADLs as evidenced by decreasing 9 hole peg test score for RUE by 6 secs Baseline: 9 Hole Peg test: Right: 47.15 sec; Left: 42.72 sec Goal status:  in progress  4.  Pt will demonstrate ability to retrieve a lightweight object at 110* shoulder flexion for increased ROM as needed for ADLs and IADLs. Baseline: 88* Goal status:  in progress  5.  Patient will report at least two-point increase in average PSFS score or at least three-point increase in a single activity score indicating functionally significant improvement given minimum detectable change. Baseline: 3.0 Goal status:  in progress  ASSESSMENT:  CLINICAL IMPRESSION: Patient is a 89 y.o. female who was seen today for occupational therapy treatment for ongoing RUE weakness s/p CVA in January 2025.  Pt demonstrating good effort with coordination tasks this session, does still demonstrate mild coordination impairments due to impaired sensation and longer finger nails.  Pt tolerating shoulder ROM exercises this session, benefiting from visual cues and demonstration and use of SPC for dowel for improved ROM.  Pt will continue to benefit from skilled occupational therapy services to address strength and coordination, ROM, pain management, balance, GM/FM control, safety awareness, introduction of compensatory strategies/AE prn, and implementation of an HEP to improve participation  and safety during ADLs and IADLs.    PERFORMANCE DEFICITS: in functional skills including ADLs, IADLs, coordination, ROM, strength, pain, Fine motor control, Gross motor control, body mechanics, decreased knowledge of precautions, decreased knowledge of use of DME, and UE functional use and psychosocial skills including coping strategies, environmental adaptation, and routines and behaviors.      PLAN:  OT FREQUENCY: 2x/week  OT DURATION: 6 weeks  PLANNED INTERVENTIONS: 97168 OT Re-evaluation, 97535 self care/ADL training, 02889 therapeutic exercise, 97530 therapeutic activity, 97112 neuromuscular re-education, 97035 ultrasound, 97032 electrical stimulation (manual), passive range of motion, energy conservation, coping strategies training, patient/family education, and DME and/or AE instructions  RECOMMENDED OTHER SERVICES: NA  CONSULTED AND AGREED WITH PLAN OF CARE: Patient  PLAN FOR NEXT SESSION: review and have pt complete coordination HEP, incorporate shoulder, initiate handwriting and typing practice    KAYLENE DOMINO, OTR/L 01/16/2024, 2:59 PM   [facility] at [facility] [address] [place], [place] [address] Phone # [phone] Fax # [phone]

## 2024-01-18 ENCOUNTER — Ambulatory Visit: Admitting: Occupational Therapy

## 2024-01-18 ENCOUNTER — Ambulatory Visit

## 2024-01-18 DIAGNOSIS — I69351 Hemiplegia and hemiparesis following cerebral infarction affecting right dominant side: Secondary | ICD-10-CM

## 2024-01-18 DIAGNOSIS — R4701 Aphasia: Secondary | ICD-10-CM

## 2024-01-18 DIAGNOSIS — R2689 Other abnormalities of gait and mobility: Secondary | ICD-10-CM | POA: Diagnosis not present

## 2024-01-18 DIAGNOSIS — R278 Other lack of coordination: Secondary | ICD-10-CM

## 2024-01-18 DIAGNOSIS — M6281 Muscle weakness (generalized): Secondary | ICD-10-CM

## 2024-01-18 NOTE — Patient Instructions (Signed)
 Typing Websites:  [email or web address]

## 2024-01-18 NOTE — Therapy (Signed)
 " OUTPATIENT SPEECH LANGUAGE PATHOLOGY APHASIA TREATMENT   Patient Name: Lynn Ray MRN: [id number] DOB:[birth year], 89 y.o., female Today's Date: 01/18/2024  PCP: Rollene Norris, MD REFERRING PROVIDER: same as PCP  END OF SESSION:  End of Session - 01/18/24 1141     Visit Number 3    Number of Visits 9    Date for Recertification  02/22/24    Authorization Type humana m-care    SLP Start Time 1020    SLP Stop Time  1100    SLP Time Calculation (min) 40 min    Activity Tolerance Patient tolerated treatment well           Past Medical History:  Diagnosis Date   Arthritis    Cancer (HCC) 2004   rectal cancer   Cataract    right eye   Chiari malformation    CKD (chronic kidney disease), stage IV (HCC)    Depression    Diabetes mellitus (HCC)    pre   Hypertension    Sleep apnea    no CPAP   Thyroid  disease    hyporthyroid   Past Surgical History:  Procedure Laterality Date   CATARACT EXTRACTION Bilateral    chiari malformation  2013   surgery   COLONOSCOPY     FLEXIBLE SIGMOIDOSCOPY     POLYPECTOMY     Patient Active Problem List   Diagnosis Date Noted   Sinus pause 07/06/2023   Aphasia, late effect of cerebrovascular disease 04/27/2023   Hx of completed stroke 03/16/2023   Anemia 02/23/2023   Vision changes 04/18/2022   Pre-diabetes 09/29/2020   Stage 4 chronic kidney disease (HCC) 12/22/2019   History of Chiari malformation 10/09/2017   Chronic right shoulder pain 10/09/2017   Routine general medical examination at a health care facility 07/13/2017   History of malignant neoplasm of large intestine 11/18/2010   BARRETT'S ESOPHAGUS 01/14/2008   Hypothyroidism 12/10/2007   Morbid obesity (HCC) 12/10/2007   Essential hypertension 12/10/2007   AORTIC VALVE DISORDERS 12/10/2007   COPD 12/10/2007   GERD 12/09/2007    ONSET DATE: 02/22/23   REFERRING DIAG: Aphasia  THERAPY DIAG:  No diagnosis found.  Rationale for Evaluation and  Treatment: Rehabilitation  SUBJECTIVE:   SUBJECTIVE STATEMENT: My talking is not like it was prior to the stroke.  Pt accompanied by: self  PERTINENT HISTORY: Rectal CA, chiari malformation s/p surgery, CKD IV, depression, DM, HTN,  PAIN:  Are you having pain? Yes: NPRS scale: 5/10 Pain location: bil knees Pain description: soreness Aggravating factors: climbing stairs Relieving factors: resting  FALLS: Has patient fallen in last 6 months?  Number of falls: 1; See PT eval.  LIVING ENVIRONMENT: Lives with: lives alone has personal care attendant, and her daughter sometimes stays with her nights Lives in: House/apartment  PLOF:  Level of assistance: Independent with ADLs, Comment: assistance with cleaning and some other more rigorous activities Employment: Retired  PATIENT GOALS: Improve ability to speak like she used to prior to CVA  OBJECTIVE:  Note: Objective measures were completed at Evaluation unless otherwise noted.  DIAGNOSTIC FINDINGS:  MRI- 02/23/23 IMPRESSION: 1. Patchy acute ischemic infarct involving the parasagittal left frontal lobe. Minimal associated petechial blood products without hemorrhagic transformation or significant mass effect. 2. Additional subcentimeter acute to early subacute ischemic nonhemorrhagic infarcts involving the subcortical right frontal lobe and superior left thalamus. 3. Underlying age-related cerebral atrophy with chronic microvascular ischemic disease, with a few scattered remote lacunar infarcts about the hemispheric  cerebral white matter and cerebellum.  ST Discharge from CIR 03/07/23 Clinical Impression/Discharge Summary:  Pt has made good gains and has met 2 of 3 LTG's this admission due to improved cognition. Pt is currently an overall supervisionA for cognitive tasks and requires min cues for utilization of word finding compensatory strategies during conversation. Pt/family education complete and pt will discharge home with  supervision from friends/family/etc. Pt would benefit from f/u ST services to maximize communication in order to maximize functional independence.      PATIENT REPORTED OUTCOME MEASURES (PROM): Communication Participation Item Bank: (CPIB); Pt scored herself 18/30 with lower scores indicating greater effect of pt's deficits on her communication participation.                                                                                                                             TREATMENT DATE:   01/18/24: Pt used SLP's suggestion from previous session and wrote out some words for prayer group. SLP also encouraged pt to bring her prayer journal to prayer group. Today SLP reiterated pt complete SFA modified for prayer terms; pt told SLP she would complete this. SLP assisted pt with homework she could not completed due to complexity, and she req'd min-mod cues usually with these entries. SLP also provided extra homework for anomia and sentence composition.  Pt was concerned about her family not understanding her aphasia so SLP offered a printout from [employer].   01/10/24: Pt notes that she experiences word-finding difficulties during prayer group meetings, when she gives a prayer. Pt experiences anomic pauses during prayers, which is different from prior to stroke. SLP introduced Primary School Teacher (SFA) as a strategy for optimizing word-retrieval. SLP duided pt through an example of SFA using a picture of an object. After guided practice, pt provided 6+ descriptions in 7 Trials of SFA given frequent mod to min A. SLP educated pt in using personally-relevant words for SFA training (prayer-related terminology). Pt is agreeable and open to finding relevant words for helping with verbal prayer generation. SLP instructed pt in planning prayers before prayer group meetings to maximize confidence and minimize anomic pauses during prayer generation. SLP provided pt with SFA homework  with up to 15 different objects to describe. SLP also encouraged pt to find personally relevant words related to prayer and her religion to utilize in future practice of word-finding strategies. Plan is to continue word-finding strategies for maximizing pt participation in social ADLs and to increase QOL for communication.  12/25/23: N/A. Pt needs homework tracking sheet in first ST session.  PATIENT EDUCATION: Education details: eval results, possible goals, not-as-robust prognosis for improvement based upon pt age, and time post onset but SLP believes pt will make improvement to some degree Person educated: Patient Education method: Explanation Education comprehension: verbalized understanding   GOALS: Goals reviewed with patient? Yes  SHORT TERM GOALS: Target date:  01/28/24 (due to visit #)  Pt will improve CPIB to at  least 21 at visit #5 Baseline: Goal status: ONGOING  2.  Pt will perform naming tasks using compensations PRN with 90% success in 2 sessions Baseline:  Goal status: ONGOING  3.  Pt will demo evidence of completing homework for anomia/aphasia between three sessions Baseline: 01/18/24 Goal status: ONGOING  4.  Pt will participate in mod complex conversation for 5 minutes with <4 anomic pauses Baseline:  Goal status: ONGOING   LONG TERM GOALS: Target date: 02/22/24  At visit #9, pt will improve CPIB score to higher than the score noted for STG #1 Baseline:  Goal status: ONGOING  2.  Pt will complete homework for anomia/aphasia at least 3 days/week after 01/25/24, as tallied by tracking sheet Baseline:  Goal status: ONGOING  3.  Pt will participate in mod complex-complex conversation for total 10 minutes with no more than 5 anomic pauses, using compensations PRN Baseline:  Goal status: ONGOING   ASSESSMENT:  CLINICAL IMPRESSION: Modified STG for homework. Patient is a 89 y.o. F who was seen today for treatment of speech and language following a CVA in  January 2025. See treatment date above for today's date for further details on today's session. On eval date she scored 23/30 on the odds administration of the [employer], which is below WNL (mean=27.13, standard deviation=2.06). She tells SLP that she has a lot of difficulty in conversations: with fast-moving topics, with strangers, when she needs to persuade a family member or friend to see a different point of view, or conversations needing to communicate something quickly. She is highly motivated for positive change.   OBJECTIVE IMPAIRMENTS: include aphasia. These impairments are limiting patient from effectively communicating at home and in community. Factors affecting potential to achieve goals and functional outcome are family/community support (possibly - pt arrived alone today). Patient will benefit from skilled SLP services to address above impairments and improve overall function.  REHAB POTENTIAL: Good  PLAN:  SLP FREQUENCY: 1x/week  SLP DURATION: 8 weeks  PLANNED INTERVENTIONS: Language facilitation, Environmental controls, Cueing hierachy, Internal/external aids, Functional tasks, Multimodal communication approach, SLP instruction and feedback, Compensatory strategies, Patient/family education, and [id number] Treatment of speech (30 or 45 min)     [employer], CF-SLP 01/18/2024, 11:42 AM  Referring diagnosis:  Aphasia R47.01 Treatment diagnosis (if different than referring diagnosis): same Date Symptoms Began: Jan 2025 # of Visits requested: 12  Time period for Authorization: 12/25/23 to 02/22/24  What was this (referring dx) caused by? []  Surgery []  Fall []  Ongoing issue []  Arthritis [x]  Other: ___CVA_________  Laterality: []  Rt   NA []  Lt []  Both  Functional Tool & Score: Boston Naming Test 23/20  Check all possible CPT codes:     See Planned Interventions listed in the Plan section of the Evaluation.     If [employer]: Choose 10 or less codes  If  [employer] Medicaid: Modalities are not covered  If [employer]: Check allowed ICD code combinations   If [facility] Plan or Cigna: Cognitive training not covered     "

## 2024-01-18 NOTE — Therapy (Signed)
 " OUTPATIENT OCCUPATIONAL THERAPY NEURO  Treatment Note  Patient Name: Lynn Ray MRN: [id number] DOB:August 03, 1934, 89 y.o., female Today's Date: 01/18/2024  PCP: Rollene Almarie LABOR, MD REFERRING PROVIDER: Rollene Almarie LABOR, MD  END OF SESSION:  OT End of Session - 01/18/24 1119     Visit Number 4    Number of Visits 13    Date for Recertification  02/08/24    Authorization Type Auth#: [id number] , approved 13 OT visits from 12/25/2023-03/24/2024.**  [employer] Medicare 2025    OT Start Time 1102    OT Stop Time 1144    OT Time Calculation (min) 42 min             Past Medical History:  Diagnosis Date   Arthritis    Cancer (HCC) 2004   rectal cancer   Cataract    right eye   Chiari malformation    CKD (chronic kidney disease), stage IV (HCC)    Depression    Diabetes mellitus (HCC)    pre   Hypertension    Sleep apnea    no CPAP   Thyroid  disease    hyporthyroid   Past Surgical History:  Procedure Laterality Date   CATARACT EXTRACTION Bilateral    chiari malformation  2013   surgery   COLONOSCOPY     FLEXIBLE SIGMOIDOSCOPY     POLYPECTOMY     Patient Active Problem List   Diagnosis Date Noted   Sinus pause 07/06/2023   Aphasia, late effect of cerebrovascular disease 04/27/2023   Hx of completed stroke 03/16/2023   Anemia 02/23/2023   Vision changes 04/18/2022   Pre-diabetes 09/29/2020   Stage 4 chronic kidney disease (HCC) 12/22/2019   History of Chiari malformation 10/09/2017   Chronic right shoulder pain 10/09/2017   Routine general medical examination at a health care facility 07/13/2017   History of malignant neoplasm of large intestine 11/18/2010   BARRETT'S ESOPHAGUS 01/14/2008   Hypothyroidism 12/10/2007   Morbid obesity (HCC) 12/10/2007   Essential hypertension 12/10/2007   AORTIC VALVE DISORDERS 12/10/2007   COPD 12/10/2007   GERD 12/09/2007    ONSET DATE: referral date 11/26/23 (CVA 01/2023)  REFERRING DIAG: R29.898  (ICD-10-CM) - Hand weakness  THERAPY DIAG:  Hemiplegia and hemiparesis following cerebral infarction affecting right dominant side (HCC)  Other lack of coordination  Muscle weakness (generalized)  Rationale for Evaluation and Treatment: Rehabilitation  SUBJECTIVE:   SUBJECTIVE STATEMENT: Pt reports that she has had a busy week.  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 No - but does report that she has intermittent pain in her R thumb (arthritis)  FALLS: Has patient fallen in last 6 months? No  LIVING ENVIRONMENT: Lives with: lives alone (has healthcare aids that stay with her overnight) Lives in: House/apartment Stairs: 2 steps to enter ; split level home; bedroom on 2nd floor - 6 steps to access Has following equipment at home: Single point cane, shower chair, Grab bars, and handicap height toilet seats  PLOF: Independent, Independent with basic ADLs, and Leisure: member of a few local organizations, enjoy crafting  PATIENT GOALS: to be able to use dominant UE more independently and resume crafts  OBJECTIVE:  Note: Objective measures were completed at Evaluation unless otherwise noted.  HAND DOMINANCE: Right  ADLs: Overall ADLs: reports increased  time to get dressed Transfers/ambulation related to ADLs: utilizing [facility] Eating: some difficulty with scooping and cutting foods Grooming: enjoys wearing makeup, requiring increased time UB Dressing: difficulty with buttons, zipping dress zippers (in back) LB Dressing: does have difficulty with tying shoes but primarily wearing slip on shoes Toileting: Mod I Bathing: Mod I Tub Shower transfers: increased time and effort when exiting bathtub/shower Equipment: Shower seat with back and Grab bars  IADLs:  Light housekeeping: healthcare  aids help with mopping, cleaning bathroom, taking out the trash Meal Prep: minimal cooking, will cook soups or bring in food Community mobility: still driving, however not driving at night time Medication management: utilizing pill box  Financial management: daughter is completing Handwriting: 100% legible and Increased time - requiring 28.19 sec to write [place]  MOBILITY STATUS: Needs Assist: Utilizing [facility] for mobility  POSTURE COMMENTS:  rounded shoulders and forward head  ACTIVITY TOLERANCE: Activity tolerance: diminished  FUNCTIONAL OUTCOME MEASURES: PSFS: 3.0   UPPER EXTREMITY ROM:    Active ROM Right eval Left eval  Shoulder flexion 88 WFL  Shoulder abduction    Shoulder adduction    Shoulder extension    Shoulder internal rotation Able to reach to back pocket, reports mild tingling at end range [facility]  Shoulder external rotation WFL (reports tingling at end range) [facility]  Elbow flexion    Elbow extension    Wrist flexion    Wrist extension    Wrist ulnar deviation    Wrist radial deviation    Wrist pronation    Wrist supination    (Blank rows = not tested)  UPPER EXTREMITY MMT:     MMT Right eval Left eval  Shoulder flexion 4+ 3-  Shoulder abduction    Shoulder adduction    Shoulder extension    Shoulder internal rotation    Shoulder external rotation    Middle trapezius    Lower trapezius    Elbow flexion    Elbow extension    Wrist flexion    Wrist extension    Wrist ulnar deviation    Wrist radial deviation    Wrist pronation    Wrist supination    (Blank rows = not tested)  HAND FUNCTION: Grip strength: Right: 29 lbs; Left: 36 lbs  COORDINATION: 9 Hole Peg test: Right: 47.15 sec; Left: 42.72 sec Box and Blocks:  Right 35 blocks, Left 40 blocks 3 button/unbutton: 58.79 sec  SENSATION: WFL  EDEMA: NA   COGNITION: Overall cognitive status: Within functional limits for tasks assessed  VISION: Subjective report:  no changes Baseline vision: Wears glasses all the time  VISION ASSESSMENT: Not tested                                                                                                                             TREATMENT DATE:  01/18/24 Coordination: w/RUE, including: picking up various small objects/coins and trying to see how many pt  is able to hold in-hand without drops, and picking up 5-10 coins 1 at a time and translating palm to fingertips to place into coin slot.  Pt demonstrating mild improvements with picking up larger coins, still with difficulty with smaller coins due to fingernails.   9 hole peg test: Right: 35.81 sec; Left: 34.62 sec Typing: OT educating pt on various online typing programs.  Pt completing 1 min of typing at 11WPM with 1 error = 10 WPM.  Completed 2 mins with pt demonstrating improved speed however with increased errors yielding 14 WPM with 10 typos = 9 WPM.  Transitioned to common words instead of passages with pt completing with improved speed and accuracy with 26 WPM and 96% accuracy and then 22 WPM and 94% accuracy.  Pt pleased with availability of typing practice at home.    01/16/24 Pipe tree: Engaged in pipe tree puzzle in sitting with focus on coordination and manipulation of pieces and increased shoulder flexion.  Pt demonstrating good functional use of RUE when picking up and manipulating pieces.  Pt demonstrating good motor control with increased flexion. Shoulder ROM: OT providing verbal cues and demonstration.  Pt utilizing personal cane as dowel.  Completed each 2 sets of 10. - Shoulder Flexion Overhead with Dowel   - Seated Shoulder Flexion AAROM with Dowel   - Seated Shoulder Abduction AAROM with Dowel   - Seated Shoulder External Rotation AAROM with Cane and Hand in Neutral  - Seated Scapular Retraction    01/10/24 Coordination: engaged in structured activities focusing on [facility] with RUE with picking up and manipulating items. picking up  assorted small items with R hand and placing into container.  Pt with increased difficulty with smaller or flatter items.  Engaged in picking up coins one at a time and placing them into coin slot and then progressing to translating 5 coins in palm of hand to finger tips to place into coin slot.   Stacking/unstacking coins.  Pt with difficulty picking up coins from table top, therefore sliding off table intermittently - especially with smaller coins.  Pt demonstrating improved ease with unstacking compared to stacking of coins. Stringing beads with R hand and then L hand due to bimanual nature of task. Pt either utilizing RUE to place bead onto string or placing string into bead.   Small peg board pattern replication with focus on picking up small beads and placing into peg board.  OT then challenging pt to remove pegs one at a time and translate into palm of hand to hold up to 10 pegs in palm.      PATIENT EDUCATION: Education details: coordination, typing Person educated: Patient Education method: Explanation, Demonstration, and Handouts Education comprehension: verbalized understanding and needs further education  HOME EXERCISE PROGRAM: Coordination HEP - See pt instructions  Access Code: [email or web address] URL: [email or web address] Date: 01/16/2024 Prepared by: [facility] - Outpatient  Rehab - [facility]  Exercises - Seated Shoulder Blade Squeeze  - 2 x daily - 2 sets - 10 reps - Shoulder Flexion Overhead with Dowel  - 2 x daily - 2 sets - 10 reps - Seated Shoulder Flexion AAROM with Dowel  - 2 x daily - 2 sets - 10 reps - Seated Shoulder Abduction AAROM with Dowel  - 2 x daily - 2 sets - 10 reps - Seated Shoulder External Rotation AAROM with Cane and Hand in Neutral  - 2 x daily - 2 sets - 10 reps   GOALS: Goals reviewed with  patient? Yes  SHORT TERM GOALS: Target date: 01/18/24  Pt will be independent in ROM and coordination HEP. Baseline: decreased ROM, strength,  and coordination of dominant UE Goal status: in progress  2.  Pt will verbalize understanding of task modifications and/or potential A/E needs to increase ease, safety, and independence w/ ADLs. Baseline: difficulty with tying shoes, buttoning buttons, putting on jewelry and makeup Goal status:  in progress  3.  Pt will demonstrate improved fine motor coordination for ADLs as evidenced by decreasing 9 hole peg test score for RUE by 3 secs Baseline: 9 Hole Peg test: Right: 47.15 sec; Left: 42.72 sec 01/18/24: Right: 35.81 sec; Left: 34.62 sec Goal status:  MET  4.  Pt will verbalizing understanding of typing programs and/or adaptive techniques to allow for increased ease with computer use (voice to text). Baseline:  01/18/24: instructed on typing websites with pt demonstrating good hand placement with decreasing errors with focus on one word at a time Goal status:  in progress   LONG TERM GOALS: Target date: 02/08/23  Pt will demonstrate improved ease with fastening buttons as evidenced by decreasing 3 button/ unbutton time to 35 seconds or less with use of AE PRN. Baseline: 58 sec Goal status:  in progress  2.  Pt will write a short paragraph with 100% legibility and no significant decrease in quality of writing. Baseline: decreased quality from start to finish Goal status:  in progress  3.  Pt will demonstrate improved fine motor coordination for ADLs as evidenced by decreasing 9 hole peg test score for RUE by 6 secs Baseline: 9 Hole Peg test: Right: 47.15 sec; Left: 42.72 sec Goal status:  in progress  4.  Pt will demonstrate ability to retrieve a lightweight object at 110* shoulder flexion for increased ROM as needed for ADLs and IADLs. Baseline: 88* Goal status:  in progress  5.  Patient will report at least two-point increase in average PSFS score or at least three-point increase in a single activity score indicating functionally significant improvement given minimum detectable  change. Baseline: 3.0 Goal status:  in progress  ASSESSMENT:  CLINICAL IMPRESSION: Patient is a 89 y.o. female who was seen today for occupational therapy treatment for ongoing RUE weakness s/p CVA in January 2025.  Pt demonstrating good effort with coordination tasks this session, does still demonstrate mild coordination impairments due to impaired sensation and longer finger nails.  Pt demonstrating improvements in ease and accuracy with typing with standard keyboard and focus on one word at a time.  Pt pleased with ability to practice typing in session and to continue at home.  Pt will continue to benefit from skilled occupational therapy services to address strength and coordination, ROM, pain management, balance, GM/FM control, safety awareness, introduction of compensatory strategies/AE prn, and implementation of an HEP to improve participation and safety during ADLs and IADLs.    PERFORMANCE DEFICITS: in functional skills including ADLs, IADLs, coordination, ROM, strength, pain, Fine motor control, Gross motor control, body mechanics, decreased knowledge of precautions, decreased knowledge of use of DME, and UE functional use and psychosocial skills including coping strategies, environmental adaptation, and routines and behaviors.      PLAN:  OT FREQUENCY: 2x/week  OT DURATION: 6 weeks  PLANNED INTERVENTIONS: 97168 OT Re-evaluation, 97535 self care/ADL training, 02889 therapeutic exercise, 97530 therapeutic activity, 97112 neuromuscular re-education, 97035 ultrasound, [id number] electrical stimulation (manual), passive range of motion, energy conservation, coping strategies training, patient/family education, and DME and/or AE instructions  RECOMMENDED  OTHER SERVICES: NA  CONSULTED AND AGREED WITH PLAN OF CARE: Patient  PLAN FOR NEXT SESSION: review and have pt complete coordination HEP, incorporate shoulder, initiate handwriting and typing practice    KAYLENE DOMINO,  OTR/L 01/18/2024, 11:19 AM   [facility] at [facility] [address] [place], [place] [address] Phone # [phone] Fax # [phone]         "

## 2024-01-22 ENCOUNTER — Ambulatory Visit: Admitting: Occupational Therapy

## 2024-01-22 ENCOUNTER — Ambulatory Visit

## 2024-01-22 DIAGNOSIS — M6281 Muscle weakness (generalized): Secondary | ICD-10-CM

## 2024-01-22 DIAGNOSIS — R2681 Unsteadiness on feet: Secondary | ICD-10-CM

## 2024-01-22 DIAGNOSIS — R2689 Other abnormalities of gait and mobility: Secondary | ICD-10-CM | POA: Diagnosis not present

## 2024-01-22 DIAGNOSIS — R278 Other lack of coordination: Secondary | ICD-10-CM

## 2024-01-22 DIAGNOSIS — I69351 Hemiplegia and hemiparesis following cerebral infarction affecting right dominant side: Secondary | ICD-10-CM

## 2024-01-22 DIAGNOSIS — R4701 Aphasia: Secondary | ICD-10-CM

## 2024-01-22 NOTE — Therapy (Signed)
 " OUTPATIENT OCCUPATIONAL THERAPY NEURO  Treatment Note  Patient Name: Lynn Ray MRN: [id number] DOB:Mar 07, 1934, 89 y.o., female Today's Date: 01/22/2024  PCP: Rollene Almarie LABOR, MD REFERRING PROVIDER: Rollene Almarie LABOR, MD  END OF SESSION:  OT End of Session - 01/22/24 1452     Visit Number 5    Number of Visits 13    Date for Recertification  02/08/24    Authorization Type Auth#: [id number] , approved 13 OT visits from 12/25/2023-03/24/2024.**  [employer] 2025    OT Start Time 1448    OT Stop Time 1528    OT Time Calculation (min) 40 min              Past Medical History:  Diagnosis Date   Arthritis    Cancer (HCC) 2004   rectal cancer   Cataract    right eye   Chiari malformation    CKD (chronic kidney disease), stage IV (HCC)    Depression    Diabetes mellitus (HCC)    pre   Hypertension    Sleep apnea    no CPAP   Thyroid  disease    hyporthyroid   Past Surgical History:  Procedure Laterality Date   CATARACT EXTRACTION Bilateral    chiari malformation  2013   surgery   COLONOSCOPY     FLEXIBLE SIGMOIDOSCOPY     POLYPECTOMY     Patient Active Problem List   Diagnosis Date Noted   Sinus pause 07/06/2023   Aphasia, late effect of cerebrovascular disease 04/27/2023   Hx of completed stroke 03/16/2023   Anemia 02/23/2023   Vision changes 04/18/2022   Pre-diabetes 09/29/2020   Stage 4 chronic kidney disease (HCC) 12/22/2019   History of Chiari malformation 10/09/2017   Chronic right shoulder pain 10/09/2017   Routine general medical examination at a health care facility 07/13/2017   History of malignant neoplasm of large intestine 11/18/2010   BARRETT'S ESOPHAGUS 01/14/2008   Hypothyroidism 12/10/2007   Morbid obesity (HCC) 12/10/2007   Essential hypertension 12/10/2007   AORTIC VALVE DISORDERS 12/10/2007   COPD 12/10/2007   GERD 12/09/2007    ONSET DATE: referral date 11/26/23 (CVA 01/2023)  REFERRING DIAG: R29.898  (ICD-10-CM) - Hand weakness  THERAPY DIAG:  Hemiplegia and hemiparesis following cerebral infarction affecting right dominant side (HCC)  Other lack of coordination  Muscle weakness (generalized)  Rationale for Evaluation and Treatment: Rehabilitation  SUBJECTIVE:   SUBJECTIVE STATEMENT: Pt reports that she has been working on her typing and it seems to be going better.  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 No - but does report that she has intermittent pain in her R thumb (arthritis)  FALLS: Has patient fallen in last 6 months? No  LIVING ENVIRONMENT: Lives with: lives alone (has healthcare aids that stay with her overnight) Lives in: House/apartment Stairs: 2 steps to enter ; split level home; bedroom on 2nd floor - 6 steps to access Has following equipment at home: Single point cane, shower chair, Grab bars, and handicap height toilet seats  PLOF: Independent, Independent with basic ADLs, and Leisure: member of a few local organizations, enjoy crafting  PATIENT GOALS: to be able to use dominant UE more independently and resume crafts  OBJECTIVE:  Note: Objective measures were completed at Evaluation unless otherwise noted.  HAND DOMINANCE: Right  ADLs: Overall ADLs: reports increased time to get dressed Transfers/ambulation related to ADLs: utilizing [facility] Eating: some difficulty with scooping and cutting foods Grooming: enjoys wearing makeup, requiring increased time UB Dressing: difficulty with buttons, zipping dress zippers (in back) LB Dressing: does have difficulty with tying shoes but primarily wearing slip on shoes Toileting: Mod I Bathing: Mod I Tub Shower transfers: increased time and effort when exiting bathtub/shower Equipment: Shower seat with back and Grab  bars  IADLs:  Light housekeeping: healthcare aids help with mopping, cleaning bathroom, taking out the trash Meal Prep: minimal cooking, will cook soups or bring in food Community mobility: still driving, however not driving at night time Medication management: utilizing pill box  Financial management: daughter is completing Handwriting: 100% legible and Increased time - requiring 28.19 sec to write [place].  MOBILITY STATUS: Needs Assist: Utilizing [facility] for mobility  POSTURE COMMENTS:  rounded shoulders and forward head  ACTIVITY TOLERANCE: Activity tolerance: diminished  FUNCTIONAL OUTCOME MEASURES: PSFS: 3.0   UPPER EXTREMITY ROM:    Active ROM Right eval Left eval  Shoulder flexion 88 WFL  Shoulder abduction    Shoulder adduction    Shoulder extension    Shoulder internal rotation Able to reach to back pocket, reports mild tingling at end range [facility]  Shoulder external rotation WFL (reports tingling at end range) [facility]  Elbow flexion    Elbow extension    Wrist flexion    Wrist extension    Wrist ulnar deviation    Wrist radial deviation    Wrist pronation    Wrist supination    (Blank rows = not tested)  UPPER EXTREMITY MMT:     MMT Right eval Left eval  Shoulder flexion 4+ 3-  Shoulder abduction    Shoulder adduction    Shoulder extension    Shoulder internal rotation    Shoulder external rotation    Middle trapezius    Lower trapezius    Elbow flexion    Elbow extension    Wrist flexion    Wrist extension    Wrist ulnar deviation    Wrist radial deviation    Wrist pronation    Wrist supination    (Blank rows = not tested)  HAND FUNCTION: Grip strength: Right: 29 lbs; Left: 36 lbs  COORDINATION: 9 Hole Peg test: Right: 47.15 sec; Left: 42.72 sec Box and Blocks:  Right 35 blocks, Left 40 blocks 3 button/unbutton: 58.79 sec  SENSATION: WFL  EDEMA: NA   COGNITION: Overall cognitive status: Within functional limits  for tasks assessed  VISION: Subjective report: no changes Baseline vision: Wears glasses all the time  VISION ASSESSMENT: Not tested                                                                                                                             TREATMENT DATE:  01/22/24 Coordination: engaged in peg board pattern  replication with RUE with focus on picking up and placing pegs one at a time into grid.  OT then increased challenge to completing with in-hand manipulation - picking up 9 pegs and then translating from palm to finger tips.  Pt with no drops completing one at a time, however dropping x2 when translating from palm to finger tips.  Pt then removing with in-hand manipulation with no drops when translating back into palm. Handwriting: engaged in writing 10-15 item grocery list.  Pt demonstrating good sizing and legibility, however does continue to decrease just a bit as she fatigues.  OT reiterating use of lined paper as able and taking breaks if she notices fatigue or decrease in quality of writing.   01/18/24 Coordination: w/RUE, including: picking up various small objects/coins and trying to see how many pt is able to hold in-hand without drops, and picking up 5-10 coins 1 at a time and translating palm to fingertips to place into coin slot.  Pt demonstrating mild improvements with picking up larger coins, still with difficulty with smaller coins due to fingernails.   9 hole peg test: Right: 35.81 sec; Left: 34.62 sec Typing: OT educating pt on various online typing programs.  Pt completing 1 min of typing at 11WPM with 1 error = 10 WPM.  Completed 2 mins with pt demonstrating improved speed however with increased errors yielding 14 WPM with 10 typos = 9 WPM.  Transitioned to common words instead of passages with pt completing with improved speed and accuracy with 26 WPM and 96% accuracy and then 22 WPM and 94% accuracy.  Pt pleased with availability of typing practice at  home.    01/16/24 Pipe tree: Engaged in pipe tree puzzle in sitting with focus on coordination and manipulation of pieces and increased shoulder flexion.  Pt demonstrating good functional use of RUE when picking up and manipulating pieces.  Pt demonstrating good motor control with increased flexion. Shoulder ROM: OT providing verbal cues and demonstration.  Pt utilizing personal cane as dowel.  Completed each 2 sets of 10. - Shoulder Flexion Overhead with Dowel   - Seated Shoulder Flexion AAROM with Dowel   - Seated Shoulder Abduction AAROM with Dowel   - Seated Shoulder External Rotation AAROM with Cane and Hand in Neutral  - Seated Scapular Retraction     PATIENT EDUCATION: Education details: coordination, typing Person educated: Patient Education method: Explanation, Demonstration, and Handouts Education comprehension: verbalized understanding and needs further education  HOME EXERCISE PROGRAM: Coordination HEP - See pt instructions  Access Code: [email or web address] URL: [email or web address] Date: 01/16/2024 Prepared by: [facility]  Rehab - [facility]  Exercises - Seated Shoulder Blade Squeeze  - 2 x daily - 2 sets - 10 reps - Shoulder Flexion Overhead with Dowel  - 2 x daily - 2 sets - 10 reps - Seated Shoulder Flexion AAROM with Dowel  - 2 x daily - 2 sets - 10 reps - Seated Shoulder Abduction AAROM with Dowel  - 2 x daily - 2 sets - 10 reps - Seated Shoulder External Rotation AAROM with Cane and Hand in Neutral  - 2 x daily - 2 sets - 10 reps   GOALS: Goals reviewed with patient? Yes  SHORT TERM GOALS: Target date: 01/18/24  Pt will be independent in ROM and coordination HEP. Baseline: decreased ROM, strength, and coordination of dominant UE Goal status: in progress  2.  Pt will verbalize understanding of task modifications and/or potential A/E needs to  increase ease, safety, and independence w/ ADLs. Baseline: difficulty with tying shoes,  buttoning buttons, putting on jewelry and makeup Goal status:  in progress  3.  Pt will demonstrate improved fine motor coordination for ADLs as evidenced by decreasing 9 hole peg test score for RUE by 3 secs Baseline: 9 Hole Peg test: Right: 47.15 sec; Left: 42.72 sec 01/18/24: Right: 35.81 sec; Left: 34.62 sec Goal status:  MET  4.  Pt will verbalizing understanding of typing programs and/or adaptive techniques to allow for increased ease with computer use (voice to text). Baseline:  01/18/24: instructed on typing websites with pt demonstrating good hand placement with decreasing errors with focus on one word at a time Goal status:  in progress   LONG TERM GOALS: Target date: 02/08/23  Pt will demonstrate improved ease with fastening buttons as evidenced by decreasing 3 button/ unbutton time to 35 seconds or less with use of AE PRN. Baseline: 58 sec Goal status:  in progress  2.  Pt will write a short paragraph with 100% legibility and no significant decrease in quality of writing. Baseline: decreased quality from start to finish Goal status:  in progress  3.  Pt will demonstrate improved fine motor coordination for ADLs as evidenced by decreasing 9 hole peg test score for RUE by 6 secs Baseline: 9 Hole Peg test: Right: 47.15 sec; Left: 42.72 sec Goal status:  in progress  4.  Pt will demonstrate ability to retrieve a lightweight object at 110* shoulder flexion for increased ROM as needed for ADLs and IADLs. Baseline: 88* Goal status:  in progress  5.  Patient will report at least two-point increase in average PSFS score or at least three-point increase in a single activity score indicating functionally significant improvement given minimum detectable change. Baseline: 3.0 Goal status:  in progress  ASSESSMENT:  CLINICAL IMPRESSION: Patient is a 89 y.o. female who was seen today for occupational therapy treatment for ongoing RUE weakness s/p CVA in January 2025.  Pt  demonstrating good effort with coordination tasks this session, demonstrating improved in-hand manipulation with translation.  Pt demonstrating improvements in legibility and sizing with handwriting. Pt voicing understanding of use of lined paper when possible and taking breaks when fatigued. Pt will continue to benefit from skilled occupational therapy services to address strength and coordination, ROM, pain management, balance, GM/FM control, safety awareness, introduction of compensatory strategies/AE prn, and implementation of an HEP to improve participation and safety during ADLs and IADLs.    PERFORMANCE DEFICITS: in functional skills including ADLs, IADLs, coordination, ROM, strength, pain, Fine motor control, Gross motor control, body mechanics, decreased knowledge of precautions, decreased knowledge of use of DME, and UE functional use and psychosocial skills including coping strategies, environmental adaptation, and routines and behaviors.      PLAN:  OT FREQUENCY: 2x/week  OT DURATION: 6 weeks  PLANNED INTERVENTIONS: 97168 OT Re-evaluation, 97535 self care/ADL training, 02889 therapeutic exercise, 97530 therapeutic activity, 97112 neuromuscular re-education, 97035 ultrasound, 97032 electrical stimulation (manual), passive range of motion, energy conservation, coping strategies training, patient/family education, and DME and/or AE instructions  RECOMMENDED OTHER SERVICES: NA  CONSULTED AND AGREED WITH PLAN OF CARE: Patient  PLAN FOR NEXT SESSION: review and have pt complete coordination HEP, incorporate shoulder, review handwriting and typing practice    KAYLENE DOMINO, OTR/L 01/22/2024, 2:52 PM   [facility] [address] [place], [place] [address] Phone # [phone] Fax # [phone]         "

## 2024-01-22 NOTE — Therapy (Signed)
 " OUTPATIENT PHYSICAL THERAPY NEURO TREATMENT NOTE   Patient Name: Lynn Ray MRN: [id number] DOB:[birth year], 89 y.o., female Today's Date: 01/22/2024   PCP: Rollene Almarie LABOR, MD  REFERRING PROVIDER: Rollene Almarie LABOR, MD   END OF SESSION:  PT End of Session - 01/22/24 1533     Visit Number 12    Number of Visits 19    Date for Recertification  02/05/24    Authorization Type [employer]    Authorization Time Period 8 visits 10/24-12/26;12 additional PT visits from 01/03/2024-02/05/2024    Authorization - Visit Number 4    Authorization - Number of Visits 12    Progress Note Due on Visit 17    PT Start Time 1530    PT Stop Time 1615    PT Time Calculation (min) 45 min    Equipment Utilized During Treatment Gait belt    Activity Tolerance Patient tolerated treatment well    Behavior During Therapy [facility] for tasks assessed/performed                 Past Medical History:  Diagnosis Date   Arthritis    Cancer (HCC) 2004   rectal cancer   Cataract    right eye   Chiari malformation    CKD (chronic kidney disease), stage IV (HCC)    Depression    Diabetes mellitus (HCC)    pre   Hypertension    Sleep apnea    no CPAP   Thyroid  disease    hyporthyroid   Past Surgical History:  Procedure Laterality Date   CATARACT EXTRACTION Bilateral    chiari malformation  2013   surgery   COLONOSCOPY     FLEXIBLE SIGMOIDOSCOPY     POLYPECTOMY     Patient Active Problem List   Diagnosis Date Noted   Sinus pause 07/06/2023   Aphasia, late effect of cerebrovascular disease 04/27/2023   Hx of completed stroke 03/16/2023   Anemia 02/23/2023   Vision changes 04/18/2022   Pre-diabetes 09/29/2020   Stage 4 chronic kidney disease (HCC) 12/22/2019   History of Chiari malformation 10/09/2017   Chronic right shoulder pain 10/09/2017   Routine general medical examination at a health care facility 07/13/2017   History of malignant neoplasm of large intestine  11/18/2010   BARRETT'S ESOPHAGUS 01/14/2008   Hypothyroidism 12/10/2007   Morbid obesity (HCC) 12/10/2007   Essential hypertension 12/10/2007   AORTIC VALVE DISORDERS 12/10/2007   COPD 12/10/2007   GERD 12/09/2007    ONSET DATE: 02/22/23  REFERRING DIAG: S13.26 (ICD-10-CM) - Hx of completed stroke Rollene Almarie LABOR, MD  THERAPY DIAG:  Hemiplegia and hemiparesis following cerebral infarction affecting right dominant side (HCC)  Muscle weakness (generalized)  Other abnormalities of gait and mobility  Unsteadiness on feet  Rationale for Evaluation and Treatment: Rehabilitation  SUBJECTIVE:  SUBJECTIVE STATEMENT: Doing ok, today is a good day  Pt accompanied by: family member  PERTINENT HISTORY: Rectal CA, chiari malformation s/p surgery, CKD IV, depression, DM, HTN,   PAIN:  Are you having pain? Yes: NPRS scale: 3-4/10 Pain location: L side of neck radiating to L elbow Pain description: sharp, N/T Aggravating factors: driving, reaching overhead Relieving factors: nothing   PRECAUTIONS: Fall  RED FLAGS: None   WEIGHT BEARING RESTRICTIONS: No  FALLS: Has patient fallen in last 6 months? Yes. Number of falls 1  LIVING ENVIRONMENT: Lives with: lives alone and has a PCA and daughter that stays with her on nights  Lives in: House/apartment Stairs: 2 steps to enter ; split level home; bedroom on 2nd floor Has following equipment at home: Single point cane, Shower bench, and Grab bars  PLOF: Independent with basic ADLs and Leisure: crafts; PCA helps with more vigorous chores   PATIENT GOALS: get back to normal as much as possible   OBJECTIVE:   TODAY'S TREATMENT: 01/22/24 Activity Comments  Dynamic balance -forward march/retrowalk x 1 min counter support -sidestep x 1 min BUE  support -4-square step -/+ obstacles: CGA for support  Static balance -feet together EC x 30 sec then head turns EC x 30 sec Semi-tandem x 15 sec then head movements on firm and foam  Pt education regarding [employer] programs in the area and relevant classes that would be beneficial , e.g. Sit and be fit, chair yoga, strength/balance. Education on benefits of exercise as it pertains to reduce cardiovascular risk   NU-step level 5 x 8 min To improve endurance             TODAY'S TREATMENT: 01/10/24 Activity Comments  Nustep L4 x 7 min LEs only Dynamic warm up   mini squats to mat 2x10  Pushing backs of legs into mat for stability   gait training with SPC, matching PT's changes in speed, quick stops/starts, multidirectional walking  Difficulty keeping up with PT's pace; apprehension walking backwards   sidestepping over hurdle Weaned to 1 UE support  Fwd stepping over over hurdle Wean to using cane only; good stability     PATIENT EDUCATION: Education details: discussed pt's experience with aquatic PT and reiterated recommendation to have family member/caregiver escort her to the pool [facility]- pt reports that she does not have family in the area that would be available. She states that she can check with her aids. Advised patient to call PCP if she starts noticing worsening N/T or UE weakness d/t concern for cervical radiculopathy  Person educated: Patient Education method: Explanation Education comprehension: verbalized understanding    HOME EXERCISE PROGRAM Access Code: [email or web address] URL: [email or web address] Date: 01/01/2024 Prepared by: [facility] - Outpatient  Rehab - [facility]  Program Notes perform standing exercises at counter for safety  Exercises - Heel Toe Raises with Counter Support  - 1 x daily - 5 x weekly - 2 sets - 10 reps - Standing Toe Taps  - 1 x daily - 5 x weekly - 2 sets - 10 reps - Alternating Step Forward with Support  - 1 x daily -  5 x weekly - 2 sets - 10 reps - Standing March with Counter Support  - 1 x daily - 5 x weekly - 2 sets - 20 reps - Seated Hip Adduction Isometrics with Ball  - 1 x daily - 5 x weekly - 2 sets - 10 reps - 5 sec hold - Seated Long Arc  Quad  - 1 x daily - 5 x weekly - 2 sets - 10 reps - Seated March with Resistance  - 1 x daily - 5 x weekly - 3 sets - 10 reps - Seated Ankle Dorsiflexion with Resistance  - 1 x daily - 5 x weekly - 3 sets - 10 reps   PATIENT EDUCATION: Education details: Update to HEP for seated resisted exercise; pt reports she verbalizes understanding on aquatic instructions  Person educated: Patient Education method: Explanation, Demonstration, Tactile cues, Verbal cues, and Handouts Education comprehension: verbalized understanding and returned demonstration           ------------------------------------- Note: Objective measures were completed at Evaluation unless otherwise noted.  DIAGNOSTIC FINDINGS: none recent  COGNITION: Overall cognitive status: Within functional limits for tasks assessed   SENSATION: Pt reports R>L hand numbness   COORDINATION: Alternating pronation/supination: slowed B Alternating toe tap: WNL B Finger to nose: slight dysmetria R>L   MUSCLE TONE: WNL B LEs  POSTURE: rounded shoulders and forward head  LOWER EXTREMITY ROM:     Active  Right Eval Left Eval  Hip flexion    Hip extension    Hip abduction    Hip adduction    Hip internal rotation    Hip external rotation    Knee flexion    Knee extension    Ankle dorsiflexion 15 10  Ankle plantarflexion    Ankle inversion    Ankle eversion     (Blank rows = not tested)  LOWER EXTREMITY MMT:    MMT  Right Eval Left Eval  Hip flexion 4 4+  Hip extension    Hip abduction 4+ 4+  Hip adduction 4 4  Hip internal rotation    Hip external rotation    Knee flexion 4 4  Knee extension 4 4  Ankle dorsiflexion 3+ 4  Ankle plantarflexion 4 4  Ankle inversion     Ankle eversion    (Blank rows = not tested)  GAIT: Findings: Assistive device utilized:Single point cane, Level of assistance: SBA, and Comments: slowed, foot flat B and shuffling gait   FUNCTIONAL TESTS:  5 times sit to stand: 22.04 sec without standing fully and pushing off knees 10 meter walk test: 22.59 sec (1.45 ft/sec)                                                                                            HOME EXERCISE PROGRAM: Not yet initiated  GOALS: Goals reviewed with patient? Yes  SHORT TERM GOALS: Target date: 12/21/2023  Patient to be independent with initial HEP. Baseline: HEP initiated Goal status: MET    LONG TERM GOALS: Target date: 02/05/24  Patient to be independent with advanced HEP. Baseline: Not yet initiated  Goal status: IN PROGRESS  Patient to demonstrate B LE strength >/=4+/5.  Baseline: See above; improved in R LE, not quite met 12/25/23  Goal status: IN PROGRESS 12/25/23   Patient to demonstrate 30% improvement in foot clearance with gait.  Baseline: foot flat B; improved initially by 50% but quickly fatigues 12/25/23  Goal status: IN PROGRESS 12/25/23  Patient to  demonstrate gait speed of at least 2 ft/sec in order to improve falls risk.  Baseline: 1.45 ft/sec; 1.82 ft/sec 12/25/23 Goal status: IN PROGRESS 12/25/23  Patient to demonstrate 5xSTS test in <15 sec in order to decrease risk of falls.  Baseline: 22.05; 19.34 sec pushing off knees 12/25/23  Goal status: IN PROGRESS 12/25/23    ASSESSMENT:  CLINICAL IMPRESSION: Reports RUE symptoms improved. Session focus on dynamic standing balance to improve negotiatio nof obstacles and maintaining single limb support/loading response to improve stability with SBA-CGA for dynamic balance activities.  Good performance to static multisensory demands with minimal sway throughout. Educated on benefits of general exercise and various classes that would be accessible and complement her PT POC.   NU-step to end session to improve endurance with good tolerance throughout session only requiring 1 seated rest period.   OBJECTIVE IMPAIRMENTS: Abnormal gait, decreased activity tolerance, decreased balance, decreased coordination, decreased endurance, difficulty walking, decreased strength, postural dysfunction, and pain.   ACTIVITY LIMITATIONS: carrying, lifting, bending, sitting, standing, squatting, sleeping, stairs, transfers, bed mobility, bathing, toileting, dressing, reach over head, hygiene/grooming, and locomotion level  PARTICIPATION LIMITATIONS: meal prep, shopping, community activity, and church  PERSONAL FACTORS: Age, Past/current experiences, Time since onset of injury/illness/exacerbation, and 3+ comorbidities: Rectal CA, chiari malformation s/p surgery, CKD IV, depression, DM, HTN,  are also affecting patient's functional outcome.   REHAB POTENTIAL: Good  CLINICAL DECISION MAKING: Evolving/moderate complexity  EVALUATION COMPLEXITY: Moderate  PLAN:  PT FREQUENCY: 2x/week  PT DURATION: 6 weeks  PLANNED INTERVENTIONS: 97110-Therapeutic exercises, 97530- Therapeutic activity, [id number]- Neuromuscular re-education, 97535- Self Care, [id number]- Manual therapy, [id number]- Gait training, [phone]- Orthotic Initial, [phone]- Canalith repositioning, [phone]- Aquatic Therapy, [phone]- Electrical stimulation (manual), Patient/Family education, Balance training, Stair training, Taping, Vestibular training, Cryotherapy, and Moist heat  PLAN FOR NEXT SESSION:  Review and progress HEP for RLE strength, balance, work on speed of movement, transfers, foot clearance/heelstrike with gait; PLEASE RECOMMEND CAREGIVER COME WITH PATIENT TO POOL (I talked to her about this but she doesn't have anyone to come with her.  She seemed to get back here a little better from a cognitive standpoint but she was still fatigued.  I just told her to take her time and sit where needed on the way out - difficulty navigating DWB alone  in addition to fatigue when exiting pool; core engagement/unsupported sitting    AQUATICS: Frequency: 1 Duration: 4 weeks Special Instruction: endurance, LE strength; pt has mild R hemplegia      4:09 PM, 01/22/2024 M. Kelly Sible Straley, PT, DPT Physical Therapist- [place] Office Number: [phone]     "

## 2024-01-22 NOTE — Therapy (Signed)
 " OUTPATIENT SPEECH LANGUAGE PATHOLOGY APHASIA TREATMENT   Patient Name: Lynn Ray MRN: [id number] DOB:09/13/1934, 89 y.o., female Today's Date: 01/22/2024  PCP: Rollene Norris, MD REFERRING PROVIDER: same as PCP  END OF SESSION:  End of Session - 01/22/24 1621     Visit Number 4    Number of Visits 9    Date for Recertification  02/22/24    Authorization Type [employer]    SLP Start Time 1620    SLP Stop Time  1700    SLP Time Calculation (min) 40 min    Activity Tolerance Patient tolerated treatment well           Past Medical History:  Diagnosis Date   Arthritis    Cancer (HCC) 2004   rectal cancer   Cataract    right eye   Chiari malformation    CKD (chronic kidney disease), stage IV (HCC)    Depression    Diabetes mellitus (HCC)    pre   Hypertension    Sleep apnea    no CPAP   Thyroid  disease    hyporthyroid   Past Surgical History:  Procedure Laterality Date   CATARACT EXTRACTION Bilateral    chiari malformation  2013   surgery   COLONOSCOPY     FLEXIBLE SIGMOIDOSCOPY     POLYPECTOMY     Patient Active Problem List   Diagnosis Date Noted   Sinus pause 07/06/2023   Aphasia, late effect of cerebrovascular disease 04/27/2023   Hx of completed stroke 03/16/2023   Anemia 02/23/2023   Vision changes 04/18/2022   Pre-diabetes 09/29/2020   Stage 4 chronic kidney disease (HCC) 12/22/2019   History of Chiari malformation 10/09/2017   Chronic right shoulder pain 10/09/2017   Routine general medical examination at a health care facility 07/13/2017   History of malignant neoplasm of large intestine 11/18/2010   BARRETT'S ESOPHAGUS 01/14/2008   Hypothyroidism 12/10/2007   Morbid obesity (HCC) 12/10/2007   Essential hypertension 12/10/2007   AORTIC VALVE DISORDERS 12/10/2007   COPD 12/10/2007   GERD 12/09/2007    ONSET DATE: 02/22/23   REFERRING DIAG: Aphasia  THERAPY DIAG:  Aphasia  Rationale for Evaluation and Treatment:  Rehabilitation  SUBJECTIVE:   SUBJECTIVE STATEMENT: My talking is not like it was prior to the stroke.  Pt accompanied by: self  PERTINENT HISTORY: Rectal CA, chiari malformation s/p surgery, CKD IV, depression, DM, HTN,  PAIN:  Are you having pain? Yes: NPRS scale: 5/10 Pain location: bil knees Pain description: soreness Aggravating factors: climbing stairs Relieving factors: resting  FALLS: Has patient fallen in last 6 months?  Number of falls: 1; See PT eval.  LIVING ENVIRONMENT: Lives with: lives alone has personal care attendant, and her daughter sometimes stays with her nights Lives in: House/apartment  PLOF:  Level of assistance: Independent with ADLs, Comment: assistance with cleaning and some other more rigorous activities Employment: Retired  PATIENT GOALS: Improve ability to speak like she used to prior to CVA  OBJECTIVE:  Note: Objective measures were completed at Evaluation unless otherwise noted.  DIAGNOSTIC FINDINGS:  MRI- 02/23/23 IMPRESSION: 1. Patchy acute ischemic infarct involving the parasagittal left frontal lobe. Minimal associated petechial blood products without hemorrhagic transformation or significant mass effect. 2. Additional subcentimeter acute to early subacute ischemic nonhemorrhagic infarcts involving the subcortical right frontal lobe and superior left thalamus. 3. Underlying age-related cerebral atrophy with chronic microvascular ischemic disease, with a few scattered remote lacunar infarcts about the hemispheric cerebral white  matter and cerebellum.  ST Discharge from CIR 03/07/23 Clinical Impression/Discharge Summary:  Pt has made good gains and has met 2 of 3 LTG's this admission due to improved cognition. Pt is currently an overall supervisionA for cognitive tasks and requires min cues for utilization of word finding compensatory strategies during conversation. Pt/family education complete and pt will discharge home with  supervision from friends/family/etc. Pt would benefit from f/u ST services to maximize communication in order to maximize functional independence.      PATIENT REPORTED OUTCOME MEASURES (PROM): Communication Participation Item Bank: (CPIB); Pt scored herself 18/30 with lower scores indicating greater effect of pt's deficits on her communication participation.                                                                                                                             TREATMENT DATE:   01/22/24: Pt has not written down any words for prayer meeting yet. She has meeting tonight and told SLP that she feels she isn't a part of the group anymore. SLP and pt generated words related to possible prayer terms (names of God and Jesus and adjectives describing them), and topics her prayer group prays for. She req'd min-mod A, occasionally to generate these. SLP told pt to review these out loud a couple times prior to prayer group tonight. Pt thanked SLP for the session today.   01/18/24: Pt used SLP's suggestion from previous session and wrote out some words for prayer group. SLP also encouraged pt to bring her prayer journal to prayer group. Today SLP reiterated pt complete SFA modified for prayer terms; pt told SLP she would complete this. SLP assisted pt with homework she could not completed due to complexity, and she req'd min-mod cues usually with these entries. SLP also provided extra homework for anomia and sentence composition.  Pt was concerned about her family not understanding her aphasia so SLP offered a printout from [employer].   01/10/24: Pt notes that she experiences word-finding difficulties during prayer group meetings, when she gives a prayer. Pt experiences anomic pauses during prayers, which is different from prior to stroke. SLP introduced Primary School Teacher (SFA) as a strategy for optimizing word-retrieval. SLP duided pt through an example of SFA using  a picture of an object. After guided practice, pt provided 6+ descriptions in 7 Trials of SFA given frequent mod to min A. SLP educated pt in using personally-relevant words for SFA training (prayer-related terminology). Pt is agreeable and open to finding relevant words for helping with verbal prayer generation. SLP instructed pt in planning prayers before prayer group meetings to maximize confidence and minimize anomic pauses during prayer generation. SLP provided pt with SFA homework with up to 15 different objects to describe. SLP also encouraged pt to find personally relevant words related to prayer and her religion to utilize in future practice of word-finding strategies. Plan is to continue word-finding strategies for maximizing pt participation in social  ADLs and to increase QOL for communication.  12/25/23: N/A. Pt needs homework tracking sheet in first ST session.  PATIENT EDUCATION: Education details: eval results, possible goals, not-as-robust prognosis for improvement based upon pt age, and time post onset but SLP believes pt will make improvement to some degree Person educated: Patient Education method: Explanation Education comprehension: verbalized understanding   GOALS: Goals reviewed with patient? Yes  SHORT TERM GOALS: Target date:  01/28/24 (due to visit #)  Pt will improve CPIB to at least 21 at visit #5 Baseline: Goal status: ONGOING  2.  Pt will perform naming tasks using compensations PRN with 90% success in 2 sessions Baseline:  Goal status: ONGOING  3.  Pt will demo evidence of completing homework for anomia/aphasia between three sessions Baseline: 01/18/24 Goal status: ONGOING  4.  Pt will participate in mod complex conversation for 5 minutes with <4 anomic pauses Baseline:  Goal status: ONGOING   LONG TERM GOALS: Target date: 02/22/24  At visit #9, pt will improve CPIB score to higher than the score noted for STG #1 Baseline:  Goal status: ONGOING  2.   Pt will complete homework for anomia/aphasia at least 3 days/week after 01/25/24, as tallied by tracking sheet Baseline:  Goal status: ONGOING  3.  Pt will participate in mod complex-complex conversation for total 10 minutes with no more than 5 anomic pauses, using compensations PRN Baseline:  Goal status: ONGOING   ASSESSMENT:  CLINICAL IMPRESSION: Patient is a 89 y.o. F who was seen today for treatment of speech and language following a CVA in January 2025. See treatment date above for today's date for further details on today's session. On eval date she scored 23/30 on the odds administration of the [employer], which is below WNL (mean=27.13, standard deviation=2.06). She tells SLP that she has a lot of difficulty in conversations: with fast-moving topics, with strangers, when she needs to persuade a family member or friend to see a different point of view, or conversations needing to communicate something quickly. She is highly motivated for positive change.   OBJECTIVE IMPAIRMENTS: include aphasia. These impairments are limiting patient from effectively communicating at home and in community. Factors affecting potential to achieve goals and functional outcome are family/community support (possibly - pt arrived alone today). Patient will benefit from skilled SLP services to address above impairments and improve overall function.  REHAB POTENTIAL: Good  PLAN:  SLP FREQUENCY: 1x/week  SLP DURATION: 8 weeks  PLANNED INTERVENTIONS: Language facilitation, Environmental controls, Cueing hierachy, Internal/external aids, Functional tasks, Multimodal communication approach, SLP instruction and feedback, Compensatory strategies, Patient/family education, and [id number] Treatment of speech (30 or 45 min)     [employer], CF-SLP 01/22/2024, 4:24 PM  Referring diagnosis:  Aphasia R47.01 Treatment diagnosis (if different than referring diagnosis): same Date Symptoms Began: Jan  2025 # of Visits requested: 12  Time period for Authorization: 12/25/23 to 02/22/24  What was this (referring dx) caused by? []  Surgery []  Fall []  Ongoing issue []  Arthritis [x]  Other: ___CVA_________  Laterality: []  Rt   NA []  Lt []  Both  Functional Tool & Score: [place] Naming Test 23/20  Check all possible CPT codes:     See Planned Interventions listed in the Plan section of the Evaluation.     If [employer]: Choose 10 or less codes  If [employer]: Modalities are not covered  If [employer]: Check allowed ICD code combinations   If [facility] Plan or [employer]:  Cognitive training not covered     "

## 2024-01-28 ENCOUNTER — Ambulatory Visit: Admitting: Occupational Therapy

## 2024-01-28 ENCOUNTER — Ambulatory Visit

## 2024-01-28 DIAGNOSIS — M6281 Muscle weakness (generalized): Secondary | ICD-10-CM

## 2024-01-28 DIAGNOSIS — R2689 Other abnormalities of gait and mobility: Secondary | ICD-10-CM | POA: Diagnosis not present

## 2024-01-28 DIAGNOSIS — R278 Other lack of coordination: Secondary | ICD-10-CM

## 2024-01-28 DIAGNOSIS — I69351 Hemiplegia and hemiparesis following cerebral infarction affecting right dominant side: Secondary | ICD-10-CM

## 2024-01-28 DIAGNOSIS — R4701 Aphasia: Secondary | ICD-10-CM

## 2024-01-28 NOTE — Therapy (Signed)
 " OUTPATIENT SPEECH LANGUAGE PATHOLOGY APHASIA TREATMENT   Patient Name: Lynn Ray MRN: [id number] DOB:1934/12/26, 89 y.o., female Today's Date: 01/28/2024  PCP: Rollene Norris, MD REFERRING PROVIDER: same as PCP  END OF SESSION:  End of Session - 01/28/24 1501     Visit Number 5    Number of Visits 9    Date for Recertification  02/22/24    Authorization Type [employer]    SLP Start Time 1451    SLP Stop Time  1531    SLP Time Calculation (min) 40 min    Activity Tolerance Patient tolerated treatment well           Past Medical History:  Diagnosis Date   Arthritis    Cancer (HCC) 2004   rectal cancer   Cataract    right eye   Chiari malformation    CKD (chronic kidney disease), stage IV (HCC)    Depression    Diabetes mellitus (HCC)    pre   Hypertension    Sleep apnea    no CPAP   Thyroid  disease    hyporthyroid   Past Surgical History:  Procedure Laterality Date   CATARACT EXTRACTION Bilateral    chiari malformation  2013   surgery   COLONOSCOPY     FLEXIBLE SIGMOIDOSCOPY     POLYPECTOMY     Patient Active Problem List   Diagnosis Date Noted   Sinus pause 07/06/2023   Aphasia, late effect of cerebrovascular disease 04/27/2023   Hx of completed stroke 03/16/2023   Anemia 02/23/2023   Vision changes 04/18/2022   Pre-diabetes 09/29/2020   Stage 4 chronic kidney disease (HCC) 12/22/2019   History of Chiari malformation 10/09/2017   Chronic right shoulder pain 10/09/2017   Routine general medical examination at a health care facility 07/13/2017   History of malignant neoplasm of large intestine 11/18/2010   BARRETT'S ESOPHAGUS 01/14/2008   Hypothyroidism 12/10/2007   Morbid obesity (HCC) 12/10/2007   Essential hypertension 12/10/2007   AORTIC VALVE DISORDERS 12/10/2007   COPD 12/10/2007   GERD 12/09/2007    ONSET DATE: 02/22/23   REFERRING DIAG: Aphasia  THERAPY DIAG:  No diagnosis found.  Rationale for Evaluation and  Treatment: Rehabilitation  SUBJECTIVE:   SUBJECTIVE STATEMENT: My talking is not like it was prior to the stroke.  Pt accompanied by: self  PERTINENT HISTORY: Rectal CA, chiari malformation s/p surgery, CKD IV, depression, DM, HTN,  PAIN:  Are you having pain? Yes: NPRS scale: 3/10 Pain location: bil knees Pain description: soreness Aggravating factors: climbing stairs Relieving factors: resting  FALLS: Has patient fallen in last 6 months?  Number of falls: 1; See PT eval.   PATIENT GOALS: Improve ability to speak like she used to prior to CVA  OBJECTIVE:  Note: Objective measures were completed at Evaluation unless otherwise noted.  DIAGNOSTIC FINDINGS:  MRI- 02/23/23 IMPRESSION: 1. Patchy acute ischemic infarct involving the parasagittal left frontal lobe. Minimal associated petechial blood products without hemorrhagic transformation or significant mass effect. 2. Additional subcentimeter acute to early subacute ischemic nonhemorrhagic infarcts involving the subcortical right frontal lobe and superior left thalamus. 3. Underlying age-related cerebral atrophy with chronic microvascular ischemic disease, with a few scattered remote lacunar infarcts about the hemispheric cerebral white matter and cerebellum.  ST Discharge from CIR 03/07/23 Clinical Impression/Discharge Summary:  Pt has made good gains and has met 2 of 3 LTG's this admission due to improved cognition. Pt is currently an overall supervisionA for cognitive tasks  and requires min cues for utilization of word finding compensatory strategies during conversation. Pt/family education complete and pt will discharge home with supervision from friends/family/etc. Pt would benefit from f/u ST services to maximize communication in order to maximize functional independence.      PATIENT REPORTED OUTCOME MEASURES (PROM): Communication Participation Item Bank: (CPIB); Pt scored herself 18/30 with lower scores indicating  greater effect of pt's deficits on her communication participation.                                                                                                                             TREATMENT DATE:   01/28/24: Pt needs CPIB next session. She completed naming (synonym) task today with 85% success. Pt wrote down words for prayer meeting but did not pray due to other women praying very quickly. SLP assisted pt in problem solving for how she could cont to pray without feeling pressure of time/rate; SLP and pt agreed that  pt could ask to begin or end prayer time so as not to feel pressure of the rate of speech/ speed of her prayer. SLP asked about CVA/aphasia info SLP provided and pt stated she was going to provide this to her family for education about aphasia and CVA. Today's 7-minute mod complex conversation was completed with WNL fluidity.  01/22/24: Pt has not written down any words for prayer meeting yet. She has meeting tonight and told SLP that she feels she isn't a part of the group anymore. SLP and pt generated words related to possible prayer terms (names of God and Jesus and adjectives describing them), and topics her prayer group prays for. She req'd min-mod A, occasionally to generate these. SLP told pt to review these out loud a couple times prior to prayer group tonight. Pt thanked SLP for the session today.   01/18/24: Pt used SLP's suggestion from previous session and wrote out some words for prayer group. SLP also encouraged pt to bring her prayer journal to prayer group. Today SLP reiterated pt complete SFA modified for prayer terms; pt told SLP she would complete this. SLP assisted pt with homework she could not completed due to complexity, and she req'd min-mod cues usually with these entries. SLP also provided extra homework for anomia and sentence composition.  Pt was concerned about her family not understanding her aphasia so SLP offered a printout from [employer].   01/10/24: Pt notes that she experiences word-finding difficulties during prayer group meetings, when she gives a prayer. Pt experiences anomic pauses during prayers, which is different from prior to stroke. SLP introduced Primary School Teacher (SFA) as a strategy for optimizing word-retrieval. SLP duided pt through an example of SFA using a picture of an object. After guided practice, pt provided 6+ descriptions in 7 Trials of SFA given frequent mod to min A. SLP educated pt in using personally-relevant words for SFA training (prayer-related terminology). Pt is agreeable and open to finding  relevant words for helping with verbal prayer generation. SLP instructed pt in planning prayers before prayer group meetings to maximize confidence and minimize anomic pauses during prayer generation. SLP provided pt with SFA homework with up to 15 different objects to describe. SLP also encouraged pt to find personally relevant words related to prayer and her religion to utilize in future practice of word-finding strategies. Plan is to continue word-finding strategies for maximizing pt participation in social ADLs and to increase QOL for communication.  12/25/23: N/A. Pt needs homework tracking sheet in first ST session.  PATIENT EDUCATION: Education details: eval results, possible goals, not-as-robust prognosis for improvement based upon pt age, and time post onset but SLP believes pt will make improvement to some degree Person educated: Patient Education method: Explanation Education comprehension: verbalized understanding   GOALS: Goals reviewed with patient? Yes  SHORT TERM GOALS: Target date:  01/28/24 (due to visit #)  Pt will improve CPIB to at least 21 at visit #5 Baseline: Goal status: ONGOING  2.  Pt will perform naming tasks using compensations PRN with 90% success in 2 sessions Baseline:  Goal status: partially met  3.  Pt will demo evidence of completing homework for  anomia/aphasia between three sessions Baseline: 01/18/24, 01/28/24 Goal status: partially met  4.  Pt will participate in mod complex conversation for 5 minutes with <4 anomic pauses Baseline:  Goal status: Met   LONG TERM GOALS: Target date: 02/22/24  At visit #9, pt will improve CPIB score to higher than the score noted for STG #1 Baseline:  Goal status: ONGOING  2.  Pt will complete homework for anomia/aphasia at least 3 days/week after 01/25/24, as tallied by tracking sheet Baseline:  Goal status: ONGOING  3.  Pt will participate in mod complex-complex conversation for total 10 minutes with no more than 5 anomic pauses, using compensations PRN Baseline:  Goal status: ONGOING   ASSESSMENT:  CLINICAL IMPRESSION: STGs (except #1) checked today. Pt will take CPIB next session. Patient is a 89 y.o. F who was seen today for treatment of speech and language following a CVA in January 2025. See treatment date above for today's date for further details on today's session. On eval date she scored 23/30 on the odds administration of the [employer], which is below WNL (mean=27.13, standard deviation=2.06). She tells SLP that she has a lot of difficulty in conversations: with fast-moving topics, with strangers, when she needs to persuade a family member or friend to see a different point of view, or conversations needing to communicate something quickly. She is highly motivated for positive change.   OBJECTIVE IMPAIRMENTS: include aphasia. These impairments are limiting patient from effectively communicating at home and in community. Factors affecting potential to achieve goals and functional outcome are family/community support (possibly - pt arrived alone today). Patient will benefit from skilled SLP services to address above impairments and improve overall function.  REHAB POTENTIAL: Good  PLAN:  SLP FREQUENCY: 1x/week  SLP DURATION: 8 weeks  PLANNED INTERVENTIONS: Language  facilitation, Environmental controls, Cueing hierachy, Internal/external aids, Functional tasks, Multimodal communication approach, SLP instruction and feedback, Compensatory strategies, Patient/family education, and [id number] Treatment of speech (30 or 45 min)     [employer], CF-SLP 01/28/2024, 3:02 PM  Referring diagnosis:  Aphasia R47.01 Treatment diagnosis (if different than referring diagnosis): same Date Symptoms Began: Jan 2025 # of Visits requested: 12  Time period for Authorization: 12/25/23 to 02/22/24  What was this (referring dx) caused by? []   Surgery []  Fall []  Ongoing issue []  Arthritis [x]  Other: ___CVA_________  Laterality: []  Rt   NA []  Lt []  Both  Functional Tool & Score: [place] Naming Test 23/20  Check all possible CPT codes:     See Planned Interventions listed in the Plan section of the Evaluation.     If [employer]: Choose 10 or less codes  If [employer]: Modalities are not covered  If [employer]: Check allowed ICD code combinations   If [facility] Plan or [employer]: Cognitive training not covered     "

## 2024-01-28 NOTE — Therapy (Signed)
 " OUTPATIENT OCCUPATIONAL THERAPY NEURO  Treatment Note  Patient Name: Lynn Ray MRN: [id number] DOB:February 12, 1934, 89 y.o., female Today's Date: 01/28/2024  PCP: Rollene Almarie LABOR, MD REFERRING PROVIDER: Rollene Almarie LABOR, MD  END OF SESSION:  OT End of Session - 01/28/24 1406     Visit Number 6    Number of Visits 13    Date for Recertification  02/08/24    Authorization Type Auth#: [id number] , approved 13 OT visits from 12/25/2023-03/24/2024.**  [employer] 2025    OT Start Time 1403    OT Stop Time 1445    OT Time Calculation (min) 42 min              Past Medical History:  Diagnosis Date   Arthritis    Cancer (HCC) 2004   rectal cancer   Cataract    right eye   Chiari malformation    CKD (chronic kidney disease), stage IV (HCC)    Depression    Diabetes mellitus (HCC)    pre   Hypertension    Sleep apnea    no CPAP   Thyroid  disease    hyporthyroid   Past Surgical History:  Procedure Laterality Date   CATARACT EXTRACTION Bilateral    chiari malformation  2013   surgery   COLONOSCOPY     FLEXIBLE SIGMOIDOSCOPY     POLYPECTOMY     Patient Active Problem List   Diagnosis Date Noted   Sinus pause 07/06/2023   Aphasia, late effect of cerebrovascular disease 04/27/2023   Hx of completed stroke 03/16/2023   Anemia 02/23/2023   Vision changes 04/18/2022   Pre-diabetes 09/29/2020   Stage 4 chronic kidney disease (HCC) 12/22/2019   History of Chiari malformation 10/09/2017   Chronic right shoulder pain 10/09/2017   Routine general medical examination at a health care facility 07/13/2017   History of malignant neoplasm of large intestine 11/18/2010   BARRETT'S ESOPHAGUS 01/14/2008   Hypothyroidism 12/10/2007   Morbid obesity (HCC) 12/10/2007   Essential hypertension 12/10/2007   AORTIC VALVE DISORDERS 12/10/2007   COPD 12/10/2007   GERD 12/09/2007    ONSET DATE: referral date 11/26/23 (CVA 01/2023)  REFERRING DIAG: R29.898  (ICD-10-CM) - Hand weakness  THERAPY DIAG:  Hemiplegia and hemiparesis following cerebral infarction affecting right dominant side (HCC)  Muscle weakness (generalized)  Other lack of coordination  Rationale for Evaluation and Treatment: Rehabilitation  SUBJECTIVE:   SUBJECTIVE STATEMENT: Pt reporting that she has had some upset stomach today.    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 No - but does report that she has intermittent pain in her R thumb (arthritis)  FALLS: Has patient fallen in last 6 months? No  LIVING ENVIRONMENT: Lives with: lives alone (has healthcare aids that stay with her overnight) Lives in: House/apartment Stairs: 2 steps to enter ; split level home; bedroom on 2nd floor - 6 steps to access Has following equipment at home: Single point cane, shower chair, Grab bars, and handicap height toilet seats  PLOF: Independent, Independent with basic ADLs, and Leisure: member of a few local organizations, enjoy crafting  PATIENT GOALS: to be able to use dominant UE more independently and resume crafts  OBJECTIVE:  Note: Objective measures were completed at Evaluation unless otherwise noted.  HAND DOMINANCE: Right  ADLs:  Overall ADLs: reports increased time to get dressed Transfers/ambulation related to ADLs: utilizing [facility] Eating: some difficulty with scooping and cutting foods Grooming: enjoys wearing makeup, requiring increased time UB Dressing: difficulty with buttons, zipping dress zippers (in back) LB Dressing: does have difficulty with tying shoes but primarily wearing slip on shoes Toileting: Mod I Bathing: Mod I Tub Shower transfers: increased time and effort when exiting bathtub/shower Equipment: Shower seat with back and Grab bars  IADLs:  Light  housekeeping: healthcare aids help with mopping, cleaning bathroom, taking out the trash Meal Prep: minimal cooking, will cook soups or bring in food Community mobility: still driving, however not driving at night time Medication management: utilizing pill box  Financial management: daughter is completing Handwriting: 100% legible and Increased time - requiring 28.19 sec to write [place].  MOBILITY STATUS: Needs Assist: Utilizing [facility] for mobility  POSTURE COMMENTS:  rounded shoulders and forward head  ACTIVITY TOLERANCE: Activity tolerance: diminished  FUNCTIONAL OUTCOME MEASURES: PSFS: 3.0   UPPER EXTREMITY ROM:    Active ROM Right eval Left eval  Shoulder flexion 88 WFL  Shoulder abduction    Shoulder adduction    Shoulder extension    Shoulder internal rotation Able to reach to back pocket, reports mild tingling at end range [facility]  Shoulder external rotation WFL (reports tingling at end range) [facility]  Elbow flexion    Elbow extension    Wrist flexion    Wrist extension    Wrist ulnar deviation    Wrist radial deviation    Wrist pronation    Wrist supination    (Blank rows = not tested)  UPPER EXTREMITY MMT:     MMT Right eval Left eval  Shoulder flexion 4+ 3-  Shoulder abduction    Shoulder adduction    Shoulder extension    Shoulder internal rotation    Shoulder external rotation    Middle trapezius    Lower trapezius    Elbow flexion    Elbow extension    Wrist flexion    Wrist extension    Wrist ulnar deviation    Wrist radial deviation    Wrist pronation    Wrist supination    (Blank rows = not tested)  HAND FUNCTION: Grip strength: Right: 29 lbs; Left: 36 lbs  COORDINATION: 9 Hole Peg test: Right: 47.15 sec; Left: 42.72 sec Box and Blocks:  Right 35 blocks, Left 40 blocks 3 button/unbutton: 58.79 sec  SENSATION: WFL  EDEMA: NA   COGNITION: Overall cognitive status: Within functional limits for tasks  assessed  VISION: Subjective report: no changes Baseline vision: Wears glasses all the time  VISION ASSESSMENT: Not tested                                                                                                                             TREATMENT DATE:  01/28/24 Grooved Pegs: with RUE for increased coordination. Pt placed pegs with  one at a time and in hand manipulation and removed with in hand manipulation. Pt completed with min to no difficulty when completing one at a time, however moderate difficulty with in-hand manipulation and dropping pegs x3 during removal of pegs.   Threading beads: with RUE, initially completing by threading one bead at a time and progressing with in-hand manipulation to hold up to 6 beads and translate palm to finger tips to place onto string.  Pt dropping x1 when translating palm to finger tips.   Handwriting: engaged in writing 2-3 sentence paragraph.  Pt demonstrating somewhat smaller sizing but with good legibility.  Reiterated use of lined paper as able and taking breaks to increase quality of writing.  Discussed potential benefit from slightly weighted writing utensils as well.     01/22/24 Coordination: engaged in peg board pattern replication with RUE with focus on picking up and placing pegs one at a time into grid.  OT then increased challenge to completing with in-hand manipulation - picking up 9 pegs and then translating from palm to finger tips.  Pt with no drops completing one at a time, however dropping x2 when translating from palm to finger tips.  Pt then removing with in-hand manipulation with no drops when translating back into palm. Handwriting: engaged in writing 10-15 item grocery list.  Pt demonstrating good sizing and legibility, however does continue to decrease just a bit as she fatigues.  OT reiterating use of lined paper as able and taking breaks if she notices fatigue or decrease in quality of  writing.   01/18/24 Coordination: w/RUE, including: picking up various small objects/coins and trying to see how many pt is able to hold in-hand without drops, and picking up 5-10 coins 1 at a time and translating palm to fingertips to place into coin slot.  Pt demonstrating mild improvements with picking up larger coins, still with difficulty with smaller coins due to fingernails.   9 hole peg test: Right: 35.81 sec; Left: 34.62 sec Typing: OT educating pt on various online typing programs.  Pt completing 1 min of typing at 11WPM with 1 error = 10 WPM.  Completed 2 mins with pt demonstrating improved speed however with increased errors yielding 14 WPM with 10 typos = 9 WPM.  Transitioned to common words instead of passages with pt completing with improved speed and accuracy with 26 WPM and 96% accuracy and then 22 WPM and 94% accuracy.  Pt pleased with availability of typing practice at home.   PATIENT EDUCATION: Education details: coordination, handwriting Person educated: Patient Education method: Explanation, Demonstration, and Handouts Education comprehension: verbalized understanding and needs further education  HOME EXERCISE PROGRAM: Coordination HEP - See pt instructions  Access Code: [email or web address] URL: [email or web address] Date: 01/16/2024 Prepared by: [facility] - Outpatient  Rehab - [facility]  Exercises - Seated Shoulder Blade Squeeze  - 2 x daily - 2 sets - 10 reps - Shoulder Flexion Overhead with Dowel  - 2 x daily - 2 sets - 10 reps - Seated Shoulder Flexion AAROM with Dowel  - 2 x daily - 2 sets - 10 reps - Seated Shoulder Abduction AAROM with Dowel  - 2 x daily - 2 sets - 10 reps - Seated Shoulder External Rotation AAROM with Cane and Hand in Neutral  - 2 x daily - 2 sets - 10 reps   GOALS: Goals reviewed with patient? Yes  SHORT TERM GOALS: Target date: 01/18/24  Pt will be independent in ROM and coordination  HEP. Baseline: decreased ROM,  strength, and coordination of dominant UE Goal status: in progress  2.  Pt will verbalize understanding of task modifications and/or potential A/E needs to increase ease, safety, and independence w/ ADLs. Baseline: difficulty with tying shoes, buttoning buttons, putting on jewelry and makeup Goal status:  in progress  3.  Pt will demonstrate improved fine motor coordination for ADLs as evidenced by decreasing 9 hole peg test score for RUE by 3 secs Baseline: 9 Hole Peg test: Right: 47.15 sec; Left: 42.72 sec 01/18/24: Right: 35.81 sec; Left: 34.62 sec Goal status:  MET  4.  Pt will verbalizing understanding of typing programs and/or adaptive techniques to allow for increased ease with computer use (voice to text). Baseline:  01/18/24: instructed on typing websites with pt demonstrating good hand placement with decreasing errors with focus on one word at a time Goal status:  in progress   LONG TERM GOALS: Target date: 02/08/23  Pt will demonstrate improved ease with fastening buttons as evidenced by decreasing 3 button/ unbutton time to 35 seconds or less with use of AE PRN. Baseline: 58 sec Goal status:  in progress  2.  Pt will write a short paragraph with 100% legibility and no significant decrease in quality of writing. Baseline: decreased quality from start to finish Goal status:  in progress  3.  Pt will demonstrate improved fine motor coordination for ADLs as evidenced by decreasing 9 hole peg test score for RUE by 6 secs Baseline: 9 Hole Peg test: Right: 47.15 sec; Left: 42.72 sec Goal status:  in progress  4.  Pt will demonstrate ability to retrieve a lightweight object at 110* shoulder flexion for increased ROM as needed for ADLs and IADLs. Baseline: 88* Goal status:  in progress  5.  Patient will report at least two-point increase in average PSFS score or at least three-point increase in a single activity score indicating functionally significant improvement given minimum  detectable change. Baseline: 3.0 Goal status:  in progress  ASSESSMENT:  CLINICAL IMPRESSION: Patient is a 89 y.o. female who was seen today for occupational therapy treatment for ongoing RUE weakness s/p CVA in January 2025.  Pt demonstrating good effort with coordination tasks this session, demonstrating improved in-hand manipulation with translation of beads, some difficulty with grooved pegs due to amount of pegs held in hand.  Pt demonstrating consistency in sizing with handwriting, however reporting smaller than her previous handwriting. Pt voicing understanding of weighted pen vs built up handle as well as use of lined paper when possible and taking breaks when fatigued. Pt will continue to benefit from skilled occupational therapy services to address strength and coordination, ROM, pain management, balance, GM/FM control, safety awareness, introduction of compensatory strategies/AE prn, and implementation of an HEP to improve participation and safety during ADLs and IADLs.    PERFORMANCE DEFICITS: in functional skills including ADLs, IADLs, coordination, ROM, strength, pain, Fine motor control, Gross motor control, body mechanics, decreased knowledge of precautions, decreased knowledge of use of DME, and UE functional use and psychosocial skills including coping strategies, environmental adaptation, and routines and behaviors.      PLAN:  OT FREQUENCY: 2x/week  OT DURATION: 6 weeks  PLANNED INTERVENTIONS: 97168 OT Re-evaluation, 97535 self care/ADL training, 02889 therapeutic exercise, 97530 therapeutic activity, 97112 neuromuscular re-education, 97035 ultrasound, [id number] electrical stimulation (manual), passive range of motion, energy conservation, coping strategies training, patient/family education, and DME and/or AE instructions  RECOMMENDED OTHER SERVICES: NA  CONSULTED AND AGREED WITH PLAN  OF CARE: Patient  PLAN FOR NEXT SESSION: review and have pt complete coordination HEP,  incorporate shoulder, review handwriting and typing practice  Complete buttons and educate on strategies to aid in zippers on dresses and jewelry fasteners.    KAYLENE DOMINO, OTR/L 01/28/2024, 2:07 PM   [facility] at [facility] [address] [place], [address] [place], [place] [address] Phone # [phone] Fax # [phone]         "

## 2024-01-30 ENCOUNTER — Ambulatory Visit

## 2024-01-30 DIAGNOSIS — R29898 Other symptoms and signs involving the musculoskeletal system: Secondary | ICD-10-CM

## 2024-01-30 DIAGNOSIS — M6281 Muscle weakness (generalized): Secondary | ICD-10-CM

## 2024-01-30 DIAGNOSIS — R2689 Other abnormalities of gait and mobility: Secondary | ICD-10-CM | POA: Diagnosis not present

## 2024-01-30 DIAGNOSIS — R278 Other lack of coordination: Secondary | ICD-10-CM

## 2024-01-30 DIAGNOSIS — I69351 Hemiplegia and hemiparesis following cerebral infarction affecting right dominant side: Secondary | ICD-10-CM

## 2024-01-30 DIAGNOSIS — R2681 Unsteadiness on feet: Secondary | ICD-10-CM

## 2024-01-30 NOTE — Patient Instructions (Signed)
 SABRA

## 2024-01-30 NOTE — Therapy (Signed)
 " OUTPATIENT PHYSICAL THERAPY NEURO TREATMENT NOTE   Patient Name: Lynn Ray MRN: [id number] DOB:02/11/1934, 89 y.o., female Today's Date: 01/30/2024   PCP: Rollene Almarie LABOR, MD  REFERRING PROVIDER: Rollene Almarie LABOR, MD   END OF SESSION:  PT End of Session - 01/30/24 1613     Visit Number 13    Number of Visits 19    Date for Recertification  02/05/24    Authorization Type [employer]    Authorization Time Period 8 visits 10/24-12/26;12 additional PT visits from 01/03/2024-02/05/2024    Authorization - Visit Number 5    Authorization - Number of Visits 12    Progress Note Due on Visit 17    PT Start Time 1615    PT Stop Time 1700    PT Time Calculation (min) 45 min    Equipment Utilized During Treatment Gait belt    Activity Tolerance Patient tolerated treatment well    Behavior During Therapy WFL for tasks assessed/performed                 Past Medical History:  Diagnosis Date   Arthritis    Cancer (HCC) 2004   rectal cancer   Cataract    right eye   Chiari malformation    CKD (chronic kidney disease), stage IV (HCC)    Depression    Diabetes mellitus (HCC)    pre   Hypertension    Sleep apnea    no CPAP   Thyroid  disease    hyporthyroid   Past Surgical History:  Procedure Laterality Date   CATARACT EXTRACTION Bilateral    chiari malformation  2013   surgery   COLONOSCOPY     FLEXIBLE SIGMOIDOSCOPY     POLYPECTOMY     Patient Active Problem List   Diagnosis Date Noted   Sinus pause 07/06/2023   Aphasia, late effect of cerebrovascular disease 04/27/2023   Hx of completed stroke 03/16/2023   Anemia 02/23/2023   Vision changes 04/18/2022   Pre-diabetes 09/29/2020   Stage 4 chronic kidney disease (HCC) 12/22/2019   History of Chiari malformation 10/09/2017   Chronic right shoulder pain 10/09/2017   Routine general medical examination at a health care facility 07/13/2017   History of malignant neoplasm of large intestine  11/18/2010   BARRETT'S ESOPHAGUS 01/14/2008   Hypothyroidism 12/10/2007   Morbid obesity (HCC) 12/10/2007   Essential hypertension 12/10/2007   AORTIC VALVE DISORDERS 12/10/2007   COPD 12/10/2007   GERD 12/09/2007    ONSET DATE: 02/22/23  REFERRING DIAG: S13.26 (ICD-10-CM) - Hx of completed stroke Rollene Almarie LABOR, MD  THERAPY DIAG:  Muscle weakness (generalized)  Other abnormalities of gait and mobility  Unsteadiness on feet  Rationale for Evaluation and Treatment: Rehabilitation  SUBJECTIVE:  SUBJECTIVE STATEMENT: Doing well, no new issues  Pt accompanied by: family member  PERTINENT HISTORY: Rectal CA, chiari malformation s/p surgery, CKD IV, depression, DM, HTN,   PAIN:  Are you having pain? Yes: NPRS scale: 3-4/10 Pain location: L side of neck radiating to L elbow Pain description: sharp, N/T Aggravating factors: driving, reaching overhead Relieving factors: nothing   PRECAUTIONS: Fall  RED FLAGS: None   WEIGHT BEARING RESTRICTIONS: No  FALLS: Has patient fallen in last 6 months? Yes. Number of falls 1  LIVING ENVIRONMENT: Lives with: lives alone and has a PCA and daughter that stays with her on nights  Lives in: House/apartment Stairs: 2 steps to enter ; split level home; bedroom on 2nd floor Has following equipment at home: Single point cane, Shower bench, and Grab bars  PLOF: Independent with basic ADLs and Leisure: crafts; PCA helps with more vigorous chores   PATIENT GOALS: get back to normal as much as possible   OBJECTIVE:   TODAY'S TREATMENT: 01/30/24 Activity Comments  NU-step incr resistance levels x 8 min 2 min warm-up at 5. Incr resist by 1 q minute (to level 9) 2 min cool down level 4  Dynamic gait -walking carrying precarious items level  surfaces -stepping over obstacles carrying precarious   Static multisensory balance   Standing foot lift on box 1x10 w/ and without UE support            TODAY'S TREATMENT: 01/22/24 Activity Comments  Dynamic balance -forward march/retrowalk x 1 min counter support -sidestep x 1 min BUE support -4-square step -/+ obstacles: CGA for support  Static balance -feet together EC x 30 sec then head turns EC x 30 sec Semi-tandem x 15 sec then head movements on firm and foam  Pt education regarding [employer] programs in the area and relevant classes that would be beneficial , e.g. Sit and be fit, chair yoga, strength/balance. Education on benefits of exercise as it pertains to reduce cardiovascular risk   NU-step level 5 x 8 min To improve endurance             TODAY'S TREATMENT: 01/10/24 Activity Comments  Nustep L4 x 7 min LEs only Dynamic warm up   mini squats to mat 2x10  Pushing backs of legs into mat for stability   gait training with SPC, matching PT's changes in speed, quick stops/starts, multidirectional walking  Difficulty keeping up with PT's pace; apprehension walking backwards   sidestepping over hurdle Weaned to 1 UE support  Fwd stepping over over hurdle Wean to using cane only; good stability     PATIENT EDUCATION: Education details: discussed pt's experience with aquatic PT and reiterated recommendation to have family member/caregiver escort her to the pool [facility]- pt reports that she does not have family in the area that would be available. She states that she can check with her aids. Advised patient to call PCP if she starts noticing worsening N/T or UE weakness d/t concern for cervical radiculopathy  Person educated: Patient Education method: Explanation Education comprehension: verbalized understanding    HOME EXERCISE PROGRAM Access Code: [email or web address] URL: [email or web address] Date: 01/01/2024 Prepared by: [facility]  Program Notes perform standing exercises at counter for safety  Exercises - Heel Toe Raises with Counter Support  - 1 x daily - 5 x weekly - 2 sets - 10 reps - Standing Toe Taps  - 1 x daily - 5 x weekly - 2 sets -  10 reps - Alternating Step Forward with Support  - 1 x daily - 5 x weekly - 2 sets - 10 reps - Standing March with Counter Support  - 1 x daily - 5 x weekly - 2 sets - 20 reps - Seated Hip Adduction Isometrics with Ball  - 1 x daily - 5 x weekly - 2 sets - 10 reps - 5 sec hold - Seated Long Arc Quad  - 1 x daily - 5 x weekly - 2 sets - 10 reps - Seated March with Resistance  - 1 x daily - 5 x weekly - 3 sets - 10 reps - Seated Ankle Dorsiflexion with Resistance  - 1 x daily - 5 x weekly - 3 sets - 10 reps - Standing Foot Lift on Box (BKA)  - 3 x weekly - 1-2 sets - 10 reps  PATIENT EDUCATION: Education details: Update to HEP for seated resisted exercise; pt reports she verbalizes understanding on aquatic instructions  Person educated: Patient Education method: Explanation, Demonstration, Tactile cues, Verbal cues, and Handouts Education comprehension: verbalized understanding and returned demonstration           ------------------------------------- Note: Objective measures were completed at Evaluation unless otherwise noted.  DIAGNOSTIC FINDINGS: none recent  COGNITION: Overall cognitive status: Within functional limits for tasks assessed   SENSATION: Pt reports R>L hand numbness   COORDINATION: Alternating pronation/supination: slowed B Alternating toe tap: WNL B Finger to nose: slight dysmetria R>L   MUSCLE TONE: WNL B LEs  POSTURE: rounded shoulders and forward head  LOWER EXTREMITY ROM:     Active  Right Eval Left Eval  Hip flexion    Hip extension    Hip abduction    Hip adduction    Hip internal rotation    Hip external rotation    Knee flexion    Knee extension    Ankle dorsiflexion 15 10  Ankle plantarflexion     Ankle inversion    Ankle eversion     (Blank rows = not tested)  LOWER EXTREMITY MMT:    MMT  Right Eval Left Eval  Hip flexion 4 4+  Hip extension    Hip abduction 4+ 4+  Hip adduction 4 4  Hip internal rotation    Hip external rotation    Knee flexion 4 4  Knee extension 4 4  Ankle dorsiflexion 3+ 4  Ankle plantarflexion 4 4  Ankle inversion    Ankle eversion    (Blank rows = not tested)  GAIT: Findings: Assistive device utilized:Single point cane, Level of assistance: SBA, and Comments: slowed, foot flat B and shuffling gait   FUNCTIONAL TESTS:  5 times sit to stand: 22.04 sec without standing fully and pushing off knees 10 meter walk test: 22.59 sec (1.45 ft/sec)                                                                                            HOME EXERCISE PROGRAM: Not yet initiated  GOALS: Goals reviewed with patient? Yes  SHORT TERM GOALS: Target date: 12/21/2023  Patient to be independent with initial HEP. Baseline:  HEP initiated Goal status: MET    LONG TERM GOALS: Target date: 02/05/24  Patient to be independent with advanced HEP. Baseline: Not yet initiated  Goal status: IN PROGRESS  Patient to demonstrate B LE strength >/=4+/5.  Baseline: See above; improved in R LE, not quite met 12/25/23  Goal status: IN PROGRESS 12/25/23   Patient to demonstrate 30% improvement in foot clearance with gait.  Baseline: foot flat B; improved initially by 50% but quickly fatigues 12/25/23  Goal status: IN PROGRESS 12/25/23  Patient to demonstrate gait speed of at least 2 ft/sec in order to improve falls risk.  Baseline: 1.45 ft/sec; 1.82 ft/sec 12/25/23 Goal status: IN PROGRESS 12/25/23  Patient to demonstrate 5xSTS test in <15 sec in order to decrease risk of falls.  Baseline: 22.05; 19.34 sec pushing off knees 12/25/23  Goal status: IN PROGRESS 12/25/23    ASSESSMENT:  CLINICAL IMPRESSION: Focus on dynamic standing balance to improve  postural control with unsupported standing and performing tasks to emphasize single limb support such as stepping over obstacles or walking with multitasking aspects dividing attention.  Great performance to static multisensory balance demonstrating normal-mild sway with challenges such as standing on compliant eyes closed and performing head movements.  Instructed in HEP addition for improving LE strength and single limb support to improve safety with stepping over obstacles or negotiating higher stairs where initially required BUE support weaned to no UE support with good control maintained. Continued sessions to progress POC details to improve mobility and reduce fall risk OBJECTIVE IMPAIRMENTS: Abnormal gait, decreased activity tolerance, decreased balance, decreased coordination, decreased endurance, difficulty walking, decreased strength, postural dysfunction, and pain.   ACTIVITY LIMITATIONS: carrying, lifting, bending, sitting, standing, squatting, sleeping, stairs, transfers, bed mobility, bathing, toileting, dressing, reach over head, hygiene/grooming, and locomotion level  PARTICIPATION LIMITATIONS: meal prep, shopping, community activity, and church  PERSONAL FACTORS: Age, Past/current experiences, Time since onset of injury/illness/exacerbation, and 3+ comorbidities: Rectal CA, chiari malformation s/p surgery, CKD IV, depression, DM, HTN,  are also affecting patient's functional outcome.   REHAB POTENTIAL: Good  CLINICAL DECISION MAKING: Evolving/moderate complexity  EVALUATION COMPLEXITY: Moderate  PLAN:  PT FREQUENCY: 2x/week  PT DURATION: 6 weeks  PLANNED INTERVENTIONS: 97110-Therapeutic exercises, 97530- Therapeutic activity, [id number]- Neuromuscular re-education, 97535- Self Care, [id number]- Manual therapy, [id number]- Gait training, [phone]- Orthotic Initial, [phone]- Canalith repositioning, [phone]- Aquatic Therapy, [phone]- Electrical stimulation (manual), Patient/Family education, Balance  training, Stair training, Taping, Vestibular training, Cryotherapy, and Moist heat  PLAN FOR NEXT SESSION:  Review and progress HEP for RLE strength, balance, work on speed of movement, transfers, foot clearance/heelstrike with gait; PLEASE RECOMMEND CAREGIVER COME WITH PATIENT TO POOL (I talked to her about this but she doesn't have anyone to come with her.  She seemed to get back here a little better from a cognitive standpoint but she was still fatigued.  I just told her to take her time and sit where needed on the way out - difficulty navigating DWB alone in addition to fatigue when exiting pool; core engagement/unsupported sitting    AQUATICS: Frequency: 1 Duration: 4 weeks Special Instruction: endurance, LE strength; pt has mild R hemplegia      4:13 PM, 01/30/2024 M. Kelly Choice Kleinsasser, PT, DPT Physical Therapist- [place] Office Number: [phone]     "

## 2024-01-30 NOTE — Therapy (Signed)
 " OUTPATIENT OCCUPATIONAL THERAPY NEURO  Treatment Note  Patient Name: Lynn Ray MRN: [id number] DOB:03/28/1934, 89 y.o., female Today's Date: 01/30/2024  PCP: Rollene Almarie LABOR, MD REFERRING PROVIDER: Rollene Almarie LABOR, MD  END OF SESSION:  OT End of Session - 01/30/24 1633     Visit Number 7    Number of Visits 13    Date for Recertification  02/08/24    Authorization Type Auth#: [id number] , approved 13 OT visits from 12/25/2023-03/24/2024.**  [employer] 2025    OT Start Time 1535    OT Stop Time 1616    OT Time Calculation (min) 41 min    Equipment Utilized During Treatment cards, jenga    Activity Tolerance Patient tolerated treatment well    Behavior During Therapy WFL for tasks assessed/performed               Past Medical History:  Diagnosis Date   Arthritis    Cancer (HCC) 2004   rectal cancer   Cataract    right eye   Chiari malformation    CKD (chronic kidney disease), stage IV (HCC)    Depression    Diabetes mellitus (HCC)    pre   Hypertension    Sleep apnea    no CPAP   Thyroid  disease    hyporthyroid   Past Surgical History:  Procedure Laterality Date   CATARACT EXTRACTION Bilateral    chiari malformation  2013   surgery   COLONOSCOPY     FLEXIBLE SIGMOIDOSCOPY     POLYPECTOMY     Patient Active Problem List   Diagnosis Date Noted   Sinus pause 07/06/2023   Aphasia, late effect of cerebrovascular disease 04/27/2023   Hx of completed stroke 03/16/2023   Anemia 02/23/2023   Vision changes 04/18/2022   Pre-diabetes 09/29/2020   Stage 4 chronic kidney disease (HCC) 12/22/2019   History of Chiari malformation 10/09/2017   Chronic right shoulder pain 10/09/2017   Routine general medical examination at a health care facility 07/13/2017   History of malignant neoplasm of large intestine 11/18/2010   BARRETT'S ESOPHAGUS 01/14/2008   Hypothyroidism 12/10/2007   Morbid obesity (HCC) 12/10/2007   Essential hypertension  12/10/2007   AORTIC VALVE DISORDERS 12/10/2007   COPD 12/10/2007   GERD 12/09/2007    ONSET DATE: referral date 11/26/23 (CVA 01/2023)  REFERRING DIAG: R29.898 (ICD-10-CM) - Hand weakness  THERAPY DIAG:  Hemiplegia and hemiparesis following cerebral infarction affecting right dominant side (HCC)  Muscle weakness (generalized)  Other lack of coordination  Hand weakness  Rationale for Evaluation and Treatment: Rehabilitation  SUBJECTIVE:   SUBJECTIVE STATEMENT: Pt reports she believes her RUE is getting better, I can lift it higher.   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 No - but does report that she has intermittent pain in her R thumb (arthritis)  FALLS: Has patient fallen in last 6 months? No  LIVING ENVIRONMENT: Lives with: lives alone (has healthcare aids that stay with her overnight) Lives in: House/apartment Stairs: 2 steps to enter ; split level home; bedroom on 2nd floor - 6 steps to access Has following equipment at home: Single point cane, shower chair, Grab bars, and handicap height toilet seats  PLOF: Independent, Independent with basic ADLs, and Leisure: member of a few local organizations, enjoy  crafting  PATIENT GOALS: to be able to use dominant UE more independently and resume crafts  OBJECTIVE:  Note: Objective measures were completed at Evaluation unless otherwise noted.  HAND DOMINANCE: Right  ADLs: Overall ADLs: reports increased time to get dressed Transfers/ambulation related to ADLs: utilizing [facility] Eating: some difficulty with scooping and cutting foods Grooming: enjoys wearing makeup, requiring increased time UB Dressing: difficulty with buttons, zipping dress zippers (in back) LB Dressing: does have difficulty with tying shoes but primarily  wearing slip on shoes Toileting: Mod I Bathing: Mod I Tub Shower transfers: increased time and effort when exiting bathtub/shower Equipment: Shower seat with back and Grab bars  IADLs:  Light housekeeping: healthcare aids help with mopping, cleaning bathroom, taking out the trash Meal Prep: minimal cooking, will cook soups or bring in food Community mobility: still driving, however not driving at night time Medication management: utilizing pill box  Financial management: daughter is completing Handwriting: 100% legible and Increased time - requiring 28.19 sec to write [place].  MOBILITY STATUS: Needs Assist: Utilizing [facility] for mobility  POSTURE COMMENTS:  rounded shoulders and forward head  ACTIVITY TOLERANCE: Activity tolerance: diminished  FUNCTIONAL OUTCOME MEASURES: PSFS: 3.0   UPPER EXTREMITY ROM:    Active ROM Right eval Left eval  Shoulder flexion 88 WFL  Shoulder abduction    Shoulder adduction    Shoulder extension    Shoulder internal rotation Able to reach to back pocket, reports mild tingling at end range [facility]  Shoulder external rotation WFL (reports tingling at end range) [facility]  Elbow flexion    Elbow extension    Wrist flexion    Wrist extension    Wrist ulnar deviation    Wrist radial deviation    Wrist pronation    Wrist supination    (Blank rows = not tested)  UPPER EXTREMITY MMT:     MMT Right eval Left eval  Shoulder flexion 4+ 3-  Shoulder abduction    Shoulder adduction    Shoulder extension    Shoulder internal rotation    Shoulder external rotation    Middle trapezius    Lower trapezius    Elbow flexion    Elbow extension    Wrist flexion    Wrist extension    Wrist ulnar deviation    Wrist radial deviation    Wrist pronation    Wrist supination    (Blank rows = not tested)  HAND FUNCTION: Grip strength: Right: 29 lbs; Left: 36 lbs  COORDINATION: 9 Hole Peg test: Right: 47.15 sec; Left: 42.72 sec Box  and Blocks:  Right 35 blocks, Left 40 blocks 3 button/unbutton: 58.79 sec  SENSATION: WFL  EDEMA: NA   COGNITION: Overall cognitive status: Within functional limits for tasks assessed  VISION: Subjective report: no changes Baseline vision: Wears glasses all the time  VISION ASSESSMENT: Not tested  TREATMENT DATE:  01/30/24  - Self-care/home management completed for duration as noted below including: Educated pt in use of button hook for improved ease in dressing. Pt completed buttoning of dress shirt for practice, pt reports that's a lot easier. Educated in where to obtain AE.  Educated pt in where to obtain magnetic clasps for improved ease in donning/doffing jewelry. - Therapeutic activities completed for duration as noted below including: Patient used R hand to assemble small Jenga tower, and then removed 15 pieces individually until the tower fell over for fine motor coordination of affected extremity. Educated pt in manufacturing systems engineer for working RUE FM coordination further, handout provided with pictorial and written instruction. Pt demonstrated understanding. - Therapeutic exercises completed for duration as noted below including: Pt educated in shoulder flexion gravity eliminated with washcloth and shoulder abduction gravity eliminated with washcloth to improve RUE ROM.   01/28/24 Grooved Pegs: with RUE for increased coordination. Pt placed pegs with one at a time and in hand manipulation and removed with in hand manipulation. Pt completed with min to no difficulty when completing one at a time, however moderate difficulty with in-hand manipulation and dropping pegs x3 during removal of pegs.   Threading beads: with RUE, initially completing by threading one bead at a time and progressing with in-hand manipulation to hold up to 6 beads and translate palm  to finger tips to place onto string.  Pt dropping x1 when translating palm to finger tips.   Handwriting: engaged in writing 2-3 sentence paragraph.  Pt demonstrating somewhat smaller sizing but with good legibility.  Reiterated use of lined paper as able and taking breaks to increase quality of writing.  Discussed potential benefit from slightly weighted writing utensils as well.     01/22/24 Coordination: engaged in peg board pattern replication with RUE with focus on picking up and placing pegs one at a time into grid.  OT then increased challenge to completing with in-hand manipulation - picking up 9 pegs and then translating from palm to finger tips.  Pt with no drops completing one at a time, however dropping x2 when translating from palm to finger tips.  Pt then removing with in-hand manipulation with no drops when translating back into palm. Handwriting: engaged in writing 10-15 item grocery list.  Pt demonstrating good sizing and legibility, however does continue to decrease just a bit as she fatigues.  OT reiterating use of lined paper as able and taking breaks if she notices fatigue or decrease in quality of writing.   PATIENT EDUCATION: Education details: coordination, handwriting Person educated: Patient Education method: Explanation, Demonstration, and Handouts Education comprehension: verbalized understanding and needs further education  HOME EXERCISE PROGRAM: Coordination HEP - See pt instructions  Access Code: [email or web address] URL: [email or web address] Date: 01/16/2024 Prepared by: [facility] - Outpatient  Rehab - [facility]  Exercises - Seated Shoulder Blade Squeeze  - 2 x daily - 2 sets - 10 reps - Shoulder Flexion Overhead with Dowel  - 2 x daily - 2 sets - 10 reps - Seated Shoulder Flexion AAROM with Dowel  - 2 x daily - 2 sets - 10 reps - Seated Shoulder Abduction AAROM with Dowel  - 2 x daily - 2 sets - 10 reps - Seated Shoulder External Rotation AAROM  with Cane and Hand in Neutral  - 2 x daily - 2 sets - 10 reps   GOALS: Goals reviewed with patient? Yes  SHORT TERM GOALS: Target date: 01/18/24  Pt will be  independent in ROM and coordination HEP. Baseline: decreased ROM, strength, and coordination of dominant UE Goal status: in progress  2.  Pt will verbalize understanding of task modifications and/or potential A/E needs to increase ease, safety, and independence w/ ADLs. Baseline: difficulty with tying shoes, buttoning buttons, putting on jewelry and makeup Goal status:  in progress  3.  Pt will demonstrate improved fine motor coordination for ADLs as evidenced by decreasing 9 hole peg test score for RUE by 3 secs Baseline: 9 Hole Peg test: Right: 47.15 sec; Left: 42.72 sec 01/18/24: Right: 35.81 sec; Left: 34.62 sec Goal status:  MET  4.  Pt will verbalizing understanding of typing programs and/or adaptive techniques to allow for increased ease with computer use (voice to text). Baseline:  01/18/24: instructed on typing websites with pt demonstrating good hand placement with decreasing errors with focus on one word at a time Goal status:  in progress   LONG TERM GOALS: Target date: 02/08/23  Pt will demonstrate improved ease with fastening buttons as evidenced by decreasing 3 button/ unbutton time to 35 seconds or less with use of AE PRN. Baseline: 58 sec Goal status: in progress  2.  Pt will write a short paragraph with 100% legibility and no significant decrease in quality of writing. Baseline: decreased quality from start to finish Goal status:  in progress  3.  Pt will demonstrate improved fine motor coordination for ADLs as evidenced by decreasing 9 hole peg test score for RUE by 6 secs Baseline: 9 Hole Peg test: Right: 47.15 sec; Left: 42.72 sec Goal status:  in progress  4.  Pt will demonstrate ability to retrieve a lightweight object at 110* shoulder flexion for increased ROM as needed for ADLs and IADLs. Baseline:  88* Goal status:  in progress  5.  Patient will report at least two-point increase in average PSFS score or at least three-point increase in a single activity score indicating functionally significant improvement given minimum detectable change. Baseline: 3.0 Goal status:  in progress  ASSESSMENT:  CLINICAL IMPRESSION: Patient is a 89 y.o. female who was seen today for occupational therapy treatment for ongoing RUE weakness s/p CVA in January 2025.  Pt participated well with coordination tasks and reports completion of HEPs at home and practicing FM coordination further. Pt will continue to benefit from skilled occupational therapy services to address strength and coordination, ROM, pain management, balance, GM/FM control, safety awareness, introduction of compensatory strategies/AE prn, and implementation of an HEP to improve participation and safety during ADLs and IADLs.    PERFORMANCE DEFICITS: in functional skills including ADLs, IADLs, coordination, ROM, strength, pain, Fine motor control, Gross motor control, body mechanics, decreased knowledge of precautions, decreased knowledge of use of DME, and UE functional use and psychosocial skills including coping strategies, environmental adaptation, and routines and behaviors.      PLAN:  OT FREQUENCY: 2x/week  OT DURATION: 6 weeks  PLANNED INTERVENTIONS: 97168 OT Re-evaluation, 97535 self care/ADL training, 02889 therapeutic exercise, 97530 therapeutic activity, 97112 neuromuscular re-education, 97035 ultrasound, 97032 electrical stimulation (manual), passive range of motion, energy conservation, coping strategies training, patient/family education, and DME and/or AE instructions  RECOMMENDED OTHER SERVICES: NA  CONSULTED AND AGREED WITH PLAN OF CARE: Patient  PLAN FOR NEXT SESSION: f/u golf solitaire RUE ROM activities/exercises Coordination activities F/u obtaining button hook    Rocky Dutch, OTR/L 01/30/2024, 4:34  PM   [facility] [address] [place], [address] [place], [place]  [address] Phone # [phone] Fax # [phone]         "

## 2024-02-04 ENCOUNTER — Ambulatory Visit: Admitting: Occupational Therapy

## 2024-02-06 ENCOUNTER — Ambulatory Visit

## 2024-02-06 ENCOUNTER — Ambulatory Visit: Attending: Internal Medicine | Admitting: Occupational Therapy

## 2024-02-06 DIAGNOSIS — R278 Other lack of coordination: Secondary | ICD-10-CM | POA: Insufficient documentation

## 2024-02-06 DIAGNOSIS — M6281 Muscle weakness (generalized): Secondary | ICD-10-CM | POA: Insufficient documentation

## 2024-02-06 DIAGNOSIS — R4701 Aphasia: Secondary | ICD-10-CM | POA: Insufficient documentation

## 2024-02-06 DIAGNOSIS — I69351 Hemiplegia and hemiparesis following cerebral infarction affecting right dominant side: Secondary | ICD-10-CM | POA: Insufficient documentation

## 2024-02-06 NOTE — Therapy (Signed)
 " OUTPATIENT OCCUPATIONAL THERAPY NEURO  Treatment Note  Patient Name: Lynn Ray MRN: [id number] DOB:26-Apr-1934, 89 y.o., female Today's Date: 02/06/2024  PCP: Rollene Almarie LABOR, MD REFERRING PROVIDER: Rollene Almarie LABOR, MD  END OF SESSION:         Past Medical History:  Diagnosis Date   Arthritis    Cancer [facility]) 2004   rectal cancer   Cataract    right eye   Chiari malformation    CKD (chronic kidney disease), stage IV (HCC)    Depression    Diabetes mellitus (HCC)    pre   Hypertension    Sleep apnea    no CPAP   Thyroid  disease    hyporthyroid   Past Surgical History:  Procedure Laterality Date   CATARACT EXTRACTION Bilateral    chiari malformation  2013   surgery   COLONOSCOPY     FLEXIBLE SIGMOIDOSCOPY     POLYPECTOMY     Patient Active Problem List   Diagnosis Date Noted   Sinus pause 07/06/2023   Aphasia, late effect of cerebrovascular disease 04/27/2023   Hx of completed stroke 03/16/2023   Anemia 02/23/2023   Vision changes 04/18/2022   Pre-diabetes 09/29/2020   Stage 4 chronic kidney disease (HCC) 12/22/2019   History of Chiari malformation 10/09/2017   Chronic right shoulder pain 10/09/2017   Routine general medical examination at a health care facility 07/13/2017   History of malignant neoplasm of large intestine 11/18/2010   BARRETT'S ESOPHAGUS 01/14/2008   Hypothyroidism 12/10/2007   Morbid obesity (HCC) 12/10/2007   Essential hypertension 12/10/2007   AORTIC VALVE DISORDERS 12/10/2007   COPD 12/10/2007   GERD 12/09/2007    ONSET DATE: referral date 11/26/23 (CVA 01/2023)  REFERRING DIAG: R29.898 (ICD-10-CM) - Hand weakness  THERAPY DIAG:  No diagnosis found.  Rationale for Evaluation and Treatment: Rehabilitation  SUBJECTIVE:   SUBJECTIVE STATEMENT: Pt reports that she had been experiencing some pain/tenderness to touch in the center to right side of her chest.  Pt reports that she did reach out to her PCP to seek  follow up.    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 No   FALLS: Has patient fallen in last 6 months? No  LIVING ENVIRONMENT: Lives with: lives alone (has healthcare aids that stay with her overnight) Lives in: House/apartment Stairs: 2 steps to enter ; split level home; bedroom on 2nd floor - 6 steps to access Has following equipment at home: Single point cane, shower chair, Grab bars, and handicap height toilet seats  PLOF: Independent, Independent with basic ADLs, and Leisure: member of a few local organizations, enjoy crafting  PATIENT GOALS: to be able to use dominant UE more independently and resume crafts  OBJECTIVE:  Note: Objective measures were completed at Evaluation unless otherwise noted.  HAND DOMINANCE: Right  ADLs: Overall ADLs: reports increased time to get dressed Transfers/ambulation related to ADLs: utilizing [facility] Eating: some difficulty with scooping and cutting foods Grooming: enjoys wearing makeup, requiring increased time UB Dressing: difficulty with buttons, zipping dress zippers (in back) LB Dressing: does have difficulty with tying shoes but primarily wearing slip on shoes Toileting: Mod I Bathing: Mod I Tub Shower transfers: increased time and effort when exiting bathtub/shower Equipment: Shower seat with back and Grab bars  IADLs:  Light  housekeeping: healthcare aids help with mopping, cleaning bathroom, taking out the trash Meal Prep: minimal cooking, will cook soups or bring in food Community mobility: still driving, however not driving at night time Medication management: utilizing pill box  Financial management: daughter is completing Handwriting: 100% legible and Increased time - requiring 28.19 sec to write [place].  MOBILITY  STATUS: Needs Assist: Utilizing [facility] for mobility  POSTURE COMMENTS:  rounded shoulders and forward head  ACTIVITY TOLERANCE: Activity tolerance: diminished  FUNCTIONAL OUTCOME MEASURES: PSFS: 3.0   UPPER EXTREMITY ROM:    Active ROM Right eval Left eval  Shoulder flexion 88 WFL  Shoulder abduction    Shoulder adduction    Shoulder extension    Shoulder internal rotation Able to reach to back pocket, reports mild tingling at end range [facility]  Shoulder external rotation WFL (reports tingling at end range) [facility]  Elbow flexion    Elbow extension    Wrist flexion    Wrist extension    Wrist ulnar deviation    Wrist radial deviation    Wrist pronation    Wrist supination    (Blank rows = not tested)  UPPER EXTREMITY MMT:     MMT Right eval Left eval  Shoulder flexion 4+ 3-  Shoulder abduction    Shoulder adduction    Shoulder extension    Shoulder internal rotation    Shoulder external rotation    Middle trapezius    Lower trapezius    Elbow flexion    Elbow extension    Wrist flexion    Wrist extension    Wrist ulnar deviation    Wrist radial deviation    Wrist pronation    Wrist supination    (Blank rows = not tested)  HAND FUNCTION: Grip strength: Right: 29 lbs; Left: 36 lbs  COORDINATION: 9 Hole Peg test: Right: 47.15 sec; Left: 42.72 sec Box and Blocks:  Right 35 blocks, Left 40 blocks 3 button/unbutton: 58.79 sec  SENSATION: WFL  EDEMA: NA   COGNITION: Overall cognitive status: Within functional limits for tasks assessed  VISION: Subjective report: no changes Baseline vision: Wears glasses all the time  VISION ASSESSMENT: Not tested                                                                                                                             TREATMENT DATE:  02/06/24 Self-care: Pt reporting bringing in personal BP machine and wanting to learn how to use it.  OT educated pt on proper setup and fit of home blood pressure  machine.  Due to larger circumference upper arm, educated on both proper fit at upper arm if having assistance from another person or the potential use at forearm.  OT educating on tendency for elevated BP in forearm compared to upper arm. Buttons: pt completing buttons with and without use of button hooks.  Pt demonstrating improved timing without button hook, however pt  still interested in potential benefit of button hook for home especially one with zipper aid. 9 hole peg test: 39.78 sec with right hand   01/30/24  - Self-care/home management completed for duration as noted below including: Educated pt in use of button hook for improved ease in dressing. Pt completed buttoning of dress shirt for practice, pt reports that's a lot easier. Educated in where to obtain AE.  Educated pt in where to obtain magnetic clasps for improved ease in donning/doffing jewelry. - Therapeutic activities completed for duration as noted below including: Patient used R hand to assemble small Jenga tower, and then removed 15 pieces individually until the tower fell over for fine motor coordination of affected extremity. Educated pt in manufacturing systems engineer for working RUE FM coordination further, handout provided with pictorial and written instruction. Pt demonstrated understanding. - Therapeutic exercises completed for duration as noted below including: Pt educated in shoulder flexion gravity eliminated with washcloth and shoulder abduction gravity eliminated with washcloth to improve RUE ROM.   01/28/24 Grooved Pegs: with RUE for increased coordination. Pt placed pegs with one at a time and in hand manipulation and removed with in hand manipulation. Pt completed with min to no difficulty when completing one at a time, however moderate difficulty with in-hand manipulation and dropping pegs x3 during removal of pegs.   Threading beads: with RUE, initially completing by threading one bead at a time and progressing with  in-hand manipulation to hold up to 6 beads and translate palm to finger tips to place onto string.  Pt dropping x1 when translating palm to finger tips.   Handwriting: engaged in writing 2-3 sentence paragraph.  Pt demonstrating somewhat smaller sizing but with good legibility.  Reiterated use of lined paper as able and taking breaks to increase quality of writing.  Discussed potential benefit from slightly weighted writing utensils as well.    PATIENT EDUCATION: Education details: coordination, handwriting Person educated: Patient Education method: Explanation, Demonstration, and Handouts Education comprehension: verbalized understanding and needs further education  HOME EXERCISE PROGRAM: Coordination HEP - See pt instructions  Access Code: [email or web address] URL: [email or web address] Date: 01/16/2024 Prepared by: [facility] - Outpatient  Rehab - [facility]  Exercises - Seated Shoulder Blade Squeeze  - 2 x daily - 2 sets - 10 reps - Shoulder Flexion Overhead with Dowel  - 2 x daily - 2 sets - 10 reps - Seated Shoulder Flexion AAROM with Dowel  - 2 x daily - 2 sets - 10 reps - Seated Shoulder Abduction AAROM with Dowel  - 2 x daily - 2 sets - 10 reps - Seated Shoulder External Rotation AAROM with Cane and Hand in Neutral  - 2 x daily - 2 sets - 10 reps   GOALS: Goals reviewed with patient? Yes  SHORT TERM GOALS: Target date: 01/18/24  Pt will be independent in ROM and coordination HEP. Baseline: decreased ROM, strength, and coordination of dominant UE Goal status: in progress  2.  Pt will verbalize understanding of task modifications and/or potential A/E needs to increase ease, safety, and independence w/ ADLs. Baseline: difficulty with tying shoes, buttoning buttons, putting on jewelry and makeup Goal status:  in progress  3.  Pt will demonstrate improved fine motor coordination for ADLs as evidenced by decreasing 9 hole peg test score for RUE by 3 secs Baseline: 9  Hole Peg test: Right: 47.15 sec; Left: 42.72 sec 01/18/24: Right: 35.81 sec; Left: 34.62 sec Goal status:  MET  4.  Pt  will verbalizing understanding of typing programs and/or adaptive techniques to allow for increased ease with computer use (voice to text). Baseline:  01/18/24: instructed on typing websites with pt demonstrating good hand placement with decreasing errors with focus on one word at a time Goal status:  in progress   LONG TERM GOALS: Target date: 02/08/23  Pt will demonstrate improved ease with fastening buttons as evidenced by decreasing 3 button/ unbutton time to 35 seconds or less with use of AE PRN. Baseline: 58 sec 02/06/24: 30.25 sec Goal status: MET  2.  Pt will write a short paragraph with 100% legibility and no significant decrease in quality of writing. Baseline: decreased quality from start to finish Goal status:  in progress  3.  Pt will demonstrate improved fine motor coordination for ADLs as evidenced by decreasing 9 hole peg test score for RUE by 6 secs Baseline: 9 Hole Peg test: Right: 47.15 sec; Left: 42.72 sec 02/06/24: 39.78 sec Goal status:  MET  4.  Pt will demonstrate ability to retrieve a lightweight object at 110* shoulder flexion for increased ROM as needed for ADLs and IADLs. Baseline: 88* Goal status:  in progress  5.  Patient will report at least two-point increase in average PSFS score or at least three-point increase in a single activity score indicating functionally significant improvement given minimum detectable change. Baseline: 3.0 Goal status:  in progress  ASSESSMENT:  CLINICAL IMPRESSION: Patient is a 89 y.o. female who was seen today for occupational therapy treatment for ongoing RUE weakness s/p CVA in January 2025.  Pt demonstrating improvements in coordination and clothing fasteners both with and without AE.  Pt educated on use of home blood pressure cuff and proper technique and placement for most consistent readings.  Pt also  educated on recommendation to f/u with PCP vs use of [facility] or urgent care if more emergent concerns arise as pt reports not being able to get in with PCP on short notice. Pt will continue to benefit from skilled occupational therapy services to address strength and coordination, ROM, pain management, balance, GM/FM control, safety awareness, introduction of compensatory strategies/AE prn, and implementation of an HEP to improve participation and safety during ADLs and IADLs.    PERFORMANCE DEFICITS: in functional skills including ADLs, IADLs, coordination, ROM, strength, pain, Fine motor control, Gross motor control, body mechanics, decreased knowledge of precautions, decreased knowledge of use of DME, and UE functional use and psychosocial skills including coping strategies, environmental adaptation, and routines and behaviors.      PLAN:  OT FREQUENCY: 2x/week  OT DURATION: 6 weeks  PLANNED INTERVENTIONS: 97168 OT Re-evaluation, 97535 self care/ADL training, 02889 therapeutic exercise, 97530 therapeutic activity, 97112 neuromuscular re-education, 97035 ultrasound, 97032 electrical stimulation (manual), passive range of motion, energy conservation, coping strategies training, patient/family education, and DME and/or AE instructions  RECOMMENDED OTHER SERVICES: NA  CONSULTED AND AGREED WITH PLAN OF CARE: Patient  PLAN FOR NEXT SESSION: f/u golf solitaire RUE ROM activities/exercises Coordination activities F/u obtaining button hook    Renay Crammer, OTR/L 02/06/2024, 1:14 PM   [facility] at [facility] [address], [address] [place], [place] [address] Phone # [phone] Fax # [phone]         "

## 2024-02-06 NOTE — Therapy (Signed)
 " OUTPATIENT SPEECH LANGUAGE PATHOLOGY APHASIA TREATMENT   Patient Name: Lynn Ray MRN: [id number] DOB:[birth year], 89 y.o., female Today's Date: 02/06/2024  PCP: Rollene Norris, MD REFERRING PROVIDER: same as PCP  END OF SESSION:  End of Session - 02/06/24 1419     Visit Number 6    Number of Visits 9    Date for Recertification  02/22/24    Authorization Type [employer] m-[employer]    SLP Start Time 1410    SLP Stop Time  1447    SLP Time Calculation (min) 37 min    Activity Tolerance Patient tolerated treatment well           Past Medical History:  Diagnosis Date   Arthritis    Cancer (HCC) 2004   rectal cancer   Cataract    right eye   Chiari malformation    CKD (chronic kidney disease), stage IV (HCC)    Depression    Diabetes mellitus (HCC)    pre   Hypertension    Sleep apnea    no CPAP   Thyroid  disease    hyporthyroid   Past Surgical History:  Procedure Laterality Date   CATARACT EXTRACTION Bilateral    chiari malformation  2013   surgery   COLONOSCOPY     FLEXIBLE SIGMOIDOSCOPY     POLYPECTOMY     Patient Active Problem List   Diagnosis Date Noted   Sinus pause 07/06/2023   Aphasia, late effect of cerebrovascular disease 04/27/2023   Hx of completed stroke 03/16/2023   Anemia 02/23/2023   Vision changes 04/18/2022   Pre-diabetes 09/29/2020   Stage 4 chronic kidney disease (HCC) 12/22/2019   History of Chiari malformation 10/09/2017   Chronic right shoulder pain 10/09/2017   Routine general medical examination at a health care facility 07/13/2017   History of malignant neoplasm of large intestine 11/18/2010   BARRETT'S ESOPHAGUS 01/14/2008   Hypothyroidism 12/10/2007   Morbid obesity (HCC) 12/10/2007   Essential hypertension 12/10/2007   AORTIC VALVE DISORDERS 12/10/2007   COPD 12/10/2007   GERD 12/09/2007    ONSET DATE: 02/22/23   REFERRING DIAG: Aphasia  THERAPY DIAG:  Aphasia  Rationale for Evaluation and Treatment:  Rehabilitation  SUBJECTIVE:   SUBJECTIVE STATEMENT: I hardly have much trouble with my words now.  Pt accompanied by: self  PERTINENT HISTORY: Rectal CA, chiari malformation s/p surgery, CKD IV, depression, DM, HTN,  PAIN:  Are you having pain? Yes: NPRS scale: 3/10 Pain location: bil knees Pain description: soreness Aggravating factors: climbing stairs Relieving factors: resting  FALLS: Has patient fallen in last 6 months?  Number of falls: 1; See PT eval.   PATIENT GOALS: Improve ability to speak like she used to prior to CVA  OBJECTIVE:  Note: Objective measures were completed at Evaluation unless otherwise noted.  DIAGNOSTIC FINDINGS:  MRI- 02/23/23 IMPRESSION: 1. Patchy acute ischemic infarct involving the parasagittal left frontal lobe. Minimal associated petechial blood products without hemorrhagic transformation or significant mass effect. 2. Additional subcentimeter acute to early subacute ischemic nonhemorrhagic infarcts involving the subcortical right frontal lobe and superior left thalamus. 3. Underlying age-related cerebral atrophy with chronic microvascular ischemic disease, with a few scattered remote lacunar infarcts about the hemispheric cerebral white matter and cerebellum.  ST Discharge from CIR 03/07/23 Clinical Impression/Discharge Summary:  Pt has made good gains and has met 2 of 3 LTG's this admission due to improved cognition. Pt is currently an overall supervisionA for cognitive tasks and requires min cues  for utilization of word finding compensatory strategies during conversation. Pt/family education complete and pt will discharge home with supervision from friends/family/etc. Pt would benefit from f/u ST services to maximize communication in order to maximize functional independence.      PATIENT REPORTED OUTCOME MEASURES (PROM): Communication Participation Item Bank: (CPIB); Pt scored herself 18/30 with lower scores indicating greater effect of  pt's deficits on her communication participation.                                                                                                                             TREATMENT DATE:   02/06/24: Today Malya scored herself 21/30 on the CPIB. She scored 0 (speech interferes all the time) with talking in a small group.  Pt told SLP the modifications she could use to feel no pressure to pray quickly spoken about last session, and also to minimize difficulty with word finding in prayers discussed two sessions ago. Pt asked SLP about how she could assist people in her church to learn more about CVA and aphasia and SLP shared ideas with pt. One anomic event today in 21 minutes of conversation. SLP and pt discussed follow up ST and pt and SLP agreed next session could be pt's last session due to progress.   01/28/24: Pt needs CPIB next session. She completed naming (synonym) task today with 85% success. Pt wrote down words for prayer meeting but did not pray due to other women praying very quickly. SLP assisted pt in problem solving for how she could cont to pray without feeling pressure of time/rate; SLP and pt agreed that  pt could ask to begin or end prayer time so as not to feel pressure of the rate of speech/ speed of her prayer. SLP asked about CVA/aphasia info SLP provided and pt stated she was going to provide this to her family for education about aphasia and CVA. Today's 7-minute mod complex conversation was completed with WNL fluidity.  01/22/24: Pt has not written down any words for prayer meeting yet. She has meeting tonight and told SLP that she feels she isn't a part of the group anymore. SLP and pt generated words related to possible prayer terms (names of God and Jesus and adjectives describing them), and topics her prayer group prays for. She req'd min-mod A, occasionally to generate these. SLP told pt to review these out loud a couple times prior to prayer group tonight. Pt thanked SLP  for the session today.   01/18/24: Pt used SLP's suggestion from previous session and wrote out some words for prayer group. SLP also encouraged pt to bring her prayer journal to prayer group. Today SLP reiterated pt complete SFA modified for prayer terms; pt told SLP she would complete this. SLP assisted pt with homework she could not completed due to complexity, and she req'd min-mod cues usually with these entries. SLP also provided extra homework for anomia and sentence composition.  Pt was concerned  about her family not understanding her aphasia so SLP offered a printout from [employer].   01/10/24: Pt notes that she experiences word-finding difficulties during prayer group meetings, when she gives a prayer. Pt experiences anomic pauses during prayers, which is different from prior to stroke. SLP introduced Primary School Teacher (SFA) as a strategy for optimizing word-retrieval. SLP duided pt through an example of SFA using a picture of an object. After guided practice, pt provided 6+ descriptions in 7 Trials of SFA given frequent mod to min A. SLP educated pt in using personally-relevant words for SFA training (prayer-related terminology). Pt is agreeable and open to finding relevant words for helping with verbal prayer generation. SLP instructed pt in planning prayers before prayer group meetings to maximize confidence and minimize anomic pauses during prayer generation. SLP provided pt with SFA homework with up to 15 different objects to describe. SLP also encouraged pt to find personally relevant words related to prayer and her religion to utilize in future practice of word-finding strategies. Plan is to continue word-finding strategies for maximizing pt participation in social ADLs and to increase QOL for communication.  12/25/23: N/A. Pt needs homework tracking sheet in first ST session.  PATIENT EDUCATION: Education details: eval results, possible goals, not-as-robust  prognosis for improvement based upon pt age, and time post onset but SLP believes pt will make improvement to some degree Person educated: Patient Education method: Explanation Education comprehension: verbalized understanding   GOALS: Goals reviewed with patient? Yes  SHORT TERM GOALS: Target date:  01/28/24 (due to visit #)  Pt will improve CPIB to at least 21 at visit #5 Baseline: Goal status: met  2.  Pt will perform naming tasks using compensations PRN with 90% success in 2 sessions Baseline:  Goal status: partially met  3.  Pt will demo evidence of completing homework for anomia/aphasia between three sessions Baseline: 01/18/24, 01/28/24 Goal status: partially met  4.  Pt will participate in mod complex conversation for 5 minutes with <4 anomic pauses Baseline:  Goal status: Met   LONG TERM GOALS: Target date: 02/22/24  At visit #9, pt will improve CPIB score to higher than the score noted for STG #1 Baseline:  Goal status: ONGOING  2.  Pt will complete homework for anomia/aphasia at least 3 days/week after 01/25/24, as tallied by tracking sheet Baseline:  Goal status: ONGOING  3.  Pt will participate in mod complex-complex conversation for total 10 minutes with no more than 5 anomic pauses, using compensations PRN Baseline:  Goal status: ONGOING   ASSESSMENT:  CLINICAL IMPRESSION: Pt completed CPIB this session  -STG#1 met. Patient is a 89 y.o. F who was seen today for treatment of speech and language following a CVA in January 2025. See treatment date above for today's date for further details on today's session. On eval date she scored 23/30 on the odds administration of the [employer], which is below WNL (mean=27.13, standard deviation=2.06). She tells SLP that she has a lot of difficulty in conversations: with fast-moving topics, with strangers, when she needs to persuade a family member or friend to see a different point of view, or conversations  needing to communicate something quickly. She is highly motivated for positive change. She and SLP agreed next session can be pt's last as she is satisfied with progress and wants to verify progress over time.  OBJECTIVE IMPAIRMENTS: include aphasia. These impairments are limiting patient from effectively communicating at home and in community. Factors affecting  potential to achieve goals and functional outcome are family/community support (possibly - pt arrived alone today). Patient will benefit from skilled SLP services to address above impairments and improve overall function.  REHAB POTENTIAL: Good  PLAN:  SLP FREQUENCY: 1x/week  SLP DURATION: 8 weeks  PLANNED INTERVENTIONS: Language facilitation, Environmental controls, Cueing hierachy, Internal/external aids, Functional tasks, Multimodal communication approach, SLP instruction and feedback, Compensatory strategies, Patient/family education, and [id number] Treatment of speech (30 or 45 min)     [employer], CF-SLP 02/06/2024, 2:20 PM  Referring diagnosis:  Aphasia R47.01 Treatment diagnosis (if different than referring diagnosis): same Date Symptoms Began: Jan 2025 # of Visits requested: 12  Time period for Authorization: 12/25/23 to 02/22/24  What was this (referring dx) caused by? []  Surgery []  Fall []  Ongoing issue []  Arthritis [x]  Other: ___CVA_________  Laterality: []  Rt   NA []  Lt []  Both  Functional Tool & Score: [place] Naming Test 23/20  Check all possible CPT codes:     See Planned Interventions listed in the Plan section of the Evaluation.     If [employer]: Choose 10 or less codes  If [employer]: Modalities are not covered  If [employer]: Check allowed ICD code combinations   If [facility] Plan or Cigna: Cognitive training not covered     "

## 2024-02-09 LAB — LAB REPORT - SCANNED
Albumin, Urine POC: 156.4
Creatinine, POC: 88.9 mg/dL
EGFR: 22
Microalb Creat Ratio: 176

## 2024-02-11 ENCOUNTER — Ambulatory Visit: Admitting: Occupational Therapy

## 2024-02-11 DIAGNOSIS — M6281 Muscle weakness (generalized): Secondary | ICD-10-CM

## 2024-02-11 DIAGNOSIS — R278 Other lack of coordination: Secondary | ICD-10-CM

## 2024-02-11 DIAGNOSIS — I69351 Hemiplegia and hemiparesis following cerebral infarction affecting right dominant side: Secondary | ICD-10-CM

## 2024-02-11 NOTE — Therapy (Signed)
 " OUTPATIENT OCCUPATIONAL THERAPY NEURO  Treatment Note & Discharge  Patient Name: Lynn Ray MRN: [id number] DOB:[birth year], 89 y.o., female Today's Date: 02/11/2024  PCP: Rollene Almarie LABOR, MD REFERRING PROVIDER: Rollene Almarie LABOR, MD  OCCUPATIONAL THERAPY DISCHARGE SUMMARY  Visits from Start of Care: 9  Current functional level related to goals / functional outcomes: Pt met 4 of 5 LTGs and is demonstrating improvements in RUE shoulder ROM as well as improvements in coordination with handwriting, typing, and clothing fasteners.  Pt purchased button hook to aid in buttons and magnetic fasteners for jewelry.  Pt continues to work on coordination HEP and is completing typing program for increased ease with typing.     Remaining deficits: Decreased R shoulder ROM, however pt reporting that she had shoulder issues prior to the stroke.   Education / Equipment: Patent Attorney, typing and handwriting programs and practice, AE to aid in clothing fasteners   Patient agrees to discharge. Patient goals were met. Patient is being discharged due to being pleased with the current functional level..     END OF SESSION:  OT End of Session - 02/11/24 1452     Visit Number 9    Number of Visits 13    Date for Recertification  02/15/23    Authorization Type Auth#: [id number] , approved 13 OT visits from 12/25/2023-03/24/2024.**  [employer] 2025    Authorization - Number of Visits 13    Progress Note Due on Visit 10    OT Start Time 1448    OT Stop Time 1530    OT Time Calculation (min) 42 min    Equipment Utilized During Treatment cards, jenga    Activity Tolerance Patient tolerated treatment well    Behavior During Therapy WFL for tasks assessed/performed                Past Medical History:  Diagnosis Date   Arthritis    Cancer (HCC) 2004   rectal cancer   Cataract    right eye   Chiari malformation    CKD (chronic kidney disease), stage IV (HCC)     Depression    Diabetes mellitus (HCC)    pre   Hypertension    Sleep apnea    no CPAP   Thyroid  disease    hyporthyroid   Past Surgical History:  Procedure Laterality Date   CATARACT EXTRACTION Bilateral    chiari malformation  2013   surgery   COLONOSCOPY     FLEXIBLE SIGMOIDOSCOPY     POLYPECTOMY     Patient Active Problem List   Diagnosis Date Noted   Sinus pause 07/06/2023   Aphasia, late effect of cerebrovascular disease 04/27/2023   Hx of completed stroke 03/16/2023   Anemia 02/23/2023   Vision changes 04/18/2022   Pre-diabetes 09/29/2020   Stage 4 chronic kidney disease (HCC) 12/22/2019   History of Chiari malformation 10/09/2017   Chronic right shoulder pain 10/09/2017   Routine general medical examination at a health care facility 07/13/2017   History of malignant neoplasm of large intestine 11/18/2010   BARRETT'S ESOPHAGUS 01/14/2008   Hypothyroidism 12/10/2007   Morbid obesity (HCC) 12/10/2007   Essential hypertension 12/10/2007   AORTIC VALVE DISORDERS 12/10/2007   COPD 12/10/2007   GERD 12/09/2007    ONSET DATE: referral date 11/26/23 (CVA 01/2023)  REFERRING DIAG: R29.898 (ICD-10-CM) - Hand weakness  THERAPY DIAG:  Hemiplegia and hemiparesis following cerebral infarction affecting right dominant side (HCC) - Plan: Ot plan of care  cert/re-cert  Other lack of coordination - Plan: Ot plan of care cert/re-cert  Muscle weakness (generalized) - Plan: Ot plan of care cert/re-cert  Rationale for Evaluation and Treatment: Rehabilitation  SUBJECTIVE:   SUBJECTIVE STATEMENT: Pt reports that she is recognizing improvements in her R hand with typing and other tasks.    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 No   FALLS: Has patient fallen in last 6 months?  No  LIVING ENVIRONMENT: Lives with: lives alone (has healthcare aids that stay with her overnight) Lives in: House/apartment Stairs: 2 steps to enter ; split level home; bedroom on 2nd floor - 6 steps to access Has following equipment at home: Single point cane, shower chair, Grab bars, and handicap height toilet seats  PLOF: Independent, Independent with basic ADLs, and Leisure: member of a few local organizations, enjoy crafting  PATIENT GOALS: to be able to use dominant UE more independently and resume crafts  OBJECTIVE:  Note: Objective measures were completed at Evaluation unless otherwise noted.  HAND DOMINANCE: Right  ADLs: Overall ADLs: reports increased time to get dressed Transfers/ambulation related to ADLs: utilizing [facility] Eating: some difficulty with scooping and cutting foods Grooming: enjoys wearing makeup, requiring increased time UB Dressing: difficulty with buttons, zipping dress zippers (in back) LB Dressing: does have difficulty with tying shoes but primarily wearing slip on shoes Toileting: Mod I Bathing: Mod I Tub Shower transfers: increased time and effort when exiting bathtub/shower Equipment: Shower seat with back and Grab bars  IADLs:  Light housekeeping: healthcare aids help with mopping, cleaning bathroom, taking out the trash Meal Prep: minimal cooking, will cook soups or bring in food Community mobility: still driving, however not driving at night time Medication management: utilizing pill box  Financial management: daughter is completing Handwriting: 100% legible and Increased time - requiring 28.19 sec to write [place].  MOBILITY STATUS: Needs Assist: Utilizing [facility] for mobility  POSTURE COMMENTS:  rounded shoulders and forward head  ACTIVITY TOLERANCE: Activity tolerance: diminished  FUNCTIONAL OUTCOME MEASURES: PSFS: 3.0    02/11/24 PSFS: 7.8    UPPER EXTREMITY ROM:    Active ROM Right eval Left eval  Right 02/11/24  Shoulder flexion 88 WFL 104  Shoulder abduction     Shoulder adduction     Shoulder extension     Shoulder internal rotation Able to reach to back pocket, reports mild tingling at end range [facility]  Shoulder external rotation WFL (reports tingling at end range) [facility]  Elbow flexion     Elbow extension     Wrist flexion     Wrist extension     Wrist ulnar deviation     Wrist radial deviation     Wrist pronation     Wrist supination     (Blank rows = not tested)  UPPER EXTREMITY MMT:     MMT Right eval Left eval  Shoulder flexion 4+ 3-  Shoulder abduction    Shoulder adduction    Shoulder extension    Shoulder internal rotation    Shoulder external rotation    Middle trapezius    Lower trapezius    Elbow flexion    Elbow extension    Wrist flexion    Wrist extension    Wrist ulnar deviation  Wrist radial deviation    Wrist pronation    Wrist supination    (Blank rows = not tested)  HAND FUNCTION: Grip strength: Right: 29 lbs; Left: 36 lbs  COORDINATION: 9 Hole Peg test: Right: 47.15 sec; Left: 42.72 sec Box and Blocks:  Right 35 blocks, Left 40 blocks 3 button/unbutton: 58.79 sec   02/06/24:  3 button/unbutton: 30.25 sec 9 hole peg test: Right: 39.78 sec  SENSATION: WFL  EDEMA: NA   COGNITION: Overall cognitive status: Within functional limits for tasks assessed  VISION: Subjective report: no changes Baseline vision: Wears glasses all the time  VISION ASSESSMENT: Not tested                                                                                                                             TREATMENT DATE:  02/11/24 Assessed goals see objective and goal sections (above and below) for further details.  Pt feeling pleased with current progress and recognizing need to continue on with her HEP and typing at home.   Handwriting: pt writing 3-4 sentence paragraph with 100% legibility.  Pt reporting handwriting somewhat smaller  today than normal, however stayed consistent and legible from start to finish.  Pt handwriting yielding increased legibility with slightly weighted pen compared to standard pen.  Pt writing additional sentence with improved sizing, maintaining legibility throughout.   Pipe tree puzzle: engaged in pipe tree puzzle in sitting to focus on increased shoulder flexion and coordination and manipulation of pieces.  Pt demonstrating good functional use of RUE when picking up and manipulating pieces.  Pt demonstrating good shoulder flexion with no c/o pain.   02/06/24 Self-care: Pt reporting bringing in personal BP machine and wanting to learn how to use it.  OT educated pt on proper setup and fit of home blood pressure machine.  Due to larger circumference upper arm, educated on both proper fit at upper arm if having assistance from another person or the potential use at forearm.  OT educating on tendency for elevated BP in forearm compared to upper arm. Buttons: pt completing buttons with and without use of button hooks.  Pt demonstrating improved timing without button hook, however pt still interested in potential benefit of button hook for home especially one with zipper aid. 9 hole peg test: 39.78 sec with right hand   01/30/24  - Self-care/home management completed for duration as noted below including: Educated pt in use of button hook for improved ease in dressing. Pt completed buttoning of dress shirt for practice, pt reports that's a lot easier. Educated in where to obtain AE.  Educated pt in where to obtain magnetic clasps for improved ease in donning/doffing jewelry. - Therapeutic activities completed for duration as noted below including: Patient used R hand to assemble small Jenga tower, and then removed 15 pieces individually until the tower fell over for fine motor coordination of affected extremity. Educated pt in manufacturing systems engineer for working RUE FM coordination  further, handout provided with  pictorial and written instruction. Pt demonstrated understanding. - Therapeutic exercises completed for duration as noted below including: Pt educated in shoulder flexion gravity eliminated with washcloth and shoulder abduction gravity eliminated with washcloth to improve RUE ROM.    PATIENT EDUCATION: Education details: coordination, handwriting Person educated: Patient Education method: Explanation, Demonstration, and Handouts Education comprehension: verbalized understanding and needs further education  HOME EXERCISE PROGRAM: Coordination HEP - See pt instructions  Access Code: [email or web address] URL: [email or web address] Date: 01/16/2024 Prepared by: [facility] - Outpatient  Rehab - [facility]  Exercises - Seated Shoulder Blade Squeeze  - 2 x daily - 2 sets - 10 reps - Shoulder Flexion Overhead with Dowel  - 2 x daily - 2 sets - 10 reps - Seated Shoulder Flexion AAROM with Dowel  - 2 x daily - 2 sets - 10 reps - Seated Shoulder Abduction AAROM with Dowel  - 2 x daily - 2 sets - 10 reps - Seated Shoulder External Rotation AAROM with Cane and Hand in Neutral  - 2 x daily - 2 sets - 10 reps   GOALS: Goals reviewed with patient? Yes  SHORT TERM GOALS: Target date: 01/18/24  Pt will be independent in ROM and coordination HEP. Baseline: decreased ROM, strength, and coordination of dominant UE Goal status: in progress  2.  Pt will verbalize understanding of task modifications and/or potential A/E needs to increase ease, safety, and independence w/ ADLs. Baseline: difficulty with tying shoes, buttoning buttons, putting on jewelry and makeup Goal status:  in progress  3.  Pt will demonstrate improved fine motor coordination for ADLs as evidenced by decreasing 9 hole peg test score for RUE by 3 secs Baseline: 9 Hole Peg test: Right: 47.15 sec; Left: 42.72 sec 01/18/24: Right: 35.81 sec; Left: 34.62 sec Goal status:  MET  4.  Pt will verbalizing understanding of typing  programs and/or adaptive techniques to allow for increased ease with computer use (voice to text). Baseline:  01/18/24: instructed on typing websites with pt demonstrating good hand placement with decreasing errors with focus on one word at a time Goal status:  in progress   LONG TERM GOALS: Target date: 02/08/23  Pt will demonstrate improved ease with fastening buttons as evidenced by decreasing 3 button/ unbutton time to 35 seconds or less with use of AE PRN. Baseline: 58 sec 02/06/24: 30.25 sec Goal status: MET  2.  Pt will write a short paragraph with 100% legibility and no significant decrease in quality of writing. Baseline: decreased quality from start to finish 02/11/24: consistent legibility, improves with weighted pen Goal status:  MET  3.  Pt will demonstrate improved fine motor coordination for ADLs as evidenced by decreasing 9 hole peg test score for RUE by 6 secs Baseline: 9 Hole Peg test: Right: 47.15 sec; Left: 42.72 sec 02/06/24: 39.78 sec Goal status:  MET  4.  Pt will demonstrate ability to retrieve a lightweight object at 110* shoulder flexion for increased ROM as needed for ADLs and IADLs. Baseline: 88* 02/11/24: 104* Goal status:  NOT MET  5.  Patient will report at least two-point increase in average PSFS score or at least three-point increase in a single activity score indicating functionally significant improvement given minimum detectable change. Baseline: 3.0 02/11/24: 7.8 Goal status:  MET  ASSESSMENT:  CLINICAL IMPRESSION: Patient is a 89 y.o. female who was seen today for occupational therapy treatment for ongoing RUE weakness s/p CVA in January 2025.  Pt  demonstrating improvements in coordination and clothing fasteners both with and without AE, reporting received magnetic fasteners for jewelry and button hook to aid in clothing fasteners since last visit.  Pt reports and demonstrates improvements in typing and handwriting and pleased with progress.  Pt  reporting that she is able to continue with HEP at home and is agreeable to d/c at this time.   PERFORMANCE DEFICITS: in functional skills including ADLs, IADLs, coordination, ROM, strength, pain, Fine motor control, Gross motor control, body mechanics, decreased knowledge of precautions, decreased knowledge of use of DME, and UE functional use and psychosocial skills including coping strategies, environmental adaptation, and routines and behaviors.      PLAN:  OT FREQUENCY: 2x/week  OT DURATION: 6 weeks  PLANNED INTERVENTIONS: 97168 OT Re-evaluation, 97535 self care/ADL training, 02889 therapeutic exercise, 97530 therapeutic activity, 97112 neuromuscular re-education, 97035 ultrasound, [id number] electrical stimulation (manual), passive range of motion, energy conservation, coping strategies training, patient/family education, and DME and/or AE instructions  RECOMMENDED OTHER SERVICES: NA  CONSULTED AND AGREED WITH PLAN OF CARE: Patient   KAYLENE DOMINO, OTR/L 02/11/2024, 4:26 PM   [facility] at [facility] [address] [place], [place] [address] Phone # [phone] Fax # [phone]         "

## 2024-02-13 ENCOUNTER — Ambulatory Visit: Admitting: Occupational Therapy

## 2024-02-13 ENCOUNTER — Ambulatory Visit

## 2024-02-18 ENCOUNTER — Ambulatory Visit

## 2024-02-18 DIAGNOSIS — R4701 Aphasia: Secondary | ICD-10-CM

## 2024-02-18 NOTE — Therapy (Signed)
 " OUTPATIENT SPEECH LANGUAGE PATHOLOGY APHASIA TREATMENT - D/C SUMMARY   Patient Name: Lynn Ray MRN: [id number] DOB:[birth year], 89 y.o., female Today's Date: 02/18/2024  PCP: Lynn Norris, MD REFERRING PROVIDER: same as PCP  END OF SESSION:  End of Session - 02/18/24 1407     Visit Number 7    Number of Visits 9    Date for Recertification  02/22/24    Authorization Type [employer] m-care    SLP Start Time 1404    SLP Stop Time  1445    SLP Time Calculation (min) 41 min    Activity Tolerance Patient tolerated treatment well            Past Medical History:  Diagnosis Date   Arthritis    Cancer (HCC) 2004   rectal cancer   Cataract    right eye   Chiari malformation    CKD (chronic kidney disease), stage IV (HCC)    Depression    Diabetes mellitus (HCC)    pre   Hypertension    Sleep apnea    no CPAP   Thyroid  disease    hyporthyroid   Past Surgical History:  Procedure Laterality Date   CATARACT EXTRACTION Bilateral    chiari malformation  2013   surgery   COLONOSCOPY     FLEXIBLE SIGMOIDOSCOPY     POLYPECTOMY     Patient Active Problem List   Diagnosis Date Noted   Sinus pause 07/06/2023   Aphasia, late effect of cerebrovascular disease 04/27/2023   Hx of completed stroke 03/16/2023   Anemia 02/23/2023   Vision changes 04/18/2022   Pre-diabetes 09/29/2020   Stage 4 chronic kidney disease (HCC) 12/22/2019   History of Chiari malformation 10/09/2017   Chronic right shoulder pain 10/09/2017   Routine general medical examination at a health care facility 07/13/2017   History of malignant neoplasm of large intestine 11/18/2010   BARRETT'S ESOPHAGUS 01/14/2008   Hypothyroidism 12/10/2007   Morbid obesity (HCC) 12/10/2007   Essential hypertension 12/10/2007   AORTIC VALVE DISORDERS 12/10/2007   COPD 12/10/2007   GERD 12/09/2007   SPEECH THERAPY DISCHARGE SUMMARY  Visits from Start of Care: 7  Current functional level related to goals /  functional outcomes: See below in STGs/LTGs. Pt improved considerably in her ability to produce desired language in conversation and voiced this success in her last two sessions.    Remaining deficits: Minimal expressive aphasia, rarely.    Education / Equipment: See individual therapy session notes.   Patient agrees to discharge. Patient goals were partially met. Patient is being discharged due to being pleased with the current functional level..     ONSET DATE: 02/22/23   REFERRING DIAG: Aphasia  THERAPY DIAG:  Aphasia  Rationale for Evaluation and Treatment: Rehabilitation  SUBJECTIVE:   SUBJECTIVE STATEMENT: I'm not forgetting my words as much as I was when I first came here.  Pt accompanied by: self  PERTINENT HISTORY: Rectal CA, chiari malformation s/p surgery, CKD IV, depression, DM, HTN,  PAIN:  Are you having pain? No  FALLS: Has patient fallen in last 6 months?  Number of falls: 1; See PT eval.   PATIENT GOALS: Improve ability to speak like she used to prior to CVA  OBJECTIVE:  Note: Objective measures were completed at Evaluation unless otherwise noted.  DIAGNOSTIC FINDINGS:  MRI- 02/23/23 IMPRESSION: 1. Patchy acute ischemic infarct involving the parasagittal left frontal lobe. Minimal associated petechial blood products without hemorrhagic transformation or significant mass effect.  2. Additional subcentimeter acute to early subacute ischemic nonhemorrhagic infarcts involving the subcortical right frontal lobe and superior left thalamus. 3. Underlying age-related cerebral atrophy with chronic microvascular ischemic disease, with a few scattered remote lacunar infarcts about the hemispheric cerebral white matter and cerebellum.  ST Discharge from CIR 03/07/23 Clinical Impression/Discharge Summary:  Pt has made good gains and has met 2 of 3 LTG's this admission due to improved cognition. Pt is currently an overall supervisionA for cognitive tasks and  requires min cues for utilization of word finding compensatory strategies during conversation. Pt/family education complete and pt will discharge home with supervision from friends/family/etc. Pt would benefit from f/u ST services to maximize communication in order to maximize functional independence.      PATIENT REPORTED OUTCOME MEASURES (PROM): Communication Participation Item Bank: (CPIB); Pt scored herself 18/30 with lower scores indicating greater effect of pt's deficits on her communication participation.                                                                                                                             TREATMENT DATE:   02/18/24: Pt is agreeable to d/c today. SLP reviewed anomia compensations (see pt instructions). SLP had pt read each compensation and rehearsed some of these strategies with pt. She stated she already used synonym strategy. She and SLP spoke for 15 minutes in mod complex-complex conversation about her therapy as well as conversations she wishes to have with people including her pastor - pt without anomia noted in this conversation.  Lastly, SLP had pt retest with the CPIB. She scored 21/30 again on this PROM, identical to 12 days ago.  02/06/24: Today Lynn Ray scored herself 21/30 on the CPIB. She scored 0 (speech interferes all the time) with talking in a small group.  Pt told SLP the modifications she could use to feel no pressure to pray quickly spoken about last session, and also to minimize difficulty with word finding in prayers discussed two sessions ago. Pt asked SLP about how she could assist people in her church to learn more about CVA and aphasia and SLP shared ideas with pt. One anomic event today in 21 minutes of conversation. SLP and pt discussed follow up ST and pt and SLP agreed next session could be pt's last session due to progress.   01/28/24: Pt needs CPIB next session. She completed naming (synonym) task today with 85% success. Pt  wrote down words for prayer meeting but did not pray due to other women praying very quickly. SLP assisted pt in problem solving for how she could cont to pray without feeling pressure of time/rate; SLP and pt agreed that  pt could ask to begin or end prayer time so as not to feel pressure of the rate of speech/ speed of her prayer. SLP asked about CVA/aphasia info SLP provided and pt stated she was going to provide this to her family for education about aphasia and CVA. Today's 7-minute  mod complex conversation was completed with WNL fluidity.  01/22/24: Pt has not written down any words for prayer meeting yet. She has meeting tonight and told SLP that she feels she isn't a part of the group anymore. SLP and pt generated words related to possible prayer terms (names of God and Jesus and adjectives describing them), and topics her prayer group prays for. She req'd min-mod A, occasionally to generate these. SLP told pt to review these out loud a couple times prior to prayer group tonight. Pt thanked SLP for the session today.   01/18/24: Pt used SLP's suggestion from previous session and wrote out some words for prayer group. SLP also encouraged pt to bring her prayer journal to prayer group. Today SLP reiterated pt complete SFA modified for prayer terms; pt told SLP she would complete this. SLP assisted pt with homework she could not completed due to complexity, and she req'd min-mod cues usually with these entries. SLP also provided extra homework for anomia and sentence composition.  Pt was concerned about her family not understanding her aphasia so SLP offered a printout from [employer].   01/10/24: Pt notes that she experiences word-finding difficulties during prayer group meetings, when she gives a prayer. Pt experiences anomic pauses during prayers, which is different from prior to stroke. SLP introduced Primary School Teacher (SFA) as a strategy for optimizing word-retrieval. SLP  duided pt through an example of SFA using a picture of an object. After guided practice, pt provided 6+ descriptions in 7 Trials of SFA given frequent mod to min A. SLP educated pt in using personally-relevant words for SFA training (prayer-related terminology). Pt is agreeable and open to finding relevant words for helping with verbal prayer generation. SLP instructed pt in planning prayers before prayer group meetings to maximize confidence and minimize anomic pauses during prayer generation. SLP provided pt with SFA homework with up to 15 different objects to describe. SLP also encouraged pt to find personally relevant words related to prayer and her religion to utilize in future practice of word-finding strategies. Plan is to continue word-finding strategies for maximizing pt participation in social ADLs and to increase QOL for communication.  12/25/23: N/A. Pt needs homework tracking sheet in first ST session.  PATIENT EDUCATION: Education details: eval results, possible goals, not-as-robust prognosis for improvement based upon pt age, and time post onset but SLP believes pt will make improvement to some degree Person educated: Patient Education method: Explanation Education comprehension: verbalized understanding   GOALS: Goals reviewed with patient? Yes  SHORT TERM GOALS: Target date:  01/28/24 (due to visit #)  Pt will improve CPIB to at least 21 at visit #5 Baseline: Goal status: met  2.  Pt will perform naming tasks using compensations PRN with 90% success in 2 sessions Baseline:  Goal status: partially met  3.  Pt will demo evidence of completing homework for anomia/aphasia between three sessions Baseline: 01/18/24, 01/28/24 Goal status: partially met  4.  Pt will participate in mod complex conversation for 5 minutes with <4 anomic pauses Baseline:  Goal status: Met   LONG TERM GOALS: Target date: 02/22/24  At visit #9, pt will improve CPIB score to higher than the score  noted for STG #1 Baseline:  Goal status: Not met (likely due to interim of 12 days)  2.  Pt will complete homework for anomia/aphasia at least 3 days/week after 01/25/24, as tallied by tracking sheet Baseline:  Goal status: deferred - conversation was effective homework  3.  Pt will participate in mod complex-complex conversation for total 10 minutes with no more than 5 anomic pauses, using compensations PRN Baseline:  Goal status: met   ASSESSMENT:  CLINICAL IMPRESSION: Patient is a 89 y.o. F who was seen today for her last treatment of speech and language following a CVA in January 2025. See treatment date above for today's date for further details on today's session. Lynn Ray states she is better in many more conversations now compared to prior to ST, and feels more comfortable in speaking situations she was uncomfortable in before. She is going to pray in her Thursday night prayer group this Thursday for the first time in a long long time because she is more comfortable with this now, with mod I (writing some key words for her prayers she would share in the group). She and SLP agreed for d/c today.  OBJECTIVE IMPAIRMENTS: include aphasia. These impairments are limiting patient from effectively communicating at home and in community. Factors affecting potential to achieve goals and functional outcome are family/community support (possibly - pt arrived alone today). Patient will benefit from skilled SLP services to address above impairments and improve overall function.  REHAB POTENTIAL: Good  PLAN:  SLP FREQUENCY: 1x/week  SLP DURATION: 8 weeks  PLANNED INTERVENTIONS: Language facilitation, Environmental controls, Cueing hierachy, Internal/external aids, Functional tasks, Multimodal communication approach, SLP instruction and feedback, Compensatory strategies, Patient/family education, and [id number] Treatment of speech (30 or 45 min)     [employer], CF-SLP 02/18/2024, 2:07  PM  Referring diagnosis:  Aphasia R47.01 Treatment diagnosis (if different than referring diagnosis): same Date Symptoms Began: Jan 2025 # of Visits requested: 12  Time period for Authorization: 12/25/23 to 02/22/24  What was this (referring dx) caused by? []  Surgery []  Fall []  Ongoing issue []  Arthritis [x]  Other: ___CVA_________  Laterality: []  Rt   NA []  Lt []  Both  Functional Tool & Score: [place] Naming Test 23/20  Check all possible CPT codes:     See Planned Interventions listed in the Plan section of the Evaluation.     If [employer]: Choose 10 or less codes  If [employer]: Modalities are not covered  If [employer]: Check allowed ICD code combinations   If [facility] Plan or [employer]: Cognitive training not covered     "

## 2024-02-18 NOTE — Patient Instructions (Addendum)

## 2024-04-04 ENCOUNTER — Ambulatory Visit: Admitting: Internal Medicine

## 2024-08-04 ENCOUNTER — Ambulatory Visit
